# Patient Record
Sex: Female | Born: 1940 | Race: Black or African American | Hispanic: No | Smoking: Former smoker
Health system: Southern US, Community
[De-identification: ages and names within clinical notes are randomized; demographics above are authoritative.]

## PROBLEM LIST (undated history)

## (undated) DIAGNOSIS — G4730 Sleep apnea, unspecified: Secondary | ICD-10-CM

## (undated) DIAGNOSIS — J449 Chronic obstructive pulmonary disease, unspecified: Secondary | ICD-10-CM

## (undated) DIAGNOSIS — I5022 Chronic systolic (congestive) heart failure: Secondary | ICD-10-CM

## (undated) DIAGNOSIS — I428 Other cardiomyopathies: Secondary | ICD-10-CM

## (undated) DIAGNOSIS — G4733 Obstructive sleep apnea (adult) (pediatric): Secondary | ICD-10-CM

## (undated) DIAGNOSIS — D649 Anemia, unspecified: Secondary | ICD-10-CM

## (undated) DIAGNOSIS — I2510 Atherosclerotic heart disease of native coronary artery without angina pectoris: Secondary | ICD-10-CM

## (undated) DIAGNOSIS — E119 Type 2 diabetes mellitus without complications: Secondary | ICD-10-CM

## (undated) DIAGNOSIS — N184 Chronic kidney disease, stage 4 (severe): Secondary | ICD-10-CM

## (undated) DIAGNOSIS — Z95810 Presence of automatic (implantable) cardiac defibrillator: Secondary | ICD-10-CM

## (undated) DIAGNOSIS — Z9989 Dependence on other enabling machines and devices: Secondary | ICD-10-CM

## (undated) DIAGNOSIS — I272 Pulmonary hypertension, unspecified: Secondary | ICD-10-CM

## (undated) DIAGNOSIS — I509 Heart failure, unspecified: Secondary | ICD-10-CM

## (undated) DIAGNOSIS — E1142 Type 2 diabetes mellitus with diabetic polyneuropathy: Secondary | ICD-10-CM

## (undated) DIAGNOSIS — R0902 Hypoxemia: Secondary | ICD-10-CM

## (undated) DIAGNOSIS — I639 Cerebral infarction, unspecified: Secondary | ICD-10-CM

## (undated) DIAGNOSIS — I10 Essential (primary) hypertension: Secondary | ICD-10-CM

## (undated) DIAGNOSIS — H269 Unspecified cataract: Secondary | ICD-10-CM

## (undated) DIAGNOSIS — R0600 Dyspnea, unspecified: Secondary | ICD-10-CM

## (undated) DIAGNOSIS — C20 Malignant neoplasm of rectum: Secondary | ICD-10-CM

## (undated) DIAGNOSIS — N3941 Urge incontinence: Secondary | ICD-10-CM

## (undated) HISTORY — PX: COLONOSCOPY: SHX174

## (undated) HISTORY — DX: Hypoxemia: R09.02

## (undated) HISTORY — DX: Atherosclerotic heart disease of native coronary artery without angina pectoris: I25.10

## (undated) HISTORY — DX: Unspecified cataract: H26.9

## (undated) HISTORY — DX: Dependence on other enabling machines and devices: Z99.89

## (undated) HISTORY — DX: Type 2 diabetes mellitus with diabetic polyneuropathy: E11.42

## (undated) HISTORY — DX: Chronic obstructive pulmonary disease, unspecified: J44.9

## (undated) HISTORY — DX: Presence of automatic (implantable) cardiac defibrillator: Z95.810

## (undated) HISTORY — PX: TUBAL LIGATION: SHX77

## (undated) HISTORY — DX: Type 2 diabetes mellitus without complications: E11.9

## (undated) HISTORY — DX: Cerebral infarction, unspecified: I63.9

## (undated) HISTORY — PX: TONSILLECTOMY AND ADENOIDECTOMY: SUR1326

## (undated) HISTORY — PX: UPPER GASTROINTESTINAL ENDOSCOPY: SHX188

## (undated) HISTORY — DX: Sleep apnea, unspecified: G47.30

## (undated) HISTORY — DX: Essential (primary) hypertension: I10

## (undated) HISTORY — DX: Anemia, unspecified: D64.9

## (undated) HISTORY — DX: Other cardiomyopathies: I42.8

## (undated) HISTORY — DX: Heart failure, unspecified: I50.9

## (undated) HISTORY — DX: Obstructive sleep apnea (adult) (pediatric): G47.33

## (undated) HISTORY — DX: Malignant neoplasm of rectum: C20

## (undated) HISTORY — DX: Pulmonary hypertension, unspecified: I27.20

## (undated) HISTORY — DX: Chronic kidney disease, stage 4 (severe): N18.4

## (undated) HISTORY — DX: Urge incontinence: N39.41

## (undated) HISTORY — DX: Chronic systolic (congestive) heart failure: I50.22

---

## 2007-06-20 ENCOUNTER — Ambulatory Visit: Payer: Self-pay | Admitting: Emergency Medicine

## 2007-06-20 ENCOUNTER — Inpatient Hospital Stay (HOSPITAL_COMMUNITY): Admission: AD | Admit: 2007-06-20 | Discharge: 2007-07-03 | Payer: Self-pay | Admitting: *Deleted

## 2007-06-27 ENCOUNTER — Encounter (INDEPENDENT_AMBULATORY_CARE_PROVIDER_SITE_OTHER): Payer: Self-pay | Admitting: *Deleted

## 2007-06-27 ENCOUNTER — Ambulatory Visit: Payer: Self-pay | Admitting: Vascular Surgery

## 2007-06-28 ENCOUNTER — Ambulatory Visit: Payer: Self-pay | Admitting: Physical Medicine & Rehabilitation

## 2007-06-28 ENCOUNTER — Encounter: Payer: Self-pay | Admitting: Emergency Medicine

## 2007-07-03 ENCOUNTER — Inpatient Hospital Stay (HOSPITAL_COMMUNITY): Admission: RE | Admit: 2007-07-03 | Discharge: 2007-07-08 | Payer: Self-pay | Admitting: *Deleted

## 2007-07-12 ENCOUNTER — Telehealth (INDEPENDENT_AMBULATORY_CARE_PROVIDER_SITE_OTHER): Payer: Self-pay | Admitting: *Deleted

## 2007-07-26 ENCOUNTER — Encounter: Payer: Self-pay | Admitting: Critical Care Medicine

## 2007-07-26 ENCOUNTER — Ambulatory Visit: Payer: Self-pay | Admitting: Family Medicine

## 2007-07-26 ENCOUNTER — Ambulatory Visit: Payer: Self-pay | Admitting: Pulmonary Disease

## 2007-07-26 DIAGNOSIS — I428 Other cardiomyopathies: Secondary | ICD-10-CM | POA: Insufficient documentation

## 2007-07-26 DIAGNOSIS — F3289 Other specified depressive episodes: Secondary | ICD-10-CM | POA: Insufficient documentation

## 2007-07-26 DIAGNOSIS — I10 Essential (primary) hypertension: Secondary | ICD-10-CM

## 2007-07-26 DIAGNOSIS — K766 Portal hypertension: Secondary | ICD-10-CM

## 2007-07-26 DIAGNOSIS — I2721 Secondary pulmonary arterial hypertension: Secondary | ICD-10-CM | POA: Insufficient documentation

## 2007-07-26 DIAGNOSIS — J449 Chronic obstructive pulmonary disease, unspecified: Secondary | ICD-10-CM

## 2007-07-26 DIAGNOSIS — F329 Major depressive disorder, single episode, unspecified: Secondary | ICD-10-CM

## 2007-08-11 ENCOUNTER — Encounter
Admission: RE | Admit: 2007-08-11 | Discharge: 2007-08-12 | Payer: Self-pay | Admitting: Physical Medicine & Rehabilitation

## 2007-08-11 ENCOUNTER — Ambulatory Visit: Payer: Self-pay | Admitting: Physical Medicine & Rehabilitation

## 2007-08-26 ENCOUNTER — Ambulatory Visit: Payer: Self-pay | Admitting: Critical Care Medicine

## 2007-08-29 ENCOUNTER — Ambulatory Visit: Payer: Self-pay | Admitting: Family Medicine

## 2007-09-02 ENCOUNTER — Ambulatory Visit: Payer: Self-pay | Admitting: Family Medicine

## 2007-10-27 ENCOUNTER — Encounter: Payer: Self-pay | Admitting: Critical Care Medicine

## 2009-11-28 HISTORY — PX: UPPER GI ENDOSCOPY: SHX6162

## 2010-01-13 LAB — HM DEXA SCAN

## 2010-08-10 ENCOUNTER — Encounter: Payer: Self-pay | Admitting: Physical Medicine & Rehabilitation

## 2010-12-02 NOTE — Assessment & Plan Note (Signed)
[name] is back regarding her lacunar stroke.  She was discharged from  [facility] on December 19 and has done extremely well.  She has a sleep  study scheduled at the beginning of next month.  She has seen her  cardiologist and family physician already who have given her good  reports thus far.  She denies any pain or weakness.  She is walking with  a cane just used occasionally for balance.  She is sleeping well.  Her  appetite has been good.  Mood has been excellent.  Sugars have been  under good control as has blood pressure.   REVIEW OF SYSTEMS:  All pertinent positives listed above.  Full review  is in the written health and history section.   SOCIAL HISTORY:  The patient is now living with her daughter here in  [place].  She will be staying here permanently it seems.  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   PHYSICAL EXAMINATION:  Blood pressure:  145/63.  Pulse:  56.  Respiratory rate:  18.  She is sating 94% on room air.  Patient is  pleasant, alert and oriented x3.  Affect is bright and appropriate.  HEART:  Regular.  CHEST:  Clear.  ABDOMEN:  Soft, nontender.  She has a cane with her, but does not need it for balance.  She has good  walking balance as well as standing balance today.  Strength was equal  at 5/5 on both sides.  Coordination was excellent and within normal  limits bilaterally.  Sensory exam is intact.  She has good finger to  nose and rapid alternating movement of both hands.  SKIN:  Intact.   ASSESSMENT:  1. Lacunar stroke.  2. Congestive heart failure.  3. Diabetes.  4. Pulmonary hypertension and questionable sleep apnea.  5. Depression.  6. Hypertension.   PLAN:  1. Patient has done extremely well from a rehabilitation standpoint.      I have nothing new to offer her today as she is walking essentially      without assistive device.  She has no pain and feels that her      physical and mental status are  essentially at their baseline.  I      recommended continued exercise and healthy living habits.  She      would do      herself a lot of good by volunteering, as I think this would be      satisfying for her.  2. I will see her back as needed in the future.      Meredith Staggers, M.D.  Electronically Signed     ZTS/MedQ  D:  08/12/2007 13:21:31  T:  08/12/2007 15:13:40  Job #:  [id number]   cc:   Jill Alexanders, M.D.  Fax: [phone]   Octavia Heir, MD  Fax: [phone]

## 2010-12-02 NOTE — Discharge Summary (Signed)
NAMECLARIS, [name]               ACCOUNT NO.:  [id number]   MEDICAL RECORD NO.:  [id number]          PATIENT TYPE:  IPS   LOCATION:  9563                         FACILITY:  [facility]   PHYSICIAN:  Meredith Staggers, M.D.DATE OF BIRTH:  03-14-1941   DATE OF ADMISSION:  07/03/2007  DATE OF DISCHARGE:  07/08/2007                               DISCHARGE SUMMARY   DISCHARGE DIAGNOSIS:  1. Lacuna stroke.  2. Congestive heart failure compensated.  3. Diabetes mellitus type 2.  4. Cardiomyopathy.  5. Acute on chronic renal insufficiency.  6. Depression.  7. Hypertension.  8. Pulmonary hypertension.  [name]   HISTORY OF PRESENT ILLNESS:  [name] is a 70 year old female with a  history of hypertension and dilated nonischemic cardiomyopathy admitted  via outside hospital on December 1 for treatment of shortness of breath,  lower extremity edema due to CHF.  She was treated with IV diuretics  for acute and chronic CHF.  The patient was noted to have fluctuating  mental status with intermittent slurred speech and neuro was consulted  for input on December 5.  CT of head done showed no acute changes and  neuro felt patient with lacuna CVA and recommended Ecotrin for CVA  prophylaxis and Foltx for hyperhomocysteinemia. A swallow eval done  showed delay in swallowing.  The patient was placed on a regular diet  and nectar-thick liquids. A 2-D echo done showed mild aortic sclerosis,  normal EF and right ventricle to be mild to moderately dilated with  moderate to severe TVR.  Cardiac cath done on December 11 revealed  severe pulmonary hypertension.  The patient was noted to continue with  hypoxia and Dr. Joya Gaskins questioned obstructive sleep apnea in addition to  pulmonary hypertension being a component to her continued hypoxia. A  sleep study is recommended past discharge.  Currently PT, OT is ongoing.  The patient is noted to have problems with decreased control, noted to  have a narrow base of  support with scissoring with ambulation.  Rehab  consulted for further therapies.   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   ALLERGIES:  TALWIN.   FAMILY HISTORY:  Positive for CVA and CAD.   SOCIAL HISTORY:  The patient lives in [place], is a retired Visual merchandiser.  She quit tobacco in 2005, uses alcohol rarely. Plans are to  discharge to daughter's home in [place] that is a one-level home with  no steps at entry.  The patient was independent however, had decreased  endurance levels prior to admission.   HOSPITAL COURSE:  [name] was admitted to rehab on July 03, 2007 for inpatient therapies to consist of PT, OT daily.  At the  time of admission, she was noted to have generalized weakness with slow  precise speech.  Foley was discontinued prior to admission and the  patient was noted to be voiding without signs of retention.  UA, UC was  sent out and the patient was noted to  have bacteria.  She was recorded  problems with frequency, urgency and was started on amoxicillin  empirically.  Urine culture was negative.  Secondary to the patient's  symptoms, she was treated with 5-day course of antibiotic and short  course of peridium.  Overall urgency, frequency symptoms resolved.  Check of CBC past admission revealed hemoglobin 8.7, hematocrits 26.8,  white count 5.3, platelets 330.  Check of lytes revealed sodium 137,  potassium 4.1, chloride 97, CO2 29, BUN 21, creatinine 1.55, glucose  113.  Alkaline phosphatase noted to be elevated 98.  Iron studies done  revealed iron low at 43, TIBC at 310, folate greater than 20, vitamin  B12 at 868, ferritin 275. Retic at 1.4.  Repeat check of lytes revealed  some renal insufficiency with BUN at 27, creatinine 1.7.  Secondary to  renal insufficiency, her Glucophage continues to be on hold.  The   patient's blood sugars have been monitored on a.c. h.s. basis during  this stay. Amaryl 1 mg p.o. per day was initiated on December 18. In the  last 24 hours as blood sugars trending upwards in 150-170s range, the  patient's Amaryl increased to 2 mg p.o. per day at time of discharge.  The patient is instructed to check CBGs a.c. h.s. level with goal of  blood sugars at 80-100 range.  The patient's blood pressures have been  monitored on b.i.d. basis and these have ranged from 110 to occasional  high of 732 systolic, 20-25K diastolic.  Heart rate stable in 50-60s  range.  Last weight is at 85 kg.  The patient did have a repeat swallow  study done on December 17. The patient was advanced to regular  consistency,  thin liquids and is able to tolerate this without  difficulty.   The patient's mood has been stable. She has been participating well in  therapy.  Endurance level is slowly improving.  She continues with  hypoxia with activity with oxygen saturation dropping down to low 80s.  OT has worked with the patient on ADL training to include activity  tolerance for self-care as well as shower transfers.  She is currently  modified independent for static and dynamic standing balance.  She  requires min assist for tub transfers.  Physical therapy has been  working with the patient.  She is modified independent for basic  transfers. She is able to ambulate to 25 feet x2 with supervision with a  rolling walker, standing dynamic balance is at close supervision when  challenged.  She is able to navigate six stairs with min assist.  She  will continue with further follow-up therapies to include home health  PT, OT and RN by [facility].  On July 08, 2007, the patient  is discharged to home.   DISCHARGE MEDICATIONS:  1. BiDil 20/37.5 mg one p.o. q.8 h.  2. Colace 100 mg 2 p.o. q.h.s.  3. Coreg 12.5 mg b.i.d.  4. Ecotrin 325 mg per day.  5. Ferrous sulfate 325 mg b.i.d.  6. Foltx  one p.o. per day.  7. Lanoxin 0.125 mg half p.o. per day.  8. Lasix 40 mg b.i.d.  9. MiraLax 17 grams in 8 ounces fluid a day.  10.Prilosec OTC one per day as needed.  11.Zocor 20 mg q.h.s.  12.Spiriva 18 mcg 1 inhalation daily.  13.Amaryl 2 mg per day.  14.Ultram 50 mg a p.o. q. 6-8 h p.r.n. pain.  15.Tylenol as needed for pain.  16.Multivitamin daily.  DIET:  Diabetic diet, no added salt.   ACTIVITY LEVEL:  24-hour supervision initially, then intermittent  supervision as recommended by home therapies.   SPECIAL INSTRUCTIONS:  Use oxygen at all times.  Check blood sugars a.c.  h.s. and document. [facility] to provide PT, OT, RN.   FOLLOW UP:  The patient to follow up with Dr. Naaman Plummer January 22 at 1:20,  followup with Dr. Redmond School  on January 6 for routine check, set up  followup with Dr. Joya Gaskins in 2 weeks for input regarding set up of sleep  study on outpatient basis and followup with Dr. Leonie Man in 4 weeks.      Thornton Dales, P.A.      Meredith Staggers, M.D.  Electronically Signed    PP/MEDQ  D:  07/08/2007  T:  07/09/2007  Job:  [id number]   cc:   Jill Alexanders, M.D.  Octavia Heir, MD  Pramod P. Leonie Man, [place] Joya Gaskins, MD, FCCP

## 2010-12-02 NOTE — Discharge Summary (Signed)
[name], [name]               ACCOUNT NO.:  [id number]   MEDICAL RECORD NO.:  [id number]          PATIENT TYPE:  INP   LOCATION:  2915                         FACILITY:  [facility]   PHYSICIAN:  Octavia Heir, MD  DATE OF BIRTH:  1941-05-31   DATE OF ADMISSION:  06/20/2007  DATE OF DISCHARGE:  07/03/2007                               DISCHARGE SUMMARY   DISCHARGE DIAGNOSES:  1. Congestive heart failure, improved at discharge.  2. History of nonischemic cardiomyopathy, ejection fraction of 25% in      the past but preserved left ventricle function by echocardiogram      this admission.  3. Severe pulmonary hypertension, not felt to be a candidate for      Revatio after right heart catheterization this admission.  4. Insulin dependent diabetes.  5. History of prior implantable cardioverter defibrillator placement.  6. Renal insufficiency with a peak creatinine this admission of 2.2.  7. Left lacunar stroke this admission by MRI, not seen on CT scan.  8. 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  9. Chronic obstructive pulmonary disease and hypoventilation, severe.  10.Anemia, suspect chronic.  11.Hypertension.   HOSPITAL COURSE:  The patient is a 70 year old female who is the mother  of [name] nurse.  She has a history of nonischemic  cardiomyopathy.  She has a past EF of 25% and is status post ICD.  She  apparently was admitted to a [facility] with  dyspnea and then transferred to [facility].  This was on June 20, 2007.  She was admitted to CCU and started on IV diuretics.  She  improved but did not make much progress in the first 24-48 hours and IV  Natrecor was added.  She did bump her creatinine to 2.2, when she came  in her creatinine was in the 1.5 range.  We continued IV Natrecor and IV  diuretics with gradual diuresis and stabilization of her renal function.  The family noted some trouble with slurred speech.  CT scan was  negative.  She was seen in consult by the neurology service.  MRI did  reveal a left lacunar CVA.  Swallowing evaluation was done and the  patient does have a degree of dysphagia and will require some  modification and close supervision.  The patient remained hypoxic with  high CO2.  Repeat echocardiogram was done and revealed preserved LV  function.  Right heart catheterization was done on June 30, 2007, by  Dr. Tami Ribas which indicated severe pulmonary hypertension.  This was  reviewed by the pulmonary service.  They did not feel she was a  candidate for Revatio.  It is felt that the patient has severe COPD and  hypoventilation syndrome.  She has been anemic which we feel was  probably chronic and secondary to multiple labs.  She did have some  transient hematuria which resolved.  She was seen on July 03, 2007,  and felt to be stable to transfer to inpatient rehab.   MEDICATIONS AT TRANSFER:  1. Lanoxin 0.625 mg a day.  2. BiDil t.i.d.  3.  Coreg 12.5 mg b.i.d.  4. Zocor 20 mg a day.  5. Spiriva daily.  6. Aspirin 325 mg a day.  7. Protonix 40 mg a day.  8. Lasix 40 mg p.o. b.i.d.  9. Lovenox DVT prophylaxis.  10.Foltx daily.   LABS:  At discharge white count 5.5, hemoglobin 8.9, hematocrit 27.4,  platelets 333, sodium 134, potassium 4.5, BUN 23, creatinine 1.58, liver  functions are normal.  Urine culture from the 4th showed no growth.  BNP  on the 14th is 847.  Carotid ultrasound was negative for significant  stenosis.  Echocardiogram revealed overall normal LV function with mild  aortic valve sclerosis without stenosis.  The left atrium was mild to  moderately dilated.  Right ventricle was moderately dilated with marked  increase in RV pressure.  Chest x-ray on the 10th showed cardiomegaly  with basilar atelectasis.  INR 1.1 on the 11th.  Telemetry has been  sinus rhythm, sinus bradycardia.   DISPOSITION:  The patient is transferred to inpatient rehab for further   conditioning.  She will follow up with Dr. Tami Ribas as an outpatient.  She is not on an ACE inhibitor or an ARB because of her renal  insufficiency.      [name], P.A.      Octavia Heir, MD  Electronically Signed    LKK/MEDQ  D:  07/03/2007  T:  07/03/2007  Job:  [id number]

## 2010-12-02 NOTE — Consult Note (Signed)
[name], [name]               ACCOUNT NO.:  [id number]   MEDICAL RECORD NO.:  [id number]          PATIENT TYPE:  INP   LOCATION:  2915                         FACILITY:  [facility]   PHYSICIAN:  Shaune Pascal. Ch[place], M.D.DATE OF BIRTH:  04-07-1941   DATE OF CONSULTATION:  DATE OF DISCHARGE:                                 CONSULTATION   PERSONAL PHYSICIAN:  Dr. Tami Ribas and Dr. Claiborne Billings.   REASON FOR CONSULTATION:  Stroke/TIA.   HISTORY OF PRESENT ILLNESS:  The patient is a 70 year old African  American female with multiple medical problems who initially presented  on 06/20/2007 with dyspnea on exertion and bilaterally lower extremity  edema.  Since the admission the family states she has had intermittent  slurred speech, and the family also felt this morning she had right  facial droop, which improved.  The patient has been having fluctuating  episodes of sugar with hyperglycemic episodes since admission as per  notes.  The patient has been drowsy and lethargic since admission, as  well.  She denies any focal weakness or numbness, visual change,  swallowing problems, dizziness, vertigo or loss of consciousness.   PAST MEDICAL HISTORY:  Positive for hypertension, diabetes,  cardiomyopathy status post AICD.   CURRENT MEDICATIONS:  Include NovoLog, Protonix, digoxin, Coreg, BiDil,  Glucophage, Zocor, potassium chloride, baby aspirin and Lasix.   ALLERGIES:  PENTAZOCINE AND TALWIN.   FAMILY HISTORY:  Positive for CAD and stroke.   SOCIAL HISTORY:  The patient is widowed.  Currently lives alone.  She  quit tobacco in 2005.  Denies any alcohol use.   REVIEW OF SYSTEMS:  Positive as per HPI.  Review of systems negative as  per HPI in greater than 6 other organ systems.   EXAMINATION:  VITALS:  Temperature is 99.7, blood pressure is 99/39,  pulse is 69, respirations 19, O2 sat is 97%.  HEENT:  Normocephalic, atraumatic.  Extraocular muscles intact.  Visual  fields are intact.  NECK:   Supple.  Bases symmetric.  No carotid bruits are heard.  HEART:  Regular.  LUNGS:  Clear.  ABDOMEN:  Soft.  EXTREMITIES:  Show good pulses.  NEUROLOGICAL:  The patient is awake, alert and lethargic.  The patient  is following commands appropriately.  Language is dysarthric at times.  Cranial nerves II-XII are grossly intact.  Motor examination shows good  strength, 4+ to 5 strength in all 4 extremities and normal tone.  No  drift is noted.  Sensory examination is within normal.  Touch reflexes  are trace throughout.  Cerebellar function is within normal limits.  Finger to nose is not assessed secondary to safety.  CT of the head  showed no acute abnormalities, small vessel ischemia.   LABS:  WBC is 7.6, hemoglobin 9.7, hematocrit 30.4, platelets 332,  ammonia level is 23.  Sodium is 133, potassium is 5.4, chloride is 93,  CO2 is 35, BUN 33, creatinine is 1.81, glucose 141, alkaline phosphatase  is 151, total bilirubin 2.5.  Hemoglobin A1c is 7.6, calcium is 8.6, AST  is 19, ALT is 9.   IMPRESSION:  This  is a 70 year old African American female with  transient dysarthria and questionable right facial droop, questionable  transient ischemic attack versus small vessel stroke.  The patient is  not able to get an MRA secondary to her defibrillator.  We will repeat a  CT scan in the morning.  I will increase her baby aspirin to a full  adult aspirin.  I will check lipids, homocystine level, 2-D  echocardiogram and carotid Dopplers.  We will get physical therapy and  obtain a speech consult.  The family questions whether the patient  occasionally coughs while eating.  We will get a swallow evaluation, as  well.  The patient needs her lytes and metabolic derangements corrected.  We will continue current plan and treatment.  We will follow the patient  as consultants.      Shaune Pascal. Estella Husk, M.D.  Electronically Signed     DRC/MEDQ  D:  06/24/2007  T:  06/25/2007  Job:  [id number]

## 2010-12-02 NOTE — Cardiovascular Report (Signed)
NAMESHEMEKA, [name]               ACCOUNT NO.:  [id number]   MEDICAL RECORD NO.:  [id number]          PATIENT TYPE:  INP   LOCATION:  2915                         FACILITY:  [facility]   PHYSICIAN:  Octavia Heir, MD  DATE OF BIRTH:  1940/09/11   DATE OF PROCEDURE:  06/30/2007  DATE OF DISCHARGE:                            CARDIAC CATHETERIZATION   PROCEDURES:  Right heart catheterization.   COMPLICATIONS:  None.   INDICATIONS:  [name] is a 70 year old female with history of a  nonischemic cardiomyopathy, diabetes, hypertension, history of ICD  implant for primary prevention.  [name] was admitted to [facility]  June 20, 2007, after two days of the hospital in [place] with  heart failure.  [name] went on dobutamine with good diuresis.  However, [name]  continued to complain of shortness of breath and hypoxia.  This was  ultimately switched to Natrecor.  [name] continued to diurese well.  However, her pulmonary stats still remained marginal.  [name] is now  brought for cardiac catheterization to evaluate her right heart  pressures.   DESCRIPTION OF PROCEDURE:  After obtaining informed consent, the patient  went to the cardiac cath lab.  Right groin shaved, prepped and draped in  sterile fashion.  Monitoring was established.  Using modified Seldinger  technique, #7-French venous sheath was inserted into the right femoral  vein.  Under fluoroscopic guidance, a #7-French Swan-Ganz catheter  floated in the RA, RV, PA and wedge position.  Hemodynamic measures  obtained.   The RA 21, RV 69/11, PA 68/26, pulmonary capillary wedge pressures 31,  cardiac output 4.3, cardiac index 2.1, PA saturation 61%, AO saturation  95% on 2 liters.   CONCLUSIONS:  1. Elevated pulmonary capillary wedge pressure with severe pulmonary      hypertension.  2. Cardiac output 4.3, cardiac index 2.1.  3. PA saturation 61%, AO saturation 95% on 2 liters.      Octavia Heir, MD  Electronically  Signed     RHM/MEDQ  D:  06/30/2007  T:  06/30/2007  Job:  [id number]

## 2011-04-24 LAB — URINALYSIS, ROUTINE W REFLEX MICROSCOPIC
Ketones, ur: NEGATIVE
Nitrite: NEGATIVE

## 2011-04-24 LAB — COMPREHENSIVE METABOLIC PANEL
ALT: 10
Alkaline Phosphatase: 98
Calcium: 8.9
Chloride: 97
Potassium: 4.1
Total Bilirubin: 1.4 — ABNORMAL HIGH
Total Protein: 7.2

## 2011-04-24 LAB — URINE CULTURE
Colony Count: NO GROWTH
Culture: NO GROWTH
Special Requests: POSITIVE

## 2011-04-24 LAB — CBC
HCT: 26.8 — ABNORMAL LOW
Hemoglobin: 8.7 — ABNORMAL LOW
MCHC: 32.2
MCHC: 32.3
MCV: 83.7
Platelets: 330
RBC: 3.22 — ABNORMAL LOW
RBC: 3.22 — ABNORMAL LOW
WBC: 5.3

## 2011-04-24 LAB — BASIC METABOLIC PANEL
CO2: 30
Chloride: 100
Creatinine, Ser: 1.7 — ABNORMAL HIGH
GFR calc Af Amer: 36 — ABNORMAL LOW
Sodium: 137

## 2011-04-24 LAB — URINE MICROSCOPIC-ADD ON

## 2011-04-24 LAB — DIFFERENTIAL
Eosinophils Absolute: 0.2
Eosinophils Relative: 4
Neutro Abs: 3.5
Neutrophils Relative %: 67

## 2011-04-24 LAB — IRON AND TIBC: UIBC: 267

## 2011-04-24 LAB — FERRITIN: Ferritin: 275 (ref 10–291)

## 2011-04-24 LAB — RETICULOCYTES
RBC.: 3.43 — ABNORMAL LOW
Retic Count, Absolute: 48

## 2011-04-24 LAB — FOLATE: Folate: >20

## 2011-04-24 LAB — VITAMIN B12: Vitamin B-12: 968 — ABNORMAL HIGH (ref 211–911)

## 2011-04-27 LAB — PROTIME-INR
INR: 1.2
INR: 1.3
Prothrombin Time: 14.9
Prothrombin Time: 16.5 — ABNORMAL HIGH

## 2011-04-27 LAB — DIFFERENTIAL
Eosinophils Absolute: 0.3
Eosinophils Relative: 3
Lymphocytes Relative: 12
Lymphs Abs: 1
Monocytes Absolute: 1

## 2011-04-27 LAB — COMPREHENSIVE METABOLIC PANEL
ALT: 13
ALT: 9
AST: 21
AST: 24
Albumin: 2.8 — ABNORMAL LOW
Albumin: 2.9 — ABNORMAL LOW
Alkaline Phosphatase: 103
Alkaline Phosphatase: 121 — ABNORMAL HIGH
Alkaline Phosphatase: 92
BUN: 25 — ABNORMAL HIGH
BUN: 34 — ABNORMAL HIGH
Calcium: 8.6
Chloride: 94 — ABNORMAL LOW
Chloride: 95 — ABNORMAL LOW
Chloride: 95 — ABNORMAL LOW
GFR calc Af Amer: 27 — ABNORMAL LOW
GFR calc Af Amer: 32 — ABNORMAL LOW
GFR calc Af Amer: 33 — ABNORMAL LOW
Glucose, Bld: 141 — ABNORMAL HIGH
Glucose, Bld: 79
Potassium: 4.5
Potassium: 4.7
Potassium: 4.7
Sodium: 133 — ABNORMAL LOW
Sodium: 135
Total Bilirubin: 2.3 — ABNORMAL HIGH
Total Protein: 6.6
Total Protein: 7.1
Total Protein: 7.2

## 2011-04-27 LAB — BASIC METABOLIC PANEL
BUN: 23
BUN: 24 — ABNORMAL HIGH
BUN: 25 — ABNORMAL HIGH
BUN: 26 — ABNORMAL HIGH
BUN: 29 — ABNORMAL HIGH
BUN: 29 — ABNORMAL HIGH
BUN: 32 — ABNORMAL HIGH
BUN: 35 — ABNORMAL HIGH
BUN: 36 — ABNORMAL HIGH
BUN: 38 — ABNORMAL HIGH
CO2: 29
CO2: 32
CO2: 33 — ABNORMAL HIGH
CO2: 34 — ABNORMAL HIGH
CO2: 35 — ABNORMAL HIGH
CO2: 36 — ABNORMAL HIGH
CO2: 36 — ABNORMAL HIGH
Calcium: 8.3 — ABNORMAL LOW
Calcium: 8.4
Calcium: 8.8
Calcium: 8.8
Calcium: 9
Chloride: 100
Chloride: 92 — ABNORMAL LOW
Chloride: 93 — ABNORMAL LOW
Chloride: 93 — ABNORMAL LOW
Chloride: 93 — ABNORMAL LOW
Chloride: 96
Chloride: 98
Chloride: 98
Creatinine, Ser: 1.56 — ABNORMAL HIGH
Creatinine, Ser: 1.58 — ABNORMAL HIGH
Creatinine, Ser: 1.71 — ABNORMAL HIGH
Creatinine, Ser: 1.97 — ABNORMAL HIGH
Creatinine, Ser: 2.22 — ABNORMAL HIGH
Creatinine, Ser: 2.32 — ABNORMAL HIGH
GFR calc Af Amer: 36 — ABNORMAL LOW
GFR calc Af Amer: 39 — ABNORMAL LOW
GFR calc non Af Amer: 22 — ABNORMAL LOW
GFR calc non Af Amer: 22 — ABNORMAL LOW
GFR calc non Af Amer: 23 — ABNORMAL LOW
GFR calc non Af Amer: 32 — ABNORMAL LOW
GFR calc non Af Amer: 33 — ABNORMAL LOW
Glucose, Bld: 118 — ABNORMAL HIGH
Glucose, Bld: 118 — ABNORMAL HIGH
Glucose, Bld: 126 — ABNORMAL HIGH
Glucose, Bld: 147 — ABNORMAL HIGH
Glucose, Bld: 213 — ABNORMAL HIGH
Glucose, Bld: 53 — ABNORMAL LOW
Glucose, Bld: 57 — ABNORMAL LOW
Glucose, Bld: 71
Glucose, Bld: 80
Glucose, Bld: 96
Potassium: 4.2
Potassium: 4.2
Potassium: 4.4
Potassium: 4.5
Potassium: 4.6
Potassium: 5
Potassium: 5.1
Potassium: 5.2 — ABNORMAL HIGH
Sodium: 133 — ABNORMAL LOW
Sodium: 134 — ABNORMAL LOW
Sodium: 136

## 2011-04-27 LAB — BLOOD GAS, ARTERIAL
Acid-Base Excess: 8.8 — ABNORMAL HIGH
Acid-Base Excess: 9.5 — ABNORMAL HIGH
Bicarbonate: 32.9 — ABNORMAL HIGH
Bicarbonate: 33.5 — ABNORMAL HIGH
Bicarbonate: 34.4 — ABNORMAL HIGH
O2 Content: 0.4
O2 Saturation: 91.7
O2 Saturation: 96.2
Patient temperature: 98
Patient temperature: 98.6
Patient temperature: 98.9
TCO2: 35.1
TCO2: 36.2
pCO2 arterial: 51.6 — ABNORMAL HIGH
pH, Arterial: 7.421 — ABNORMAL HIGH
pO2, Arterial: 81.5

## 2011-04-27 LAB — CBC
HCT: 27.1 — ABNORMAL LOW
HCT: 28 — ABNORMAL LOW
HCT: 29.2 — ABNORMAL LOW
HCT: 29.8 — ABNORMAL LOW
HCT: 30.1 — ABNORMAL LOW
HCT: 30.7 — ABNORMAL LOW
HCT: 31.5 — ABNORMAL LOW
HCT: 31.5 — ABNORMAL LOW
HCT: 32.3 — ABNORMAL LOW
Hemoglobin: 10 — ABNORMAL LOW
Hemoglobin: 10 — ABNORMAL LOW
Hemoglobin: 10.6 — ABNORMAL LOW
Hemoglobin: 9.4 — ABNORMAL LOW
MCHC: 31.9
MCHC: 32.1
MCHC: 32.2
MCHC: 32.2
MCHC: 32.3
MCHC: 32.5
MCHC: 32.5
MCV: 81.5
MCV: 82.9
MCV: 83.6
MCV: 83.8
MCV: 83.9
MCV: 84.1
MCV: 84.3
Platelets: 307
Platelets: 319
Platelets: 320
Platelets: 323
Platelets: 328
Platelets: 332
Platelets: 333
Platelets: 344
Platelets: 372
Platelets: 377
RBC: 3.24 — ABNORMAL LOW
RBC: 3.79 — ABNORMAL LOW
RDW: 18.2 — ABNORMAL HIGH
RDW: 18.5 — ABNORMAL HIGH
RDW: 18.8 — ABNORMAL HIGH
RDW: 19.2 — ABNORMAL HIGH
RDW: 19.2 — ABNORMAL HIGH
RDW: 19.4 — ABNORMAL HIGH
RDW: 19.4 — ABNORMAL HIGH
RDW: 19.4 — ABNORMAL HIGH
RDW: 19.9 — ABNORMAL HIGH
RDW: 20 — ABNORMAL HIGH
RDW: 20.1 — ABNORMAL HIGH
RDW: 20.1 — ABNORMAL HIGH
WBC: 5
WBC: 5.5
WBC: 6.1
WBC: 6.9
WBC: 7.6
WBC: 8.2

## 2011-04-27 LAB — AMMONIA: Ammonia: 23

## 2011-04-27 LAB — CROSSMATCH

## 2011-04-27 LAB — POCT I-STAT 3, VENOUS BLOOD GAS (G3P V)
Bicarbonate: 30 — ABNORMAL HIGH
TCO2: 31
pH, Ven: 7.403 — ABNORMAL HIGH

## 2011-04-27 LAB — CARDIAC PANEL(CRET KIN+CKTOT+MB+TROPI): Relative Index: 0.9

## 2011-04-27 LAB — HEMOGLOBIN AND HEMATOCRIT, BLOOD: Hemoglobin: 9.4 — ABNORMAL LOW

## 2011-04-27 LAB — URINALYSIS, ROUTINE W REFLEX MICROSCOPIC
Bilirubin Urine: NEGATIVE
Bilirubin Urine: NEGATIVE
Ketones, ur: NEGATIVE
Nitrite: NEGATIVE
Nitrite: NEGATIVE
Protein, ur: 30 — AB
Specific Gravity, Urine: 1.008
Urobilinogen, UA: 4 — ABNORMAL HIGH
Urobilinogen, UA: 4 — ABNORMAL HIGH
pH: 7.5
pH: 7.5

## 2011-04-27 LAB — DIGOXIN LEVEL: Digoxin Level: 0.4 — ABNORMAL LOW

## 2011-04-27 LAB — URINE MICROSCOPIC-ADD ON

## 2011-04-27 LAB — APTT: aPTT: 37

## 2011-04-27 LAB — LIPID PANEL
Cholesterol: 87
HDL: 38 — ABNORMAL LOW
LDL Cholesterol: 38
Total CHOL/HDL Ratio: 2.3
Triglycerides: 55

## 2011-04-27 LAB — HEMOGLOBIN A1C: Mean Plasma Glucose: 193

## 2011-04-27 LAB — URINE CULTURE

## 2011-04-27 LAB — B-NATRIURETIC PEPTIDE (CONVERTED LAB)
Pro B Natriuretic peptide (BNP): 847 — ABNORMAL HIGH
Pro B Natriuretic peptide (BNP): 911 — ABNORMAL HIGH

## 2012-04-29 DIAGNOSIS — Z4502 Encounter for adjustment and management of automatic implantable cardiac defibrillator: Secondary | ICD-10-CM | POA: Insufficient documentation

## 2012-04-29 DIAGNOSIS — I420 Dilated cardiomyopathy: Secondary | ICD-10-CM | POA: Insufficient documentation

## 2014-01-09 DIAGNOSIS — R0789 Other chest pain: Secondary | ICD-10-CM | POA: Insufficient documentation

## 2014-01-09 DIAGNOSIS — S161XXA Strain of muscle, fascia and tendon at neck level, initial encounter: Secondary | ICD-10-CM | POA: Insufficient documentation

## 2014-01-09 DIAGNOSIS — M542 Cervicalgia: Secondary | ICD-10-CM | POA: Insufficient documentation

## 2014-01-09 HISTORY — DX: Cervicalgia: M54.2

## 2014-01-09 HISTORY — DX: Strain of muscle, fascia and tendon at neck level, initial encounter: S16.1XXA

## 2014-01-09 HISTORY — DX: Other chest pain: R07.89

## 2014-06-13 DIAGNOSIS — Z6833 Body mass index (BMI) 33.0-33.9, adult: Secondary | ICD-10-CM | POA: Insufficient documentation

## 2017-02-22 LAB — HM MAMMOGRAPHY

## 2017-05-04 DIAGNOSIS — J984 Other disorders of lung: Secondary | ICD-10-CM | POA: Insufficient documentation

## 2017-05-04 LAB — PULMONARY FUNCTION TEST

## 2017-05-07 LAB — HEMOGLOBIN A1C: HEMOGLOBIN A1C: 6.8

## 2017-08-26 ENCOUNTER — Ambulatory Visit: Payer: Self-pay | Admitting: Cardiovascular Disease

## 2017-09-01 ENCOUNTER — Ambulatory Visit: Payer: Medicare PPO | Admitting: Family Medicine

## 2017-09-01 ENCOUNTER — Encounter: Payer: Self-pay | Admitting: Family Medicine

## 2017-09-01 VITALS — BP 150/90 | HR 64 | Wt 180.8 lb

## 2017-09-01 DIAGNOSIS — J449 Chronic obstructive pulmonary disease, unspecified: Secondary | ICD-10-CM

## 2017-09-01 DIAGNOSIS — I10 Essential (primary) hypertension: Secondary | ICD-10-CM

## 2017-09-01 DIAGNOSIS — E119 Type 2 diabetes mellitus without complications: Secondary | ICD-10-CM

## 2017-09-01 DIAGNOSIS — I509 Heart failure, unspecified: Secondary | ICD-10-CM

## 2017-09-01 DIAGNOSIS — Z9989 Dependence on other enabling machines and devices: Secondary | ICD-10-CM

## 2017-09-01 DIAGNOSIS — Z5181 Encounter for therapeutic drug level monitoring: Secondary | ICD-10-CM

## 2017-09-01 DIAGNOSIS — Z8639 Personal history of other endocrine, nutritional and metabolic disease: Secondary | ICD-10-CM | POA: Diagnosis not present

## 2017-09-01 DIAGNOSIS — G4733 Obstructive sleep apnea (adult) (pediatric): Secondary | ICD-10-CM

## 2017-09-01 DIAGNOSIS — Z79899 Other long term (current) drug therapy: Secondary | ICD-10-CM | POA: Diagnosis not present

## 2017-09-01 DIAGNOSIS — N189 Chronic kidney disease, unspecified: Secondary | ICD-10-CM

## 2017-09-01 NOTE — Patient Instructions (Signed)
Keep an eye on your BP at home and if you are not seeing readings <130/80 on a consistent basis then let me or your cardiologist know about this.   We will call you with your lab results.   I will see you back in 4 weeks or sooner if needed.

## 2017-09-01 NOTE — Progress Notes (Signed)
Subjective:    Patient ID: [name], female    DOB: February 09, 1941, 77 y.o.   MRN: [id number]  HPI Chief Complaint  Patient presents with  . new pt    new pt get established no concerns   She is new to the practice and here to establish care.  Previous medical care: [place], [place]  She had several providers in [place]. States she had a pulmonologist, cardiologist, podiatrist and urologist.  Reports intermittent diarrhea for the past several months. This is occurring only after eating spicy foods and this is typically once a month.  States she has a history of OAB and was taking Myrbetriq 50 mg but it was too expensive.   Defibrillator since 2010  Diabetes type 2 diagnosed 5-6 years ago. She is only on glimepride.  CHF- diagnosed in 2008-2009  Dig level last checked in October and it was low per daughter. Last took digoxin 10 am yesterday.    OSA on CPAP. Using this nightly.     HTN- has not taken her medication today. Does not check BP at home.    Last October 2018  Other providers: Dr. [name] next week   Colonoscopy many years ago.   ?TIA in the past per daughter.   Denies fever, chills, dizziness, chest pain, palpitations, shortness of breath, abdominal pain, N/V/D, urinary symptoms, LE edema.    Reviewed allergies, medications, past medical, surgical, family, and social history.    Review of Systems Pertinent positives and negatives in the history of present illness.     Objective:   Physical Exam BP (!) 150/90   Pulse 64   Wt 180 lb 12.8 oz (82 kg)  Alert and in no distress. Tympanic membranes and canals are normal. Pharyngeal area is normal. Neck is supple without adenopathy or thyromegaly. Cardiac exam shows a regular sinus rhythm without murmurs or gallops. Lungs are clear to auscultation with a normal work of breathing.  Abdomen soft, non distended, normal BS, non tender, no rebound or guarding. Extremities with normal pulses, no edema. PERRLA, CN  II-IX intact, skin is warm and dry, no pallor.       Assessment & Plan:  Essential hypertension - Plan: CBC with Differential/Platelet, Comprehensive metabolic panel  OSA on CPAP  Congestive heart failure, unspecified HF chronicity, unspecified heart failure type ([place]) - Plan: CBC with Differential/Platelet, Comprehensive metabolic panel, Lipid panel  History of hyperlipidemia - Plan: Lipid panel  Encounter for monitoring digoxin therapy - Plan: Digoxin level  Chronic obstructive pulmonary disease, unspecified COPD type ([place])  Chronic kidney disease, unspecified CKD stage - Plan: Comprehensive metabolic panel  Type 2 diabetes mellitus without complication, without long-term current use of insulin ([facility]) - Plan: CBC with Differential/Platelet, Comprehensive metabolic panel, Hemoglobin A1c  Medication management - Plan: Digoxin level  She and her daughter are pleasant. Her daughter is a Marine scientist at [employer] and states patient is schedule to establish with a cardiologist next week.  She will continue on all medications for now.  HTN- BP is not at goal but she did not take her BP medication this morning. She will check this at home and follow up with her readings.  Will check a digoxin level.  CHF- she is keeping an eye on daily weights and salt intake.  Consider referral to pulmonologist. Appears to be compliant with CPAP and benefiting.  Diabetes is well controlled with last Hgb A1c <7% in October 2018. I will recheck this today.  One month samples of Myrbetriq  given.  Follow up pending labs or in 4 weeks.  Spent 45 minutes face to face with patient and at least 50% was in counseling and coordination of care.

## 2017-09-02 LAB — CBC WITH DIFFERENTIAL/PLATELET
BASOS: 0 %
Basophils Absolute: 0 10*3/uL (ref 0.0–0.2)
EOS (ABSOLUTE): 0.1 10*3/uL (ref 0.0–0.4)
Eos: 2 %
Hematocrit: 35 % (ref 34.0–46.6)
Hemoglobin: 11.1 g/dL (ref 11.1–15.9)
Immature Grans (Abs): 0 10*3/uL (ref 0.0–0.1)
Immature Granulocytes: 0 %
Lymphocytes Absolute: 1.3 10*3/uL (ref 0.7–3.1)
Lymphs: 29 %
MCH: 27.5 pg (ref 26.6–33.0)
MCHC: 31.7 g/dL (ref 31.5–35.7)
MCV: 87 fL (ref 79–97)
MONOS ABS: 0.6 10*3/uL (ref 0.1–0.9)
Monocytes: 14 %
NEUTROS ABS: 2.4 10*3/uL (ref 1.4–7.0)
NEUTROS PCT: 55 %
PLATELETS: 234 10*3/uL (ref 150–379)
RBC: 4.03 x10E6/uL (ref 3.77–5.28)
RDW: 16 % — AB (ref 12.3–15.4)
WBC: 4.5 10*3/uL (ref 3.4–10.8)

## 2017-09-02 LAB — COMPREHENSIVE METABOLIC PANEL
A/G RATIO: 1.3 (ref 1.2–2.2)
ALT: 10 IU/L (ref 0–32)
AST: 20 IU/L (ref 0–40)
Albumin: 4.7 g/dL (ref 3.5–4.8)
Alkaline Phosphatase: 117 IU/L (ref 39–117)
BILIRUBIN TOTAL: 0.6 mg/dL (ref 0.0–1.2)
BUN/Creatinine Ratio: 21 (ref 12–28)
BUN: 40 mg/dL — ABNORMAL HIGH (ref 8–27)
CHLORIDE: 105 mmol/L (ref 96–106)
CO2: 23 mmol/L (ref 20–29)
Calcium: 9.6 mg/dL (ref 8.7–10.3)
Creatinine, Ser: 1.9 mg/dL — ABNORMAL HIGH (ref 0.57–1.00)
GFR calc Af Amer: 28 mL/min/{1.73_m2} — ABNORMAL LOW (ref >59)
GFR calc non Af Amer: 25 mL/min/{1.73_m2} — ABNORMAL LOW (ref >59)
Globulin, Total: 3.6 g/dL (ref 1.5–4.5)
Glucose: 74 mg/dL (ref 65–99)
POTASSIUM: 5.6 mmol/L — AB (ref 3.5–5.2)
Sodium: 143 mmol/L (ref 134–144)
Total Protein: 8.3 g/dL (ref 6.0–8.5)

## 2017-09-02 LAB — LIPID PANEL
Chol/HDL Ratio: 1.6 ratio (ref 0.0–4.4)
Cholesterol, Total: 143 mg/dL (ref 100–199)
HDL: 92 mg/dL (ref >39)
LDL Calculated: 41 mg/dL (ref 0–99)
TRIGLYCERIDES: 49 mg/dL (ref 0–149)
VLDL Cholesterol Cal: 10 mg/dL (ref 5–40)

## 2017-09-02 LAB — HEMOGLOBIN A1C
Est. average glucose Bld gHb Est-mCnc: 117 mg/dL
Hgb A1c MFr Bld: 5.7 % — ABNORMAL HIGH (ref 4.8–5.6)

## 2017-09-02 LAB — DIGOXIN LEVEL: Digoxin, Serum: 0.4 ng/mL — ABNORMAL LOW (ref 0.5–0.9)

## 2017-09-06 ENCOUNTER — Other Ambulatory Visit: Payer: Self-pay | Admitting: Family Medicine

## 2017-09-06 ENCOUNTER — Encounter: Payer: Self-pay | Admitting: Family Medicine

## 2017-09-06 DIAGNOSIS — N189 Chronic kidney disease, unspecified: Secondary | ICD-10-CM | POA: Insufficient documentation

## 2017-09-07 ENCOUNTER — Encounter: Payer: Self-pay | Admitting: Family Medicine

## 2017-09-08 ENCOUNTER — Encounter: Payer: Self-pay | Admitting: Gastroenterology

## 2017-09-08 ENCOUNTER — Encounter: Payer: Self-pay | Admitting: Family Medicine

## 2017-09-08 ENCOUNTER — Telehealth: Payer: Self-pay

## 2017-09-08 ENCOUNTER — Other Ambulatory Visit: Payer: Self-pay | Admitting: Family Medicine

## 2017-09-08 ENCOUNTER — Ambulatory Visit: Payer: Medicare PPO | Admitting: Cardiovascular Disease

## 2017-09-08 VITALS — BP 138/58 | HR 50 | Ht 64.0 in | Wt 185.0 lb

## 2017-09-08 DIAGNOSIS — I471 Supraventricular tachycardia: Secondary | ICD-10-CM | POA: Diagnosis not present

## 2017-09-08 DIAGNOSIS — N184 Chronic kidney disease, stage 4 (severe): Secondary | ICD-10-CM

## 2017-09-08 DIAGNOSIS — J449 Chronic obstructive pulmonary disease, unspecified: Secondary | ICD-10-CM

## 2017-09-08 DIAGNOSIS — I5022 Chronic systolic (congestive) heart failure: Secondary | ICD-10-CM | POA: Diagnosis not present

## 2017-09-08 DIAGNOSIS — G4733 Obstructive sleep apnea (adult) (pediatric): Secondary | ICD-10-CM

## 2017-09-08 DIAGNOSIS — C20 Malignant neoplasm of rectum: Secondary | ICD-10-CM

## 2017-09-08 DIAGNOSIS — Z95810 Presence of automatic (implantable) cardiac defibrillator: Secondary | ICD-10-CM | POA: Diagnosis not present

## 2017-09-08 DIAGNOSIS — I428 Other cardiomyopathies: Secondary | ICD-10-CM | POA: Insufficient documentation

## 2017-09-08 DIAGNOSIS — E1142 Type 2 diabetes mellitus with diabetic polyneuropathy: Secondary | ICD-10-CM | POA: Diagnosis not present

## 2017-09-08 DIAGNOSIS — Z9989 Dependence on other enabling machines and devices: Secondary | ICD-10-CM

## 2017-09-08 DIAGNOSIS — I10 Essential (primary) hypertension: Secondary | ICD-10-CM | POA: Diagnosis not present

## 2017-09-08 DIAGNOSIS — I2721 Secondary pulmonary arterial hypertension: Secondary | ICD-10-CM | POA: Diagnosis not present

## 2017-09-08 DIAGNOSIS — E1169 Type 2 diabetes mellitus with other specified complication: Secondary | ICD-10-CM | POA: Insufficient documentation

## 2017-09-08 DIAGNOSIS — I5042 Chronic combined systolic (congestive) and diastolic (congestive) heart failure: Secondary | ICD-10-CM | POA: Insufficient documentation

## 2017-09-08 DIAGNOSIS — E7800 Pure hypercholesterolemia, unspecified: Secondary | ICD-10-CM

## 2017-09-08 DIAGNOSIS — I2510 Atherosclerotic heart disease of native coronary artery without angina pectoris: Secondary | ICD-10-CM | POA: Diagnosis not present

## 2017-09-08 DIAGNOSIS — N3941 Urge incontinence: Secondary | ICD-10-CM | POA: Insufficient documentation

## 2017-09-08 HISTORY — DX: Obstructive sleep apnea (adult) (pediatric): G47.33

## 2017-09-08 HISTORY — DX: Dependence on other enabling machines and devices: Z99.89

## 2017-09-08 HISTORY — DX: Malignant neoplasm of rectum: C20

## 2017-09-08 NOTE — Progress Notes (Signed)
Cardiology Office Note:    Date:  09/09/2017   ID:  [name], DOB 1941-03-10, MRN [id number]  PCP:  Girtha Rm, NP-C  Cardiologist:  Sanda Klein, MD   Referring MD: Mila Palmer, PA-C   Chief complaint: Establish cardiology follow-up after moving to [place] is a 77 y.o. female who is being seen today for the evaluation of congestive heart failure, pulmonary heart retention, defibrillator check at the request of Mila Palmer, PA-C.  She will be establishing follow-up with Harland Dingwall, NP-C.   History of Present Illness:    [name] is a 77 y.o. female with a hx of nonischemic cardiomyopathy (previous EF 35%, most recent EF 45%), minor nonobstructive coronary artery disease (last heart cath December 2015), status post defibrillator (Medtronic Virgie dual-chamber, implanted 2013, Sprint [place] 6949-lead under advisory) severe pulmonary hypertension, obstructive sleep apnea, restrictive lung disease, essential hypertension, hypercholesterolemia type 2 diabetes mellitus, which is currently diet controlled.  She has recently relocated from [place] to [place] to live close to her daughter, [name].  [name] accompanies the patient to her office visit today and she is a cardiology nurse in our practice. 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  As listed above she has numerous cardiac and general medical problems all of which appear to be recently quite stable.    [name] is quite sedentary.  Her device only shows roughly 14 minutes of activity per day.  It is therefore very difficult to assess her functional status.  For what it is worth, she denies any problems with exertional dyspnea or other exertional complaints.  She does have problems with lower extremity edema but she self manages this by adjusting her dose of diuretic.  If she develops weight gain and  ankle swelling she increases her dose of furosemide from 40 mg daily to 80 mg daily.  She only takes the diuretic 6 days a week.  She has to increase the dose of diuretic to the higher dose no more than twice a week on the average.  She is compliant with daily weights and is quite knowledgeable about sodium restriction.  She estimates her "dry weight" on her home scale at about 180 pounds, which is what she weighed today (our office scale shows 5 pounds more).  She denies orthopnea or PND and has not been troubled by palpitations or syncope or defibrillator discharges.  As far as I can tell she has never received therapy from her defibrillator.  She denies headaches, focal neurological complaints, bleeding problems or intermittent claudication.  She has a prescription for oxygen but is here in the office today without the oxygen and appears quite comfortable.  Her most recent echocardiogram was performed in October 2016 and showed a left ventricular ejection fraction of 45% with a mildly dilated left ventricle measuring 5.3 cm in end diastole.  Mitral inflow showed a pattern of abnormal relaxation, consistent with the absence of elevated mean left atrial pressure, the E/e' prime ratio at the medial annulus was however elevated at 13.  The same echocardiogram also showed mild mitral insufficiency and mild to moderate tricuspid insufficiency and a mildly dilated left atrium.  The right atrium and right ventricle was described as normal.  There is a mention of mild aortic stenosis with a valve area calculated at 1.8 cm, but the gradients are quite normal (peak 10, mean 6 mmHg).  The most  striking abnormality on the echo is severe pulmonary artery pressure with an estimated systolic pressure of 70 mmHg assuming a right atrial pressure of 7 mmHg.  There are no objective measurements to assess right ventricular systolic function but qualitatively  the right ventricle is described as normal.    Her last cardiac  catheterization was in December 2015 and described "mild plaquing and no obstructive CAD" in the left main and circumflex coronary artery, while the LAD and RCA were described as normal.  At that time the EF was 35-45% by ventriculography in the left ventricular end-diastolic pressure was 30 mmHg.  The mean pulmonary artery pressure was 25 mmHg.  The pulmonary artery pressure was 90/23 mmHg (with systemic blood pressure 168/66).  Carotid duplex ultrasound performed in October 2017 showed minor plaque in both carotids and antegrade flow in both vertebral arteries.  Comprehensive interrogation of her defibrillator today shows normal function.  Estimated generator longevity is 5.5 years.  The device has never delivered therapies for tachycardia.  She has 45% atrial pacing and only 0.4% ventricular pacing.  Mode switch has occur less than 0.1% of the time.  Since January 2018 she has had a grand total of 14 minutes of atrial mode switch.  Review of the electrogram showed these events to primarily be paroxysmal atrial tachycardia.  This is usually conducted with some degree of AV block so that there is now high ventricular rate.  There is one exception where she had 1: 1 AV conduction, which was misinterpreted by her device is representing nonsustained ventricular tachycardia.  Lead parameters are good as follows: Atrial lead impedance 380 ohms, threshold 0.65 V at 0.4 ms, sensed P wave 0.6 mV.  Ventricular lead impedance 494 ohms (high-voltage 36 ohms/SVC 48 ohms), threshold 0.5 V at 0.4 ms pulse width, sensed R waves 3.8 mV.  As mentioned her ventricular lead is a Sprint Fidelis [phone] under advisory but shows no evidence of SIC.  Her cardiologist in [place] for many years was Dr. Elberta Spaniel, most recently Dr. Barnie Alderman  She bears a diagnosis of restrictive lung disease and obstructive sleep apnea.  She is supposed to be on home oxygen.  She uses CPAP for severe obstructive sleep apnea with an apnea  hypotony index of 59 and desaturations down to 80%.  Despite treatment with CPAP it has been very difficult to control her AHI.  She plans to follow-up with Dr. Halford Chessman or with Dr. Elsworth Soho in the pulmonary clinic.  The report stated that prior PFTs showed no obstruction but fairly significant restriction and reduced DLCO but repeat PFTs today were improved, CT chest was unremarkable and the feeling was that her initial PFTs may have not been accurate.  Most recent hemoglobin A1c is documented as being 6.1%, on glimepiride monotherapy.  She is not on statin therapy, but her previous notes report that she was taking atorvastatin 40 mg daily and her LDL was 36.[name]  Her baseline creatinine seems to be 1.6-1.9.  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  She had a pulmonary embolism 40 years ago.  EGD performed in 2011 showed mild erosive esophagitis and gastritis.  She was prescribed Protonix.  She has a remote history of depression for which she is not currently receiving any medications.  She has never smoked and denies use of alcohol.    Recent labs are significant for a digoxin level of less than 0.4, BUN 22, creatinine 1.6, glucose 138, cholesterol 127, HDL 83,  LDL 36, triglycerides 42, hemoglobin A1c 6.8%, hemoglobin 11.6, platelets 220 3K.  These labs were all performed in October 2018  Past Medical History:  Diagnosis Date  . CAD (coronary artery disease)   . Cardiac defibrillator in situ   . Chronic systolic heart failure ([place])   . CKD (chronic kidney disease) stage 4, GFR 15-29 ml/min (HCC)   . HTN (hypertension)   . Malignant neoplasm of rectum ([place]) 09/08/2017   Dr. Rollene Rotunda in [place]. Per notes she was due for colonoscopy in 12/2016  . OSA on CPAP 09/08/2017  . Other cardiomyopathies ([place])   . Pulmonary HTN ([place])   . Type 2 diabetes, controlled, with peripheral neuropathy ([place])   . Urge incontinence of urine     Past Surgical History:  Procedure Laterality Date   . UPPER GI ENDOSCOPY  11/28/2009   mild erosive esophagitis, mild erosive changes in stomach, chronic constipation    Current Medications: Current Meds  Medication Sig  . amLODipine-valsartan (EXFORGE) 10-160 MG tablet Take 1 tablet by mouth daily.  [name] aspirin 325 MG tablet Take 325 mg by mouth daily.  . carvedilol (COREG) 25 MG tablet Take 25 mg by mouth 2 (two) times daily with a meal.  . DIGOX 125 MCG tablet Take 1 tablet by mouth daily.  . furosemide (LASIX) 40 MG tablet Take 80 mg by mouth 2 (two) times daily.  [name] glimepiride (AMARYL) 2 MG tablet Take 4 mg by mouth daily.  . mirabegron ER (MYRBETRIQ) 50 MG TB24 tablet Take 50 mg by mouth daily.  . OXYGEN Inhale into the lungs.  [name] spironolactone (ALDACTONE) 25 MG tablet Take 25 mg by mouth daily.  [name] terazosin (HYTRIN) 2 MG capsule Take 2 mg by mouth at bedtime.     Allergies:   Amlodipine besy-benazepril hcl and Talwin [pentazocine]   Social History   Socioeconomic History  . Marital status: Widowed    Spouse name: None  . Number of children: None  . Years of education: None  . Highest education level: None  Social Needs  . Financial resource strain: None  . Food insecurity - worry: None  . Food insecurity - inability: None  . Transportation needs - medical: None  . Transportation needs - non-medical: None  Occupational History  . None  Tobacco Use  . Smoking status: Former Research scientist (life sciences)  . Smokeless tobacco: Never Used  Substance and Sexual Activity  . Alcohol use: No    Frequency: Never  . Drug use: No  . Sexual activity: None  Other Topics Concern  . None  Social History Narrative  . None     Family History: The patient's family history is significant for a history of coronary artery disease at advanced ages.  And stroke at advanced ages ROS:   Please see the history of present illness.     All other systems reviewed and are negative.  EKGs/Labs/Other Studies Reviewed:    The following studies were reviewed  today: Numerous studies including cardiac cath, echo, pulmonary function test, labs, office notes from cardiology/pulmonology/internal medicine from [facility]  EKG:  EKG is ordered today.  The ekg ordered today demonstrates atrial paced, ventricular sensed rhythm at 50 bpm with a long AV delay (232 ms) and narrow QRS 92 ms.  There are minor ST-T changes in V4-V6 and the QT interval is borderline at 450 ms.  Interpretation is limited by baseline artifact due to tremor.  Recent Labs: 09/01/2017: ALT 10; BUN 40; Creatinine, Ser 1.90; Hemoglobin  11.1; Platelets 234; Potassium 5.6; Sodium 143  Recent Lipid Panel    Component Value Date/Time   CHOL 143 09/01/2017 1512   TRIG 49 09/01/2017 1512   HDL 92 09/01/2017 1512   CHOLHDL 1.6 09/01/2017 1512   CHOLHDL 2.3 06/25/2007 0700   VLDL 11 06/25/2007 0700   LDLCALC 41 09/01/2017 1512    Physical Exam:    VS:  BP (!) 138/58   Pulse (!) 50   Ht _0  (1.626 m)   Wt 185 lb (83.9 kg)   BMI 31.76 kg/m     Wt Readings from Last 3 Encounters:  09/08/17 185 lb (83.9 kg)  09/01/17 180 lb 12.8 oz (82 kg)     GEN: Appears well, mildly obese, well nourished, well developed in no acute distress HEENT: Normal NECK: No JVD; No carotid bruits LYMPHATICS: No lymphadenopathy CARDIAC: RRR, no murmurs, rubs, gallops the second heart sound is loud.  Healthy defibrillator site in the left subclavian area. RESPIRATORY:  Clear to auscultation without rales, wheezing or rhonchi  ABDOMEN: Soft, non-tender, non-distended MUSCULOSKELETAL:  No edema; No deformity  SKIN: Warm and dry NEUROLOGIC:  Alert and oriented x 3 PSYCHIATRIC:  Normal affect   ASSESSMENT:    1. Chronic systolic heart failure ([place])   2. PAH (pulmonary artery hypertension) ([place])   3. Cardiac defibrillator in situ   4. PAT (paroxysmal atrial tachycardia) ([place])   5. Essential hypertension   6. CKD (chronic kidney disease) stage 4, GFR 15-29 ml/min (HCC)   7. Type 2 diabetes,  controlled, with peripheral neuropathy ([place])   8. Hypercholesterolemia   9. Atherosclerosis of native coronary artery of native heart without angina pectoris   10. OSA (obstructive sleep apnea)    PLAN:    In order of problems listed above:  1. CHF: By her most recent echo she has relatively mild reduction in left ventricular systolic function.  She appears to be clinically euvolemic by physical exam today.  She is at her estimated dry weight of 180 pounds.  She is very compliant with sodium restriction and daily weight monitoring and self adjust her diuretic dose appropriately. 2. PAH: She has very severe pulmonary artery hypertension.  At the time of a cardiac catheterization, although her pulmonary artery wedge pressure was moderately increased, she had a very high transpulmonary gradient suggesting that the major cause of the pulmonary hypertension is fixed arteriolar disease.  Although her pulmonary function tests appeared to be very abnormal when initially evaluated, follow-up PFTs were only mildly abnormal.  It appears that the dominant reason for her severe pulmonary hypertension might be insufficiently treated obstructive sleep apnea. 3. ICD: Normal defibrillator function. She does not have ventricular pacing.  She requires atrial pacing roughly 50% for sinus bradycardia (may be to some degree age-related, also related to high-dose beta-blocker therapy).  She has never received tachycardia therapies from her defibrillator.  We will transfer her follow-up to our device clinic with remote downloads every 3 months and at least yearly office visits.  She does have a defibrillator lead that is under advisory, but there is no evidence of lead dysfunction and she is not pacemaker dependent.  No plan for lead revision. 4. PAT: her episodes of atrial tachycardia are brief and asymptomatic and do not require any additional therapy, except for continuation of the beta-blocker.  On the other hand, I would  be a little concerned that we might inadvertently trigger defibrillator therapies if she has another spell of atrial tachycardia  with 1:1 AV conduction.  Increase the VF detection to 30/40 beats which would be our default setting for primary prevention anyway. 5. HTN: Fair control, on appropriate treatment with carvedilol, angiotensin receptor blocker, spironolactone for heart failure.  We will dose her systolic blood pressure is a little high, her diastolic blood pressure is quite low and I did not make any changes to her medication.  6. CKD 4: Appears to be stable with a baseline creatinine around 1.9.  I am a little worried that she is taking digoxin but her recent digoxin level was quite low.  On the other hand I do not know that this medicine is really necessary for her heart failure.  Since this was her first encounter with me, I did not make any changes, but have a low threshold for discontinuation of digoxin, since her heart failure is so well controlled. 7. DM: Excellent glycemic control on a very low amount of antidiabetic medications.  It is quite possible she could "cure" her diabetes with weight loss. 8. HLP: She has an excellent recent lipid profile.  I suspect that she is still taking her atorvastatin, even though we are missing it from her medication list.  We will ask her daughter [name] to confirm. 9. CAD: Minor plaque at her last cardiac catheterization and minor carotid plaque by ultrasound.  I think she can reduce her aspirin to 81 mg daily dose, especially with a previous history of erosive gastritis and esophagitis. 10. OSA: More aggressive treatment for her sleep apnea and possibly switching to a BiPAP device may be necessary.  She has severe pulmonary artery hypertension and sleep apnea appears to be the most likely cause.   Medication Adjustments/Labs and Tests Ordered: Current medicines are reviewed at length with the patient today.  Concerns regarding medicines are outlined  above.  Orders Placed This Encounter  Procedures  . EKG 12-Lead   No orders of the defined types were placed in this encounter.   Signed, Sanda Klein, MD  09/09/2017 7:26 PM    [place]'

## 2017-09-08 NOTE — Telephone Encounter (Signed)
LVM for pt to call device clinic back to enroll in [employer] and verify type of home monitor and address.

## 2017-09-08 NOTE — Patient Instructions (Signed)
Dr [name] recommends that you continue on your current medications as directed. Please refer to the Current Medication list given to you today.  Remote monitoring is used to monitor your Pacemaker of ICD from home. This monitoring reduces the number of office visits required to check your device to one time per year. It allows [place] to keep an eye on the functioning of your device to ensure it is working properly. You are scheduled for a device check from home on Wednesday, May 22nd, 2019. You may send your transmission at any time that day. If you have a wireless device, the transmission will be sent automatically. After your physician reviews your transmission, you will receive a postcard with your next transmission date.  Dr [name] recommends that you schedule a follow-up appointment in 6 months with an ICD check. You will receive a reminder letter in the mail two months in advance. If you don't receive a letter, please call our office to schedule the follow-up appointment.  If you need a refill on your cardiac medications before your next appointment, please call your pharmacy.

## 2017-09-08 NOTE — Progress Notes (Unsigned)
Per Loletha Carrow,   She asked that pt go to Pulmonologist due to HTN, Pulmonary, OSA and cpap. And also go to GI due to colonoscopy as she was suppose to follow up back in June 2018 and didn't.   I spoke to patient and pt is willing to go to both. I will put referrals in

## 2017-09-09 DIAGNOSIS — I2721 Secondary pulmonary arterial hypertension: Secondary | ICD-10-CM | POA: Insufficient documentation

## 2017-09-09 DIAGNOSIS — I471 Supraventricular tachycardia: Secondary | ICD-10-CM | POA: Insufficient documentation

## 2017-09-09 DIAGNOSIS — E7800 Pure hypercholesterolemia, unspecified: Secondary | ICD-10-CM | POA: Insufficient documentation

## 2017-09-09 HISTORY — DX: Pure hypercholesterolemia, unspecified: E78.00

## 2017-09-13 ENCOUNTER — Telehealth: Payer: Self-pay | Admitting: Internal Medicine

## 2017-09-13 DIAGNOSIS — E875 Hyperkalemia: Secondary | ICD-10-CM

## 2017-09-13 NOTE — Telephone Encounter (Signed)
Hyperkalemia. BMP

## 2017-09-13 NOTE — Telephone Encounter (Signed)
I have put order in system and released it on our end

## 2017-09-13 NOTE — Telephone Encounter (Signed)
Pt's daughter called in and asked that we put in order for the potassium to get checked at [facility] as pt is going this afternoon to have that done.  I will then call sharon @ [phone]

## 2017-09-14 ENCOUNTER — Other Ambulatory Visit: Payer: Self-pay | Admitting: Family Medicine

## 2017-09-14 DIAGNOSIS — E875 Hyperkalemia: Secondary | ICD-10-CM

## 2017-09-14 LAB — BASIC METABOLIC PANEL
BUN/Creatinine Ratio: 22 (ref 12–28)
BUN: 46 mg/dL — AB (ref 8–27)
CALCIUM: 9.2 mg/dL (ref 8.7–10.3)
CO2: 23 mmol/L (ref 20–29)
Chloride: 104 mmol/L (ref 96–106)
Creatinine, Ser: 2.08 mg/dL — ABNORMAL HIGH (ref 0.57–1.00)
GFR calc Af Amer: 26 mL/min/{1.73_m2} — ABNORMAL LOW (ref >59)
GFR, EST NON AFRICAN AMERICAN: 22 mL/min/{1.73_m2} — AB (ref >59)
Glucose: 108 mg/dL — ABNORMAL HIGH (ref 65–99)
Potassium: 5.7 mmol/L — ABNORMAL HIGH (ref 3.5–5.2)
Sodium: 142 mmol/L (ref 134–144)

## 2017-09-14 MED ORDER — SODIUM POLYSTYRENE SULFONATE PO POWD: Freq: Once | ORAL | 0 refills | Status: AC

## 2017-09-16 ENCOUNTER — Encounter: Payer: Self-pay | Admitting: Family Medicine

## 2017-09-16 ENCOUNTER — Other Ambulatory Visit: Payer: Self-pay | Admitting: Internal Medicine

## 2017-09-16 ENCOUNTER — Other Ambulatory Visit: Payer: Self-pay | Admitting: Family Medicine

## 2017-09-16 DIAGNOSIS — E875 Hyperkalemia: Secondary | ICD-10-CM

## 2017-09-17 ENCOUNTER — Telehealth: Payer: Self-pay

## 2017-09-17 DIAGNOSIS — E875 Hyperkalemia: Secondary | ICD-10-CM

## 2017-09-17 LAB — BASIC METABOLIC PANEL
BUN/Creatinine Ratio: 20 (ref 12–28)
BUN: 41 mg/dL — ABNORMAL HIGH (ref 8–27)
CALCIUM: 9.3 mg/dL (ref 8.7–10.3)
CO2: 23 mmol/L (ref 20–29)
CREATININE: 2.05 mg/dL — AB (ref 0.57–1.00)
Chloride: 106 mmol/L (ref 96–106)
GFR calc Af Amer: 26 mL/min/{1.73_m2} — ABNORMAL LOW (ref >59)
GFR calc non Af Amer: 23 mL/min/{1.73_m2} — ABNORMAL LOW (ref >59)
GLUCOSE: 138 mg/dL — AB (ref 65–99)
Potassium: 5.9 mmol/L — ABNORMAL HIGH (ref 3.5–5.2)
SODIUM: 144 mmol/L (ref 134–144)

## 2017-09-17 MED ORDER — SODIUM POLYSTYRENE SULFONATE 15 GM/60ML PO SUSP: 15.0000 g | Freq: Once | ORAL | 0 refills | Status: AC

## 2017-09-17 NOTE — Telephone Encounter (Signed)
MCr reviewed labs performed yesterday. Recommendations are as follows: - Stay off spironolactone permanently. - Give one more dose of Kayexalate 15g PO and recheck BMET Monday.  [name], patient's daughter advised of recommendations. [name] verbalized understanding and agreed with plan. Requested Rx be sent to [facility]. Rx(s) sent to pharmacy electronically. Paperwork for repeat BMET placed in lab folder.

## 2017-09-21 LAB — BASIC METABOLIC PANEL
BUN / CREAT RATIO: 21 (ref 12–28)
BUN: 37 mg/dL — AB (ref 8–27)
CHLORIDE: 107 mmol/L — AB (ref 96–106)
CO2: 23 mmol/L (ref 20–29)
CREATININE: 1.77 mg/dL — AB (ref 0.57–1.00)
Calcium: 9.3 mg/dL (ref 8.7–10.3)
GFR calc Af Amer: 31 mL/min/{1.73_m2} — ABNORMAL LOW (ref >59)
GFR calc non Af Amer: 27 mL/min/{1.73_m2} — ABNORMAL LOW (ref >59)
GLUCOSE: 85 mg/dL (ref 65–99)
Potassium: 4.7 mmol/L (ref 3.5–5.2)
SODIUM: 145 mmol/L — AB (ref 134–144)

## 2017-09-29 LAB — CUP PACEART INCLINIC DEVICE CHECK
Brady Statistic AP VS Percent: 44.82 %
Brady Statistic AS VP Percent: 0.05 %
Brady Statistic AS VS Percent: 54.83 %
Brady Statistic RA Percent Paced: 44.84 %
HIGH POWER IMPEDANCE MEASURED VALUE: 36 Ohm
HighPow Impedance: 48 Ohm
Lead Channel Impedance Value: 342 Ohm
Lead Channel Impedance Value: 380 Ohm
Lead Channel Impedance Value: 494 Ohm
Lead Channel Pacing Threshold Amplitude: 0.5 V
Lead Channel Pacing Threshold Amplitude: 0.625 V
Lead Channel Pacing Threshold Pulse Width: 0.4 ms
Lead Channel Sensing Intrinsic Amplitude: 0.625 mV
Lead Channel Setting Pacing Amplitude: 2 V
MDC IDC MSMT BATTERY REMAINING LONGEVITY: 67 mo
MDC IDC MSMT BATTERY VOLTAGE: 2.99 V
MDC IDC MSMT LEADCHNL RA PACING THRESHOLD PULSEWIDTH: 0.4 ms
MDC IDC MSMT LEADCHNL RA SENSING INTR AMPL: 0.625 mV
MDC IDC MSMT LEADCHNL RV SENSING INTR AMPL: 3.75 mV
MDC IDC MSMT LEADCHNL RV SENSING INTR AMPL: 3.75 mV
MDC IDC PG IMPLANT DT: 20131120
MDC IDC SESS DTM: 20190220153830
MDC IDC SET LEADCHNL RA PACING AMPLITUDE: 1.5 V
MDC IDC SET LEADCHNL RV PACING PULSEWIDTH: 0.4 ms
MDC IDC SET LEADCHNL RV SENSING SENSITIVITY: 0.3 mV
MDC IDC STAT BRADY AP VP PERCENT: 0.3 %
MDC IDC STAT BRADY RV PERCENT PACED: 0.4 %

## 2017-10-01 ENCOUNTER — Ambulatory Visit: Payer: Medicare PPO | Admitting: Family Medicine

## 2017-10-01 ENCOUNTER — Encounter: Payer: Self-pay | Admitting: Family Medicine

## 2017-10-01 VITALS — BP 128/82 | HR 50 | Temp 98.2°F | Ht 64.0 in | Wt 188.8 lb

## 2017-10-01 DIAGNOSIS — E1142 Type 2 diabetes mellitus with diabetic polyneuropathy: Secondary | ICD-10-CM

## 2017-10-01 DIAGNOSIS — I10 Essential (primary) hypertension: Secondary | ICD-10-CM | POA: Diagnosis not present

## 2017-10-01 DIAGNOSIS — N184 Chronic kidney disease, stage 4 (severe): Secondary | ICD-10-CM

## 2017-10-01 MED ORDER — ONETOUCH VERIO FLEX SYSTEM W/DEVICE KIT: 1.0000 | PACK | Freq: Every day | 0 refills | Status: DC

## 2017-10-01 MED ORDER — GLUCOSE BLOOD VI STRP: ORAL_STRIP | 5 refills | Status: DC

## 2017-10-01 MED ORDER — ONETOUCH ULTRASOFT LANCETS MISC: 5 refills | Status: DC

## 2017-10-01 NOTE — Progress Notes (Signed)
   Subjective:    Patient ID: [name], female    DOB: 14-Feb-1941, 77 y.o.   MRN: [id number]  HPI Chief Complaint  Patient presents with  . Follow-up   She was new to me last month and has a complex health history. She is here to follow up HTN, Diabetes and other chronic health conditions.  She is doing well on current medication regimen. She has seen her cardiologist recently and has upcoming appointments with GI and pulmonology.   She has not concerns or complaints today. States she feels good today. Her daughter who is a cardiology nurse is with her today.  The patient is a retired Marine scientist.   States she needs a new meter and supplies to check her blood sugars.  We cut her glimepride dose in half because her Hgb A1c was 5.7%.   Denies fever, chills, dizziness, chest pain, palpitations, shortness of breath, abdominal pain, N/V/D, urinary symptoms, LE edema.   Reviewed allergies, medications, past medical, surgical, family, and social history.   Review of Systems Pertinent positives and negatives in the history of present illness.     Objective:   Physical Exam BP 128/82 (BP Location: Right Arm, Patient Position: Sitting, Cuff Size: Normal)   Pulse (!) 50   Temp 98.2 F (36.8 C) (Oral)   Ht _0  (1.626 m)   Wt 188 lb 12.8 oz (85.6 kg)   BMI 32.41 kg/m   Alert and oriented an in no acute distress. Normal work of breathing. No LE edema.       Assessment & Plan:  Essential hypertension  CKD (chronic kidney disease) stage 4, GFR 15-29 ml/min (HCC)  Type 2 diabetes, controlled, with peripheral neuropathy ([place])  Discussed that her blood pressure is in goal range today.  Her cardiologist is managing her blood pressure and recently stopped her Spironolactone. We will send in a prescription for her to receive a new meter and supplies so that she can keep an eye on her blood sugar at home.  We reduced glimepiride at her last visit since her A1c was doing so well at 5.7% I  referred her to nephrology due to worsening kidney function and she has not yet heard from them.  We will call and check on this. Plan to bring her back in June for fasting annual Medicare wellness visit and diabetes check

## 2017-10-02 ENCOUNTER — Telehealth: Payer: Self-pay | Admitting: Family Medicine

## 2017-10-02 NOTE — Telephone Encounter (Signed)
P.A. ONE TOUCH VERIO METER, called [facility] and One touch is not covered.  Switched to covered Accu Check Meter and supplies

## 2017-10-20 ENCOUNTER — Encounter: Payer: Self-pay | Admitting: Internal Medicine

## 2017-10-25 ENCOUNTER — Ambulatory Visit: Payer: Medicare PPO | Admitting: Gastroenterology

## 2017-11-22 ENCOUNTER — Telehealth: Payer: Self-pay | Admitting: Internal Medicine

## 2017-11-22 NOTE — Telephone Encounter (Signed)
Ok to give samples if we have them.

## 2017-11-22 NOTE — Telephone Encounter (Signed)
Pt called and states she would like samples of the mybetriq. She can not afford it as a rx as its $100.00

## 2017-11-23 MED ORDER — MIRABEGRON ER 50 MG PO TB24: 50.0000 mg | ORAL_TABLET | Freq: Every day | ORAL | 0 refills | Status: DC

## 2017-11-23 NOTE — Telephone Encounter (Signed)
Gave #28 tablets and pt is aware and will come pick them up

## 2017-11-29 ENCOUNTER — Encounter: Payer: Self-pay | Admitting: Family Medicine

## 2017-11-29 ENCOUNTER — Ambulatory Visit: Payer: Medicare PPO | Admitting: Family Medicine

## 2017-11-29 VITALS — BP 106/62 | HR 50 | Temp 98.1°F | Ht 61.0 in | Wt 185.8 lb

## 2017-11-29 DIAGNOSIS — L309 Dermatitis, unspecified: Secondary | ICD-10-CM | POA: Diagnosis not present

## 2017-11-29 DIAGNOSIS — D631 Anemia in chronic kidney disease: Secondary | ICD-10-CM | POA: Diagnosis not present

## 2017-11-29 DIAGNOSIS — I129 Hypertensive chronic kidney disease with stage 1 through stage 4 chronic kidney disease, or unspecified chronic kidney disease: Secondary | ICD-10-CM | POA: Diagnosis not present

## 2017-11-29 DIAGNOSIS — N184 Chronic kidney disease, stage 4 (severe): Secondary | ICD-10-CM | POA: Diagnosis not present

## 2017-11-29 MED ORDER — PREDNISONE 10 MG (21) PO TBPK: ORAL_TABLET | ORAL | 0 refills | Status: DC

## 2017-11-29 NOTE — Progress Notes (Signed)
   Subjective:    Patient ID: [name], female    DOB: 1940/11/30, 77 y.o.   MRN: [id number]  HPI She woke up yesterday morning and is noted bullous lesions on both feet down to the ankle area.  Also proximal to that she had multiple erythematous macular lesions on her legs and also on her arms.  Her medications are unchanged.  She does not remember getting exposed to anything in particular.   Review of Systems     Objective:   Physical Exam Alert and in no distress.  Exam of both lower extremities does show bullous lesions filled with fluid.  Proximal to that multiple erythematous macular type lesions of varying sizes were noted.  They were not warm or tender with no underlying palpable lesions.  Several were also noted on her arms but nothing on her torso or back.       Assessment & Plan:  Dermatitis - Plan: predniSONE (STERAPRED UNI-PAK 21 TAB) 10 MG (21) TBPK tablet  Lesions were also evaluated by Dr. Tomi Bamberger.  We have decided to treat this with prednisone. CBC/treatment was  to be ordered but she left before we could get it done.

## 2017-11-30 ENCOUNTER — Telehealth: Payer: Self-pay

## 2017-11-30 NOTE — Telephone Encounter (Signed)
Received fax change request for Accu-chek Aviva plus test strips to accu-chek guide.  I told them that was fine to change.

## 2017-12-01 MED ORDER — BLOOD GLUCOSE TEST VI STRP: ORAL_STRIP | 1 refills | Status: DC

## 2017-12-01 NOTE — Telephone Encounter (Signed)
Sent in test strips to pharmacy

## 2017-12-01 NOTE — Addendum Note (Signed)
Addended by: Minette Headland A on: 12/01/2017 09:50 AM   Modules accepted: Orders

## 2017-12-02 ENCOUNTER — Telehealth: Payer: Self-pay | Admitting: Family Medicine

## 2017-12-02 ENCOUNTER — Ambulatory Visit: Payer: Medicare PPO | Admitting: Family Medicine

## 2017-12-02 ENCOUNTER — Other Ambulatory Visit: Payer: Self-pay | Admitting: Nephrology

## 2017-12-02 ENCOUNTER — Encounter: Payer: Self-pay | Admitting: Family Medicine

## 2017-12-02 VITALS — BP 146/82 | HR 50 | Temp 98.0°F | Wt 186.4 lb

## 2017-12-02 DIAGNOSIS — N184 Chronic kidney disease, stage 4 (severe): Secondary | ICD-10-CM

## 2017-12-02 DIAGNOSIS — I129 Hypertensive chronic kidney disease with stage 1 through stage 4 chronic kidney disease, or unspecified chronic kidney disease: Secondary | ICD-10-CM

## 2017-12-02 DIAGNOSIS — L309 Dermatitis, unspecified: Secondary | ICD-10-CM | POA: Diagnosis not present

## 2017-12-02 DIAGNOSIS — D631 Anemia in chronic kidney disease: Secondary | ICD-10-CM

## 2017-12-02 DIAGNOSIS — N189 Chronic kidney disease, unspecified: Secondary | ICD-10-CM

## 2017-12-02 NOTE — Telephone Encounter (Signed)
MYRBETRIQ per Loletha Carrow is too expensive for pt.  I called [employer] t# [phone] for options for pt assistance for Medicare Part D.   If medication is covered options are Tiering exception thru insurance company or [place] t# [phone]. If medication is not covered then must do Prior Authorization and if still not covered then can do Pt Assistance Program thru [employer].   I called pharmacy and went thru insurance for $47.00.   Called pt and gave info for Low income Subsidy and she will check into this.

## 2017-12-02 NOTE — Progress Notes (Signed)
   Subjective:    Patient ID: [name], female    DOB: 1941-03-26, 76 y.o.   MRN: [id number]  HPI She is here for recheck visit.  Since last being seen the bullous lesions have almost completely dried up as well as the erythematous rash is also diminished.  She was placed on a short-term steroid Dosepak.   Review of Systems     Objective:   Physical Exam Alert and in no distress.  Exam of both lower extremities does show almost complete resolution of the bullous lesions in the area is dry with less swelling.  Erythema is much diminished.       Assessment & Plan:  Dermatitis  She will finish out the steroid preparation and did encourage her to keep the area covered and allow it to heal without getting any further irritation from her clothing or shoes.

## 2017-12-08 ENCOUNTER — Ambulatory Visit (INDEPENDENT_AMBULATORY_CARE_PROVIDER_SITE_OTHER): Payer: Medicare PPO | Admitting: *Deleted

## 2017-12-08 DIAGNOSIS — I5022 Chronic systolic (congestive) heart failure: Secondary | ICD-10-CM

## 2017-12-08 NOTE — Progress Notes (Signed)
Remote ICD transmission.   

## 2017-12-10 ENCOUNTER — Ambulatory Visit: Payer: Medicare PPO | Admitting: Pulmonary Disease

## 2017-12-10 ENCOUNTER — Encounter: Payer: Self-pay | Admitting: Pulmonary Disease

## 2017-12-10 ENCOUNTER — Ambulatory Visit (INDEPENDENT_AMBULATORY_CARE_PROVIDER_SITE_OTHER)
Admission: RE | Admit: 2017-12-10 | Discharge: 2017-12-10 | Disposition: A | Discharge status: Home / Self Care | Payer: Medicare PPO | Source: Ambulatory Visit | Attending: Pulmonary Disease | Admitting: Pulmonary Disease

## 2017-12-10 VITALS — BP 118/70 | HR 50 | Ht 64.0 in | Wt 182.8 lb

## 2017-12-10 DIAGNOSIS — J9809 Other diseases of bronchus, not elsewhere classified: Secondary | ICD-10-CM | POA: Diagnosis not present

## 2017-12-10 DIAGNOSIS — R942 Abnormal results of pulmonary function studies: Secondary | ICD-10-CM | POA: Diagnosis not present

## 2017-12-10 DIAGNOSIS — G4733 Obstructive sleep apnea (adult) (pediatric): Secondary | ICD-10-CM | POA: Diagnosis not present

## 2017-12-10 NOTE — Patient Instructions (Signed)
Chest xray today Will arrange for new CPAP supplies with [facility] Will get copy of your sleep study from [facility] Please forward copies of your recent lab tests from primary care and nephrology offices  Follow up in 4 months

## 2017-12-10 NOTE — Progress Notes (Signed)
[name], Critical Care, and Sleep Medicine  Chief Complaint  Patient presents with  . Consult    COPD since 2008, just needs to establish pulomnologist in this area. Denies any concerns.      Vital signs: BP 118/70   Pulse (!) 50   Ht _0  (1.626 m)   Wt 182 lb 12.8 oz (82.9 kg)   SpO2 98%   BMI 31.38 kg/m   History of Present Illness: [name] is a 77 y.o. female former smoker with OSA and emphysema.  She was living in [place] and followed by [name] there.  She recently moved to [place].  She has history of sleep apnea.  Sleep study showed severe sleep apnea.  She sleeps for 7 to 10 hours.  No issue with mask fit.  She uses 2 liters oxygen at night with CPAP.  Not having cough, wheeze, sputum, chest pain, swelling, fever, hemoptysis.   Physical Exam:  General - pleasant Eyes - pupils reactive ENT - no sinus tenderness, no oral exudate, no LAN Cardiac - regular, no murmur Chest - no wheeze, rales Abd - soft, non tender Ext - no edema Skin - no rashes Neuro - normal strength Psych - normal mood   Assessment/Plan:  COPD with emphysema. - will get records from [place] - will get copy of recent lab work - will then determine if she needs maintenance inhaler therapy - chest xray today  Obstructive sleep apnea. - will arrange for [facility] to provide CPAP supplies  Patient Instructions  Chest xray today Will arrange for new CPAP supplies with [facility] Will get copy of your sleep study from [facility] Please forward copies of your recent lab tests from primary care and nephrology offices  Follow up in 4 months    [name], MD [place] 12/10/2017, 2:48 PM  Flow Sheet  [name] tests:  Sleep tests:  Cardiac tests:  Review of Systems: Constitutional: Negative for fever and unexpected weight change.  HENT: Negative for congestion, dental problem, ear pain, nosebleeds, postnasal drip, rhinorrhea,  sinus pressure, sneezing, sore throat and trouble swallowing.   Eyes: Negative for redness and itching.  Respiratory: Negative for cough, chest tightness, shortness of breath and wheezing.   Cardiovascular: Negative for palpitations and leg swelling.  Gastrointestinal: Negative for nausea and vomiting.  Genitourinary: Negative for dysuria.  Musculoskeletal: Negative for joint swelling.  Skin: Negative for rash.  Allergic/Immunologic: Negative.  Negative for environmental allergies, food allergies and immunocompromised state.  Neurological: Negative for headaches.  Hematological: Does not bruise/bleed easily.  Psychiatric/Behavioral: Negative for dysphoric mood. The patient is not nervous/anxious.    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  Past Surgical History: She  has a past surgical history that includes Upper gi endoscopy (11/28/2009).  Family History: She has family history of heart disease.   Social History: She  reports that she has quit smoking. She has never used smokeless tobacco. She reports that she does not drink alcohol or use drugs.  Medications: Allergies as of 12/10/2017      Reactions   Amlodipine Besy-benazepril Hcl    Lotrel   Talwin [pentazocine]  Hallulate      Medication List        Accurate as of 12/10/17  2:48 PM. Always use your most recent med list.          amLODipine-valsartan 10-160 MG tablet Commonly known as:  EXFORGE Take 1 tablet by mouth daily.   aspirin 81 MG tablet Take 81 mg by mouth daily.   BLOOD GLUCOSE TEST STRIPS  Strp Test once a day. Pt uses accu-chek guide dx.E11.9   carvedilol 25 MG tablet Commonly known as:  COREG Take 25 mg by mouth 2 (two) times daily with a meal.   DIGOX 0.125 MG tablet Generic drug:  digoxin Take 1 tablet by mouth daily.   furosemide 40 MG tablet Commonly known as:  LASIX Take 80 mg by mouth daily.   glimepiride 2 MG tablet Commonly known as:  AMARYL Take 4 mg by mouth daily.   mirabegron ER 50 MG Tb24 tablet Commonly known as:  MYRBETRIQ Take 1 tablet (50 mg total) by mouth daily.   OXYGEN Inhale into the lungs.   terazosin 2 MG capsule Commonly known as:  HYTRIN Take 2 mg by mouth at bedtime.

## 2017-12-10 NOTE — Progress Notes (Signed)
   Subjective:    Patient ID: WINTA BARCELO, female    DOB: Jul 16, 1941, 77 y.o.   MRN: [id number]  HPI    Review of Systems  Constitutional: Negative for fever and unexpected weight change.  HENT: Negative for congestion, dental problem, ear pain, nosebleeds, postnasal drip, rhinorrhea, sinus pressure, sneezing, sore throat and trouble swallowing.   Eyes: Negative for redness and itching.  Respiratory: Negative for cough, chest tightness, shortness of breath and wheezing.   Cardiovascular: Negative for palpitations and leg swelling.  Gastrointestinal: Negative for nausea and vomiting.  Genitourinary: Negative for dysuria.  Musculoskeletal: Negative for joint swelling.  Skin: Negative for rash.  Allergic/Immunologic: Negative.  Negative for environmental allergies, food allergies and immunocompromised state.  Neurological: Negative for headaches.  Hematological: Does not bruise/bleed easily.  Psychiatric/Behavioral: Negative for dysphoric mood. The patient is not nervous/anxious.        Objective:   Physical Exam        Assessment & Plan:

## 2017-12-18 ENCOUNTER — Other Ambulatory Visit: Payer: Self-pay | Admitting: Family Medicine

## 2017-12-22 ENCOUNTER — Ambulatory Visit
Admission: RE | Admit: 2017-12-22 | Discharge: 2017-12-22 | Disposition: A | Discharge status: Home / Self Care | Payer: Medicare PPO | Source: Ambulatory Visit | Attending: Nephrology | Admitting: Nephrology

## 2017-12-22 DIAGNOSIS — N184 Chronic kidney disease, stage 4 (severe): Secondary | ICD-10-CM | POA: Diagnosis not present

## 2017-12-22 DIAGNOSIS — D631 Anemia in chronic kidney disease: Secondary | ICD-10-CM

## 2017-12-22 DIAGNOSIS — N189 Chronic kidney disease, unspecified: Secondary | ICD-10-CM

## 2017-12-22 DIAGNOSIS — I129 Hypertensive chronic kidney disease with stage 1 through stage 4 chronic kidney disease, or unspecified chronic kidney disease: Secondary | ICD-10-CM

## 2017-12-23 ENCOUNTER — Ambulatory Visit: Payer: Medicare PPO | Admitting: Family Medicine

## 2017-12-24 ENCOUNTER — Telehealth: Payer: Self-pay | Admitting: Pulmonary Disease

## 2017-12-24 NOTE — Telephone Encounter (Signed)
  CLINICAL DATA:  Abnormal pulmonary function tests. No current complaints. Ex-smoker.  EXAM: CHEST - 2 VIEW  COMPARISON:  06/29/2007.  FINDINGS: Stable enlarged cardiac silhouette and tortuous aorta. Stable left subclavian AICD leads. Clear lungs with normal vascularity. Diffuse peribronchial thickening. Thoracic and upper lumbar spine degenerative changes.  IMPRESSION: 1. Moderate chronic bronchitic changes. 2. Cardiomegaly.   Electronically Signed   By: Claudie Revering M.D.   On: 12/11/2017 11:50    Please let her know that CXR shows expected changes with hx of COPD.  No other worrisome findings.  Will review in more detail at f/u visit.

## 2017-12-24 NOTE — Telephone Encounter (Signed)
Left voice mail on machine for patient to return phone call back regarding results. X1

## 2017-12-27 ENCOUNTER — Ambulatory Visit: Payer: Medicare PPO | Admitting: Family Medicine

## 2017-12-27 LAB — CUP PACEART REMOTE DEVICE CHECK
Battery Remaining Longevity: 62 mo
Brady Statistic AP VS Percent: 88.59 %
Brady Statistic AS VP Percent: 0.05 %
Brady Statistic AS VS Percent: 9.84 %
Date Time Interrogation Session: 20190522142747
HIGH POWER IMPEDANCE MEASURED VALUE: 35 Ohm
HighPow Impedance: 46 Ohm
Lead Channel Impedance Value: 342 Ohm
Lead Channel Impedance Value: 342 Ohm
Lead Channel Pacing Threshold Amplitude: 0.625 V
Lead Channel Pacing Threshold Amplitude: 0.625 V
Lead Channel Pacing Threshold Pulse Width: 0.4 ms
Lead Channel Pacing Threshold Pulse Width: 0.4 ms
Lead Channel Sensing Intrinsic Amplitude: 1.125 mV
Lead Channel Sensing Intrinsic Amplitude: 4.375 mV
Lead Channel Setting Pacing Amplitude: 2 V
Lead Channel Setting Pacing Pulse Width: 0.4 ms
MDC IDC MSMT BATTERY VOLTAGE: 2.98 V
MDC IDC MSMT LEADCHNL RA SENSING INTR AMPL: 1.125 mV
MDC IDC MSMT LEADCHNL RV IMPEDANCE VALUE: 437 Ohm
MDC IDC MSMT LEADCHNL RV SENSING INTR AMPL: 4.375 mV
MDC IDC PG IMPLANT DT: 20131120
MDC IDC SET LEADCHNL RA PACING AMPLITUDE: 1.5 V
MDC IDC SET LEADCHNL RV SENSING SENSITIVITY: 0.3 mV
MDC IDC STAT BRADY AP VP PERCENT: 1.52 %
MDC IDC STAT BRADY RA PERCENT PACED: 88.95 %
MDC IDC STAT BRADY RV PERCENT PACED: 1.84 %

## 2017-12-28 NOTE — Telephone Encounter (Signed)
Called and spoke with patient regarding results.  Informed the patient of results and recommendations today. Pt verbalized understanding and denied any questions or concerns at this time.  Nothing further needed.

## 2018-01-04 ENCOUNTER — Ambulatory Visit: Payer: Medicare PPO | Admitting: Gastroenterology

## 2018-01-04 ENCOUNTER — Institutional Professional Consult (permissible substitution): Payer: Medicare PPO | Admitting: Pulmonary Disease

## 2018-01-04 ENCOUNTER — Encounter (INDEPENDENT_AMBULATORY_CARE_PROVIDER_SITE_OTHER): Payer: Self-pay

## 2018-01-04 VITALS — BP 138/70 | HR 74 | Ht 64.0 in | Wt 190.4 lb

## 2018-01-04 DIAGNOSIS — Z9981 Dependence on supplemental oxygen: Secondary | ICD-10-CM

## 2018-01-04 DIAGNOSIS — D509 Iron deficiency anemia, unspecified: Secondary | ICD-10-CM

## 2018-01-04 DIAGNOSIS — Z1211 Encounter for screening for malignant neoplasm of colon: Secondary | ICD-10-CM

## 2018-01-04 NOTE — Progress Notes (Signed)
HPI :  77 year old female with history of coronary artery disease, CHF (ejection fraction 45%), ICD in place, diabetes, stage IV CKD referred here by Harland Dingwall to discuss having a colonoscopy in light of history of iron deficiency anemia.  Daughter is present with the patient today for this encounter.  She reportedly has a chronic anemia.  She brings labs as obtained by her nephrologist showing a hemoglobin of 10.0 with a low iron saturation, she was told she was iron deficient.  She states her last colonoscopy was more than 10 years ago, she thinks it was normal.  She denies any family history of colon cancer.  She has chronic constipation, has a bowel movement roughly once per week, she has some straining associated with this.  She denies any overt blood in her stools.  She denies any abdominal pains.  She normally does not take anything for her bowels, perhaps an occasional Dulcolax.  She is never tried MiraLAX or other over-the-counter laxatives.  She does wear oxygen at night with sleep and has a history of obstructive sleep apnea.  She does have a history of an upper endoscopy in 2011 which showed mild esophagitis and mild gastritis.  She denies any heartburn, no dysphagia, no upper abdominal pains.  Report of colonoscopy not available today.   EGD 11/2009 - mild esophagitis, mild gastriits,    Past Medical History:  Diagnosis Date  . CAD (coronary artery disease)   . Cardiac defibrillator in situ   . Chronic systolic heart failure ([place])   . CKD (chronic kidney disease) stage 4, GFR 15-29 ml/min ([facility])   . HTN (hypertension)   . Malignant neoplasm of rectum ([place]) 09/08/2017   Dr. Rollene Rotunda in [place]. Per notes she was due for colonoscopy in 12/2016  . OSA on CPAP 09/08/2017  . Other cardiomyopathies ([place])   . Pulmonary HTN ([place])   . Type 2 diabetes, controlled, with peripheral neuropathy ([place])   . Urge incontinence of urine      Past Surgical History:  Procedure Laterality Date    . UPPER GI ENDOSCOPY  11/28/2009   mild erosive esophagitis, mild erosive changes in stomach, chronic constipation   No family history on file. Social History   Tobacco Use  . Smoking status: Former Research scientist (life sciences)  . Smokeless tobacco: Never Used  . Tobacco comment: quit smoking 2010  Substance Use Topics  . Alcohol use: No    Frequency: Never  . Drug use: No   Current Outpatient Medications  Medication Sig Dispense Refill  . amLODipine (NORVASC) 10 MG tablet Take 10 mg by mouth daily.    [name] aspirin 81 MG tablet Take 81 mg by mouth daily.     . Blood Glucose Monitoring Suppl (ACCU-CHEK GUIDE) w/Device KIT USE AS DIRECTED 1 kit 0  . carvedilol (COREG) 25 MG tablet Take 25 mg by mouth 2 (two) times daily with a meal.  0  . DIGOX 125 MCG tablet Take 1 tablet by mouth daily.    . ferrous sulfate 325 (65 FE) MG tablet Take 325 mg by mouth 2 (two) times daily with a meal.    . furosemide (LASIX) 40 MG tablet Take 80 mg by mouth daily.   1  . glimepiride (AMARYL) 2 MG tablet Take 2 mg by mouth daily.   0  . Glucose Blood (BLOOD GLUCOSE TEST STRIPS) STRP Test once a day. Pt uses accu-chek guide dx.E11.9 100 each 1  . mirabegron ER (MYRBETRIQ) 50 MG TB24 tablet  Take 1 tablet (50 mg total) by mouth daily. 28 tablet 0  . OXYGEN Inhale into the lungs.    . terazosin (HYTRIN) 2 MG capsule Take 2 mg by mouth at bedtime.  0  . Vitamin D, Ergocalciferol, (DRISDOL) 50000 units CAPS capsule Take 50,000 Units by mouth every 7 (seven) days. Weekly for 12 weeks then monthly     No current facility-administered medications for this visit.    Allergies  Allergen Reactions  . Amlodipine Besy-Benazepril Hcl     Lotrel  . Talwin [Pentazocine]     Hallulate     Review of Systems: All systems reviewed and negative except where noted in HPI.    Dg Chest 2 View  Result Date: 12/11/2017 CLINICAL DATA:  Abnormal pulmonary function tests. No current complaints. Ex-smoker. EXAM: CHEST - 2 VIEW COMPARISON:   06/29/2007. FINDINGS: Stable enlarged cardiac silhouette and tortuous aorta. Stable left subclavian AICD leads. Clear lungs with normal vascularity. Diffuse peribronchial thickening. Thoracic and upper lumbar spine degenerative changes. IMPRESSION: 1. Moderate chronic bronchitic changes. 2. Cardiomegaly. Electronically Signed   By: Claudie Revering M.D.   On: 12/11/2017 11:50   US Renal  Result Date: 12/23/2017 CLINICAL DATA:  Chronic kidney disease, stage IV. EXAM: RENAL / URINARY TRACT ULTRASOUND COMPLETE COMPARISON:  None. FINDINGS: Right Kidney: Length: 10 cm. Echogenicity within normal limits. No mass or hydronephrosis visualized. Left Kidney: Length: 9.6 cm. Echogenicity within normal limits. No mass or hydronephrosis visualized. Bladder: Appears normal for degree of bladder distention. IMPRESSION: Symmetric normal renal length.  No hydronephrosis or scarring. Electronically Signed   By: Monte Fantasia M.D.   On: 12/23/2017 08:37    Physical Exam: BP 138/70   Pulse 74   Ht 5' 4" (1.626 m)   Wt 190 lb 6 oz (86.4 kg)   BMI 32.68 kg/m  Constitutional: Pleasant,well-developed,female in no acute distress. HEENT: Normocephalic and atraumatic. Conjunctivae are normal. No scleral icterus. Neck supple.  Cardiovascular: Normal rate, regular rhythm.  Pulmonary/chest: Effort normal and breath sounds normal. No wheezing, rales or rhonchi. Abdominal: Soft, nondistended, nontender.  There are no masses palpable. No hepatomegaly. Extremities: no edema Lymphadenopathy: No cervical adenopathy noted. Neurological: Alert and oriented to person place and time. Skin: Skin is warm and dry. No rashes noted. Psychiatric: Normal mood and affect. Behavior is normal.   Labs per HPI as above  Lab Results  Component Value Date   WBC 4.5 09/01/2017   HGB 11.1 09/01/2017   HCT 35.0 09/01/2017   MCV 87 09/01/2017   PLT 234 09/01/2017   Lab Results  Component Value Date   CREATININE 1.77 (H) 09/20/2017   BUN  37 (H) 09/20/2017   NA 145 (H) 09/20/2017   K 4.7 09/20/2017   CL 107 (H) 09/20/2017   CO2 23 09/20/2017   Lab Results  Component Value Date   ALT 10 09/01/2017   AST 20 09/01/2017   ALKPHOS 117 09/01/2017   BILITOT 0.6 09/01/2017     ASSESSMENT AND PLAN: 77 year old female with history of chronic kidney disease, CHF, with ongoing iron deficiency, referred to discuss endoscopic evaluation.  Her last colonoscopy was a very long time ago and we do not have the records of that.  She otherwise has chronic constipation which appears at baseline and no overt blood in her stools.  We discussed whether or not she wanted further evaluation of this iron deficiency in light of her age and comorbidities.  I discussed endoscopic evaluation for iron deficiency  to include upper endoscopy and colonoscopy.  I discussed the risks and benefits of endoscopic and anesthesia with her.  If she wanted to have this done it would need to be done in the hospital given her oxygen requirement.  Following a discussion of these issues the patient and her daughter did want to proceed with endoscopic evaluation.  This will be coordinated to be done at the hospital.  She seems stable from a cardiopulmonary perspective at this time.  In the interim recommend she take MiraLAX twice daily and titrate as needed to goal bowel movement once every other day.  If MiraLAX does not help her symptoms she can contact me for reassessment.  [place] Cellar, MD [facility]  CC: Girtha Rm, NP-C

## 2018-01-04 NOTE — Patient Instructions (Signed)
If you are age 79 or older, your body mass index should be between 23-30. Your Body mass index is 32.68 kg/m. If this is out of the aforementioned range listed, please consider follow up with your Primary Care Provider.  If you are age 22 or younger, your body mass index should be between 19-25. Your Body mass index is 32.68 kg/m. If this is out of the aformentioned range listed, please consider follow up with your Primary Care Provider.   It has been recommended that you have a endoscopy/colonoscopy completed at the hospital. Per your request we will notify you once Dr. Doyne Keel September schedule becomes available so we can get you scheduled.    Thank you for entrusting me with your care and for choosing [facility], Dr. [place] Cellar

## 2018-01-25 ENCOUNTER — Telehealth: Payer: Self-pay

## 2018-01-25 ENCOUNTER — Other Ambulatory Visit: Payer: Self-pay

## 2018-01-25 DIAGNOSIS — Z9981 Dependence on supplemental oxygen: Secondary | ICD-10-CM

## 2018-01-25 DIAGNOSIS — Z1211 Encounter for screening for malignant neoplasm of colon: Secondary | ICD-10-CM

## 2018-01-25 DIAGNOSIS — D509 Iron deficiency anemia, unspecified: Secondary | ICD-10-CM

## 2018-01-25 NOTE — Telephone Encounter (Signed)
-----  Message from Roetta Sessions, [place] sent at 01/04/2018  3:43 PM EDT ----- Regarding: schedule endo/colon at hospital Pt wants ECL in September at [facility].    Dx: IDA, Colon Cancer Screening, Home O2 use.  Call and schedule as soon as schedule is out.  Pt on oxygen. Not too early of an appt.  Call daughter, [name] [phone]

## 2018-01-25 NOTE — Progress Notes (Signed)
ECL at [facility] on 03-29-18 at 10:15am. Nurse visit on 03-14-18 at 4:30pm. [name], pt's daughter, contact.

## 2018-01-25 NOTE — Telephone Encounter (Signed)
Called pt's daughter, [name].  03-29-18 will work for them.  Scheduled ECL at [facility] with Dr. Loni Muse at 10:15am. On 03-29-18. Scheduled her for a Nurse visit on 03-14-18 at 4:30pm. Called and informed [name], pt's daughter. She expressed understanding

## 2018-01-26 NOTE — Progress Notes (Signed)
[name] is a 77 y.o. female who presents for annual wellness visit and follow-up on chronic medical conditions.  She has the following concerns:  Recent blood sugars at home have been in normal range and she has had some low BS when she checks them in the very early mornings. Currently on glimepride only.   OSA on CPAP and oxygen at night. Doing well. Good compliance. Seeing her pulmonologist for COPD and PAH.   Decreased hearing on right.   Urinary symptoms well controlled on Myrbetriq.   She would like to be a DNR and we will fill out these forms. Her daughter is with her.   She saw her nephrologist earlier today, Dr. [name]. she reports having a renal panel checked and told to follow up in 4 months.  Taking vitamin D and iron per nephrologist.  switched her to plain amlodipine.  Renal US in May normal per patient.    She will see her cardiologist in September.    Immunization History  Administered Date(s) Administered  . Influenza, High Dose Seasonal PF 08/04/2016, 05/02/2017  . Influenza-Unspecified 05/23/2013, 08/25/2013, 06/22/2014, 06/22/2014, 05/22/2015  . Pneumococcal Conjugate-13 06/22/2014  . Pneumococcal-Unspecified 01/12/2008  . Tdap 01/12/2008  . Zoster 07/21/2011, 12/08/2011   Last Pap smear: years ago. She requests a pap smear today. Unknown history of pap smears, nothing on file.  Last mammogram: august 2018 Last colonoscopy: March 29, 2018 Last DEXA: 2011 Dentist: scheduling with someone soon Ophtho: will get one soon Exercise: once a week  Other doctors caring for patient include: Dr. Raynelle Highland Dr. Halford Chessman- Pulmonary Dr. [name]- Cardiology Dr. [name]- [facility]    Depression screen:  See questionnaire below.  Depression screen [facility] 2/9 01/27/2018 09/01/2017  Decreased Interest 0 0  Down, Depressed, Hopeless 0 0  PHQ - 2 Score 0 0    Fall Risk Screen: see questionnaire below. Fall Risk  01/27/2018 09/01/2017  Falls in the past  year? No No    ADL screen:  See questionnaire below Functional Status Survey: Is the patient deaf or have difficulty hearing?: No Does the patient have difficulty seeing, even when wearing glasses/contacts?: No Does the patient have difficulty concentrating, remembering, or making decisions?: No Does the patient have difficulty walking or climbing stairs?: Yes(gets sOB) Does the patient have difficulty dressing or bathing?: No Does the patient have difficulty doing errands alone such as visiting a doctor's office or shopping?: Yes(doesn't drive so has to have someone with her)   End of Life Discussion:  Patient does not have a living will and medical power of attorney. MOST form filled out. She also wanted to be a DNR. We filled out this form as well and she was given the original.   Review of Systems Constitutional: -fever, -chills, -sweats, -unexpected weight change, -anorexia, -fatigue Allergy: -sneezing, -itching, -congestion Dermatology: denies changing moles, rash, lumps, new worrisome lesions ENT: -runny nose, -ear pain, -sore throat, -hoarseness, -sinus pain, -teeth pain, -tinnitus, + right hearing loss, -epistaxis Cardiology:  -chest pain, -palpitations, -edema, -orthopnea, -paroxysmal nocturnal dyspnea Respiratory: -cough, -shortness of breath, -dyspnea on exertion, -wheezing, -hemoptysis Gastroenterology: -abdominal pain, -nausea, -vomiting, -diarrhea, -constipation, -blood in stool, -changes in bowel movement, -dysphagia Hematology: -bleeding or bruising problems Musculoskeletal: -arthralgias, -myalgias, -joint swelling, -back pain, -neck pain, -cramping, -gait changes Ophthalmology: -vision changes, -eye redness, -itching, -discharge Urology: -dysuria, -difficulty urinating, -hematuria, -urinary frequency, -urgency, incontinence Neurology: -headache, -weakness, -tingling, -numbness, -speech abnormality, -memory loss, -falls, -dizziness Psychology:  -depressed mood,  -agitation, -sleep problems  PHYSICAL EXAM:  BP 122/76   Pulse (!) 48 Comment: always low pulse  Ht 5' 0.75" (1.543 m)   Wt 187 lb 3.2 oz (84.9 kg)   SpO2 98%   BMI 35.66 kg/m   General Appearance: Alert, cooperative, no distress, appears stated age Head: Normocephalic, without obvious abnormality, atraumatic Eyes: PERRL, conjunctiva/corneas clear, EOM's intact, fundi benign Ears: Normal TM's and external ear canals Nose: Nares normal, mucosa normal, no drainage or sinus   tenderness Throat: Lips, mucosa, and tongue normal; teeth and gums normal Neck: Supple, no lymphadenopathy; thyroid: no enlargement/tenderness/nodules; no carotid bruit or JVD Back: Spine nontender, no curvature, ROM normal, no CVA tenderness Lungs: Clear to auscultation bilaterally without wheezes, rales or ronchi; respirations unlabored Chest Wall: No tenderness or deformity Heart: Regular rate and rhythm, S1 and S2 normal, no murmur, rub or gallop Breast Exam: not done. Mammogram up to date Abdomen: Soft, non-tender, nondistended, normoactive bowel sounds, no masses, no hepatosplenomegaly Genitalia: Normal external genitalia without lesions.  BUS and vagina normal; difficulty to visualize cervix. no cervical motion tenderness. No abnormal vaginal discharge.  Pap performed patient request. Chaperone present.  Extremities: No clubbing, cyanosis or edema Pulses: 2+ and symmetric all extremities Skin: Skin color, texture, turgor normal, no rashes or lesions Lymph nodes: Cervical, supraclavicular, and axillary nodes normal Neurologic: CNII-XII intact, normal strength, sensation. Gait - uses a cane; reflexes 2+ and symmetric throughout Psych: Normal mood, affect, hygiene and grooming.  ASSESSMENT/PLAN: Medicare annual wellness visit, subsequent  Essential hypertension  PAH (pulmonary artery hypertension) (HCC)  OSA on CPAP  Type 2 diabetes, controlled, with peripheral neuropathy ([place]) - Plan: CANCELED:  Microalbumin / creatinine urine ratio  CKD (chronic kidney disease) stage 4, GFR 15-29 ml/min (HCC)  Hypercholesterolemia  Screening for cervical cancer - Plan: Cytology - PAP  Vaccine counseling  Advance directive discussed with patient  Decreased hearing of right ear  Hypoglycemia associated with type 2 diabetes mellitus ([place])  She is being followed by multiple specialists for chronic health conditions including her cardiologist, pulmonologist, GI and nephrologist.  Hgb A1c 6.2% today and she recently had some low blood sugars. Plan to stop the glimepride and have her continue checking her BS at home.  microalbumin ordered today. She was unable to leave a urine specimen today.  She plans to establish with ophthalmology for a diabetic eye exam.  Hearing test done. 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 She plans to get an closed caption adapter for her phone. Declines referral to audiologist at this time. Does not want hearing aids.  BP is in goal range. Continue on current medication.  Bradycardia and asymptomatic. Reports this is baseline.  Immunizations- prescription given for Tdap and Shingrix. She will contact her insurance company for cost.  Pap smear done per patient request.  Scheduled for a colonoscopy in September with Dr. Havery Moros.  Mammogram UTD DNR filled out and original given to patient.    Discussed monthly self breast exams and yearly mammograms; at least 30 minutes of aerobic activity at least 5 days/week and weight-bearing exercise 2x/week; proper sunscreen use reviewed; healthy diet, including goals of calcium and vitamin D intake and alcohol recommendations (less than or equal to 1 drink/day) reviewed; regular seatbelt use; changing batteries in smoke detectors.  Immunization recommendations discussed.  Colonoscopy recommendations reviewed   Medicare Attestation I have personally reviewed: The patient's medical and social history Their use of alcohol,  tobacco or illicit drugs Their current medications and supplements The patient's functional ability  including ADLs,fall risks, home safety risks, cognitive, and hearing and visual impairment Diet and physical activities Evidence for depression or mood disorders  The patient's weight, height, and BMI have been recorded in the chart.  I have made referrals, counseling, and provided education to the patient based on review of the above and I have provided the patient with a written personalized care plan for preventive services.     Harland Dingwall, NP-C   01/27/2018

## 2018-01-27 ENCOUNTER — Other Ambulatory Visit (HOSPITAL_COMMUNITY)
Admission: RE | Admit: 2018-01-27 | Discharge: 2018-01-27 | Disposition: A | Discharge status: Home / Self Care | Payer: Medicare PPO | Source: Ambulatory Visit | Attending: Family Medicine | Admitting: Family Medicine

## 2018-01-27 ENCOUNTER — Ambulatory Visit (INDEPENDENT_AMBULATORY_CARE_PROVIDER_SITE_OTHER): Payer: Medicare PPO | Admitting: Family Medicine

## 2018-01-27 ENCOUNTER — Encounter: Payer: Self-pay | Admitting: Family Medicine

## 2018-01-27 VITALS — BP 122/76 | HR 48 | Ht 60.75 in | Wt 187.2 lb

## 2018-01-27 DIAGNOSIS — Z7189 Other specified counseling: Secondary | ICD-10-CM

## 2018-01-27 DIAGNOSIS — Z124 Encounter for screening for malignant neoplasm of cervix: Secondary | ICD-10-CM | POA: Insufficient documentation

## 2018-01-27 DIAGNOSIS — Z9989 Dependence on other enabling machines and devices: Secondary | ICD-10-CM | POA: Diagnosis not present

## 2018-01-27 DIAGNOSIS — Z0000 Encounter for general adult medical examination without abnormal findings: Secondary | ICD-10-CM

## 2018-01-27 DIAGNOSIS — I2721 Secondary pulmonary arterial hypertension: Secondary | ICD-10-CM | POA: Diagnosis not present

## 2018-01-27 DIAGNOSIS — H9191 Unspecified hearing loss, right ear: Secondary | ICD-10-CM | POA: Diagnosis not present

## 2018-01-27 DIAGNOSIS — I10 Essential (primary) hypertension: Secondary | ICD-10-CM

## 2018-01-27 DIAGNOSIS — E1142 Type 2 diabetes mellitus with diabetic polyneuropathy: Secondary | ICD-10-CM

## 2018-01-27 DIAGNOSIS — N184 Chronic kidney disease, stage 4 (severe): Secondary | ICD-10-CM | POA: Diagnosis not present

## 2018-01-27 DIAGNOSIS — I129 Hypertensive chronic kidney disease with stage 1 through stage 4 chronic kidney disease, or unspecified chronic kidney disease: Secondary | ICD-10-CM | POA: Diagnosis not present

## 2018-01-27 DIAGNOSIS — E7800 Pure hypercholesterolemia, unspecified: Secondary | ICD-10-CM | POA: Diagnosis not present

## 2018-01-27 DIAGNOSIS — G4733 Obstructive sleep apnea (adult) (pediatric): Secondary | ICD-10-CM

## 2018-01-27 DIAGNOSIS — E11649 Type 2 diabetes mellitus with hypoglycemia without coma: Secondary | ICD-10-CM

## 2018-01-27 DIAGNOSIS — Z7185 Encounter for immunization safety counseling: Secondary | ICD-10-CM

## 2018-01-27 DIAGNOSIS — N2581 Secondary hyperparathyroidism of renal origin: Secondary | ICD-10-CM | POA: Diagnosis not present

## 2018-01-27 DIAGNOSIS — D631 Anemia in chronic kidney disease: Secondary | ICD-10-CM | POA: Diagnosis not present

## 2018-01-27 MED ORDER — MIRABEGRON ER 50 MG PO TB24: 50.0000 mg | ORAL_TABLET | Freq: Every day | ORAL | 5 refills | Status: DC

## 2018-01-27 NOTE — Patient Instructions (Addendum)
Stop the glimepride. Your hemoglobin A1c is 6.2%.  Continue to check your blood sugars.

## 2018-01-31 LAB — CYTOLOGY - PAP
DIAGNOSIS: NEGATIVE
HPV: NOT DETECTED

## 2018-03-02 ENCOUNTER — Telehealth: Payer: Self-pay | Admitting: Internal Medicine

## 2018-03-02 NOTE — Telephone Encounter (Signed)
I would prefer to keep her on Myrbetriq if possible because the oxybutynin is an anticholinergic and has more possible side effects that she does not need to deal with. I will have a month supply for her to pick up at the front. Let's see if we can keep her on this.

## 2018-03-02 NOTE — Telephone Encounter (Signed)
Pt called and states that her insurance is $300 for myrbetriq and it went down to $47 with saving coupon but her insurance called her to let her know that she can get xybutynin chloride tablet for $11.00. Would you switch her to this. Send to [facility] [address]

## 2018-03-03 MED ORDER — MIRABEGRON ER 50 MG PO TB24: 50.0000 mg | ORAL_TABLET | Freq: Every day | ORAL | 0 refills | Status: DC

## 2018-03-03 NOTE — Telephone Encounter (Signed)
Pt was ok will staying on med if we can provide samples. Pt will stop by

## 2018-03-09 ENCOUNTER — Telehealth: Payer: Self-pay | Admitting: Family Medicine

## 2018-03-09 ENCOUNTER — Ambulatory Visit (INDEPENDENT_AMBULATORY_CARE_PROVIDER_SITE_OTHER): Payer: Medicare PPO | Admitting: *Deleted

## 2018-03-09 DIAGNOSIS — I428 Other cardiomyopathies: Secondary | ICD-10-CM

## 2018-03-09 DIAGNOSIS — I5022 Chronic systolic (congestive) heart failure: Secondary | ICD-10-CM

## 2018-03-09 NOTE — Progress Notes (Signed)
Remote ICD transmission.   

## 2018-03-09 NOTE — Telephone Encounter (Signed)
[place] still went thru for $47,  Called pt and she wants to continue taking this.  Faxed form back to [facility] that [name] does not want to change pt to Oxybutynin due to it is an anticholinergic and has more possible side effects that our patient does not need to deal with.

## 2018-03-11 ENCOUNTER — Encounter: Payer: Self-pay | Admitting: Cardiology

## 2018-03-11 ENCOUNTER — Encounter: Payer: Self-pay | Admitting: Podiatry

## 2018-03-11 ENCOUNTER — Ambulatory Visit: Payer: Medicare PPO | Admitting: Podiatry

## 2018-03-11 VITALS — BP 134/67 | HR 50

## 2018-03-11 DIAGNOSIS — B353 Tinea pedis: Secondary | ICD-10-CM | POA: Diagnosis not present

## 2018-03-11 DIAGNOSIS — M79675 Pain in left toe(s): Secondary | ICD-10-CM

## 2018-03-11 DIAGNOSIS — M79674 Pain in right toe(s): Secondary | ICD-10-CM

## 2018-03-11 DIAGNOSIS — L84 Corns and callosities: Secondary | ICD-10-CM

## 2018-03-11 DIAGNOSIS — B351 Tinea unguium: Secondary | ICD-10-CM | POA: Diagnosis not present

## 2018-03-11 DIAGNOSIS — E1142 Type 2 diabetes mellitus with diabetic polyneuropathy: Secondary | ICD-10-CM

## 2018-03-11 MED ORDER — KETOCONAZOLE 2 % EX CREA: 1.0000 "application " | TOPICAL_CREAM | Freq: Every day | CUTANEOUS | 1 refills | Status: AC

## 2018-03-11 NOTE — Patient Instructions (Signed)

## 2018-03-13 NOTE — Progress Notes (Signed)
Subjective: [name] is a 77 yo AAF accompanied to clinic visit by her daughter on today. She presents with diabetes, diabetic neuropathy and cc of painful, discolored, thick toenails and painful calluses which interfere with activities of daily living. Duration is long standing. Pain is aggravated when wearing enclosed shoe gear.  Daughter denies any history of foot wounds.   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[place])  Date Unknown Pulmonary HTN ([place])  Date Unknown Type 2 diabetes, controlled, with peripheral neuropathy ([place])  Date Unknown Urge incontinence of urine   Problem List   Cardiovascular and Mediastinum  Essential hypertension   CARDIOMYOPATHY   Other cardiomyopathies (HCC)   Chronic systolic heart failure (HCC)   CAD (coronary artery disease)   PAH (pulmonary artery hypertension) (HCC)   PAT (paroxysmal atrial tachycardia) (HCC)   Respiratory  COPD (chronic obstructive pulmonary disease) (HCC)   OSA on CPAP   Digestive  Malignant neoplasm of rectum (HCC)   Endocrine  Type 2 diabetes, controlled, with peripheral neuropathy (HCC)   Genitourinary  Chronic kidney disease   CKD (chronic kidney disease) stage 4, GFR 15-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/min (HCC)   Other  DEPRESSION   Cardiac defibrillator in situ   Urge incontinence of urine   Hypercholesterolemia    Surgical History   11/28/2009 Upper gi endoscopy    Medications    amLODipine (NORVASC) 10 MG tablet    aspirin 81 MG tablet    Blood Glucose Monitoring Suppl (ACCU-CHEK GUIDE) w/Device KIT    carvedilol (COREG) 25 MG tablet    DIGOX 125 MCG tablet    ferrous sulfate 325 (65 FE) MG tablet     furosemide (LASIX) 40 MG tablet    Glucose Blood (BLOOD GLUCOSE TEST STRIPS) STRP    mirabegron ER (MYRBETRIQ) 50 MG TB24 tablet    NON FORMULARY    OXYGEN    terazosin (HYTRIN) 2 MG capsule    Vitamin D, Ergocalciferol, (DRISDOL) 50000 units CAPS capsule    ketoconazole (NIZORAL) 2 % cream    Allergies     Lotrel [Amlodipine Besy-benazepril Hcl]  Talwin [Pentazocine]   Tobacco History Collapse by Default  Collapse by Default     Smoking Status  Former Smoker  Smokeless Tobacco Status  Never Used    Family History   None   ROS: Per HPI unless specifically indicated in ROS section   Objective: Vitals:   03/11/18 0928  BP: 134/67  Pulse: (!) 50   Vascular Examination: Capillary refill time <3 seconds x 10 digits Dorsalis pedis pulses palpable b/l  Posterior tibial pulses nonpalpable b/l No digital hair x 10 digits Skin temperature gradient WNL b/l  Dermatological Examination: Skin with normal turgor, texture and tone b/l Toenails 1-5 b/l discolored, thick, dystrophic with subungual debris and pain with palpation to nailbeds due to thickness of nails. Hyperkeratotic lesion submetatarsal head 5 b/l, medial 1st metatarsal head right foot  Mild diffuse scaling noted plantarly and peripherally b/l with mild foot odor.  Musculoskeletal: Muscle strength 5/5 to all LE muscle groups HAV with bunion deformity b/l  Neurological: Sensation diminished with 10 gram monofilament. Vibratory sensation diminished  Assessment: 1. Painful onychomycosis toenails  1-5 b/l 2. Calluses x 3:submetatarsal head 5 b/l, medial 1st metatarsal head right foot 3. NIDDM with Diabetic neuropathy 4. Tinea Pedis  Plan: 1. Discussed diagnoses and treatment options. 2. Discussed diabetic foot care principles. Written literature dispensed. 3. Toenails 1-5 b/l were debrided in length and girth without iatrogenic bleeding. Rx written for [facility] compound pharmacy topical formulation for  onychomycosis 4. For tinea pedis, Ketoconazole Cream to be applied to both feet once daily. 5. Hyperkeratotic lesion pared submetatarsal head 5 b/l, medial 1st metatarsal head right foot 6. Patient to continue soft, supportive shoe gear 7. Patient to report any pedal injuries to medical professional  8. Follow up 3 months. Patient/POA to call should there be a concern in the interim.

## 2018-03-14 ENCOUNTER — Ambulatory Visit (AMBULATORY_SURGERY_CENTER): Payer: Self-pay

## 2018-03-14 ENCOUNTER — Encounter: Payer: Self-pay | Admitting: Podiatry

## 2018-03-14 VITALS — Ht 64.0 in | Wt 182.0 lb

## 2018-03-14 DIAGNOSIS — D509 Iron deficiency anemia, unspecified: Secondary | ICD-10-CM

## 2018-03-14 MED ORDER — NA SULFATE-K SULFATE-MG SULF 17.5-3.13-1.6 GM/177ML PO SOLN: 1.0000 | Freq: Once | ORAL | 0 refills | Status: AC

## 2018-03-14 NOTE — Progress Notes (Signed)
No egg or soy allergy known to patient  No issues with past sedation with any surgeries  or procedures, no intubation problems  No diet pills per patient  Home 02  2 liters at nightuse per patient  No blood thinners per patient  Pt denies issues with constipation  No A fib or A flutter  EMMI video sent to pt's e mail . Pt declined

## 2018-03-15 ENCOUNTER — Telehealth: Payer: Self-pay

## 2018-03-15 ENCOUNTER — Other Ambulatory Visit: Payer: Medicare PPO

## 2018-03-15 NOTE — Telephone Encounter (Signed)
Multiple attempts have been made to obtain records of colonoscopy from Dr. Asencion Islam with [facility]. And with [facility]. According to Dr. Rosana Berger' office pt had a colon scheduled in 2016 that was cancelled and another scheduled in 2018 that was cancelled.  I called and let the pt know what I verified again.  She then indicated that it must have been with another provider but she couldn't remember who.

## 2018-03-29 ENCOUNTER — Ambulatory Visit (HOSPITAL_COMMUNITY): Payer: Medicare PPO | Admitting: Anesthesiology

## 2018-03-29 ENCOUNTER — Ambulatory Visit (HOSPITAL_COMMUNITY)
Admission: RE | Admit: 2018-03-29 | Discharge: 2018-03-29 | Disposition: A | Discharge status: Home / Self Care | Payer: Medicare PPO | Source: Ambulatory Visit | Attending: Gastroenterology | Admitting: Gastroenterology

## 2018-03-29 ENCOUNTER — Other Ambulatory Visit: Payer: Self-pay

## 2018-03-29 ENCOUNTER — Encounter (HOSPITAL_COMMUNITY): Payer: Self-pay | Admitting: *Deleted

## 2018-03-29 ENCOUNTER — Encounter (HOSPITAL_COMMUNITY)
Admission: RE | Disposition: A | Discharge status: Home / Self Care | Payer: Self-pay | Source: Ambulatory Visit | Attending: Gastroenterology

## 2018-03-29 DIAGNOSIS — I5022 Chronic systolic (congestive) heart failure: Secondary | ICD-10-CM | POA: Insufficient documentation

## 2018-03-29 DIAGNOSIS — Z79899 Other long term (current) drug therapy: Secondary | ICD-10-CM | POA: Diagnosis not present

## 2018-03-29 DIAGNOSIS — K2950 Unspecified chronic gastritis without bleeding: Secondary | ICD-10-CM | POA: Diagnosis not present

## 2018-03-29 DIAGNOSIS — D126 Benign neoplasm of colon, unspecified: Secondary | ICD-10-CM

## 2018-03-29 DIAGNOSIS — Z7982 Long term (current) use of aspirin: Secondary | ICD-10-CM | POA: Diagnosis not present

## 2018-03-29 DIAGNOSIS — N184 Chronic kidney disease, stage 4 (severe): Secondary | ICD-10-CM | POA: Insufficient documentation

## 2018-03-29 DIAGNOSIS — D123 Benign neoplasm of transverse colon: Secondary | ICD-10-CM | POA: Diagnosis not present

## 2018-03-29 DIAGNOSIS — Q438 Other specified congenital malformations of intestine: Secondary | ICD-10-CM | POA: Diagnosis not present

## 2018-03-29 DIAGNOSIS — Z8673 Personal history of transient ischemic attack (TIA), and cerebral infarction without residual deficits: Secondary | ICD-10-CM | POA: Insufficient documentation

## 2018-03-29 DIAGNOSIS — G4733 Obstructive sleep apnea (adult) (pediatric): Secondary | ICD-10-CM | POA: Insufficient documentation

## 2018-03-29 DIAGNOSIS — J449 Chronic obstructive pulmonary disease, unspecified: Secondary | ICD-10-CM | POA: Insufficient documentation

## 2018-03-29 DIAGNOSIS — K5909 Other constipation: Secondary | ICD-10-CM | POA: Diagnosis not present

## 2018-03-29 DIAGNOSIS — Z7984 Long term (current) use of oral hypoglycemic drugs: Secondary | ICD-10-CM | POA: Insufficient documentation

## 2018-03-29 DIAGNOSIS — Z95810 Presence of automatic (implantable) cardiac defibrillator: Secondary | ICD-10-CM | POA: Diagnosis not present

## 2018-03-29 DIAGNOSIS — Z87891 Personal history of nicotine dependence: Secondary | ICD-10-CM | POA: Diagnosis not present

## 2018-03-29 DIAGNOSIS — D124 Benign neoplasm of descending colon: Secondary | ICD-10-CM | POA: Diagnosis not present

## 2018-03-29 DIAGNOSIS — Z1211 Encounter for screening for malignant neoplasm of colon: Secondary | ICD-10-CM

## 2018-03-29 DIAGNOSIS — D509 Iron deficiency anemia, unspecified: Secondary | ICD-10-CM

## 2018-03-29 DIAGNOSIS — Z9981 Dependence on supplemental oxygen: Secondary | ICD-10-CM

## 2018-03-29 DIAGNOSIS — D122 Benign neoplasm of ascending colon: Secondary | ICD-10-CM | POA: Diagnosis not present

## 2018-03-29 DIAGNOSIS — I2510 Atherosclerotic heart disease of native coronary artery without angina pectoris: Secondary | ICD-10-CM | POA: Diagnosis not present

## 2018-03-29 DIAGNOSIS — D125 Benign neoplasm of sigmoid colon: Secondary | ICD-10-CM | POA: Insufficient documentation

## 2018-03-29 DIAGNOSIS — K3189 Other diseases of stomach and duodenum: Secondary | ICD-10-CM | POA: Diagnosis not present

## 2018-03-29 DIAGNOSIS — K2970 Gastritis, unspecified, without bleeding: Secondary | ICD-10-CM | POA: Diagnosis not present

## 2018-03-29 DIAGNOSIS — I272 Pulmonary hypertension, unspecified: Secondary | ICD-10-CM | POA: Diagnosis not present

## 2018-03-29 DIAGNOSIS — I130 Hypertensive heart and chronic kidney disease with heart failure and stage 1 through stage 4 chronic kidney disease, or unspecified chronic kidney disease: Secondary | ICD-10-CM | POA: Diagnosis not present

## 2018-03-29 DIAGNOSIS — E1122 Type 2 diabetes mellitus with diabetic chronic kidney disease: Secondary | ICD-10-CM | POA: Diagnosis not present

## 2018-03-29 HISTORY — PX: COLONOSCOPY WITH PROPOFOL: SHX5780

## 2018-03-29 HISTORY — PX: POLYPECTOMY: SHX5525

## 2018-03-29 HISTORY — PX: ESOPHAGOGASTRODUODENOSCOPY (EGD) WITH PROPOFOL: SHX5813

## 2018-03-29 HISTORY — PX: BIOPSY: SHX5522

## 2018-03-29 LAB — GLUCOSE, CAPILLARY: Glucose-Capillary: 100 mg/dL — ABNORMAL HIGH (ref 70–99)

## 2018-03-29 SURGERY — COLONOSCOPY WITH PROPOFOL
Anesthesia: Monitor Anesthesia Care

## 2018-03-29 MED ORDER — LIDOCAINE 2% (20 MG/ML) 5 ML SYRINGE
INTRAMUSCULAR | Status: DC | PRN
  Administered 2018-03-29: 50 mg via INTRAVENOUS

## 2018-03-29 MED ORDER — LACTATED RINGERS IV SOLN
INTRAVENOUS | Status: DC
  Administered 2018-03-29: 1000 mL via INTRAVENOUS

## 2018-03-29 MED ORDER — PROPOFOL 10 MG/ML IV BOLUS
INTRAVENOUS | Status: DC | PRN
  Administered 2018-03-29 (×2): 10 mg via INTRAVENOUS
  Administered 2018-03-29: 20 mg via INTRAVENOUS

## 2018-03-29 MED ORDER — PROPOFOL 500 MG/50ML IV EMUL
INTRAVENOUS | Status: DC | PRN
  Administered 2018-03-29: 150 ug/kg/min via INTRAVENOUS

## 2018-03-29 MED ORDER — SODIUM CHLORIDE 0.9 % IV SOLN: INTRAVENOUS | Status: DC

## 2018-03-29 MED ORDER — PROPOFOL 10 MG/ML IV BOLUS
INTRAVENOUS | Status: AC
  Filled 2018-03-29: qty 20

## 2018-03-29 MED ORDER — ONDANSETRON HCL 4 MG/2ML IJ SOLN
INTRAMUSCULAR | Status: DC | PRN
  Administered 2018-03-29: 4 mg via INTRAVENOUS

## 2018-03-29 MED ORDER — PROPOFOL 10 MG/ML IV BOLUS
INTRAVENOUS | Status: AC
  Filled 2018-03-29: qty 40

## 2018-03-29 SURGICAL SUPPLY — 24 items

## 2018-03-29 NOTE — Op Note (Signed)
[facility] Patient Name: [name] Procedure Date: 03/29/2018 MRN: [id number] Attending MD: Carlota Raspberry. Havery Moros , MD Date of Birth: Dec 13, 1940 CSN: [id number] Age: 77 Admit Type: Outpatient Procedure:                Upper GI endoscopy Indications:              Iron deficiency anemia Providers:                Remo Lipps P. Havery Moros, MD, Angus Seller, Elspeth Cho Tech., Technician, Virgia Land, CRNA Referring MD:              Medicines:                Monitored Anesthesia Care Complications:            No immediate complications. Estimated blood loss:                            Minimal. Estimated Blood Loss:     Estimated blood loss was minimal. Procedure:                Pre-Anesthesia Assessment:                           - Prior to the procedure, a History and Physical                            was performed, and patient medications and                            allergies were reviewed. The patient's tolerance of                            previous anesthesia was also reviewed. The risks                            and benefits of the procedure and the sedation                            options and risks were discussed with the patient.                            All questions were answered, and informed consent                            was obtained. Prior Anticoagulants: The patient has                            taken no previous anticoagulant or antiplatelet                            agents. ASA Grade Assessment: III - A patient with  severe systemic disease. After reviewing the risks                            and benefits, the patient was deemed in                            satisfactory condition to undergo the procedure.                           After obtaining informed consent, the endoscope was                            passed under direct vision. Throughout the                             procedure, the patient's blood pressure, pulse, and                            oxygen saturations were monitored continuously. The                            GIF-H190 ([id number]) Olympus adult endoscope was                            introduced through the mouth, and advanced to the                            second part of duodenum. The upper GI endoscopy was                            accomplished without difficulty. The patient                            tolerated the procedure well. Scope In: Scope Out: Findings:      Esophagogastric landmarks were identified: the Z-line was found at 36       cm, the gastroesophageal junction was found at 36 cm and the upper       extent of the gastric folds was found at 36 cm from the incisors.      The exam of the esophagus was otherwise normal.      The entire examined stomach was normal. Biopsies were taken from the       antrum, body, and incisura with a cold forceps for Helicobacter pylori       testing.      The duodenal bulb and second portion of the duodenum were normal. The       duodenal sweep was quite angulated and difficult to visualize. Impression:               - Esophagogastric landmarks identified.                           - Normal esophagus.                           - Normal stomach. Biopsied to rule out H pylori  given iron deficiency.                           - Normal duodenal bulb and second portion of the                            duodenum, angulated duodenal sweep                           No obvious cause for iron deficiency on this exam. Moderate Sedation:      No moderate sedation, case performed with MAC Recommendation:           - Patient has a contact number available for                            emergencies. The signs and symptoms of potential                            delayed complications were discussed with the                            patient. Return to normal activities tomorrow.                             Written discharge instructions were provided to the                            patient.                           - Resume previous diet.                           - Continue present medications.                           - Await pathology results.                           - Repeat CBC and iron studies. Consideration for                            capsule endoscopy pending the patient's course and                            repeat labs Procedure Code(s):        --- Professional ---                           [phone], Esophagogastroduodenoscopy, flexible,                            transoral; with biopsy, single or multiple Diagnosis Code(s):        --- Professional ---                           D50.9,  Iron deficiency anemia, unspecified CPT copyright 2017 American Medical Association. All rights reserved. The codes documented in this report are preliminary and upon coder review may  be revised to meet current compliance requirements. Remo Lipps P. Arryn Terrones, MD 03/29/2018 10:57:43 AM This report has been signed electronically. Number of Addenda: 0

## 2018-03-29 NOTE — Anesthesia Preprocedure Evaluation (Signed)
Anesthesia Evaluation  Patient identified by MRN, date of birth, ID band Patient awake    Reviewed: Allergy & Precautions, NPO status , Patient's Chart, lab work & pertinent test results  Airway Mallampati: II  TM Distance: >3 FB Neck ROM: Full    Dental   Pulmonary sleep apnea , COPD,  oxygen dependent, former smoker,    breath sounds clear to auscultation       Cardiovascular hypertension, Pt. on medications + CAD and +CHF   Rhythm:Regular Rate:Normal     Neuro/Psych Depression  Neuromuscular disease CVA    GI/Hepatic negative GI ROS, Neg liver ROS,   Endo/Other  diabetes, Type 2, Oral Hypoglycemic Agents  Renal/GU CRFRenal disease     Musculoskeletal   Abdominal   Peds  Hematology  (+) anemia ,   Anesthesia Other Findings   Reproductive/Obstetrics                             Anesthesia Physical Anesthesia Plan  ASA: IV  Anesthesia Plan: MAC   Post-op Pain Management:    Induction: Intravenous  PONV Risk Score and Plan: 2 and Propofol infusion, Ondansetron and Treatment may vary due to age or medical condition  Airway Management Planned: Natural Airway and Simple Face Mask  Additional Equipment:   Intra-op Plan:   Post-operative Plan:   Informed Consent: I have reviewed the patients History and Physical, chart, labs and discussed the procedure including the risks, benefits and alternatives for the proposed anesthesia with the patient or authorized representative who has indicated his/her understanding and acceptance.     Plan Discussed with: CRNA  Anesthesia Plan Comments:         Anesthesia Quick Evaluation

## 2018-03-29 NOTE — H&P (Signed)
HPI:   [name] is a 77 y.o. female here for EGD and colonoscopy to evaluate iron deficiency anemia. Last Hgb in the 10s. She denies any changes in health status since I've last seen her. No obvious blood in the stools. She does wear supplemental oxygen, no respiratory complaints. Overall feeling well at present.  Past Medical History:  Diagnosis Date  . Anemia   . CAD (coronary artery disease)   . Cardiac defibrillator in situ   . Cataract    bilateral 2017  . CHF (congestive heart failure) ([place])   . Chronic systolic heart failure ([place])   . CKD (chronic kidney disease) stage 4, GFR 15-29 ml/min (HCC)   . COPD (chronic obstructive pulmonary disease) ([place])   . HTN (hypertension)   . 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 Rollene Rotunda in [place]. Per notes she was due for colonoscopy in 12/2016  . OSA on CPAP 09/08/2017  . Other cardiomyopathies ([place])   . Oxygen deficiency    2 liters at night  . Pulmonary HTN ([place])   . Sleep apnea    uses c-pap  . Stroke ([place])   . 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 Urge incontinence of urine     Past Surgical History:  Procedure Laterality Date  . COLONOSCOPY    . TONSILLECTOMY AND ADENOIDECTOMY     age 55  . TUBAL LIGATION     years ago  . UPPER GASTROINTESTINAL ENDOSCOPY    . UPPER GI ENDOSCOPY  11/28/2009   mild erosive esophagitis, mild erosive changes in stomach, chronic constipation    Family History  Problem Relation Age of Onset  . Colon polyps Daughter   . Colon cancer Neg Hx   . Esophageal cancer Neg Hx   . Rectal cancer Neg Hx   . Stomach cancer Neg Hx      Social History   Tobacco Use  . Smoking status: Former Smoker    Last attempt to quit: 03/14/2009    Years since quitting: 9.0  . Smokeless tobacco: Never Used  . Tobacco comment: quit smoking 2010  Substance Use Topics  . Alcohol use: No    Frequency: Never  . Drug use: No    Prior to Admission  medications   Medication Sig Start Date End Date Taking? Authorizing Provider  amLODipine (NORVASC) 10 MG tablet Take 10 mg by mouth daily.   Yes [provider]  aspirin 81 MG tablet Take 81 mg by mouth at bedtime.    Yes [provider]  Blood Glucose Monitoring Suppl (ACCU-CHEK GUIDE) w/Device KIT USE AS DIRECTED 12/20/17  Yes Henson, Vickie L, NP-C  carvedilol (COREG) 25 MG tablet Take 25 mg by mouth 2 (two) times daily with a meal. 06/19/17  Yes [provider]  [place]ox[place] 125 MCG tablet Take 0.125 mg by mouth daily.  08/31/17  Yes [provider]  ferrous sulfate 325 (65 FE) MG tablet Take 325 mg by mouth 2 (two) times daily with a meal.   Yes [provider]  furosemide (LASIX) 40 MG tablet Take 40 mg by mouth 2 (two) times daily.  06/19/17  Yes [provider]  Glucose Blood (BLOOD GLUCOSE TEST STRIPS) STRP Test once a day. Pt uses accu-chek guide dx.E11.9 12/01/17  Yes Henson, Vickie L, NP-C  ketoconazole (NIZORAL) 2 % cream Apply 1 application topically daily. 03/11/18 04/10/18  Yes Marzetta Board, MD  mirabegron ER (MYRBETRIQ) 50 MG TB24 tablet Take 1 tablet (50 mg total) by mouth daily. Patient taking differently: Take 50 mg by mouth 2 (two) times a week. Saturday and Sunday 03/03/18  Yes Henson, Laurian Brim, NP-C  NON FORMULARY [facility]  Onychomycosis Nail Lacquer -  Fluconazole 2%, Terbinafine 1% DMSO Apply to affected nail once daily Qty. 120 gm 3 refills   Yes [provider]  OXYGEN Inhale 2 L into the lungs at bedtime.    Yes [provider]  terazosin (HYTRIN) 2 MG capsule Take 2 mg by mouth at bedtime. 06/19/17  Yes [provider]  Vitamin D, Ergocalciferol, (DRISDOL) 50000 units CAPS capsule Take 50,000 Units by mouth every 7 (seven) days. Weekly for 12 weeks then monthly   Yes [provider]    Current Facility-Administered Medications  Medication Dose Route Frequency Provider Last  Rate Last Dose  . 0.9 %  sodium chloride infusion   Intravenous Continuous Orry Sigl, Carlota Raspberry, MD        Allergies as of 01/25/2018 - Review Complete 01/04/2018  Allergen Reaction Noted  . Amlodipine besy-benazepril hcl    . Talwin [pentazocine]  09/01/2017     Review of Systems:    As per HPI, otherwise negative    Physical Exam:  Vital signs in last 24 hours:     General:   Pleasant female in NAD Lungs:  Respirations even and unlabored. Lungs clear to auscultation bilaterally.   Heart:  Regular rate and rhythm; no MRG Abdomen:  Soft, nondistended, nontender. No appreciable masses or hepatomegaly.  Neurologic:  Alert and  oriented x4;  grossly normal neurologically.  Lab Results  Component Value Date   WBC 4.5 09/01/2017   HGB 11.1 09/01/2017   HCT 35.0 09/01/2017   MCV 87 09/01/2017   PLT 234 09/01/2017      Impression / Plan:   77 y/o female with multiple medical problems, here for EGD and colonoscopy to evaluate iron deficiency anemia. I have discussed risks / benefits of EGD and colonoscopy with the patient, she wishes to proceed. All questions answered.  [place] Cellar, MD [facility]

## 2018-03-29 NOTE — Discharge Instructions (Signed)
Esophagogastroduodenoscopy, Care After Refer to this sheet in the next few weeks. These instructions provide you with information about caring for yourself after your procedure. Your health care provider may also give you more specific instructions. Your treatment has been planned according to current medical practices, but problems sometimes occur. Call your health care provider if you have any problems or questions after your procedure. What can I expect after the procedure? After the procedure, it is common to have:  A sore throat.  Nausea.  Bloating.  Dizziness.  Fatigue.  Follow these instructions at home:  Do not eat or drink anything until the numbing medicine (local anesthetic) has worn off and your gag reflex has returned. You will know that the local anesthetic has worn off when you can swallow comfortably.  Do not drive for 24 hours if you received a medicine to help you relax (sedative).  If your health care provider took a tissue sample for testing during the procedure, make sure to get your test results. This is your responsibility. Ask your health care provider or the department performing the test when your results will be ready.  Keep all follow-up visits as told by your health care provider. This is important. Contact a health care provider if:  You cannot stop coughing.  You are not urinating.  You are urinating less than usual. Get help right away if:  You have trouble swallowing.  You cannot eat or drink.  You have throat or chest pain that gets worse.  You are dizzy or light-headed.  You faint.  You have nausea or vomiting.  You have chills.  You have a fever.  You have severe abdominal pain.  You have black, tarry, or bloody stools. This information is not intended to replace advice given to you by your health care provider. Make sure you discuss any questions you have with your health care provider. Document Released: 06/22/2012 Document  Revised: 12/12/2015 Document Reviewed: 05/30/2015 Elsevier Interactive Patient Education  2018 [employer]. Colonoscopy, Adult, Care After This sheet gives you information about how to care for yourself after your procedure. Your doctor may also give you more specific instructions. If you have problems or questions, call your doctor. Follow these instructions at home: General instructions   For the first 24 hours after the procedure: ? Do not drive or use machinery. ? Do not sign important documents. ? Do not drink alcohol. ? Do your daily activities more slowly than normal. ? Eat foods that are soft and easy to digest. ? Rest often.  Take over-the-counter or prescription medicines only as told by your doctor.  It is up to you to get the results of your procedure. Ask your doctor, or the department performing the procedure, when your results will be ready. To help cramping and bloating:  Try walking around.  Put heat on your belly (abdomen) as told by your doctor. Use a heat source that your doctor recommends, such as a moist heat pack or a heating pad. ? Put a towel between your skin and the heat source. ? Leave the heat on for 20-30 minutes. ? Remove the heat if your skin turns bright red. This is especially important if you cannot feel pain, heat, or cold. You can get burned. Eating and drinking  Drink enough fluid to keep your pee (urine) clear or pale yellow.  Return to your normal diet as told by your doctor. Avoid heavy or fried foods that are hard to digest.  Avoid  drinking alcohol for as long as told by your doctor. Contact a doctor if:  You have blood in your poop (stool) 2-3 days after the procedure. Get help right away if:  You have more than a small amount of blood in your poop.  You see large clumps of tissue (blood clots) in your poop.  Your belly is swollen.  You feel sick to your stomach (nauseous).  You throw up (vomit).  You have a fever.  You  have belly pain that gets worse, and medicine does not help your pain. This information is not intended to replace advice given to you by your health care provider. Make sure you discuss any questions you have with your health care provider. Document Released: 08/08/2010 Document Revised: 03/30/2016 Document Reviewed: 03/30/2016 Elsevier Interactive Patient Education  2017 [employer].

## 2018-03-29 NOTE — Interval H&P Note (Signed)
History and Physical Interval Note:  03/29/2018 8:58 AM  [name]  has presented today for surgery, with the diagnosis of IDA/Colon Cancer Screening/Home O2 use/jmh  The various methods of treatment have been discussed with the patient and family. After consideration of risks, benefits and other options for treatment, the patient has consented to  Procedure(s): COLONOSCOPY WITH PROPOFOL (N/A) ESOPHAGOGASTRODUODENOSCOPY (EGD) WITH PROPOFOL (N/A) as a surgical intervention .  The patient's history has been reviewed, patient examined, no change in status, stable for surgery.  I have reviewed the patient's chart and labs.  Questions were answered to the patient's satisfaction.     [place]

## 2018-03-29 NOTE — Op Note (Signed)
[facility] Patient Name: [name] Procedure Date: 03/29/2018 MRN: [id number] Attending MD: Carlota Raspberry. Havery Moros , MD Date of Birth: Nov 29, 1940 CSN: [id number] Age: 77 Admit Type: Outpatient Procedure:                Colonoscopy Indications:              Iron deficiency anemia Providers:                Remo Lipps P. Havery Moros, MD, Angus Seller, Elspeth Cho Tech., Technician, Virgia Land, CRNA Referring MD:              Medicines:                Monitored Anesthesia Care Complications:            No immediate complications. Estimated blood loss:                            Minimal. Estimated Blood Loss:     Estimated blood loss was minimal. Procedure:                Pre-Anesthesia Assessment:                           - Prior to the procedure, a History and Physical                            was performed, and patient medications and                            allergies were reviewed. The patient's tolerance of                            previous anesthesia was also reviewed. The risks                            and benefits of the procedure and the sedation                            options and risks were discussed with the patient.                            All questions were answered, and informed consent                            was obtained. Prior Anticoagulants: The patient has                            taken no previous anticoagulant or antiplatelet                            agents. ASA Grade Assessment: III - A patient with  severe systemic disease. After reviewing the risks                            and benefits, the patient was deemed in                            satisfactory condition to undergo the procedure.                           After obtaining informed consent, the colonoscope                            was passed under direct vision. Throughout the   procedure, the patient's blood pressure, pulse, and                            oxygen saturations were monitored continuously. The                            PCF-H190DL ([id number]) Olympus peds colonscope was                            introduced through the anus and advanced to the the                            terminal ileum, with identification of the                            appendiceal orifice and IC valve. The colonoscopy                            was technically difficult and complex due to                            significant looping. The patient tolerated the                            procedure well. The quality of the bowel                            preparation was adequate. The terminal ileum,                            ileocecal valve, appendiceal orifice, and rectum                            were photographed. Scope In: 10:02:52 AM Scope Out: 10:46:57 AM Scope Withdrawal Time: 0 hours 30 minutes 0 seconds  Total Procedure Duration: 0 hours 44 minutes 5 seconds  Findings:      The perianal and digital rectal examinations were normal.      The terminal ileum appeared normal.      The colon was extremely tortuous with significant looping. The colon       also did not retain air well which led to significant spasm - these  factors significantly prolonged the procedure.      A diminutive polyp was found in the ascending colon. The polyp was       sessile. The polyp was removed with a cold biopsy forceps. Resection and       retrieval were complete.      A 4 mm polyp was found in the transverse colon. The polyp was sessile.       The polyp was removed with a cold snare. Resection and retrieval were       complete.      A 3 mm polyp was found in the splenic flexure. The polyp was sessile.       The polyp was removed with a cold snare. Resection and retrieval were       complete.      A 4 mm polyp was found in the descending colon. The polyp was sessile.       The  polyp was removed with a cold snare. Resection and retrieval were       complete.      Two sessile polyps were found in the sigmoid colon. The polyps were 3 to       4 mm in size. These polyps were removed with a cold snare. Resection and       retrieval were complete.      The prep was overall adequate however certain areas in the dependant       portions of the colon contained retained vegetable matter which took       several minutes to clear and lavage. The exam was otherwise without       abnormality. Impression:               - The examined portion of the ileum was normal.                           - Tortuous colon with looping, poor retention of                            air associated with significant spasm which                            prolonged the procedure.                           - One diminutive polyp in the ascending colon,                            removed with a cold biopsy forceps. Resected and                            retrieved.                           - One 4 mm polyp in the transverse colon, removed                            with a cold snare. Resected and retrieved.                           -  One 3 mm polyp at the splenic flexure, removed                            with a cold snare. Resected and retrieved.                           - One 4 mm polyp in the descending colon, removed                            with a cold snare. Resected and retrieved.                           - Two 3 to 4 mm polyps in the sigmoid colon,                            removed with a cold snare. Resected and retrieved.                           - The examination was otherwise normal.                           No obvious cause for iron deficiency on this                            examination. Moderate Sedation:      No moderate sedation, case performed with MAC Recommendation:           - Patient has a contact number available for                            emergencies. The  signs and symptoms of potential                            delayed complications were discussed with the                            patient. Return to normal activities tomorrow.                            Written discharge instructions were provided to the                            patient.                           - Resume previous diet.                           - Continue present medications.                           - Await pathology results.                           - Repeat CBC with iron studies Procedure Code(s):        ---  Professional ---                           [phone], Colonoscopy, flexible; with removal of                            tumor(s), polyp(s), or other lesion(s) by snare                            technique                           [id number], 59, Colonoscopy, flexible; with biopsy,                            single or multiple Diagnosis Code(s):        --- Professional ---                           D12.2, Benign neoplasm of ascending colon                           D12.3, Benign neoplasm of transverse colon (hepatic                            flexure or splenic flexure)                           D12.4, Benign neoplasm of descending colon                           D12.5, Benign neoplasm of sigmoid colon                           D50.9, Iron deficiency anemia, unspecified                           Q43.8, Other specified congenital malformations of                            intestine CPT copyright 2017 American Medical Association. All rights reserved. The codes documented in this report are preliminary and upon coder review may  be revised to meet current compliance requirements. Remo Lipps P. Armbruster, MD 03/29/2018 10:54:32 AM This report has been signed electronically. Number of Addenda: 0

## 2018-03-29 NOTE — Anesthesia Postprocedure Evaluation (Signed)
Anesthesia Post Note  Patient: [name]  Procedure(s) Performed: COLONOSCOPY WITH PROPOFOL (N/A ) ESOPHAGOGASTRODUODENOSCOPY (EGD) WITH PROPOFOL (N/A ) BIOPSY POLYPECTOMY     Patient location during evaluation: PACU Anesthesia Type: MAC Level of consciousness: awake and alert Pain management: pain level controlled Vital Signs Assessment: post-procedure vital signs reviewed and stable Respiratory status: spontaneous breathing, nonlabored ventilation, respiratory function stable and patient connected to nasal cannula oxygen Cardiovascular status: stable and blood pressure returned to baseline Postop Assessment: no apparent nausea or vomiting Anesthetic complications: no    Last Vitals:  Vitals:   03/29/18 1105 03/29/18 1110  BP: (!) 113/51 110/61  Pulse:    Resp: 20 18  Temp:    SpO2: 100% 100%    Last Pain:  Vitals:   03/29/18 1055  TempSrc:   PainSc: 0-No pain                 Tiajuana Amass

## 2018-03-29 NOTE — Transfer of Care (Signed)
Immediate Anesthesia Transfer of Care Note  Patient: [name]  Procedure(s) Performed: COLONOSCOPY WITH PROPOFOL (N/A ) ESOPHAGOGASTRODUODENOSCOPY (EGD) WITH PROPOFOL (N/A ) BIOPSY POLYPECTOMY  Patient Location: PACU  Anesthesia Type:MAC  Level of Consciousness: awake, alert  and oriented  Airway & Oxygen Therapy: Patient Spontanous Breathing and Patient connected to face mask oxygen  Post-op Assessment: Report given to RN and Post -op Vital signs reviewed and stable  Post vital signs: Reviewed and stable  Last Vitals:  Vitals Value Taken Time  BP 118/48 03/29/2018 10:55 AM  Temp    Pulse    Resp 17 03/29/2018 11:02 AM  SpO2    Vitals shown include unvalidated device data.  Last Pain:  Vitals:   03/29/18 0903  TempSrc: Oral  PainSc: 0-No pain         Complications: No apparent anesthesia complications

## 2018-03-31 ENCOUNTER — Other Ambulatory Visit: Payer: Self-pay | Admitting: Family Medicine

## 2018-03-31 ENCOUNTER — Encounter (HOSPITAL_COMMUNITY): Payer: Self-pay | Admitting: Gastroenterology

## 2018-03-31 ENCOUNTER — Other Ambulatory Visit: Payer: Self-pay

## 2018-03-31 DIAGNOSIS — D509 Iron deficiency anemia, unspecified: Secondary | ICD-10-CM

## 2018-03-31 DIAGNOSIS — Z1231 Encounter for screening mammogram for malignant neoplasm of breast: Secondary | ICD-10-CM

## 2018-04-06 ENCOUNTER — Ambulatory Visit
Admission: RE | Admit: 2018-04-06 | Discharge: 2018-04-06 | Disposition: A | Discharge status: Home / Self Care | Payer: Medicare PPO | Source: Ambulatory Visit | Attending: Family Medicine | Admitting: Family Medicine

## 2018-04-06 ENCOUNTER — Ambulatory Visit: Payer: Medicare PPO | Admitting: Orthotics

## 2018-04-06 ENCOUNTER — Ambulatory Visit: Payer: Medicare PPO | Admitting: Pulmonary Disease

## 2018-04-06 ENCOUNTER — Encounter: Payer: Self-pay | Admitting: Pulmonary Disease

## 2018-04-06 ENCOUNTER — Other Ambulatory Visit (INDEPENDENT_AMBULATORY_CARE_PROVIDER_SITE_OTHER): Payer: Medicare PPO

## 2018-04-06 VITALS — BP 130/60 | HR 67 | Ht 61.5 in | Wt 182.4 lb

## 2018-04-06 DIAGNOSIS — Z1231 Encounter for screening mammogram for malignant neoplasm of breast: Secondary | ICD-10-CM

## 2018-04-06 DIAGNOSIS — M79675 Pain in left toe(s): Principal | ICD-10-CM

## 2018-04-06 DIAGNOSIS — B351 Tinea unguium: Secondary | ICD-10-CM

## 2018-04-06 DIAGNOSIS — D509 Iron deficiency anemia, unspecified: Secondary | ICD-10-CM

## 2018-04-06 DIAGNOSIS — M79674 Pain in right toe(s): Principal | ICD-10-CM

## 2018-04-06 DIAGNOSIS — G4733 Obstructive sleep apnea (adult) (pediatric): Secondary | ICD-10-CM

## 2018-04-06 DIAGNOSIS — E1142 Type 2 diabetes mellitus with diabetic polyneuropathy: Secondary | ICD-10-CM

## 2018-04-06 DIAGNOSIS — Z23 Encounter for immunization: Secondary | ICD-10-CM | POA: Diagnosis not present

## 2018-04-06 DIAGNOSIS — L84 Corns and callosities: Secondary | ICD-10-CM

## 2018-04-06 DIAGNOSIS — J439 Emphysema, unspecified: Secondary | ICD-10-CM | POA: Diagnosis not present

## 2018-04-06 LAB — CBC WITH DIFFERENTIAL/PLATELET
Basophils Absolute: 0 10*3/uL (ref 0.0–0.1)
Basophils Relative: 0.5 % (ref 0.0–3.0)
EOS PCT: 2.7 % (ref 0.0–5.0)
Eosinophils Absolute: 0.1 10*3/uL (ref 0.0–0.7)
HEMATOCRIT: 33.7 % — AB (ref 36.0–46.0)
Hemoglobin: 10.9 g/dL — ABNORMAL LOW (ref 12.0–15.0)
LYMPHS PCT: 22.9 % (ref 12.0–46.0)
Lymphs Abs: 1.1 10*3/uL (ref 0.7–4.0)
MCHC: 32.5 g/dL (ref 30.0–36.0)
MCV: 84.9 fl (ref 78.0–100.0)
MONOS PCT: 11 % (ref 3.0–12.0)
Monocytes Absolute: 0.5 10*3/uL (ref 0.1–1.0)
NEUTROS ABS: 3.1 10*3/uL (ref 1.4–7.7)
NEUTROS PCT: 62.9 % (ref 43.0–77.0)
PLATELETS: 235 10*3/uL (ref 150.0–400.0)
RBC: 3.97 Mil/uL (ref 3.87–5.11)
RDW: 15.3 % (ref 11.5–15.5)
WBC: 4.9 10*3/uL (ref 4.0–10.5)

## 2018-04-06 LAB — FERRITIN: Ferritin: 255.8 ng/mL (ref 10.0–291.0)

## 2018-04-06 NOTE — Addendum Note (Signed)
Addended by: Raliegh Ip on: 04/06/2018 11:16 AM   Modules accepted: Orders

## 2018-04-06 NOTE — Progress Notes (Signed)
Casted patient; however holding off submitting order until patietn is seen by MD...not FNP.

## 2018-04-06 NOTE — Progress Notes (Signed)
[facility]  Chief Complaint  Patient presents with  . Follow-up    no complaints since last visit,  feels she is doing well.    Constitutional: BP 130/60 (BP Location: Left Arm, Patient Position: Sitting)   Pulse 67   Ht 5' 1.5" (1.562 m)   Wt 182 lb 6.4 oz (82.7 kg)   SpO2 96%   BMI 33.91 kg/m   History of Present Illness: [name] is a 77 y.o. female former smoker with OSA and emphysema.  Breathing okay.  Not having cough, wheeze, sputum, chest pain, hemoptysis, fever, leg swelling.  Gets winded if she walks too fast, but okay at steady pace.  Uses CPAP.  Feels mask is too big.  Otherwise no problem with sinus congestion, sore throat, dry mouth, or aerophagia.   Comprehensive Respiratory Exam:  Appearance - well kempt  ENMT - nasal mucosa moist, turbinates clear, midline nasal septum, no dental lesions, no gingival bleeding, no oral exudates, no tonsillar hypertrophy Neck - no masses, trachea midline, no thyromegaly, no elevation in JVP Respiratory - normal appearance of chest wall, normal respiratory effort w/o accessory muscle use, no dullness on percussion, no wheezing or rales CV - s1s2 regular rate and rhythm, no murmurs, no peripheral edema, radial pulses symmetric GI - soft, non tender, no masses Lymph - no adenopathy noted in neck and axillary areas MSK - normal muscle strength and tone, normal gait Ext - no cyanosis, clubbing, or joint inflammation noted Skin - no rashes, lesions, or ulcers Neuro - oriented to person, place, and time Psych - normal mood and affect  CXR 12/11/17 (reviewed by me) >> bronchitic changes, cardiomegaly  Assessment/Plan:  COPD with emphysema. - no significant symptoms - she doesn't feel she needs inhaler therapy at this time - high dose flu shot today  Obstructive sleep apnea. - she is compliant with CPAP and reports benefit - continue CPAP 18 cm H2O - she will contact her DME to  get her mask refitted   Patient Instructions  High dose flu shot today  Follow up in 1 year    [name], MD [place] 04/06/2018, 3:51 PM  Flow Sheet  Pulmonary tests: PFT 06/28/07 >> FEV1 0.73 (39%), FEV1% 92, TLC 2.41 (51%) PFT 05/04/17 >> FEV1 1.22 (79%), FEV1% 69, TLC 3.12 (75%), DLCO 39%, +BD  Sleep tests: PSG 10/02/13 >> AHI 59.6, SpO2 low 80% PSG 07/05/14 >> AHI 15.1, SpO2 low 76% CPAP 01/06/18 to 04/05/18 >> used on 90 of 90 nights with average 9 hrs 9 min.  Average AHI 8.2 with CPAP 18 cm H2O.  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  Past Surgical History: She  has a past surgical history that includes Upper gi endoscopy (11/28/2009); Upper gastrointestinal endoscopy; Colonoscopy; Tonsillectomy and adenoidectomy; Tubal ligation; Colonoscopy with propofol (N/A, 03/29/2018); Esophagogastroduodenoscopy (egd) with propofol (N/A, 03/29/2018); biopsy (03/29/2018); and polypectomy (03/29/2018).  Family History: She has family history of heart  disease.   Social History: She  reports that she quit smoking about 9 years ago. She has never used smokeless tobacco. She reports that she does not drink alcohol or use drugs.  Medications: Allergies as of 04/06/2018      Reactions   Lotrel [amlodipine Besy-benazepril Hcl]    Talwin [pentazocine]     Hallulate      Medication List        Accurate as of 04/06/18  3:51 PM. Always use your most recent med list.          ACCU-CHEK GUIDE w/Device Kit USE AS DIRECTED   amLODipine 10 MG tablet Commonly known as:  NORVASC Take 10 mg by mouth daily.   aspirin 81 MG tablet Take 81 mg by mouth at bedtime.   BLOOD GLUCOSE TEST STRIPS Strp Test once a day. Pt uses accu-chek guide dx.E11.9   carvedilol 25 MG tablet Commonly known as:  COREG Take 25 mg by mouth 2 (two) times daily with a meal.   DIGOX 0.125 MG tablet Generic drug:  digoxin Take 0.125 mg by mouth daily.   ferrous sulfate 325 (65 FE) MG tablet Take 325 mg by mouth 2 (two) times daily with a meal.   furosemide 40 MG tablet Commonly known as:  LASIX Take 40 mg by mouth 2 (two) times daily.   ketoconazole 2 % cream Commonly known as:  NIZORAL Apply 1 application topically daily.   mirabegron ER 50 MG Tb24 tablet Commonly known as:  MYRBETRIQ Take 1 tablet (50 mg total) by mouth daily.   NON FORMULARY [facility]  Onychomycosis Nail Lacquer -  Fluconazole 2%, Terbinafine 1% DMSO Apply to affected nail once daily Qty. 120 gm 3 refills   OXYGEN Inhale 2 L into the lungs at bedtime.   terazosin 2 MG capsule Commonly known as:  HYTRIN Take 2 mg by mouth at bedtime.   Vitamin D (Ergocalciferol) 50000 units Caps capsule Commonly known as:  DRISDOL Take 50,000 Units by mouth every 7 (seven) days. Weekly for 12 weeks then monthly

## 2018-04-06 NOTE — Patient Instructions (Signed)
High dose flu shot today  Follow up in 1 year

## 2018-04-07 LAB — IRON AND TIBC
Iron Saturation: 30 % (ref 15–55)
Iron: 72 ug/dL (ref 27–139)
Total Iron Binding Capacity: 244 ug/dL — ABNORMAL LOW (ref 250–450)
UIBC: 172 ug/dL (ref 118–369)

## 2018-04-12 LAB — CUP PACEART REMOTE DEVICE CHECK
Brady Statistic AP VS Percent: 73.31 %
Brady Statistic AS VP Percent: 0.05 %
Date Time Interrogation Session: 20190821073424
HIGH POWER IMPEDANCE MEASURED VALUE: 38 Ohm
HighPow Impedance: 50 Ohm
Lead Channel Impedance Value: 380 Ohm
Lead Channel Impedance Value: 437 Ohm
Lead Channel Pacing Threshold Amplitude: 0.625 V
Lead Channel Pacing Threshold Amplitude: 0.625 V
Lead Channel Pacing Threshold Pulse Width: 0.4 ms
Lead Channel Sensing Intrinsic Amplitude: 0.75 mV
Lead Channel Sensing Intrinsic Amplitude: 0.75 mV
Lead Channel Setting Pacing Amplitude: 1.5 V
Lead Channel Setting Pacing Amplitude: 2 V
MDC IDC MSMT BATTERY REMAINING LONGEVITY: 59 mo
MDC IDC MSMT BATTERY VOLTAGE: 2.99 V
MDC IDC MSMT LEADCHNL RA PACING THRESHOLD PULSEWIDTH: 0.4 ms
MDC IDC MSMT LEADCHNL RV IMPEDANCE VALUE: 380 Ohm
MDC IDC MSMT LEADCHNL RV SENSING INTR AMPL: 4.125 mV
MDC IDC MSMT LEADCHNL RV SENSING INTR AMPL: 4.125 mV
MDC IDC PG IMPLANT DT: 20131120
MDC IDC SET LEADCHNL RV PACING PULSEWIDTH: 0.4 ms
MDC IDC SET LEADCHNL RV SENSING SENSITIVITY: 0.3 mV
MDC IDC STAT BRADY AP VP PERCENT: 1.43 %
MDC IDC STAT BRADY AS VS PERCENT: 25.21 %
MDC IDC STAT BRADY RA PERCENT PACED: 73.43 %
MDC IDC STAT BRADY RV PERCENT PACED: 1.74 %

## 2018-04-13 ENCOUNTER — Ambulatory Visit: Payer: Medicare PPO | Admitting: Cardiovascular Disease

## 2018-04-13 ENCOUNTER — Encounter: Payer: Self-pay | Admitting: Cardiovascular Disease

## 2018-04-13 ENCOUNTER — Ambulatory Visit
Admission: RE | Admit: 2018-04-13 | Discharge: 2018-04-13 | Disposition: A | Discharge status: Home / Self Care | Payer: Medicare PPO | Source: Ambulatory Visit | Attending: Cardiovascular Disease | Admitting: Cardiovascular Disease

## 2018-04-13 VITALS — BP 140/72 | HR 51 | Ht 63.5 in | Wt 181.0 lb

## 2018-04-13 DIAGNOSIS — I2510 Atherosclerotic heart disease of native coronary artery without angina pectoris: Secondary | ICD-10-CM | POA: Diagnosis not present

## 2018-04-13 DIAGNOSIS — I2721 Secondary pulmonary arterial hypertension: Secondary | ICD-10-CM

## 2018-04-13 DIAGNOSIS — I5022 Chronic systolic (congestive) heart failure: Secondary | ICD-10-CM | POA: Diagnosis not present

## 2018-04-13 DIAGNOSIS — N184 Chronic kidney disease, stage 4 (severe): Secondary | ICD-10-CM

## 2018-04-13 DIAGNOSIS — I10 Essential (primary) hypertension: Secondary | ICD-10-CM | POA: Diagnosis not present

## 2018-04-13 DIAGNOSIS — E1169 Type 2 diabetes mellitus with other specified complication: Secondary | ICD-10-CM

## 2018-04-13 DIAGNOSIS — E669 Obesity, unspecified: Secondary | ICD-10-CM

## 2018-04-13 DIAGNOSIS — I471 Supraventricular tachycardia: Secondary | ICD-10-CM

## 2018-04-13 DIAGNOSIS — Z95810 Presence of automatic (implantable) cardiac defibrillator: Secondary | ICD-10-CM | POA: Diagnosis not present

## 2018-04-13 DIAGNOSIS — R05 Cough: Secondary | ICD-10-CM | POA: Diagnosis not present

## 2018-04-13 DIAGNOSIS — E7800 Pure hypercholesterolemia, unspecified: Secondary | ICD-10-CM

## 2018-04-13 DIAGNOSIS — R0989 Other specified symptoms and signs involving the circulatory and respiratory systems: Secondary | ICD-10-CM

## 2018-04-13 DIAGNOSIS — G4733 Obstructive sleep apnea (adult) (pediatric): Secondary | ICD-10-CM

## 2018-04-13 MED ORDER — HYDRALAZINE HCL 25 MG PO TABS: 25.0000 mg | ORAL_TABLET | Freq: Two times a day (BID) | ORAL | 3 refills | Status: DC

## 2018-04-13 NOTE — Patient Instructions (Addendum)
Dr [name] has recommended making the following medication changes: 1. STOP Terazosin 2. START Hydralazine 25 mg - take 1 tablet by mouth twice daily 3. WEAN OFF Digoxin - take 1 tablet on Monday, Wednesday, Fridays - okay to STOP completely if you do not have any symptoms of shortness of breath or edema  Your physician has requested that you have a chest x-ray. A chest x-ray takes a picture of the organs and structures inside the chest, including the heart, lungs, and blood vessels. This test can show several things, including, whether the heart is enlarges; whether fluid is building up in the lungs; and whether pacemaker / defibrillator leads are still in place.  Remote monitoring is used to monitor your Pacemaker or ICD from home. This monitoring reduces the number of office visits required to check your device to one time per year. It allows [place] to keep an eye on the functioning of your device to ensure it is working properly. You are scheduled for a device check from home on Wednesday, November 10th, 2019. You may send your transmission at any time that day. If you have a wireless device, the transmission will be sent automatically. After your physician reviews your transmission, you will receive a postcard with your next transmission date.  To improve our patient care and to more adequately follow your device, [facility] has decided, as a practice, to start following each patient four times a year with your home monitor. This means that you may experience a remote appointment that is close to an in-office appointment with your physician. Your insurance will apply at the same rate as other remote monitoring transmissions.  Dr [name] recommends that you schedule a follow-up appointment in 6 months with an ICD check. You will receive a reminder letter in the mail two months in advance. If you don't receive a letter, please call our office to schedule the follow-up appointment.  If you need a  refill on your cardiac medications before your next appointment, please call your pharmacy.

## 2018-04-13 NOTE — Progress Notes (Signed)
Cardiology Office Note:    Date:  04/15/2018   ID:  [name], DOB May 23, 1941, MRN [id number]  PCP:  Girtha Rm, NP-C  Cardiologist:  Sanda Klein, MD   Referring MD: Girtha Rm, NP-C   Chief complaint: CHF, ICD  History of Present Illness:    [name] is a 77 y.o. female with a hx of nonischemic cardiomyopathy (previous EF 35%, most recent EF 45%), minor nonobstructive coronary artery disease (last heart cath December 2015), status post defibrillator ([employer]onic [place] dual-chamber, implanted 2013, Sprint [place] 6949-lead under advisory) severe pulmonary hypertension, obstructive sleep apnea, restrictive lung disease, essential hypertension, hypercholesterolemia type 2 diabetes mellitus, which is currently diet controlled.  She has adjusted well to her relocation from [place].  She likes [place].  Her daughter [name] is a Marine scientist in our office. 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  She has not had any episodes of decompensation.  Her valsartan was stopped due to her kidney problems (sees Dr. [name]).  Although Mrs. Bou says that she "walks a lot in the house" she is really only walking for a few minutes every day.  Her device shows only 0.1 hours of activity daily.  It is harder for her to really assign her functional status.  [place] without dyspnea.  Her heart rate histogram is quite blunted, but this is probably because she is so sedentary. The patient specifically denies any chest pain at rest exertion, dyspnea at rest or with exertion, orthopnea, paroxysmal nocturnal dyspnea, syncope, palpitations, focal neurological deficits, intermittent claudication, worsening lower extremity edema, unexplained weight gain, cough, hemoptysis or wheezing.  She has not had to take extra diuretics  recently.  Comprehensive interrogation of her defibrillator today shows normal function.  Estimated generator longevity is 4.9 years.  The device has never delivered therapies for tachycardia.  She has 81 % atrial pacing and only 1.6% ventricular pacing.  Today there were no detectable P waves, making her "atrially dependent".  Has not had any atrial mode switch since her last appointment.  She had a single 11 beat episode of nonsustained VT.    She does have problems with lower extremity edema but she self manages this by adjusting her dose of diuretic.  If she develops weight gain and ankle swelling she increases her dose of furosemide from 40 mg daily to 80 mg daily.  She only takes the diuretic 6 days a week.  She has to increase the dose of diuretic to the higher dose no more than twice a week on the average.  She is compliant with daily weights and is quite knowledgeable about sodium restriction.  She estimates her "dry weight" on her home scale at about 180 pounds.  Her most recent echocardiogram was performed in October 2016 and showed a left ventricular ejection fraction of 45% with a mildly dilated left ventricle measuring 5.3 cm in end diastole.  Mitral inflow showed a pattern of abnormal relaxation, consistent with the absence of elevated mean left atrial pressure, the E/e' prime ratio at the medial annulus was however elevated at 13.  The same echocardiogram also showed mild mitral insufficiency and mild to moderate tricuspid insufficiency and a mildly dilated left atrium.  The right atrium and right ventricle was described as normal.  There is a mention of mild aortic stenosis  with a valve area calculated at 1.8 cm, but the gradients are quite normal (peak 10, mean 6 mmHg).  The most striking abnormality on the echo is severe pulmonary artery pressure with an estimated systolic pressure of 70 mmHg assuming a right atrial pressure of 7 mmHg.  There are no objective measurements to assess right  ventricular systolic function but qualitatively  the right ventricle is described as normal.    Her last cardiac catheterization was in December 2015 and described "mild plaquing and no obstructive CAD" in the left main and circumflex coronary artery, while the LAD and RCA were described as normal.  At that time the EF was 35-45% by ventriculography in the left ventricular end-diastolic pressure was 30 mmHg.  The mean pulmonary artery pressure was 25 mmHg.  The pulmonary artery pressure was 90/23 mmHg (with systemic blood pressure 168/66).  Carotid duplex ultrasound performed in October 2017 showed minor plaque in both carotids and antegrade flow in both vertebral arteries.  Comprehensive interrogation of her defibrillator today shows normal function.  Estimated generator longevity is 4.9 years.  The device has never delivered therapies for tachycardia.  She has 81 % atrial pacing and only 1.6% ventricular pacing.  Today there were no detectable P waves, making her "atrially dependent".  Has not had any atrial mode switch since her last appointment.  She had a single 11 beat episode of nonsustained VT.    Since January 2018 she has had a grand total of 14 minutes of atrial mode switch.  Review of the electrogram showed these events to primarily be paroxysmal atrial tachycardia.  This is usually conducted with some degree of AV block so that there is no high ventricular rate.  There is one exception where she had 1: 1 AV conduction, which was misinterpreted by her device is representing nonsustained ventricular tachycardia.  Lead parameters are normal.  As mentioned her ventricular lead is a Sprint [employer] [id number] under advisory but shows no evidence of SIC.  Her cardiologist in [place] for many years was Dr. Elberta Spaniel, most recently Dr. Barnie Alderman  She bears a diagnosis of restrictive lung disease and obstructive sleep apnea.  She is supposed to be on home oxygen.  She uses CPAP for severe  obstructive sleep apnea with an apnea hypotony index of 59 and desaturations down to 80%.  Despite treatment with CPAP it has been very difficult to control her AHI.  She plans to follow-up with Dr. Halford Chessman or with Dr. Elsworth Soho in the pulmonary clinic.  The report stated that prior PFTs showed no obstruction but fairly significant restriction and reduced DLCO but repeat PFTs today were improved, CT chest was unremarkable and the feeling was that her initial PFTs may have not been accurate.  Most recent hemoglobin A1c is documented as being 6.1%, on glimepiride monotherapy.  She is not on statin therapy, but her previous notes report that she was taking atorvastatin 40 mg daily and her LDL was 36.[name]  Her baseline creatinine seems to be 1.6-1.9.  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  She had a pulmonary embolism 40 years ago.  EGD performed in 2011 showed mild erosive esophagitis and gastritis.  She was prescribed Protonix.  She has a remote history of depression for which she is not currently receiving any medications.  She has never smoked and denies use of alcohol.    Recent labs are significant for a digoxin level of less than 0.4, BUN 22, creatinine  1.6, glucose 138, cholesterol 127, HDL 83, LDL 36, triglycerides 42, hemoglobin A1c 6.8%, hemoglobin 11.6, platelets 220 3K.  These labs were all performed in October 2018  Past Medical History:  Diagnosis Date  . Anemia   . CAD (coronary artery disease)   . Cardiac defibrillator in situ   . Cataract    bilateral 2017  . CHF (congestive heart failure) ([place])   . Chronic systolic heart failure ([place])   . CKD (chronic kidney disease) stage 4, GFR 15-29 ml/min (HCC)   . COPD (chronic obstructive pulmonary disease) ([place])   . HTN (hypertension)   . Malignant neoplasm of rectum ([place]) 09/08/2017   Dr. Rollene Rotunda in [place]. Per notes she was due for colonoscopy in 12/2016  . OSA on CPAP 09/08/2017  . Other cardiomyopathies ([place])    . Oxygen deficiency    2 liters at night  . Pulmonary HTN ([place])   . Sleep apnea    uses c-pap  . Stroke ([place])   . Type 2 diabetes, controlled, with peripheral neuropathy ([place])   . Urge incontinence of urine     Past Surgical History:  Procedure Laterality Date  . BIOPSY  03/29/2018   Procedure: BIOPSY;  Surgeon: Yetta Flock, MD;  Location: [facility];  Service: Gastroenterology;;  . COLONOSCOPY    . COLONOSCOPY WITH PROPOFOL N/A 03/29/2018   Procedure: COLONOSCOPY WITH PROPOFOL;  Surgeon: Yetta Flock, MD;  Location: [facility];  Service: Gastroenterology;  Laterality: N/A;  . ESOPHAGOGASTRODUODENOSCOPY (EGD) WITH PROPOFOL N/A 03/29/2018   Procedure: ESOPHAGOGASTRODUODENOSCOPY (EGD) WITH PROPOFOL;  Surgeon: Yetta Flock, MD;  Location: [facility];  Service: Gastroenterology;  Laterality: N/A;  . POLYPECTOMY  03/29/2018   Procedure: POLYPECTOMY;  Surgeon: Yetta Flock, MD;  Location: [facility];  Service: Gastroenterology;;  . TONSILLECTOMY AND ADENOIDECTOMY     age 56  . TUBAL LIGATION     years ago  . UPPER GASTROINTESTINAL ENDOSCOPY    . UPPER GI ENDOSCOPY  11/28/2009   mild erosive esophagitis, mild erosive changes in stomach, chronic constipation    Current Medications: Current Meds  Medication Sig  . amLODipine (NORVASC) 10 MG tablet Take 10 mg by mouth daily.  [name] aspirin 81 MG tablet Take 81 mg by mouth at bedtime.   . Blood Glucose Monitoring Suppl (ACCU-CHEK GUIDE) w/Device KIT USE AS DIRECTED  . carvedilol (COREG) 25 MG tablet Take 25 mg by mouth 2 (two) times daily with a meal.  . DIGOX 125 MCG tablet Take 0.125 mg by mouth daily.   . ferrous sulfate 325 (65 FE) MG tablet Take 325 mg by mouth 2 (two) times daily with a meal.  . furosemide (LASIX) 40 MG tablet Take 40 mg by mouth 2 (two) times daily.   . Glucose Blood (BLOOD GLUCOSE TEST STRIPS) STRP Test once a day. Pt uses accu-chek guide dx.E11.9  . mirabegron ER (MYRBETRIQ)  50 MG TB24 tablet Take 1 tablet (50 mg total) by mouth daily. (Patient taking differently: Take 50 mg by mouth 2 (two) times a week. Saturday and Sunday)  . NON FORMULARY [facility]  Onychomycosis Nail Lacquer -  Fluconazole 2%, Terbinafine 1% DMSO Apply to affected nail once daily Qty. 120 gm 3 refills  . OXYGEN Inhale 2 L into the lungs at bedtime.   . Vitamin D, Ergocalciferol, (DRISDOL) 50000 units CAPS capsule Take 50,000 Units by mouth every 7 (seven) days. Weekly for 12 weeks then monthly  . [DISCONTINUED] terazosin (HYTRIN) 2 MG  capsule Take 2 mg by mouth at bedtime.     Allergies:   Lotrel [amlodipine besy-benazepril hcl] and Talwin [pentazocine]   Social History   Socioeconomic History  . Marital status: Widowed    Spouse name: Not on file  . Number of children: Not on file  . Years of education: Not on file  . Highest education level: Not on file  Occupational History  . Not on file  Social Needs  . Financial resource strain: Not on file  . Food insecurity:    Worry: Not on file    Inability: Not on file  . Transportation needs:    Medical: Not on file    Non-medical: Not on file  Tobacco Use  . Smoking status: Former Smoker    Last attempt to quit: 03/14/2009    Years since quitting: 9.0  . Smokeless tobacco: Never Used  . Tobacco comment: quit smoking 2010  Substance and Sexual Activity  . Alcohol use: No    Frequency: Never  . Drug use: No  . Sexual activity: Not on file  Lifestyle  . Physical activity:    Days per week: Not on file    Minutes per session: Not on file  . Stress: Not on file  Relationships  . Social connections:    Talks on phone: Not on file    Gets together: Not on file    Attends religious service: Not on file    Active member of club or organization: Not on file    Attends meetings of clubs or organizations: Not on file    Relationship status: Not on file  Other Topics Concern  . Not on file  Social History Narrative   . Not on file     Family History: The patient's family history is significant for a history of coronary artery disease at advanced ages.  And stroke at advanced ages ROS:   Please see the history of present illness.     All other systems reviewed and are negative.  EKGs/Labs/Other Studies Reviewed:    The following studies were reviewed today: Numerous studies including cardiac cath, echo, pulmonary function test, labs, office notes from cardiology/pulmonology/internal medicine from [facility]  EKG:  EKG is ordered today.  The ekg ordered today demonstrates atrial paced, ventricular sensed rhythm at 50 bpm with a long AV delay (232 ms) and narrow QRS 92 ms.  There are minor ST-T changes in V4-V6 and the QT interval is borderline at 450 ms.  Interpretation is limited by baseline artifact due to tremor.  Recent Labs: 09/01/2017: ALT 10 09/20/2017: BUN 37; Creatinine, Ser 1.77; Potassium 4.7; Sodium 145 04/06/2018: Hemoglobin 10.9; Platelets 235.0  Recent Lipid Panel    Component Value Date/Time   CHOL 143 09/01/2017 1512   TRIG 49 09/01/2017 1512   HDL 92 09/01/2017 1512   CHOLHDL 1.6 09/01/2017 1512   CHOLHDL 2.3 06/25/2007 0700   VLDL 11 06/25/2007 0700   LDLCALC 41 09/01/2017 1512    Physical Exam:    VS:  BP 140/72 (BP Location: Left Arm, Patient Position: Sitting, Cuff Size: Large)   Pulse (!) 51   Ht 5' 3.5" (1.613 m)   Wt 181 lb (82.1 kg)   BMI 31.56 kg/m     Wt Readings from Last 3 Encounters:  04/13/18 181 lb (82.1 kg)  04/06/18 182 lb 6.4 oz (82.7 kg)  03/29/18 182 lb (82.6 kg)     General: Alert, oriented x3, no distress, mildly obese, healthy  left subclavian defibrillator site Head: no evidence of trauma, PERRL, EOMI, no exophtalmos or lid lag, no myxedema, no xanthelasma; normal ears, nose and oropharynx Neck: normal jugular venous pulsations and no hepatojugular reflux; brisk carotid pulses without delay and no carotid bruits Chest: Dry rales/crackles heard  in the left lung base that do not clear with cough, no rales on the right, no signs of consolidation by percussion or palpation, normal fremitus, symmetrical and full respiratory excursions Cardiovascular: normal position and quality of the apical impulse, regular rhythm, normal first and loud second heart sounds, no murmurs, rubs or gallops Abdomen: no tenderness or distention, no masses by palpation, no abnormal pulsatility or arterial bruits, normal bowel sounds, no hepatosplenomegaly Extremities: no clubbing, cyanosis or edema; 2+ radial, ulnar and brachial pulses bilaterally; 2+ right femoral, posterior tibial and dorsalis pedis pulses; 2+ left femoral, posterior tibial and dorsalis pedis pulses; no subclavian or femoral bruits Neurological: grossly nonfocal Psych: Normal mood and affect   ASSESSMENT:    1. Chronic systolic heart failure ([place])   2. PAH (pulmonary artery hypertension) ([place])   3. ICD (implantable cardioverter-defibrillator) in place   4. PAT (paroxysmal atrial tachycardia) ([place])   5. Essential hypertension   6. CKD (chronic kidney disease) stage 4, GFR 15-29 ml/min (HCC)   7. Diabetes mellitus type 2 in obese ([place])   8. Hypercholesterolemia   9. Coronary artery disease involving native coronary artery of native heart without angina pectoris   10. OSA (obstructive sleep apnea)   11. Respiratory crackles at left lung base    PLAN:    In order of problems listed above:  1. CHF: By her most recent echo she has relatively mild reduction in left ventricular systolic function.  Clinically euvolemic.  Hard to assess functional status since she is very sedentary, but appears to be asymptomatic.  Very close to her self-reported "dry weight" of 180 pounds.  She is very conscientious with sodium restriction and weight monitoring. 2. PAH: She has very severe pulmonary artery hypertension.  Suspect if she was more active this would be a major limitation.  At the time of a cardiac  catheterization, although her pulmonary artery wedge pressure was moderately increased, she had a very high transpulmonary gradient suggesting that the major cause of the pulmonary hypertension is fixed arteriolar disease.  Although her pulmonary function tests appeared to be very abnormal when initially evaluated, follow-up PFTs were only mildly abnormal.  It appears that the dominant reason for her severe pulmonary hypertension might be insufficiently treated obstructive sleep apnea. 3. ICD: Normal defibrillator function. She does not have ventricular pacing.   She has never received tachycardia therapies from her defibrillator.  We will transfer her follow-up to our device clinic with remote downloads every 3 months and at least yearly office visits.  She does have a defibrillator lead that is under advisory, but there is no evidence of lead dysfunction and she is not pacemaker dependent.  No plan for lead revision. 4. PAT: None recorded recently.  5. HTN: Fair control, on appropriate treatment with carvedilol, angiotensin receptor blocker, spironolactone for heart failure.  Stop Terazosin because of potential adverse effects and heart failure.  The valsartan was stopped due to kidney dysfunction.  We will start hydralazine, low-dose, titrate for target systolic blood pressure less than 130. 6. CKD 4: As kidney function continues to deteriorate I think the digoxin is more of a liability than benefit.  We will wean her off it.  I have asked  her to take it only every other day for a month and if there is no clinical deterioration we will stop it altogether. 7. DM: Excellent glycemic control on a very low amount of antidiabetic medications.  It is quite possible she could "cure" her diabetes with weight loss. 8. HLP: Excellent LDL cholesterol earlier this year. 9. CAD: Minor plaque at her last cardiac catheterization and minor carotid plaque by ultrasound.  On low-dose aspirin. 10. OSA: Probably recommend  follow-up with Dr. Claiborne Billings for her sleep apnea. This remains the most likely reason for her pulmonary hypertension although she also has an uncertain amount of interstitial lung disease 11. Abnormal lung exam: She had some loud crackles in her left lung base so we ordered a chest x-ray, but no distinct lung pathology seen.  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  She does not have cough or pleuritic chest pain.  No further work-up planned at this point.   Medication Adjustments/Labs and Tests Ordered: Current medicines are reviewed at length with the patient today.  Concerns regarding medicines are outlined above.  Orders Placed This Encounter  Procedures  . DG Chest 2 View  . EKG 12-Lead   Meds ordered this encounter  Medications  . hydrALAZINE (APRESOLINE) 25 MG tablet    Sig: Take 1 tablet (25 mg total) by mouth 2 (two) times daily.    Dispense:  180 tablet    Refill:  3    Signed, Sanda Klein, MD  04/15/2018 3:22 PM    [place]

## 2018-04-15 ENCOUNTER — Encounter: Payer: Self-pay | Admitting: Cardiovascular Disease

## 2018-04-18 ENCOUNTER — Telehealth: Payer: Self-pay

## 2018-04-18 MED ORDER — ATORVASTATIN CALCIUM 40 MG PO TABS: 40.0000 mg | ORAL_TABLET | Freq: Every day | ORAL | 3 refills | Status: DC

## 2018-04-18 NOTE — Telephone Encounter (Signed)
Rx(s) sent to pharmacy electronically.

## 2018-06-01 NOTE — Progress Notes (Signed)
Subjective:    Patient ID: LAJEANA STROUGH, female    DOB: [birth year], 77 y.o.   MRN: [id number]  JOLLY CARLINI is a 77 y.o. female who presents for follow-up of Type 2 diabetes mellitus. No concerns today. Reports feeling well.   States she has an appointment with her nephrologist next week.  Daughter requests a prescription for a wheelchair due to inability to walk long distances.   Requests refill on Myrbetriq. Doing well on this.   Patient is checking home blood sugars.   Home blood sugar records: BGs range between 87 and 113 How often is blood sugars being checked: twice a week Current symptoms include: none. Patient denies increased appetite, nausea, visual disturbances, vomiting and weight loss.  Patient is checking their feet daily. Seeing her podiatrist soon and goes every 3 months.  Any Foot concerns (callous, ulcer, wound, thickened nails, toenail fungus, skin fungus, hammer toe): none Last dilated eye exam: 1 year ago- 2018  Current treatments:doing well with diet control Medication compliance: good. No diabetes medications.  Current diet: in general, a "healthy" diet   Current exercise: walking Known diabetic complications: CAD  The following portions of the patient's history were reviewed and updated as appropriate: allergies, current medications, past medical history, past social history and problem list.  ROS as in subjective above.     Objective:    Physical Exam Alert and in no distress.  Pharyngeal area is normal. Cardiac exam shows a regular sinus rhythm without murmurs or gallops. Lungs are clear to auscultation. Normal work of breathing. Extremities without edema. Skin is warm and dry. Normal speech, mood and thought process.    Blood pressure 120/68, pulse 62, weight 187 lb 6.4 oz (85 kg).  Lab Review Diabetic Labs Latest Ref Rng & Units 09/20/2017 09/16/2017 09/13/2017 09/01/2017 05/07/2017  HbA1c 4.8 - 5.6 % - - - 5.7(H) 6.8  Chol 100 - 199 mg/dL - - -  143 -  HDL >39 mg/dL - - - 92 -  Calc LDL 0 - 99 mg/dL - - - 41 -  Triglycerides 0 - 149 mg/dL - - - 49 -  Creatinine 0.57 - 1.00 mg/dL 1.77(H) 2.05(H) 2.08(H) 1.90(H) -   BP/Weight 06/02/2018 04/13/2018 04/06/2018 03/29/2018 11/17/1565  Systolic BP 164 089 097 529 -  Diastolic BP 68 72 60 61 -  Wt. (Lbs) 187.4 181 182.4 182 182  BMI 32.68 31.56 33.91 31.24 31.24   Foot/eye exam completion dates 01/27/2018  Foot Form Completion Done    [place]  reports that she quit smoking about 9 years ago. She has never used smokeless tobacco. She reports that she does not drink alcohol or use drugs.     Assessment & Plan:    Type 2 diabetes, controlled, with peripheral neuropathy (HCC)  Chronic systolic heart failure (HCC)  Essential hypertension  Chronic kidney disease, unspecified CKD stage  Urge incontinence of urine  1. Rx changes: none HGb A1c 6.5% and no diabetes medications.  2. Education: Reviewed 'ABCs' of diabetes management (respective goals in parentheses):  A1C (<7), blood pressure (<130/80), and cholesterol (LDL <100). 3. Compliance at present is estimated to be good. Efforts to improve compliance (if necessary) will be directed at continuing to control diabetes with diet . 4. HTN- BP is well controlled. No changes needed.  5. Refilled Myrbetriq, doing well with this 2 days per week.  6. She will continue seeing her specialists for chronic health conditions including next week with her nephrologist.  7. No labs today. Prescription was given for a wheelchair and rolling walker.  8. Follow up: 6 months

## 2018-06-02 ENCOUNTER — Ambulatory Visit: Payer: Medicare PPO | Admitting: Family Medicine

## 2018-06-02 ENCOUNTER — Encounter: Payer: Self-pay | Admitting: Family Medicine

## 2018-06-02 VITALS — BP 120/68 | HR 62 | Wt 187.4 lb

## 2018-06-02 DIAGNOSIS — I10 Essential (primary) hypertension: Secondary | ICD-10-CM | POA: Diagnosis not present

## 2018-06-02 DIAGNOSIS — N189 Chronic kidney disease, unspecified: Secondary | ICD-10-CM

## 2018-06-02 DIAGNOSIS — E1142 Type 2 diabetes mellitus with diabetic polyneuropathy: Secondary | ICD-10-CM | POA: Diagnosis not present

## 2018-06-02 DIAGNOSIS — N3941 Urge incontinence: Secondary | ICD-10-CM

## 2018-06-02 DIAGNOSIS — I5022 Chronic systolic (congestive) heart failure: Secondary | ICD-10-CM

## 2018-06-02 DIAGNOSIS — N184 Chronic kidney disease, stage 4 (severe): Secondary | ICD-10-CM | POA: Diagnosis not present

## 2018-06-02 LAB — POCT GLYCOSYLATED HEMOGLOBIN (HGB A1C): Hemoglobin A1C: 6.5 % — AB (ref 4.0–5.6)

## 2018-06-02 MED ORDER — MIRABEGRON ER 50 MG PO TB24: 50.0000 mg | ORAL_TABLET | ORAL | 2 refills | Status: DC

## 2018-06-02 NOTE — Patient Instructions (Addendum)
It was a pleasure seeing you today. Your hemoglobin A1c is 6.5% and in good range. Continue eating a healthy diet to control this.   Your blood pressure is 120/68. Continue on current medications.   I will see you back in 6 months or sooner if needed.

## 2018-06-07 ENCOUNTER — Other Ambulatory Visit: Payer: Self-pay | Admitting: Family Medicine

## 2018-06-07 DIAGNOSIS — N2581 Secondary hyperparathyroidism of renal origin: Secondary | ICD-10-CM | POA: Diagnosis not present

## 2018-06-07 DIAGNOSIS — D631 Anemia in chronic kidney disease: Secondary | ICD-10-CM | POA: Diagnosis not present

## 2018-06-07 DIAGNOSIS — N184 Chronic kidney disease, stage 4 (severe): Secondary | ICD-10-CM | POA: Diagnosis not present

## 2018-06-07 DIAGNOSIS — I129 Hypertensive chronic kidney disease with stage 1 through stage 4 chronic kidney disease, or unspecified chronic kidney disease: Secondary | ICD-10-CM | POA: Diagnosis not present

## 2018-06-08 ENCOUNTER — Ambulatory Visit (INDEPENDENT_AMBULATORY_CARE_PROVIDER_SITE_OTHER): Payer: Medicare PPO

## 2018-06-08 DIAGNOSIS — I428 Other cardiomyopathies: Secondary | ICD-10-CM

## 2018-06-09 NOTE — Progress Notes (Signed)
Remote ICD transmission.

## 2018-06-10 ENCOUNTER — Ambulatory Visit: Payer: Medicare PPO | Admitting: Podiatry

## 2018-06-10 DIAGNOSIS — M79675 Pain in left toe(s): Secondary | ICD-10-CM | POA: Diagnosis not present

## 2018-06-10 DIAGNOSIS — L84 Corns and callosities: Secondary | ICD-10-CM | POA: Diagnosis not present

## 2018-06-10 DIAGNOSIS — E1142 Type 2 diabetes mellitus with diabetic polyneuropathy: Secondary | ICD-10-CM

## 2018-06-10 DIAGNOSIS — M79674 Pain in right toe(s): Secondary | ICD-10-CM | POA: Diagnosis not present

## 2018-06-10 DIAGNOSIS — B351 Tinea unguium: Secondary | ICD-10-CM | POA: Diagnosis not present

## 2018-06-13 ENCOUNTER — Encounter: Payer: Self-pay | Admitting: Family Medicine

## 2018-06-30 ENCOUNTER — Telehealth: Payer: Self-pay | Admitting: Internal Medicine

## 2018-06-30 MED ORDER — MIRABEGRON ER 50 MG PO TB24: 50.0000 mg | ORAL_TABLET | Freq: Every day | ORAL | 2 refills | Status: DC

## 2018-06-30 NOTE — Telephone Encounter (Signed)
Pt called and states pharmacy only gave her 8 tablets for the month. She only takes it when she goes out. And that's usually Friday Saturday and Sunday.   Spoke to vickie and she states to prescribe it to take it everyday and then when she goes out she can take it. Pt was advised. I have sent in med

## 2018-07-01 ENCOUNTER — Encounter: Payer: Self-pay | Admitting: Podiatry

## 2018-07-01 NOTE — Progress Notes (Signed)
Subjective: PELAGIA [name] presents today with diabetes, diabetic neuropathy and cc of painful, discolored, thick toenails which presents today with diabetes, diabetic neuropathy which interfere with daily activities and routine tasks.  Pain is aggravated when wearing enclosed shoe gear. Pain is getting progressively worse and relieved with periodic professional debridement. Patient also presents with cc of calluses b/l feet.   Current Outpatient Medications:  .  amLODipine (NORVASC) 10 MG tablet, Take 10 mg by mouth daily., Disp: , Rfl:  .  aspirin 81 MG tablet, Take 81 mg by mouth at bedtime. , Disp: , Rfl:  .  atorvastatin (LIPITOR) 40 MG tablet, Take 1 tablet (40 mg total) by mouth daily., Disp: 90 tablet, Rfl: 3 .  Blood Glucose Monitoring Suppl (ACCU-CHEK GUIDE) w/Device KIT, USE AS DIRECTED, Disp: 1 kit, Rfl: 0 .  carvedilol (COREG) 25 MG tablet, Take 25 mg by mouth 2 (two) times daily with a meal., Disp: , Rfl: 0 .  ferrous sulfate 325 (65 FE) MG tablet, Take 325 mg by mouth 2 (two) times daily with a meal., Disp: , Rfl:  .  furosemide (LASIX) 40 MG tablet, Take 40 mg by mouth 2 (two) times daily. , Disp: , Rfl: 1 .  Glucose Blood (BLOOD GLUCOSE TEST STRIPS) STRP, Test once a day. 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  hydrALAZINE (APRESOLINE) 25 MG tablet, Take 1 tablet (25 mg total) by mouth 2 (two) times daily., Disp: 180 tablet, Rfl: 3 .  NON FORMULARY, [facility]  Onychomycosis Nail Lacquer -  Fluconazole 2%, Terbinafine 1% DMSO Apply to affected nail once daily Qty. 120 gm 3 refills, Disp: , Rfl:  .  OXYGEN, Inhale 2 L into the lungs at bedtime. , Disp: , Rfl:  .  Vitamin D, Ergocalciferol, (DRISDOL) 50000 units CAPS capsule, Take 50,000 Units by mouth every 30 (thirty) days. Weekly for 12 weeks then monthly , Disp: , Rfl:  .  mirabegron ER (MYRBETRIQ) 50 MG TB24 tablet, Take 1 tablet (50 mg total) by mouth daily., Disp: 30 tablet, Rfl: 2  Allergies   Allergen Reactions  . Lotrel [Amlodipine Besy-Benazepril Hcl]   . Talwin [Pentazocine]     Hallulate    Objective:  Vascular Examination: Capillary refill time <3 seconds x 10 digits Dorsalis pedis pulses palpable b/l Posterior tibial pulses absent b/l No digital hair x 10 digits Skin temperature WNL  b/l  Dermatological Examination: Skin with normal turgor, texture and tone b/l   Toenails 1-5 b/l discolored, thick, dystrophic with subungual debris and pain with palpation to nailbeds due to thickness of nails.  Hyperkeratotic lesion noted submetatarsal head5 b/l, medial 1st metatarsal head right foot  Musculoskeletal: Muscle strength 5/5 to all LE muscle groups  Neurological: Sensation diminished with 10 gram monofilament. Vibratory sensation diminished  Assessment: 1. Painful onychomycosis toenails 1-5 b/l 2. Calluses submetatarsal head5 b/l, medial 1st metatarsal head right foot 3. NIDDM with Diabetic neuropathy  Plan: 1. Continue diabetic foot care principles.  2. Toenails 1-5 b/l were debrided in length and girth without iatrogenic bleeding. 3. Hyperkeratotic lesions pared submetatarsal head5 b/l, medial 1st metatarsal head right foot with sterile chisel blade and gently smoothed with burr. 4. Patient to continue soft, supportive shoe gear 5. Patient to report any pedal injuries to medical professional  6. Follow up 3 months. Patient/POA to call should there be a concern in the interim.

## 2018-08-03 LAB — CUP PACEART REMOTE DEVICE CHECK
Brady Statistic AP VP Percent: 0.06 %
Brady Statistic AP VS Percent: 87.09 %
Brady Statistic AS VS Percent: 12.84 %
Date Time Interrogation Session: 20191120093724
HighPow Impedance: 37 Ohm
HighPow Impedance: 46 Ohm
Lead Channel Impedance Value: 342 Ohm
Lead Channel Pacing Threshold Amplitude: 0.75 V
Lead Channel Pacing Threshold Pulse Width: 0.4 ms
Lead Channel Pacing Threshold Pulse Width: 0.4 ms
Lead Channel Sensing Intrinsic Amplitude: 0.625 mV
Lead Channel Sensing Intrinsic Amplitude: 0.625 mV
Lead Channel Sensing Intrinsic Amplitude: 5 mV
Lead Channel Setting Pacing Pulse Width: 0.4 ms
MDC IDC MSMT BATTERY REMAINING LONGEVITY: 53 mo
MDC IDC MSMT BATTERY VOLTAGE: 2.98 V
MDC IDC MSMT LEADCHNL RV IMPEDANCE VALUE: 323 Ohm
MDC IDC MSMT LEADCHNL RV IMPEDANCE VALUE: 437 Ohm
MDC IDC MSMT LEADCHNL RV PACING THRESHOLD AMPLITUDE: 0.625 V
MDC IDC MSMT LEADCHNL RV SENSING INTR AMPL: 5 mV
MDC IDC PG IMPLANT DT: 20131120
MDC IDC SET LEADCHNL RA PACING AMPLITUDE: 1.5 V
MDC IDC SET LEADCHNL RV PACING AMPLITUDE: 2 V
MDC IDC SET LEADCHNL RV SENSING SENSITIVITY: 0.3 mV
MDC IDC STAT BRADY AS VP PERCENT: 0 %
MDC IDC STAT BRADY RA PERCENT PACED: 86.98 %
MDC IDC STAT BRADY RV PERCENT PACED: 0.08 %

## 2018-08-23 ENCOUNTER — Encounter: Payer: Self-pay | Admitting: Gastroenterology

## 2018-08-23 LAB — CUP PACEART INCLINIC DEVICE CHECK
Date Time Interrogation Session: 20200204163034
MDC IDC PG IMPLANT DT: 20131120

## 2018-08-25 ENCOUNTER — Ambulatory Visit (INDEPENDENT_AMBULATORY_CARE_PROVIDER_SITE_OTHER): Payer: Medicare PPO | Admitting: Family Medicine

## 2018-08-25 ENCOUNTER — Encounter: Payer: Self-pay | Admitting: Family Medicine

## 2018-08-25 VITALS — BP 114/70 | HR 56 | Temp 97.5°F | Ht 63.5 in | Wt 180.8 lb

## 2018-08-25 DIAGNOSIS — D509 Iron deficiency anemia, unspecified: Secondary | ICD-10-CM

## 2018-08-25 DIAGNOSIS — R5383 Other fatigue: Secondary | ICD-10-CM

## 2018-08-25 DIAGNOSIS — R109 Unspecified abdominal pain: Secondary | ICD-10-CM

## 2018-08-25 DIAGNOSIS — K5909 Other constipation: Secondary | ICD-10-CM | POA: Diagnosis not present

## 2018-08-25 DIAGNOSIS — N189 Chronic kidney disease, unspecified: Secondary | ICD-10-CM | POA: Diagnosis not present

## 2018-08-25 DIAGNOSIS — I5022 Chronic systolic (congestive) heart failure: Secondary | ICD-10-CM

## 2018-08-25 LAB — POCT URINALYSIS DIP (PROADVANTAGE DEVICE)
Bilirubin, UA: NEGATIVE
Blood, UA: NEGATIVE
Glucose, UA: NEGATIVE mg/dL
Ketones, POC UA: NEGATIVE mg/dL
Leukocytes, UA: NEGATIVE
Nitrite, UA: NEGATIVE
Protein Ur, POC: 30 mg/dL — AB
Specific Gravity, Urine: 1.015
Urobilinogen, Ur: NEGATIVE
pH, UA: 7 (ref 5.0–8.0)

## 2018-08-25 NOTE — Progress Notes (Signed)
Chief Complaint  Patient presents with  . Flank Pain    constant dull pain in her right side x 1 week that has worsened over the last day or so.    She has had some R lower abdominal and side pain for about 6 weeks off/on. Hadn't been bothering her too much lately (daughter hadn't been aware), but got acutely worse when she turned over in bed this morning at about 3am.  She went to the bathroom, and went back to sleep, and when she woke up later, it still hurt.  Pain remains about the same.  7/10 currently (down from 10 when she woke up this morning). It is at the right lower abdomen/side.  She has h/o chronic constipation.  Took laxative Monday, last stool was Tuesday, loose from the laxative.  Needs laxatives about every 2 weeks. She had gone 8 days without a stool prior to taking the laxative on Monday.   Denies urinary symptoms (just chronic frequency)  Colonoscopy and EGD 03/29/18, revealing chronic gastritis and tubular adenoma.  This was done for evaluation of iron deficiency anemia. She was told by her kidney doctor that iron was improving (last checked in November).  Daughter notes she hasn't been herself the last week, breathing heavier. Requesting labs.  Report weight has been down, no swelling.  H/o CHF.  In process of having dental extractions  PMH, PSH, SH reviewed  Outpatient Encounter Medications as of 08/25/2018  Medication Sig Note  . amLODipine (NORVASC) 10 MG tablet Take 10 mg by mouth daily.   [name] aspirin 81 MG tablet Take 81 mg by mouth at bedtime.    [name] atorvastatin (LIPITOR) 40 MG tablet Take 1 tablet (40 mg total) by mouth daily.   . Blood Glucose Monitoring Suppl (ACCU-CHEK GUIDE) w/Device KIT USE AS DIRECTED   . carvedilol (COREG) 25 MG tablet Take 25 mg by mouth 2 (two) times daily with a meal.   . ferrous sulfate 325 (65 FE) MG tablet Take 325 mg by mouth 2 (two) times daily with a meal.   . furosemide (LASIX) 40 MG tablet Take 40 mg by mouth 2 (two) times daily.     . Glucose Blood (BLOOD GLUCOSE TEST STRIPS) STRP Test once a day. Pt uses accu-chek guide dx.E11.9   . hydrALAZINE (APRESOLINE) 25 MG tablet Take 1 tablet (25 mg total) by mouth 2 (two) times daily.   Salley Scarlet [facility]  Onychomycosis Nail Lacquer -  Fluconazole 2%, Terbinafine 1% DMSO Apply to affected nail once daily Qty. 120 gm 3 refills   . OXYGEN Inhale 2 L into the lungs at bedtime.    . Vitamin D, Ergocalciferol, (DRISDOL) 50000 units CAPS capsule Take 50,000 Units by mouth every 30 (thirty) days. Weekly for 12 weeks then monthly    . acetaminophen-codeine (TYLENOL #3) 300-30 MG tablet TK 1 T PO Q 4 H PRN FOR PAIN 08/25/2018: Using for teeth extractions (only taken 2 tablets so far)  . mirabegron ER (MYRBETRIQ) 50 MG TB24 tablet Take 1 tablet (50 mg total) by mouth daily. (Patient not taking: Reported on 08/25/2018) 08/25/2018: Uses it prn, mainly on weekends   No facility-administered encounter medications on file as of 08/25/2018.    Dulcolax prn  Allergies  Allergen Reactions  . Lotrel [Amlodipine Besy-Benazepril Hcl]   . Talwin [Pentazocine]     Hallulate   ROS:  No fever, chills, nausea, vomiting. Loose stool after diuretic, chronic constipation. Denies epigastric pain, hematochezia or melena.  Denies dysuria, just frequency. No hematuria or h/o kidney stones. No URI symptoms, cough, chest pain, palpitations.  Breathing a little heavier than normal. No bleeding, bruising, rash.   PHYSICAL EXAM:  BP 114/70   Pulse (!) 56   Temp (!) 97.5 F (36.4 C) (Tympanic)   Ht 5' 3.5" (1.613 m)   Wt 180 lb 12.8 oz (82 kg)   BMI 31.52 kg/m   Wt Readings from Last 3 Encounters:  08/25/18 180 lb 12.8 oz (82 kg)  06/02/18 187 lb 6.4 oz (85 kg)  04/13/18 181 lb (82.1 kg)   Well-appearing, pleasant, elderly female, in no acute distress.  Seems to intermittently be breathing a little harder (from abdomen), but speaking easily in full sentences.  Was unable to comfortably lay  flat for exam.  HEENT: conjunctiva and sclera are clear, EOMI. OP clear, moist mucus membranes Neck: no lymphadenopathy or mass Heart: regular rhythm with frequent ectopy. 3/6 SEM at RUSB Lungs clear, no wheezes, rales, ronchi Abdomen--soft, no organomegaly or mass.  No epigastric tenderness. Tender laterally only (extending to ASIS), soft, no rebound or guarding.  Back: no CVA tenderness Extremities: no edema Skin: normal turgor, no rash Neuro: alert and oriented, cranial nerves intact  Urine dip: trace protein, otherwise normal   ASSESSMENT/PLAN:   Abdominal pain, unspecified abdominal location - suspect MSK component, but cannot r/o stool burden given chronic constipation. - Plan: CBC with Differential/Platelet, POCT Urinalysis DIP (Proadvantage Device)  Fatigue, unspecified type - recently a little worse; recheck labs - Plan: CBC with Differential/Platelet, Comprehensive metabolic panel  Chronic kidney disease, unspecified CKD stage - Plan: Comprehensive metabolic panel  Iron deficiency anemia, unspecified iron deficiency anemia type - Plan: CBC with Differential/Platelet, Ferritin, Iron  Chronic systolic heart failure (HCC) - no e/o fluid overload on exam today  Chronic constipation - reviewed colonoscopy and prior GI notes--resume regular miralax dose rather than stimulant laxatives. Iron contributing   CBC, ferritin, iron, c-met   Drink plenty of water (up to your restricted amounts). Consider taking stool softener (docusate) daily, given that you take daily iron, wich is constipating. Consider switching from dulcolax to more frequent use of Miralax (safer to use more regularly) to help control your bowels--titrate the dose to allow for regular bowel movement (take less frequently if stools are loose or frequent). --Go back to taking it how Dr. Armbruster rec ommended (titrate until having bowel movements every other day).   

## 2018-08-25 NOTE — Patient Instructions (Signed)
  Drink plenty of water (up to your restricted amounts). Consider taking stool softener (docusate) daily, given that you take daily iron, wich is constipating. Consider switching from dulcolax to more frequent use of Miralax (safer to use more regularly) to help control your bowels--titrate the dose to allow for regular bowel movement (take less frequently if stools are loose or frequent). --Go back to taking it how Dr. Havery Moros rec ommended (titrate until having bowel movements every other day).  Try heating pad to the area of discomfort, as well as taking tylenol to help relieve the pain. If you develop fever, nausea, vomiting, change in bowels, you will need to be re-evaluated.

## 2018-08-26 LAB — CBC WITH DIFFERENTIAL/PLATELET
BASOS: 0 %
Basophils Absolute: 0 10*3/uL (ref 0.0–0.2)
EOS (ABSOLUTE): 0 10*3/uL (ref 0.0–0.4)
Eos: 1 %
Hematocrit: 41.3 % (ref 34.0–46.6)
Hemoglobin: 13 g/dL (ref 11.1–15.9)
IMMATURE GRANS (ABS): 0 10*3/uL (ref 0.0–0.1)
Immature Granulocytes: 0 %
LYMPHS: 12 %
Lymphocytes Absolute: 0.6 10*3/uL — ABNORMAL LOW (ref 0.7–3.1)
MCH: 26.2 pg — ABNORMAL LOW (ref 26.6–33.0)
MCHC: 31.5 g/dL (ref 31.5–35.7)
MCV: 83 fL (ref 79–97)
MONOS ABS: 0.2 10*3/uL (ref 0.1–0.9)
Monocytes: 4 %
NEUTROS ABS: 4.2 10*3/uL (ref 1.4–7.0)
Neutrophils: 83 %
Platelets: 258 10*3/uL (ref 150–450)
RBC: 4.96 x10E6/uL (ref 3.77–5.28)
RDW: 14.8 % (ref 11.7–15.4)
WBC: 5.1 10*3/uL (ref 3.4–10.8)

## 2018-08-26 LAB — COMPREHENSIVE METABOLIC PANEL
ALBUMIN: 4.7 g/dL (ref 3.7–4.7)
ALT: 11 IU/L (ref 0–32)
AST: 18 IU/L (ref 0–40)
Albumin/Globulin Ratio: 1.7 (ref 1.2–2.2)
Alkaline Phosphatase: 107 IU/L (ref 39–117)
BUN / CREAT RATIO: 11 — AB (ref 12–28)
BUN: 15 mg/dL (ref 8–27)
Bilirubin Total: 0.8 mg/dL (ref 0.0–1.2)
CALCIUM: 9.6 mg/dL (ref 8.7–10.3)
CO2: 21 mmol/L (ref 20–29)
CREATININE: 1.41 mg/dL — AB (ref 0.57–1.00)
Chloride: 105 mmol/L (ref 96–106)
GFR calc Af Amer: 41 mL/min/{1.73_m2} — ABNORMAL LOW (ref >59)
GFR, EST NON AFRICAN AMERICAN: 35 mL/min/{1.73_m2} — AB (ref >59)
GLOBULIN, TOTAL: 2.7 g/dL (ref 1.5–4.5)
Glucose: 175 mg/dL — ABNORMAL HIGH (ref 65–99)
Potassium: 4.2 mmol/L (ref 3.5–5.2)
SODIUM: 145 mmol/L — AB (ref 134–144)
TOTAL PROTEIN: 7.4 g/dL (ref 6.0–8.5)

## 2018-08-26 LAB — IRON: Iron: 26 ug/dL — ABNORMAL LOW (ref 27–139)

## 2018-08-26 LAB — FERRITIN: Ferritin: 255 ng/mL — ABNORMAL HIGH (ref 15–150)

## 2018-09-07 ENCOUNTER — Ambulatory Visit (INDEPENDENT_AMBULATORY_CARE_PROVIDER_SITE_OTHER): Payer: Medicare PPO

## 2018-09-07 DIAGNOSIS — I428 Other cardiomyopathies: Secondary | ICD-10-CM

## 2018-09-07 DIAGNOSIS — I5022 Chronic systolic (congestive) heart failure: Secondary | ICD-10-CM

## 2018-09-09 LAB — CUP PACEART REMOTE DEVICE CHECK
Battery Remaining Longevity: 49 mo
Brady Statistic AP VP Percent: 0.34 %
Brady Statistic AP VS Percent: 80.7 %
Brady Statistic AS VP Percent: 0.02 %
Brady Statistic AS VS Percent: 18.94 %
Brady Statistic RA Percent Paced: 80.36 %
Brady Statistic RV Percent Paced: 0.43 %
Date Time Interrogation Session: 20200219051705
HighPow Impedance: 44 Ohm
HighPow Impedance: 59 Ohm
Lead Channel Impedance Value: 399 Ohm
Lead Channel Pacing Threshold Amplitude: 0.625 V
Lead Channel Pacing Threshold Amplitude: 0.625 V
Lead Channel Pacing Threshold Pulse Width: 0.4 ms
Lead Channel Pacing Threshold Pulse Width: 0.4 ms
Lead Channel Sensing Intrinsic Amplitude: 0.875 mV
Lead Channel Sensing Intrinsic Amplitude: 0.875 mV
Lead Channel Sensing Intrinsic Amplitude: 4.125 mV
Lead Channel Setting Pacing Amplitude: 1.5 V
Lead Channel Setting Pacing Amplitude: 2 V
Lead Channel Setting Pacing Pulse Width: 0.4 ms
Lead Channel Setting Sensing Sensitivity: 0.3 mV
MDC IDC MSMT BATTERY VOLTAGE: 2.97 V
MDC IDC MSMT LEADCHNL RV IMPEDANCE VALUE: 342 Ohm
MDC IDC MSMT LEADCHNL RV IMPEDANCE VALUE: 456 Ohm
MDC IDC MSMT LEADCHNL RV SENSING INTR AMPL: 4.125 mV
MDC IDC PG IMPLANT DT: 20131120

## 2018-09-15 NOTE — Progress Notes (Signed)
Remote ICD transmission.   

## 2018-09-16 ENCOUNTER — Other Ambulatory Visit: Payer: Self-pay

## 2018-09-16 ENCOUNTER — Encounter: Payer: Self-pay | Admitting: Podiatry

## 2018-09-16 ENCOUNTER — Encounter: Payer: Self-pay | Admitting: Cardiology

## 2018-09-16 ENCOUNTER — Ambulatory Visit: Payer: Medicare PPO | Admitting: Podiatry

## 2018-09-16 DIAGNOSIS — L602 Onychogryphosis: Secondary | ICD-10-CM | POA: Insufficient documentation

## 2018-09-16 DIAGNOSIS — B351 Tinea unguium: Secondary | ICD-10-CM | POA: Diagnosis not present

## 2018-09-16 DIAGNOSIS — I739 Peripheral vascular disease, unspecified: Secondary | ICD-10-CM | POA: Insufficient documentation

## 2018-09-16 DIAGNOSIS — M79675 Pain in left toe(s): Secondary | ICD-10-CM | POA: Diagnosis not present

## 2018-09-16 DIAGNOSIS — E1142 Type 2 diabetes mellitus with diabetic polyneuropathy: Secondary | ICD-10-CM | POA: Diagnosis not present

## 2018-09-16 DIAGNOSIS — Z9989 Dependence on other enabling machines and devices: Secondary | ICD-10-CM

## 2018-09-16 DIAGNOSIS — M79674 Pain in right toe(s): Secondary | ICD-10-CM

## 2018-09-16 DIAGNOSIS — L84 Corns and callosities: Secondary | ICD-10-CM | POA: Insufficient documentation

## 2018-09-16 DIAGNOSIS — Z0000 Encounter for general adult medical examination without abnormal findings: Secondary | ICD-10-CM | POA: Insufficient documentation

## 2018-09-16 DIAGNOSIS — R42 Dizziness and giddiness: Secondary | ICD-10-CM | POA: Insufficient documentation

## 2018-09-16 DIAGNOSIS — R0600 Dyspnea, unspecified: Secondary | ICD-10-CM | POA: Insufficient documentation

## 2018-09-16 DIAGNOSIS — I2729 Other secondary pulmonary hypertension: Secondary | ICD-10-CM | POA: Insufficient documentation

## 2018-09-16 DIAGNOSIS — R9430 Abnormal result of cardiovascular function study, unspecified: Secondary | ICD-10-CM | POA: Insufficient documentation

## 2018-09-16 HISTORY — DX: Dependence on other enabling machines and devices: Z99.89

## 2018-09-16 NOTE — Progress Notes (Signed)
Subjective: [name] presents with h/o  diabetic neuropathy for preventative diabetic foot care with h/o painful mycotic toenails b/l and painful plantar calluses which interfere with activities of daily living. Pain is aggravated when weightbearing, wearing enclosed shoe gear and is relieved with periodic professional debridement.  Henson, Vickie L, NP-C is her PCP. Last visit 06/02/2018.   Current Outpatient Medications:  .  acetaminophen-codeine (TYLENOL #3) 300-30 MG tablet, TK 1 T PO Q 4 H PRN FOR PAIN, Disp: , Rfl:  .  amLODipine (NORVASC) 10 MG tablet, Take 10 mg by mouth daily., Disp: , Rfl:  .  amLODipine-valsartan (EXFORGE) 10-160 MG tablet, 1 (one) Tablet daily, Disp: , Rfl:  .  aspirin 81 MG tablet, Take 81 mg by mouth at bedtime. , Disp: , Rfl:  .  atorvastatin (LIPITOR) 40 MG tablet, Take 1 tablet (40 mg total) by mouth daily., Disp: 90 tablet, Rfl: 3 .  Blood Glucose Monitoring Suppl (ACCU-CHEK GUIDE) w/Device KIT, USE AS DIRECTED, Disp: 1 kit, Rfl: 0 .  carvedilol (COREG) 25 MG tablet, Take 25 mg by mouth 2 (two) times daily with a meal., Disp: , Rfl: 0 .  digoxin (LANOXIN) 0.125 MG tablet, 1 po qd, Disp: , Rfl:  .  ferrous sulfate 325 (65 FE) MG tablet, Take 325 mg by mouth 2 (two) times daily with a meal., Disp: , Rfl:  .  furosemide (LASIX) 40 MG tablet, Take 40 mg by mouth 2 (two) times daily. , Disp: , Rfl: 1 .  glimepiride (AMARYL) 2 MG tablet, 2 (two) Tablet daily, Disp: , Rfl:  .  Glucose Blood (BLOOD GLUCOSE TEST STRIPS) STRP, Test once a day. 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  hydrALAZINE (APRESOLINE) 25 MG tablet, Take 1 tablet (25 mg total) by mouth 2 (two) times daily., Disp: 180 tablet, Rfl: 3 .  mirabegron ER (MYRBETRIQ) 50 MG TB24 tablet, Take 1 tablet (50 mg total) by mouth daily. (Patient not taking: Reported on 08/25/2018), Disp: 30 tablet, Rfl: 2 .  NON FORMULARY, [facility]  Onychomycosis Nail Lacquer -  Fluconazole 2%,  Terbinafine 1% DMSO Apply to affected nail once daily Qty. 120 gm 3 refills, Disp: , Rfl:  .  OXYGEN, Inhale 2 L into the lungs at bedtime. , Disp: , Rfl:  .  terazosin (HYTRIN) 2 MG capsule, 1 Capsule at bedtime, Disp: , Rfl:  .  Vitamin D, Ergocalciferol, (DRISDOL) 50000 units CAPS capsule, Take 50,000 Units by mouth every 30 (thirty) days. Weekly for 12 weeks then monthly , Disp: , Rfl:   Allergies  Allergen Reactions  . Amlodipine Besylate   . Benazepril Hcl   . Lotrel [Amlodipine Besy-Benazepril Hcl]   . Talwin [Pentazocine]     Hallulate    Vascular Examination: Capillary refill time <3 seconds x 10 digits  Dorsalis pedis present b/l  Posterior tibial pulses absent b/l  No digital hair x 10 digits  Skin temperature WNL b/l  Dermatological Examination: Skin with normal turgor, texture and tone b/l.  Toenails 1-5 b/l discolored, thick, dystrophic with subungual debris and pain with palpation to nailbeds due to thickness of nails.  Hyperkeratotic lesions noted submetatarsal head 5 b/l, subhallux right foot. No erythema, no edema, no drainage.  Musculoskeletal: Muscle strength 5/5 to all LE muscle groups  Neurological: Sensation diminished with 10 gram monofilament.  Vibratory sensation diminished  Assessment: 1. Painful onychomycosis toenails 1-5 b/l 2. Calluses submetatarsal head 5 b/l, subhallux right foot 3.  NIDDM with Diabetic neuropathy  Plan: 1. Continue diabetic foot care principles.  2. Toenails 1-5 b/l were debrided in length and girth without iatrogenic bleeding. 3. Hyperkeratotic lesion pared with sterile scalpel blade. Minute amount of bleeding with hallux paring addressed with Lumicaine Hemostatic solution and cleansed with alcohol. Daughter instructed to apply Polysporin Ointment to right hallux once daily for one week. 4. Patient to continue soft, supportive shoe gear. 5. Patient to report any pedal injuries to medical professional. 6. Follow up 3  months.  7. Patient/POA to call should there be a concern in the interim.

## 2018-09-16 NOTE — Patient Instructions (Signed)
Onychomycosis/Fungal Toenails  WHAT IS IT? An infection that lies within the keratin of your nail plate that is caused by a fungus.  WHY ME? Fungal infections affect all ages, sexes, races, and creeds.  There may be many factors that predispose you to a fungal infection such as age, coexisting medical conditions such as diabetes, or an autoimmune disease; stress, medications, fatigue, genetics, etc.  Bottom line: fungus thrives in a warm, moist environment and your shoes offer such a location.  IS IT CONTAGIOUS? Theoretically, yes.  You do not want to share shoes, nail clippers or files with someone who has fungal toenails.  Walking around barefoot in the same room or sleeping in the same bed is unlikely to transfer the organism.  It is important to realize, however, that fungus can spread easily from one nail to the next on the same foot.  HOW DO WE TREAT THIS?  There are several ways to treat this condition.  Treatment may depend on many factors such as age, medications, pregnancy, liver and kidney conditions, etc.  It is best to ask your doctor which options are available to you.  1. No treatment.   Unlike many other medical concerns, you can live with this condition.  However for many people this can be a painful condition and may lead to ingrown toenails or a bacterial infection.  It is recommended that you keep the nails cut short to help reduce the amount of fungal nail. 2. Topical treatment.  These range from herbal remedies to prescription strength nail lacquers.  About 40-50% effective, topicals require twice daily application for approximately 9 to 12 months or until an entirely new nail has grown out.  The most effective topicals are medical grade medications available through physicians offices. 3. Oral antifungal medications.  With an 80-90% cure rate, the most common oral medication requires 3 to 4 months of therapy and stays in your system for a year as the new nail grows out.  Oral  antifungal medications do require blood work to make sure it is a safe drug for you.  A liver function panel will be performed prior to starting the medication and after the first month of treatment.  It is important to have the blood work performed to avoid any harmful side effects.  In general, this medication safe but blood work is required. 4. Laser Therapy.  This treatment is performed by applying a specialized laser to the affected nail plate.  This therapy is noninvasive, fast, and non-painful.  It is not covered by insurance and is therefore, out of pocket.  The results have been very good with a 80-95% cure rate.  [facility] is the only practice in the area to offer this therapy. 5. Permanent Nail Avulsion.  Removing the entire nail so that a new nail will not grow back. Diabetes Mellitus and Foot Care Foot care is an important part of your health, especially when you have diabetes. Diabetes may cause you to have problems because of poor blood flow (circulation) to your feet and legs, which can cause your skin to:  Become thinner and drier.  Break more easily.  Heal more slowly.  Peel and crack. You may also have nerve damage (neuropathy) in your legs and feet, causing decreased feeling in them. This means that you may not notice minor injuries to your feet that could lead to more serious problems. Noticing and addressing any potential problems early is the best way to prevent future  foot problems. How to care for your feet Foot hygiene  Wash your feet daily with warm water and mild soap. Do not use hot water. Then, pat your feet and the areas between your toes until they are completely dry. Do not soak your feet as this can dry your skin.  Trim your toenails straight across. Do not dig under them or around the cuticle. File the edges of your nails with an emery board or nail file.  Apply a moisturizing lotion or petroleum jelly to the skin on your feet and to dry, brittle  toenails. Use lotion that does not contain alcohol and is unscented. Do not apply lotion between your toes. Shoes and socks  Wear clean socks or stockings every day. Make sure they are not too tight. Do not wear knee-high stockings since they may decrease blood flow to your legs.  Wear shoes that fit properly and have enough cushioning. Always look in your shoes before you put them on to be sure there are no objects inside.  To break in new shoes, wear them for just a few hours a day. This prevents injuries on your feet. Wounds, scrapes, corns, and calluses  Check your feet daily for blisters, cuts, bruises, sores, and redness. If you cannot see the bottom of your feet, use a mirror or ask someone for help.  Do not cut corns or calluses or try to remove them with medicine.  If you find a minor scrape, cut, or break in the skin on your feet, keep it and the skin around it clean and dry. You may clean these areas with mild soap and water. Do not clean the area with peroxide, alcohol, or iodine.  If you have a wound, scrape, corn, or callus on your foot, look at it several times a day to make sure it is healing and not infected. Check for: ? Redness, swelling, or pain. ? Fluid or blood. ? Warmth. ? Pus or a bad smell. General instructions  Do not cross your legs. This may decrease blood flow to your feet.  Do not use heating pads or hot water bottles on your feet. They may burn your skin. If you have lost feeling in your feet or legs, you may not know this is happening until it is too late.  Protect your feet from hot and cold by wearing shoes, such as at the beach or on hot pavement.  Schedule a complete foot exam at least once a year (annually) or more often if you have foot problems. If you have foot problems, report any cuts, sores, or bruises to your health care provider immediately. Contact a health care provider if:  You have a medical condition that increases your risk of  infection and you have any cuts, sores, or bruises on your feet.  You have an injury that is not healing.  You have redness on your legs or feet.  You feel burning or tingling in your legs or feet.  You have pain or cramps in your legs and feet.  Your legs or feet are numb.  Your feet always feel cold.  You have pain around a toenail. Get help right away if:  You have a wound, scrape, corn, or callus on your foot and: ? You have pain, swelling, or redness that gets worse. ? You have fluid or blood coming from the wound, scrape, corn, or callus. ? Your wound, scrape, corn, or callus feels warm to the touch. ? You have  pus or a bad smell coming from the wound, scrape, corn, or callus. ? You have a fever. ? You have a red line going up your leg. Summary  Check your feet every day for cuts, sores, red spots, swelling, and blisters.  Moisturize feet and legs daily.  Wear shoes that fit properly and have enough cushioning.  If you have foot problems, report any cuts, sores, or bruises to your health care provider immediately.  Schedule a complete foot exam at least once a year (annually) or more often if you have foot problems. This information is not intended to replace advice given to you by your health care provider. Make sure you discuss any questions you have with your health care provider. Document Released: 07/03/2000 Document Revised: 08/18/2017 Document Reviewed: 08/07/2016 Elsevier Interactive Patient Education  2019 [place] are small areas of thickened skin that occur on the top, sides, or tip of a toe. They contain a cone-shaped core with a point that can press on a nerve below. This causes pain.  Calluses are areas of thickened skin that can occur anywhere on the body, including the hands, fingers, palms, soles of the feet, and heels. Calluses are usually larger than corns. What are the causes? Corns and calluses are caused by rubbing  (friction) or pressure, such as from shoes that are too tight or do not fit properly. What increases the risk? Corns are more likely to develop in people who have misshapen toes (toe deformities), such as hammer toes. Calluses can occur with friction to any area of the skin. They are more likely to develop in people who:  Work with their hands.  Wear shoes that fit poorly, are too tight, or are high-heeled.  Have toe deformities. What are the signs or symptoms? Symptoms of a corn or callus include:  A hard growth on the skin.  Pain or tenderness under the skin.  Redness and swelling.  Increased discomfort while wearing tight-fitting shoes, if your feet are affected. If a corn or callus becomes infected, symptoms may include:  Redness and swelling that gets worse.  Pain.  Fluid, blood, or pus draining from the corn or callus. How is this diagnosed? Corns and calluses may be diagnosed based on your symptoms, your medical history, and a physical exam. How is this treated? Treatment for corns and calluses may include:  Removing the cause of the friction or pressure. This may involve: ? Changing your shoes. ? Wearing shoe inserts (orthotics) or other protective layers in your shoes, such as a corn pad. ? Wearing gloves.  Applying medicine to the skin (topical medicine) to help soften skin in the hardened, thickened areas.  Removing layers of dead skin with a file to reduce the size of the corn or callus.  Removing the corn or callus with a scalpel or laser.  Taking antibiotic medicines, if your corn or callus is infected.  Having surgery, if a toe deformity is the cause. Follow these instructions at home:   Take over-the-counter and prescription medicines only as told by your health care provider.  If you were prescribed an antibiotic, take it as told by your health care provider. Do not stop taking it even if your condition starts to improve.  Wear shoes that fit  well. Avoid wearing high-heeled shoes and shoes that are too tight or too loose.  Wear any padding, protective layers, gloves, or orthotics as told by your health care provider.  Soak your  hands or feet and then use a file or pumice stone to soften your corn or callus. Do this as told by your health care provider.  Check your corn or callus every day for symptoms of infection. Contact a health care provider if you:  Notice that your symptoms do not improve with treatment.  Have redness or swelling that gets worse.  Notice that your corn or callus becomes painful.  Have fluid, blood, or pus coming from your corn or callus.  Have new symptoms. Summary  Corns are small areas of thickened skin that occur on the top, sides, or tip of a toe.  Calluses are areas of thickened skin that can occur anywhere on the body, including the hands, fingers, palms, and soles of the feet. Calluses are usually larger than corns.  Corns and calluses are caused by rubbing (friction) or pressure, such as from shoes that are too tight or do not fit properly.  Treatment may include wearing any padding, protective layers, gloves, or orthotics as told by your health care provider. This information is not intended to replace advice given to you by your health care provider. Make sure you discuss any questions you have with your health care provider. Document Released: 04/11/2004 Document Revised: 05/19/2017 Document Reviewed: 05/19/2017 Elsevier Interactive Patient Education  2019 [employer].

## 2018-09-22 ENCOUNTER — Encounter: Payer: Self-pay | Admitting: Cardiovascular Disease

## 2018-09-22 ENCOUNTER — Ambulatory Visit: Payer: Medicare PPO | Admitting: Cardiovascular Disease

## 2018-09-22 VITALS — BP 124/62 | HR 67 | Ht 62.0 in | Wt 170.0 lb

## 2018-09-22 DIAGNOSIS — E1169 Type 2 diabetes mellitus with other specified complication: Secondary | ICD-10-CM

## 2018-09-22 DIAGNOSIS — I471 Supraventricular tachycardia: Secondary | ICD-10-CM | POA: Diagnosis not present

## 2018-09-22 DIAGNOSIS — Z95810 Presence of automatic (implantable) cardiac defibrillator: Secondary | ICD-10-CM

## 2018-09-22 DIAGNOSIS — I5042 Chronic combined systolic (congestive) and diastolic (congestive) heart failure: Secondary | ICD-10-CM

## 2018-09-22 DIAGNOSIS — I10 Essential (primary) hypertension: Secondary | ICD-10-CM

## 2018-09-22 DIAGNOSIS — I2510 Atherosclerotic heart disease of native coronary artery without angina pectoris: Secondary | ICD-10-CM

## 2018-09-22 DIAGNOSIS — G4733 Obstructive sleep apnea (adult) (pediatric): Secondary | ICD-10-CM

## 2018-09-22 DIAGNOSIS — E669 Obesity, unspecified: Secondary | ICD-10-CM

## 2018-09-22 DIAGNOSIS — N184 Chronic kidney disease, stage 4 (severe): Secondary | ICD-10-CM

## 2018-09-22 DIAGNOSIS — E7800 Pure hypercholesterolemia, unspecified: Secondary | ICD-10-CM | POA: Diagnosis not present

## 2018-09-22 DIAGNOSIS — I2721 Secondary pulmonary arterial hypertension: Secondary | ICD-10-CM | POA: Diagnosis not present

## 2018-09-22 NOTE — Progress Notes (Signed)
Cardiology Office Note:    Date:  09/22/2018   ID:  [name], DOB 09-15-1940, MRN [id number]  PCP:  Girtha Rm, NP-C  Cardiologist:  Sanda Klein, MD   Referring MD: Girtha Rm, NP-C   Chief complaint: CHF, ICD  History of Present Illness:    [name] is a 78 y.o. female with a hx of nonischemic cardiomyopathy (previous EF 35%, most recent EF 45%), minor nonobstructive coronary artery disease (last heart cath December 2015), status post defibrillator ([employer]onic [place] dual-chamber, implanted 2013, Sprint [place] 6949-lead under advisory) severe pulmonary hypertension, obstructive sleep apnea, restrictive lung disease, essential hypertension, hypercholesterolemia type 2 diabetes mellitus, which is currently diet controlled.  She is generally doing well and has NYHA functional class II exertional dyspnea.  Self-care and walking through the house does not cause dyspnea.  She sleeps chronically on 2 pillows.  She will become short of breath walking across the parking lot.  She has not had problems with leg edema, orthopnea or PND and she denies angina pectoris, syncope or palpitations.  She does feel "bad" shortly after taking her morning medications.  She has to lay down.  She has a hard time being more specific about her complaints.  She has not had any episodes of heart failure decompensation requiring diuretic dose adjustments or hospitalization.  She does not take RAAS inhibitors due to renal insufficiency (sees Dr. [name]).  Recent defibrillator download did not show any meaningful new problems.  She did not have ventricular tachycardia or ICD therapies.  She did have a 12-minute episode of high atrial rates that was probably ectopic atrial tachycardia or slow atrial flutter (210 bpm with 2:1 AV block).  Similar SVT has been occasionally seen in the past.  No true atrial fibrillation has been recorded.  She does have problems with lower extremity edema but she  self manages this by adjusting her dose of diuretic.  If she develops weight gain and ankle swelling she increases her dose of furosemide from 40 mg daily to 80 mg daily.  She only takes the diuretic 6 days a week.  She has to increase the dose of diuretic to the higher dose no more than twice a week on the average.  She is compliant with daily weights and is quite knowledgeable about sodium restriction.  She estimates her "dry weight" on her home scale at about 180 pounds.  Her most recent echocardiogram was performed in October 2016 and showed a left ventricular ejection fraction of 45% with a mildly dilated left ventricle measuring 5.3 cm in end diastole.  Mitral inflow showed a pattern of abnormal relaxation, consistent with the absence of elevated mean left atrial pressure, the E/e' prime ratio at the medial annulus was however elevated at 13.  The same echocardiogram also showed mild mitral insufficiency and mild to moderate tricuspid insufficiency and a mildly dilated left atrium.  The right atrium and right ventricle was described as normal.  There is a mention of mild aortic stenosis with a valve area calculated at 1.8 cm, but the gradients are quite normal (peak 10, mean 6 mmHg).  The most striking abnormality on the echo is severe pulmonary artery pressure with an estimated systolic pressure of 70 mmHg assuming a right atrial pressure of 7 mmHg.  There are no objective measurements to assess right ventricular systolic function but qualitatively  the right ventricle is described as normal.    Her last cardiac catheterization was in December 2015 and  described "mild plaquing and no obstructive CAD" in the left main and circumflex coronary artery, while the LAD and RCA were described as normal.  At that time the EF was 35-45% by ventriculography in the left ventricular end-diastolic pressure was 30 mmHg.  The mean pulmonary artery pressure was 25 mmHg.  The pulmonary artery pressure was 90/23 mmHg  (with systemic blood pressure 168/66).  Carotid duplex ultrasound performed in October 2017 showed minor plaque in both carotids and antegrade flow in both vertebral arteries.  Since January 2018 she has had a grand total of 14 minutes of atrial mode switch.  Review of the electrogram showed these events to primarily be paroxysmal atrial tachycardia.  This is usually conducted with some degree of AV block so that there is no high ventricular rate.  There is one exception where she had 1: 1 AV conduction, which was misinterpreted by her device is representing nonsustained ventricular tachycardia.  Lead parameters are normal.  As mentioned her ventricular lead is a Sprint [employer] [id number] under advisory but shows no evidence of SIC.  Her cardiologist in [place] for many years was Dr. Elberta Spaniel, most recently Dr. Barnie Alderman  She bears a diagnosis of restrictive lung disease and obstructive sleep apnea.  She is supposed to be on home oxygen.  She uses CPAP for severe obstructive sleep apnea with an apnea hypotony index of 59 and desaturations down to 80%.  Despite treatment with CPAP it has been very difficult to control her AHI.  She plans to follow-up with Dr. Halford Chessman or with Dr. Elsworth Soho in the pulmonary clinic.  The report stated that prior PFTs showed no obstruction but fairly significant restriction and reduced DLCO but repeat PFTs today were improved, CT chest was unremarkable and the feeling was that her initial PFTs may have not been accurate.  Most recent hemoglobin A1c is documented as being 6.1%, on glimepiride monotherapy.  She is not on statin therapy, but her previous notes report that she was taking atorvastatin 40 mg daily and her LDL was 36.[name]  Her baseline creatinine seems to be 1.6-1.9.  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  She had a pulmonary embolism 40 years ago.  EGD performed in 2011 showed mild erosive esophagitis and gastritis.  She was prescribed  Protonix.  She has a remote history of depression for which she is not currently receiving any medications.  She has never smoked and denies use of alcohol.    Recent labs are significant for a digoxin level of less than 0.4, BUN 22, creatinine 1.6, glucose 138, cholesterol 127, HDL 83, LDL 36, triglycerides 42, hemoglobin A1c 6.8%, hemoglobin 11.6, platelets 220 3K.  These labs were all performed in October 2018  Past Medical History:  Diagnosis Date  . Anemia   . CAD (coronary artery disease)   . Cardiac defibrillator in situ   . Cataract    bilateral 2017  . CHF (congestive heart failure) ([place])   . Chronic systolic heart failure ([place])   . CKD (chronic kidney disease) stage 4, GFR 15-29 ml/min (HCC)   . COPD (chronic obstructive pulmonary disease) ([place])   . HTN (hypertension)   . Malignant neoplasm of rectum ([place]) 09/08/2017   Dr. Rollene Rotunda in [place]. Per notes she was due for colonoscopy in 12/2016  . OSA on CPAP 09/08/2017  . Other cardiomyopathies ([place])   . Oxygen deficiency    2 liters at night  . Pulmonary HTN ([place])   .  Sleep apnea    uses c-pap  . Stroke ([place])   . Type 2 diabetes, controlled, with peripheral neuropathy ([place])   . Urge incontinence of urine     Past Surgical History:  Procedure Laterality Date  . BIOPSY  03/29/2018   Procedure: BIOPSY;  Surgeon: Yetta Flock, MD;  Location: [facility];  Service: Gastroenterology;;  . COLONOSCOPY    . COLONOSCOPY WITH PROPOFOL N/A 03/29/2018   Procedure: COLONOSCOPY WITH PROPOFOL;  Surgeon: Yetta Flock, MD;  Location: [facility];  Service: Gastroenterology;  Laterality: N/A;  . ESOPHAGOGASTRODUODENOSCOPY (EGD) WITH PROPOFOL N/A 03/29/2018   Procedure: ESOPHAGOGASTRODUODENOSCOPY (EGD) WITH PROPOFOL;  Surgeon: Yetta Flock, MD;  Location: [facility];  Service: Gastroenterology;  Laterality: N/A;  . POLYPECTOMY  03/29/2018   Procedure: POLYPECTOMY;  Surgeon: Yetta Flock, MD;  Location: [facility]  ENDOS[facility];  Service: Gastroenterology;;  . TONSILLECTOMY AND ADENOIDECTOMY     age 68  . TUBAL LIGATION     years ago  . UPPER GASTROINTESTINAL ENDOSCOPY    . UPPER GI ENDOSCOPY  11/28/2009   mild erosive esophagitis, mild erosive changes in stomach, chronic constipation    Current Medications: Current Meds  Medication Sig  . acetaminophen-codeine (TYLENOL #3) 300-30 MG tablet TK 1 T PO Q 4 H PRN FOR PAIN  . amLODipine (NORVASC) 10 MG tablet Take 10 mg by mouth daily.  [name] aspirin 81 MG tablet Take 81 mg by mouth at bedtime.   [name] atorvastatin (LIPITOR) 40 MG tablet Take 1 tablet (40 mg total) by mouth daily.  . Blood Glucose Monitoring Suppl (ACCU-CHEK GUIDE) w/Device KIT USE AS DIRECTED  . carvedilol (COREG) 25 MG tablet Take 25 mg by mouth 2 (two) times daily with a meal.  . ferrous sulfate 325 (65 FE) MG tablet Take 325 mg by mouth 2 (two) times daily with a meal.  . furosemide (LASIX) 40 MG tablet Take 40 mg by mouth 2 (two) times daily.   . Glucose Blood (BLOOD GLUCOSE TEST STRIPS) STRP Test once a day. Pt uses accu-chek guide dx.E11.9  . mirabegron ER (MYRBETRIQ) 50 MG TB24 tablet Take 1 tablet (50 mg total) by mouth daily.  [facility] Scar[facility]  Onychomycosis Nail Lacquer -  Fluconazole 2%, Terbinafine 1% DMSO Apply to affected nail once daily Qty. 120 gm 3 refills  . OXYGEN Inhale 2 L into the lungs at bedtime.   [name] terazosin (HYTRIN) 2 MG capsule 1 Capsule at bedtime  . Vitamin D, Ergocalciferol, (DRISDOL) 50000 units CAPS capsule Take 50,000 Units by mouth every 30 (thirty) days. Weekly for 12 weeks then monthly   . [DISCONTINUED] hydrALAZINE (APRESOLINE) 25 MG tablet Take 1 tablet (25 mg total) by mouth 2 (two) times daily.     Allergies:   Benazepril hcl; Lotrel [amlodipine besy-benazepril hcl]; and Talwin [pentazocine]   Social History   Socioeconomic History  . Marital status: Widowed    Spouse name: Not on file  . Number of children: Not on file  .  Years of education: Not on file  . Highest education level: Not on file  Occupational History  . Not on file  Social Needs  . Financial resource strain: Not on file  . Food insecurity:    Worry: Not on file    Inability: Not on file  . Transportation needs:    Medical: Not on file    Non-medical: Not on file  Tobacco Use  . Smoking status: Former Smoker  Last attempt to quit: 03/14/2009    Years since quitting: 9.5  . Smokeless tobacco: Never Used  . Tobacco comment: quit smoking 2010  Substance and Sexual Activity  . Alcohol use: No    Frequency: Never  . Drug use: No  . Sexual activity: Not on file  Lifestyle  . Physical activity:    Days per week: Not on file    Minutes per session: Not on file  . Stress: Not on file  Relationships  . Social connections:    Talks on phone: Not on file    Gets together: Not on file    Attends religious service: Not on file    Active member of club or organization: Not on file    Attends meetings of clubs or organizations: Not on file    Relationship status: Not on file  Other Topics Concern  . Not on file  Social History Narrative   Lives alone.  2 daughters and 1 granddaughter in [place]     Family History: The patient's family history is significant for a history of coronary artery disease at advanced ages.  And stroke at advanced ages ROS:   Please see the history of present illness.    All other systems are reviewed and are negative  EKGs/Labs/Other Studies Reviewed:    The following studies were reviewed today: Comprehensive defibrillator check  EKG:  EKG is ordered today.  It shows atrial paced, ventricular sensed rhythm, long AV delay 232 ms, generalized flattened T waves, hard to measure the QT interval accurately Recent Labs: 08/25/2018: ALT 11; BUN 15; Creatinine, Ser 1.41; Hemoglobin 13.0; Platelets 258; Potassium 4.2; Sodium 145  Recent Lipid Panel    Component Value Date/Time   CHOL 143 09/01/2017 1512   TRIG 49  09/01/2017 1512   HDL 92 09/01/2017 1512   CHOLHDL 1.6 09/01/2017 1512   CHOLHDL 2.3 06/25/2007 0700   VLDL 11 06/25/2007 0700   LDLCALC 41 09/01/2017 1512    Physical Exam:    VS:  BP 124/62   Pulse 67   Ht 5' 2" (1.575 m)   Wt 170 lb (77.1 kg)   BMI 31.09 kg/m     Wt Readings from Last 3 Encounters:  09/22/18 170 lb (77.1 kg)  08/25/18 180 lb 12.8 oz (82 kg)  06/02/18 187 lb 6.4 oz (85 kg)    General: Alert, oriented x3, no distress, mildly obese, healthy left subclavian defibrillator site Head: no evidence of trauma, PERRL, EOMI, no exophtalmos or lid lag, no myxedema, no xanthelasma; normal ears, nose and oropharynx Neck: normal jugular venous pulsations and no hepatojugular reflux; brisk carotid pulses without delay and no carotid bruits Chest: clear to auscultation, no signs of consolidation by percussion or palpation, normal fremitus, symmetrical and full respiratory excursions Cardiovascular: normal position and quality of the apical impulse, regular rhythm, normal first and second heart sounds (S2 does not sound as loud as it is best appointment), no murmurs, rubs or gallops Abdomen: no tenderness or distention, no masses by palpation, no abnormal pulsatility or arterial bruits, normal bowel sounds, no hepatosplenomegaly Extremities: no clubbing, cyanosis or edema; 2+ radial, ulnar and brachial pulses bilaterally; 2+ right femoral, posterior tibial and dorsalis pedis pulses; 2+ left femoral, posterior tibial and dorsalis pedis pulses; no subclavian or femoral bruits Neurological: grossly nonfocal Psych: Normal mood and affect   ASSESSMENT:    1. Chronic combined systolic (congestive) and diastolic (congestive) heart failure ([place])   2. PAH (pulmonary artery hypertension) (  [place])   3. ICD (implantable cardioverter-defibrillator) in place   4. PAT (paroxysmal atrial tachycardia) ([place])   5. Essential hypertension   6. CKD (chronic kidney disease) stage 4, GFR 15-29 ml/min  (HCC)   7. Diabetes mellitus type 2 in obese ([place])   8. Hypercholesterolemia   9. Coronary artery disease involving native coronary artery of native heart without angina pectoris   10. OSA (obstructive sleep apnea)    PLAN:    In order of problems listed above:  1. CHF: NYHA functional class II.  Clinically euvolemic.  She remains pretty sedentary.  She remains very diligent with sodium restriction and daily weight monitoring.  She weighs 10 pounds less than she did last year and I would agree calibrate her "dry weight" around 170 pounds.  She feels poorly after taking her morning medications, presumably due to hypotension.  We will stop hydralazine.  May consider reducing the dose of amlodipine as well if necessary.  Try to continue high-dose carvedilol.  Not on RAAS inhibitors due to renal dysfunction.   2. PAH: She has very severe pulmonary artery hypertension.  Suspect if she was more active this would be a major limitation.  At the time of a cardiac catheterization, although her pulmonary artery wedge pressure was moderately increased, she had a very high transpulmonary gradient suggesting that the major cause of the pulmonary hypertension is fixed arteriolar disease.  Although her pulmonary function tests appeared to be very abnormal when initially evaluated, follow-up PFTs were only mildly abnormal.  It appears that the dominant reason for her severe pulmonary hypertension might be insufficiently treated obstructive sleep apnea. 3. ICD: Normal defibrillator function.  Her RV lead is under advisory, but we are not planning lead revision unless it is dysfunctional.  She does not have ventricular pacing.   She has never received tachycardia therapies from her defibrillator.  Continue remote downloads every 3 months and yearly office visits. 4. PAT: Occasional sustained but brief episodes are recorded by her device and appear to be asymptomatic. 5. HTN: Good control, possibly excessive.  We will  stop her hydralazine 6. CKD 4: In February 2020 her creatinine was 1.4, which is an improvement and her potassium was 4.2 7. DM: Good glycemic control with hemoglobin A1c of 6.5% in November 2019. 8. HLP: Excellent LDL cholesterol last year, followed by her PCP. 9. CAD: Minor plaque at her last cardiac catheterization and minor carotid plaque by ultrasound.  On low-dose aspirin. 10. OSA: Reports compliance with CPAP. This remains the most likely reason for her disproportionate pulmonary hypertension although she also has an uncertain amount of interstitial lung disease    Medication Adjustments/Labs and Tests Ordered: Current medicines are reviewed at length with the patient today.  Concerns regarding medicines are outlined above.  Orders Placed This Encounter  Procedures  . EKG 12-Lead   No orders of the defined types were placed in this encounter.   Signed, Sanda Klein, MD  09/22/2018 2:28 PM    [place]'

## 2018-09-22 NOTE — Patient Instructions (Signed)
Medication Instructions:  STOP Hydralazine  Take Amlodipine in the middle of the day.  If you need a refill on your cardiac medications before your next appointment, please call your pharmacy.   Follow-Up: At [facility], you and your health needs are our priority.  As part of our continuing mission to provide you with exceptional heart care, we have created designated Provider Care Teams.  These Care Teams include your primary Cardiologist (physician) and Advanced Practice Providers (APPs -  Physician Assistants and Nurse Practitioners) who all work together to provide you with the care you need, when you need it. You will need a follow up appointment in 1 year.  Please call our office 2 months in advance to schedule this appointment.  You may see Sanda Klein, MD or one of the following Advanced Practice Providers on your designated Care Team: [place], [place] . Fabian Sharp, PA-C  Any Other Special Instructions Will Be Listed Below (If Applicable). None

## 2018-11-04 ENCOUNTER — Ambulatory Visit: Payer: Medicare PPO | Admitting: Gastroenterology

## 2018-11-28 ENCOUNTER — Telehealth: Payer: Self-pay | Admitting: Family Medicine

## 2018-11-28 MED ORDER — BLOOD GLUCOSE TEST VI STRP: ORAL_STRIP | 1 refills | Status: DC

## 2018-11-28 MED ORDER — ATORVASTATIN CALCIUM 40 MG PO TABS: 40.0000 mg | ORAL_TABLET | Freq: Every day | ORAL | 0 refills | Status: DC

## 2018-11-28 MED ORDER — ACCU-CHEK GUIDE W/DEVICE KIT: 1.0000 | PACK | 0 refills | Status: DC

## 2018-11-28 NOTE — Telephone Encounter (Signed)
PT IS REquesting atorvastatin 40 mg tablet, and accu-chek plus meter and accu-chek aviva plus test strips, please send to the [facility] mail order pharmacy

## 2018-11-28 NOTE — Telephone Encounter (Signed)
Sent in refills to pharmacy

## 2018-11-29 ENCOUNTER — Telehealth: Payer: Self-pay | Admitting: Family Medicine

## 2018-11-29 MED ORDER — FUROSEMIDE 40 MG PO TABS: 40.0000 mg | ORAL_TABLET | Freq: Two times a day (BID) | ORAL | 0 refills | Status: DC

## 2018-11-29 MED ORDER — CARVEDILOL 25 MG PO TABS: 25.0000 mg | ORAL_TABLET | Freq: Two times a day (BID) | ORAL | 0 refills | Status: DC

## 2018-11-29 MED ORDER — AMLODIPINE BESYLATE 10 MG PO TABS: 10.0000 mg | ORAL_TABLET | Freq: Every day | ORAL | 0 refills | Status: DC

## 2018-11-29 NOTE — Telephone Encounter (Signed)
Sent in meds

## 2018-11-29 NOTE — Telephone Encounter (Signed)
Pharmacy sent refill request for amlodipine 10 mg, carvedilol 12.5 mg , furosemide 40 mg please send to [facility] order

## 2018-12-01 ENCOUNTER — Telehealth: Payer: Self-pay | Admitting: Family Medicine

## 2018-12-01 MED ORDER — ACCU-CHEK SOFT TOUCH LANCETS MISC: 2 refills | Status: DC

## 2018-12-01 NOTE — Telephone Encounter (Signed)
done

## 2018-12-01 NOTE — Telephone Encounter (Signed)
Pharmacy sent in request for accu-chek softclix lancets, bd single use swab, accu-chek aviva solution, please send to [facility]

## 2018-12-07 ENCOUNTER — Other Ambulatory Visit: Payer: Self-pay

## 2018-12-07 ENCOUNTER — Ambulatory Visit (INDEPENDENT_AMBULATORY_CARE_PROVIDER_SITE_OTHER): Payer: Medicare PPO | Admitting: *Deleted

## 2018-12-07 ENCOUNTER — Encounter: Payer: Self-pay | Admitting: Family Medicine

## 2018-12-07 DIAGNOSIS — I428 Other cardiomyopathies: Secondary | ICD-10-CM

## 2018-12-07 LAB — CUP PACEART REMOTE DEVICE CHECK
Battery Remaining Longevity: 41 mo
Battery Voltage: 2.97 V
Brady Statistic AP VP Percent: 0.06 %
Brady Statistic AP VS Percent: 91.14 %
Brady Statistic AS VP Percent: 0 %
Brady Statistic AS VS Percent: 8.8 %
Brady Statistic RA Percent Paced: 90.5 %
Brady Statistic RV Percent Paced: 0.07 %
Date Time Interrogation Session: 20200520084224
HighPow Impedance: 40 Ohm
HighPow Impedance: 53 Ohm
Implantable Pulse Generator Implant Date: 20131120
Lead Channel Impedance Value: 342 Ohm
Lead Channel Impedance Value: 342 Ohm
Lead Channel Impedance Value: 437 Ohm
Lead Channel Pacing Threshold Amplitude: 0.625 V
Lead Channel Pacing Threshold Amplitude: 0.625 V
Lead Channel Pacing Threshold Pulse Width: 0.4 ms
Lead Channel Pacing Threshold Pulse Width: 0.4 ms
Lead Channel Sensing Intrinsic Amplitude: 0.625 mV
Lead Channel Sensing Intrinsic Amplitude: 0.625 mV
Lead Channel Sensing Intrinsic Amplitude: 4.75 mV
Lead Channel Sensing Intrinsic Amplitude: 4.75 mV
Lead Channel Setting Pacing Amplitude: 1.5 V
Lead Channel Setting Pacing Amplitude: 2 V
Lead Channel Setting Pacing Pulse Width: 0.4 ms
Lead Channel Setting Sensing Sensitivity: 0.3 mV

## 2018-12-16 ENCOUNTER — Ambulatory Visit: Payer: Medicare PPO | Admitting: Podiatry

## 2018-12-16 ENCOUNTER — Encounter: Payer: Self-pay | Admitting: Podiatry

## 2018-12-16 ENCOUNTER — Other Ambulatory Visit: Payer: Self-pay

## 2018-12-16 ENCOUNTER — Encounter: Payer: Self-pay | Admitting: Cardiology

## 2018-12-16 VITALS — Temp 97.3°F

## 2018-12-16 DIAGNOSIS — E1142 Type 2 diabetes mellitus with diabetic polyneuropathy: Secondary | ICD-10-CM | POA: Diagnosis not present

## 2018-12-16 DIAGNOSIS — B351 Tinea unguium: Secondary | ICD-10-CM | POA: Diagnosis not present

## 2018-12-16 DIAGNOSIS — L84 Corns and callosities: Secondary | ICD-10-CM | POA: Diagnosis not present

## 2018-12-16 DIAGNOSIS — M79674 Pain in right toe(s): Secondary | ICD-10-CM | POA: Diagnosis not present

## 2018-12-16 DIAGNOSIS — B353 Tinea pedis: Secondary | ICD-10-CM | POA: Diagnosis not present

## 2018-12-16 DIAGNOSIS — M79675 Pain in left toe(s): Secondary | ICD-10-CM

## 2018-12-16 MED ORDER — CICLOPIROX OLAMINE 0.77 % EX CREA: TOPICAL_CREAM | Freq: Two times a day (BID) | CUTANEOUS | 1 refills | Status: AC

## 2018-12-16 NOTE — Patient Instructions (Addendum)
Corns and Calluses Corns are small areas of thickened skin that occur on the top, sides, or tip of a toe. They contain a cone-shaped core with a point that can press on a nerve below. This causes pain.  Calluses are areas of thickened skin that can occur anywhere on the body, including the hands, fingers, palms, soles of the feet, and heels. Calluses are usually larger than corns. What are the causes? Corns and calluses are caused by rubbing (friction) or pressure, such as from shoes that are too tight or do not fit properly. What increases the risk? Corns are more likely to develop in people who have misshapen toes (toe deformities), such as hammer toes. Calluses can occur with friction to any area of the skin. They are more likely to develop in people who:  Work with their hands.  Wear shoes that fit poorly, are too tight, or are high-heeled.  Have toe deformities. What are the signs or symptoms? Symptoms of a corn or callus include:  A hard growth on the skin.  Pain or tenderness under the skin.  Redness and swelling.  Increased discomfort while wearing tight-fitting shoes, if your feet are affected. If a corn or callus becomes infected, symptoms may include:  Redness and swelling that gets worse.  Pain.  Fluid, blood, or pus draining from the corn or callus. How is this diagnosed? Corns and calluses may be diagnosed based on your symptoms, your medical history, and a physical exam. How is this treated? Treatment for corns and calluses may include:  Removing the cause of the friction or pressure. This may involve: ? Changing your shoes. ? Wearing shoe inserts (orthotics) or other protective layers in your shoes, such as a corn pad. ? Wearing gloves.  Applying medicine to the skin (topical medicine) to help soften skin in the hardened, thickened areas.  Removing layers of dead skin with a file to reduce the size of the corn or callus.  Removing the corn or callus with a  scalpel or laser.  Taking antibiotic medicines, if your corn or callus is infected.  Having surgery, if a toe deformity is the cause. Follow these instructions at home:   Take over-the-counter and prescription medicines only as told by your health care provider.  If you were prescribed an antibiotic, take it as told by your health care provider. Do not stop taking it even if your condition starts to improve.  Wear shoes that fit well. Avoid wearing high-heeled shoes and shoes that are too tight or too loose.  Wear any padding, protective layers, gloves, or orthotics as told by your health care provider.  Soak your hands or feet and then use a file or pumice stone to soften your corn or callus. Do this as told by your health care provider.  Check your corn or callus every day for symptoms of infection. Contact a health care provider if you:  Notice that your symptoms do not improve with treatment.  Have redness or swelling that gets worse.  Notice that your corn or callus becomes painful.  Have fluid, blood, or pus coming from your corn or callus.  Have new symptoms. Summary  Corns are small areas of thickened skin that occur on the top, sides, or tip of a toe.  Calluses are areas of thickened skin that can occur anywhere on the body, including the hands, fingers, palms, and soles of the feet. Calluses are usually larger than corns.  Corns and calluses are caused by   rubbing (friction) or pressure, such as from shoes that are too tight or do not fit properly.  Treatment may include wearing any padding, protective layers, gloves, or orthotics as told by your health care provider. This information is not intended to replace advice given to you by your health care provider. Make sure you discuss any questions you have with your health care provider. Document Released: 04/11/2004 Document Revised: 05/19/2017 Document Reviewed: 05/19/2017 Elsevier Interactive Patient Education   2019 [employer]  Athlete's foot (tinea pedis) is a fungal infection of the skin on your feet. It often occurs on the skin that is between or underneath the toes. It can also occur on the soles of your feet. The infection can spread from person to person (is contagious). It can also spread when a person's bare feet come in contact with the fungus on shower floors or on items such as shoes. What are the causes? This condition is caused by a fungus that grows in warm, moist places. You can get athlete's foot by sharing shoes, shower stalls, towels, and wet floors with someone who is infected. Not washing your feet or changing your socks often enough can also lead to athlete's foot. What increases the risk? This condition is more likely to develop in:  Men.  People who have a weak body defense system (immune system).  People who have diabetes.  People who use public showers, such as at a gym.  People who wear heavy-duty shoes, such as Environmental manager.  Seasons with warm, humid weather. What are the signs or symptoms? Symptoms of this condition include:  Itchy areas between your toes or on the soles of your feet.  White, flaky, or scaly areas between your toes or on the soles of your feet.  Very itchy small blisters between your toes or on the soles of your feet.  Small cuts in your skin. These cuts can become infected.  Thick or discolored toenails. How is this diagnosed? This condition may be diagnosed with a physical exam and a review of your medical history. Your health care provider may also take a skin or toenail sample to examine under a microscope. How is this treated? This condition is treated with antifungal medicines. These may be applied as powders, ointments, or creams. In severe cases, an oral antifungal medicine may be given. Follow these instructions at home: Medicines  Apply or take over-the-counter and prescription medicines only as  told by your health care provider.  Apply your antifungal medicine as told by your health care provider. Do not stop using the antifungal even if your condition improves. Foot care  Do not scratch your feet.  Keep your feet dry: ? Wear cotton or wool socks. Change your socks every day or if they become wet. ? Wear shoes that allow air to flow, such as sandals or canvas tennis shoes.  Wash and dry your feet, including the area between your toes. Also, wash and dry your feet: ? Every day or as told by your health care provider. ? After exercising. General instructions  Do not let others use towels, shoes, nail clippers, or other personal items that touch your feet.  Protect your feet by wearing sandals in wet areas, such as locker rooms and shared showers.  Keep all follow-up visits as told by your health care provider. This is important.  If you have diabetes, keep your blood sugar under control. Contact a health care provider if:  You have  a fever.  You have swelling, soreness, warmth, or redness in your foot.  Your feet are not getting better with treatment.  Your symptoms get worse.  You have new symptoms. Summary  Athlete's foot (tinea pedis) is a fungal infection of the skin on your feet. It often occurs on skin that is between or underneath the toes.  This condition is caused by a fungus that grows in warm, moist places.  Symptoms include white, flaky, or scaly areas between your toes or on the soles of your feet.  This condition is treated with antifungal medicines.  Keep your feet clean. Always dry them thoroughly. This information is not intended to replace advice given to you by your health care provider. Make sure you discuss any questions you have with your health care provider. Document Released: 07/03/2000 Document Revised: 04/26/2017 Document Reviewed: 04/26/2017 Elsevier Interactive Patient Education  2019 [employer]  Onychomycosis/Fungal Toenails   WHAT IS IT? An infection that lies within the keratin of your nail plate that is caused by a fungus.  WHY ME? Fungal infections affect all ages, sexes, races, and creeds.  There may be many factors that predispose you to a fungal infection such as age, coexisting medical conditions such as diabetes, or an autoimmune disease; stress, medications, fatigue, genetics, etc.  Bottom line: fungus thrives in a warm, moist environment and your shoes offer such a location.  IS IT CONTAGIOUS? Theoretically, yes.  You do not want to share shoes, nail clippers or files with someone who has fungal toenails.  Walking around barefoot in the same room or sleeping in the same bed is unlikely to transfer the organism.  It is important to realize, however, that fungus can spread easily from one nail to the next on the same foot.  HOW DO WE TREAT THIS?  There are several ways to treat this condition.  Treatment may depend on many factors such as age, medications, pregnancy, liver and kidney conditions, etc.  It is best to ask your doctor which options are available to you.  1. No treatment.   Unlike many other medical concerns, you can live with this condition.  However for many people this can be a painful condition and may lead to ingrown toenails or a bacterial infection.  It is recommended that you keep the nails cut short to help reduce the amount of fungal nail. 2. Topical treatment.  These range from herbal remedies to prescription strength nail lacquers.  About 40-50% effective, topicals require twice daily application for approximately 9 to 12 months or until an entirely new nail has grown out.  The most effective topicals are medical grade medications available through physicians offices. 3. Oral antifungal medications.  With an 80-90% cure rate, the most common oral medication requires 3 to 4 months of therapy and stays in your system for a year as the new nail grows out.  Oral antifungal medications do require  blood work to make sure it is a safe drug for you.  A liver function panel will be performed prior to starting the medication and after the first month of treatment.  It is important to have the blood work performed to avoid any harmful side effects.  In general, this medication safe but blood work is required. 4. Laser Therapy.  This treatment is performed by applying a specialized laser to the affected nail plate.  This therapy is noninvasive, fast, and non-painful.  It is not covered by insurance and is therefore, out of  pocket.  The results have been very good with a 80-95% cure rate.  [facility] is the only practice in the area to offer this therapy. 5. Permanent Nail Avulsion.  Removing the entire nail so that a new nail will not grow back.

## 2018-12-16 NOTE — Progress Notes (Signed)
Remote ICD transmission.

## 2018-12-18 ENCOUNTER — Encounter: Payer: Self-pay | Admitting: Podiatry

## 2018-12-18 NOTE — Progress Notes (Signed)
Subjective:  [name] presents to clinic today with cc of  painful, thick, discolored, elongated toenails 1-5 b/l that become tender and cannot cut because of thickness.  Pain is aggravated when wearing enclosed shoe gear.  Henson, Vickie L, NP-C is her PCP.    Allergies  Allergen Reactions  . Benazepril Hcl   . Lotrel [Amlodipine Besy-Benazepril Hcl]   . Talwin [Pentazocine]     Hallulate     Objective: Vitals:   12/16/18 0738  Temp: (!) 97.3 F (36.3 C)    Physical Examination:  Vascular Examination: Capillary refill time <3 seconds  x 10 digits.  Palpable DP pulses b/l.  Nonpalpable PT pulses b/l.   Digital hair absent.   No edema noted b/l.  Skin temperature gradient WNL b/l.  Dermatological Examination: Skin with normal turgor, texture and tone b/l.  No open wounds b/l.  No interdigital macerations noted b/l.  Elongated, thick, discolored brittle toenails with subungual debris and pain on dorsal palpation of nailbeds 1-5 b/l.  Hyperkeratotic lesion submet head 5 b/l, sub hallux right foot with tenderness to palpation. No edema, no erythema, no drainage, no flocculence.  Diffuse scaling noted peripherally and plantarly b/l feet with mild foot odor. No interdigital macerations.  No blisters, no weeping. No signs of secondary bacterial infection noted.  Musculoskeletal Examination: Muscle strength 5/5 to all muscle groups b/l.  No pain, crepitus or joint discomfort with active/passive ROM.  Neurological Examination: Sensation diminished b/l with 10 gram monofilament.  Vibratory sensation diminished b/l.  Assessment: Mycotic nail infection with pain 1-5 b/l Calluses submet head 5 b/l, sub hallux right foot Tinea pedis b/l NIDDM with neuropathy  Plan: 1. Toenails 1-5 b/l were debrided in length and girth without iatrogenic laceration. 2. Calluses pared submetatarsal head(s) 5 b/l, sub hallux right foot utilizing sterile scalpel blade  without incident. 3. Rx for Clotrimazole Cream 1% to be applied to both feet and between toes bid x 4 weeks. 4. Continue soft, supportive shoe gear daily. 5. Report any pedal injuries to medical professional. 6. Follow up 3 months. 7. Patient/POA to call should there be a question/concern in there interim.

## 2018-12-23 ENCOUNTER — Telehealth: Payer: Self-pay | Admitting: Family Medicine

## 2018-12-23 MED ORDER — ACCU-CHEK SOFT TOUCH LANCETS MISC: 2 refills | Status: DC

## 2018-12-23 MED ORDER — ACCU-CHEK GUIDE W/DEVICE KIT: 1.0000 | PACK | 0 refills | Status: DC

## 2018-12-23 MED ORDER — BLOOD GLUCOSE TEST VI STRP: ORAL_STRIP | 1 refills | Status: DC

## 2018-12-23 NOTE — Telephone Encounter (Signed)
I have sent in new machine and testing supplies to local pharmacy

## 2018-12-23 NOTE — Telephone Encounter (Signed)
Pt called and states that her meter is not working and she needs a new one and strips to go with it. Pt can be reached at [phone] and send to [address].

## 2018-12-28 ENCOUNTER — Encounter: Payer: Self-pay | Admitting: Family Medicine

## 2018-12-28 ENCOUNTER — Ambulatory Visit (INDEPENDENT_AMBULATORY_CARE_PROVIDER_SITE_OTHER): Payer: Medicare PPO | Admitting: Family Medicine

## 2018-12-28 ENCOUNTER — Other Ambulatory Visit: Payer: Self-pay

## 2018-12-28 VITALS — BP 130/62 | HR 58 | Temp 97.0°F | Resp 20 | Wt 160.0 lb

## 2018-12-28 DIAGNOSIS — E1142 Type 2 diabetes mellitus with diabetic polyneuropathy: Secondary | ICD-10-CM

## 2018-12-28 DIAGNOSIS — N189 Chronic kidney disease, unspecified: Secondary | ICD-10-CM | POA: Diagnosis not present

## 2018-12-28 DIAGNOSIS — N3941 Urge incontinence: Secondary | ICD-10-CM | POA: Diagnosis not present

## 2018-12-28 DIAGNOSIS — I10 Essential (primary) hypertension: Secondary | ICD-10-CM

## 2018-12-28 DIAGNOSIS — I5042 Chronic combined systolic (congestive) and diastolic (congestive) heart failure: Secondary | ICD-10-CM

## 2018-12-28 DIAGNOSIS — N184 Chronic kidney disease, stage 4 (severe): Secondary | ICD-10-CM | POA: Diagnosis not present

## 2018-12-28 DIAGNOSIS — E1129 Type 2 diabetes mellitus with other diabetic kidney complication: Secondary | ICD-10-CM | POA: Diagnosis not present

## 2018-12-28 NOTE — Progress Notes (Signed)
Subjective:  Documentation for virtual telephone encounter. She does not have access to video.   The patient was located at home. 2 patient identifiers used.  The provider was located in the office. The patient did consent to this visit and is aware of possible charges through their insurance for this visit.  The other persons participating in this telemedicine service were none.    Patient ID: [name], female    DOB: 06-28-1941, 78 y.o.   MRN: [id number]  HPI Chief Complaint  Patient presents with  . 6 month follow-up    6 month follow-up, no other concerns   Due for a 6 month follow up on chronic health conditions.  She has multiple specialists involved in her care.  States she feels good, no new concerns today.   She had a recent visit with her cardiologist and states her hydralazine was stopped. BP at home is normal per patient. No concerns. Low sodium diet.   States she has upcoming appointments with her dentist and nephrologist. States she is having labs done at her nephrologist tomorrow.   Reports taking Myrebetriq only on the weekends for help with urinary incontinence when she leaves her house.   Will be attending drive in services this Sunday at her church.   Abdominal pain- pulled muscle and this resolved.   Diabetes well controlled. FBS 82 this morning. No low readings per patient.  Reports taking her statin and aspirin daily without any side effects.   Vitamin D 50,000 IU once monthly by nephrologist.   History of elevated ferritin and is currently still taking iron bid. This will be checked at her nephrology appointment per patient.   Denies fever, chills, dizziness, chest pain, palpitations, shortness of breath, abdominal pain, N/V/D, urinary symptoms.   Reviewed allergies, medications, past medical, surgical, family, and social history.   Review of Systems Pertinent positives and negatives in the history of present illness.     Objective:   Physical Exam BP 130/62   Pulse (!) 58   Temp (!) 97 F (36.1 C) (Oral)   Resp 20   Wt 160 lb (72.6 kg)   BMI 29.26 kg/m   Alert and oriented and in no acute distress.       Assessment & Plan:  Essential hypertension  Chronic combined systolic (congestive) and diastolic (congestive) heart failure (HCC)  Type 2 diabetes, controlled, with peripheral neuropathy (HCC)  Chronic kidney disease, unspecified CKD stage  Urge incontinence of urine  Discussed limitations of virtual visit. She appears to be doing well and in her usual state of health.  Cardiologist is managing her HTN. BP in goal range. Eating low sodium diet.  Diabetes well controlled with mostly normal BS readings at home.  Continue on statin, no side effects.  CKD- followed by nephrology. She is aware that her ferritin was elevated and reports this will be rechecked tomorrow.  Continue taking Myrbetriq as needed on weekends.  Per patient request and after visit summary was given to her daughter who takes care of her.  I also sent a prescription for her to have her hemoglobin A1c checked when she has labs done at her nephrologist tomorrow. Follow-up in 3 months for a Medicare annual wellness visit and follow-up on chronic health conditions.  Time spent on call was 22 minutes and in review of previous records 4 minutes total.  This virtual service is not related to other E/M service within previous 7 days.  99441 (5-55mn) 99442 (11-242m)  62035 (21-41mn)

## 2018-12-28 NOTE — Patient Instructions (Signed)
It was a pleasure talking to you today.  I am happy to hear that you are feeling so well.  Your blood pressure appears to be in goal range.  Your fasting blood sugar today of 82 is normal.  Since you are having labs done tomorrow at your kidney specialist, you may want to ask them to check your hemoglobin A1c if they are willing.  If not, that is fine and we will check in 3 months in our office. Please make sure your kidney specialist is checking your ferritin level tomorrow since this was elevated the last time you had labs done. Also, please asked them to forward me your lab results and office note.  Continue taking good care of yourself and I will see you hopefully in the office in 3 months for a Medicare wellness visit and to follow-up on chronic health conditions.

## 2019-01-03 ENCOUNTER — Other Ambulatory Visit: Payer: Self-pay | Admitting: Family Medicine

## 2019-01-03 NOTE — Telephone Encounter (Signed)
Is this okay to refill>? 

## 2019-01-09 DIAGNOSIS — I129 Hypertensive chronic kidney disease with stage 1 through stage 4 chronic kidney disease, or unspecified chronic kidney disease: Secondary | ICD-10-CM | POA: Diagnosis not present

## 2019-01-09 DIAGNOSIS — N2581 Secondary hyperparathyroidism of renal origin: Secondary | ICD-10-CM | POA: Diagnosis not present

## 2019-01-09 DIAGNOSIS — N184 Chronic kidney disease, stage 4 (severe): Secondary | ICD-10-CM | POA: Diagnosis not present

## 2019-01-09 DIAGNOSIS — N183 Chronic kidney disease, stage 3 (moderate): Secondary | ICD-10-CM | POA: Diagnosis not present

## 2019-01-09 DIAGNOSIS — D631 Anemia in chronic kidney disease: Secondary | ICD-10-CM | POA: Diagnosis not present

## 2019-01-10 ENCOUNTER — Encounter: Payer: Self-pay | Admitting: Family Medicine

## 2019-01-22 IMAGING — DX DG CHEST 2V
2 series · 2 of 2 positions shown · non-contrast
Comparison: 06/29/2007.

CLINICAL DATA: Abnormal pulmonary function tests. No current
complaints. Ex-smoker.

EXAM:
CHEST - 2 VIEW

[chest pa]
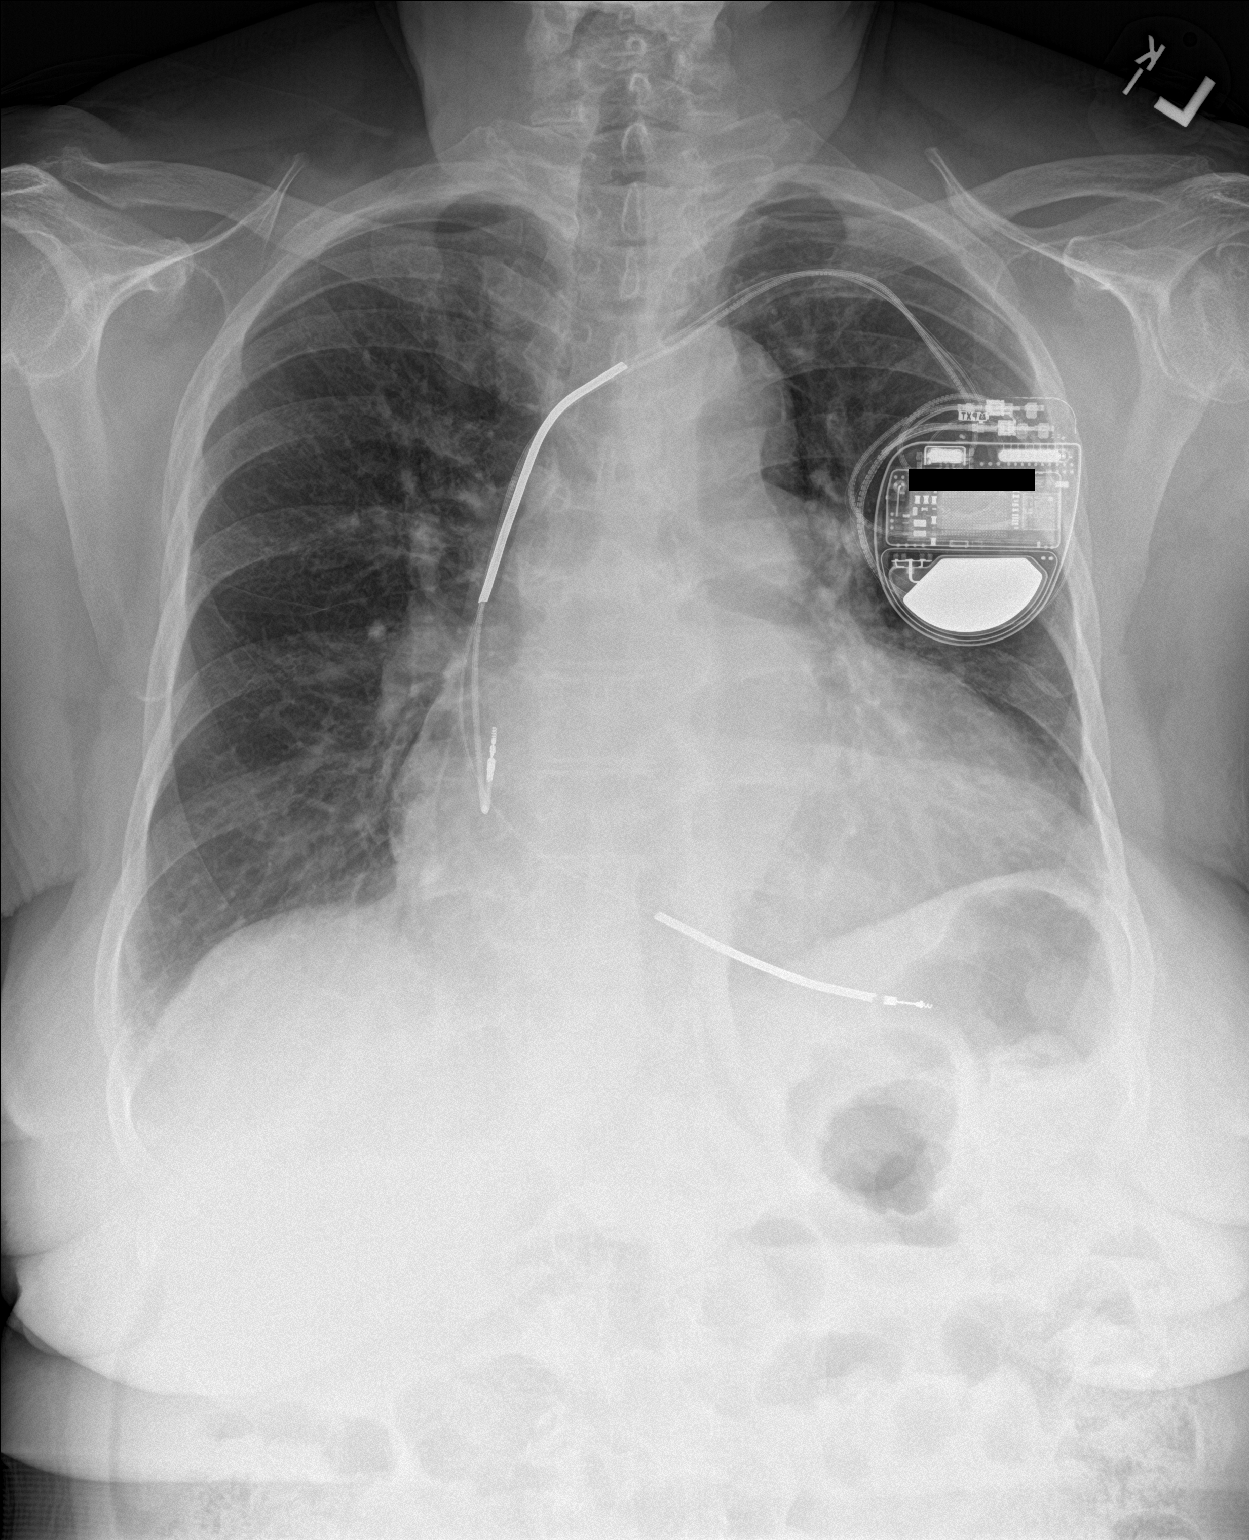

[chest lat]
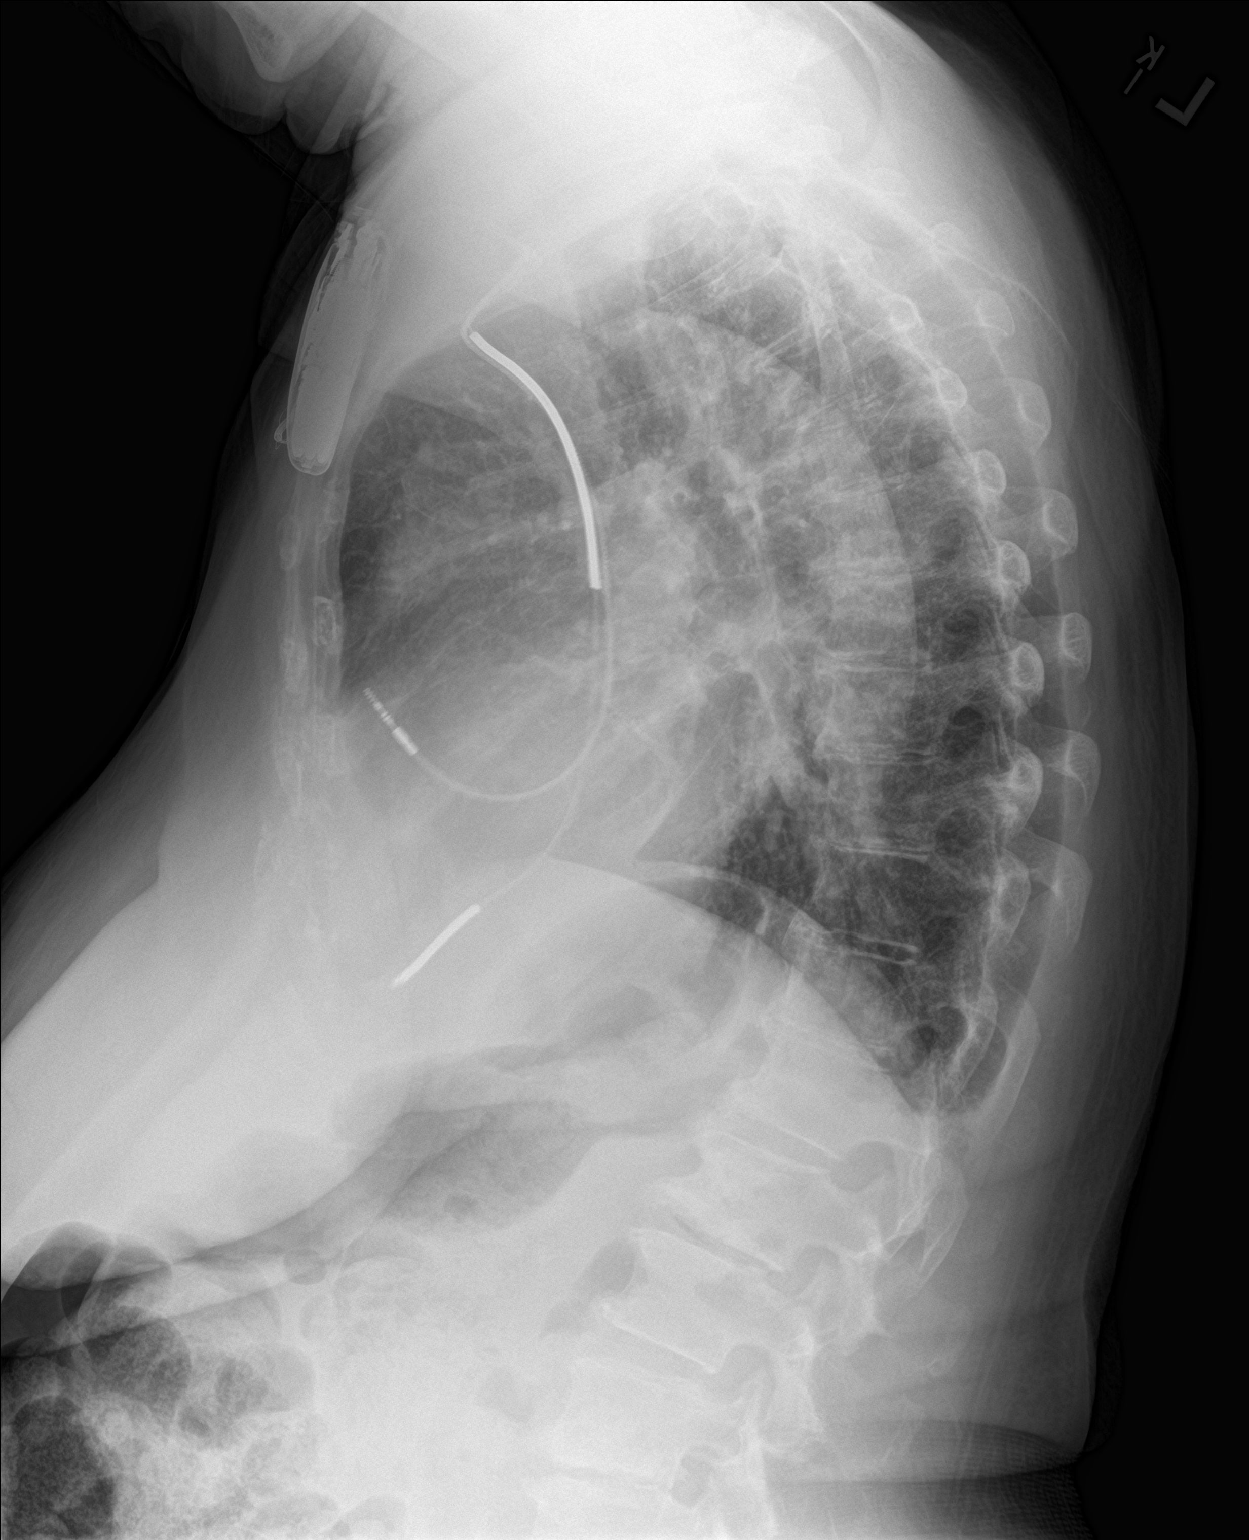

[2 of 2 positions shown; findings below may reference images not displayed]

FINDINGS: Stable enlarged cardiac silhouette and tortuous aorta. Stable left
subclavian AICD leads. Clear lungs with normal vascularity. Diffuse
peribronchial thickening. Thoracic and upper lumbar spine
degenerative changes.
IMPRESSION: 1. Moderate chronic bronchitic changes.
2. Cardiomegaly.

## 2019-01-23 ENCOUNTER — Other Ambulatory Visit: Payer: Self-pay | Admitting: Family Medicine

## 2019-02-07 DIAGNOSIS — H02831 Dermatochalasis of right upper eyelid: Secondary | ICD-10-CM | POA: Diagnosis not present

## 2019-02-07 DIAGNOSIS — H02834 Dermatochalasis of left upper eyelid: Secondary | ICD-10-CM | POA: Diagnosis not present

## 2019-02-07 DIAGNOSIS — E119 Type 2 diabetes mellitus without complications: Secondary | ICD-10-CM | POA: Diagnosis not present

## 2019-02-07 DIAGNOSIS — Z961 Presence of intraocular lens: Secondary | ICD-10-CM | POA: Diagnosis not present

## 2019-02-07 LAB — HM DIABETES EYE EXAM

## 2019-02-09 ENCOUNTER — Other Ambulatory Visit: Payer: Self-pay | Admitting: Family Medicine

## 2019-02-09 DIAGNOSIS — Z1231 Encounter for screening mammogram for malignant neoplasm of breast: Secondary | ICD-10-CM

## 2019-02-17 ENCOUNTER — Other Ambulatory Visit: Payer: Self-pay | Admitting: Family Medicine

## 2019-02-20 ENCOUNTER — Other Ambulatory Visit: Payer: Self-pay | Admitting: Family Medicine

## 2019-03-08 ENCOUNTER — Ambulatory Visit (INDEPENDENT_AMBULATORY_CARE_PROVIDER_SITE_OTHER): Payer: Medicare PPO | Admitting: *Deleted

## 2019-03-08 DIAGNOSIS — I428 Other cardiomyopathies: Secondary | ICD-10-CM

## 2019-03-08 LAB — CUP PACEART REMOTE DEVICE CHECK
Battery Remaining Longevity: 38 mo
Battery Voltage: 2.97 V
Brady Statistic AP VP Percent: 0.05 %
Brady Statistic AP VS Percent: 80.02 %
Brady Statistic AS VP Percent: 0.01 %
Brady Statistic AS VS Percent: 19.92 %
Brady Statistic RA Percent Paced: 79.73 %
Brady Statistic RV Percent Paced: 0.07 %
Date Time Interrogation Session: 20200819041605
HighPow Impedance: 37 Ohm
HighPow Impedance: 50 Ohm
Implantable Pulse Generator Implant Date: 20131120
Lead Channel Impedance Value: 323 Ohm
Lead Channel Impedance Value: 380 Ohm
Lead Channel Impedance Value: 437 Ohm
Lead Channel Pacing Threshold Amplitude: 0.625 V
Lead Channel Pacing Threshold Amplitude: 0.75 V
Lead Channel Pacing Threshold Pulse Width: 0.4 ms
Lead Channel Pacing Threshold Pulse Width: 0.4 ms
Lead Channel Sensing Intrinsic Amplitude: 0.75 mV
Lead Channel Sensing Intrinsic Amplitude: 0.75 mV
Lead Channel Sensing Intrinsic Amplitude: 5.625 mV
Lead Channel Sensing Intrinsic Amplitude: 5.625 mV
Lead Channel Setting Pacing Amplitude: 1.5 V
Lead Channel Setting Pacing Amplitude: 2 V
Lead Channel Setting Pacing Pulse Width: 0.4 ms
Lead Channel Setting Sensing Sensitivity: 0.3 mV

## 2019-03-16 ENCOUNTER — Encounter: Payer: Self-pay | Admitting: Cardiology

## 2019-03-16 NOTE — Progress Notes (Signed)
Remote ICD transmission.   

## 2019-03-24 ENCOUNTER — Encounter: Payer: Self-pay | Admitting: Podiatry

## 2019-03-24 ENCOUNTER — Ambulatory Visit: Payer: Medicare PPO | Admitting: Podiatry

## 2019-03-24 ENCOUNTER — Other Ambulatory Visit: Payer: Self-pay

## 2019-03-24 DIAGNOSIS — E1142 Type 2 diabetes mellitus with diabetic polyneuropathy: Secondary | ICD-10-CM | POA: Diagnosis not present

## 2019-03-24 DIAGNOSIS — M79675 Pain in left toe(s): Secondary | ICD-10-CM

## 2019-03-24 DIAGNOSIS — M79674 Pain in right toe(s): Secondary | ICD-10-CM

## 2019-03-24 DIAGNOSIS — L84 Corns and callosities: Secondary | ICD-10-CM

## 2019-03-24 DIAGNOSIS — B351 Tinea unguium: Secondary | ICD-10-CM

## 2019-03-24 NOTE — Progress Notes (Signed)
Subjective:  [name] presents to clinic today with cc of  painful, thick, discolored, elongated toenails 1-5 b/l that become tender and cannot cut because of thickness. Pain is aggravated when wearing enclosed shoe gear.  Patient states she has used the antifungal cream for her athlete's feet as prescribed.  Henson, Vickie L, NP-C    Current Outpatient Medications:  .  acetaminophen-codeine (TYLENOL #3) 300-30 MG tablet, TK 1 T PO Q 4 H PRN FOR PAIN, Disp: , Rfl:  .  amLODipine (NORVASC) 10 MG tablet, TAKE 1 TABLET EVERY DAY, Disp: 90 tablet, Rfl: 0 .  aspirin 81 MG tablet, Take 81 mg by mouth at bedtime. , Disp: , Rfl:  .  atorvastatin (LIPITOR) 40 MG tablet, TAKE 1 TABLET EVERY DAY, Disp: 90 tablet, Rfl: 0 .  Blood Glucose Monitoring Suppl (ACCU-CHEK GUIDE) w/Device KIT, 1 Dose by Other route See admin instructions., Disp: 1 kit, Rfl: 0 .  carvedilol (COREG) 25 MG tablet, TAKE 1 TABLET TWICE DAILY WITH A MEAL, Disp: 90 tablet, Rfl: 0 .  ferrous sulfate 325 (65 FE) MG tablet, Take 325 mg by mouth 2 (two) times daily with a meal., Disp: , Rfl:  .  furosemide (LASIX) 40 MG tablet, TAKE 1 TABLET TWICE DAILY, Disp: 90 tablet, Rfl: 0 .  Glucose Blood (BLOOD GLUCOSE TEST STRIPS) STRP, Test once a day. 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  Lancets (ACCU-CHEK SOFT TOUCH) lancets, Use as instructed, Disp: 100 each, Rfl: 2 .  MYRBETRIQ 50 MG TB24 tablet, TAKE 1 TABLET(50 MG) BY MOUTH DAILY, Disp: 30 tablet, Rfl: 2 .  NON FORMULARY, [facility]  Onychomycosis Nail Lacquer -  Fluconazole 2%, Terbinafine 1% DMSO Apply to affected nail once daily Qty. 120 gm 3 refills, Disp: , Rfl:  .  OXYGEN, Inhale 2 L into the lungs at bedtime. , Disp: , Rfl:  .  terazosin (HYTRIN) 2 MG capsule, 1 Capsule at bedtime, Disp: , Rfl:  .  Vitamin D, Ergocalciferol, (DRISDOL) 50000 units CAPS capsule, Take 50,000 Units by mouth every 30 (thirty) days. Weekly for 12 weeks then monthly ,  Disp: , Rfl:    Allergies  Allergen Reactions  . Benazepril Hcl   . Lotrel [Amlodipine Besy-Benazepril Hcl]   . Talwin [Pentazocine]     Hallulate     Objective:  Physical Examination:  Vascular Examination: Capillary refill time < 3 seconds x 10 digits.  Palpable DP pulses b/l.  PT pulses nonpalpable b/l.  Digital hair absent b/l.  No edema noted b/l.  Skin temperature gradient WNL b/l.  Dermatological Examination: Skin with normal turgor, texture and tone b/l.  No open wounds b/l.  No interdigital macerations noted b/l.  Elongated, thick, discolored brittle toenails with subungual debris and pain on dorsal palpation of nailbeds 1-5 b/l.  Hyperkeratotic lesion submet head 5 b/l, subhallux right foot with tenderness to palpation. No edema, no erythema, no drainage, no flocculence.  Resolved pedal scaling b/l feet indicating resolution of tinea pedis.   Musculoskeletal Examination: Muscle strength 5/5 to all muscle groups b/l  No pain, crepitus or joint discomfort with active/passive ROM.  Neurological Examination: Sensation diminished with 10 gram monofilament.  Vibratory sensation diminished b/l.  Assessment: Mycotic nail infection with pain 1-5 b/l Calluses submet head 5 b/l and sub hallux right foot NIDDM with neuropathy Resolved tinea pedis b/l  Plan: 1. Toenails 1-5 b/l were debrided in length and girth without iatrogenic laceration. 2. Calluses pared  submetatarsal head(s) 5 b/l and right hallux utilizing sterile scalpel blade without incident. 3. Continue soft, supportive shoe gear daily. 4. Report any pedal injuries to medical professional. 5. Follow up 3 months. 6. Patient/POA to call should there be a question/concern in there interim.

## 2019-03-24 NOTE — Patient Instructions (Signed)
Diabetes Mellitus and Foot Care Foot care is an important part of your health, especially when you have diabetes. Diabetes may cause you to have problems because of poor blood flow (circulation) to your feet and legs, which can cause your skin to:  Become thinner and drier.  Break more easily.  Heal more slowly.  Peel and crack. You may also have nerve damage (neuropathy) in your legs and feet, causing decreased feeling in them. This means that you may not notice minor injuries to your feet that could lead to more serious problems. Noticing and addressing any potential problems early is the best way to prevent future foot problems. How to care for your feet Foot hygiene  Wash your feet daily with warm water and mild soap. Do not use hot water. Then, pat your feet and the areas between your toes until they are completely dry. Do not soak your feet as this can dry your skin.  Trim your toenails straight across. Do not dig under them or around the cuticle. File the edges of your nails with an emery board or nail file.  Apply a moisturizing lotion or petroleum jelly to the skin on your feet and to dry, brittle toenails. Use lotion that does not contain alcohol and is unscented. Do not apply lotion between your toes. Shoes and socks  Wear clean socks or stockings every day. Make sure they are not too tight. Do not wear knee-high stockings since they may decrease blood flow to your legs.  Wear shoes that fit properly and have enough cushioning. Always look in your shoes before you put them on to be sure there are no objects inside.  To break in new shoes, wear them for just a few hours a day. This prevents injuries on your feet. Wounds, scrapes, corns, and calluses  Check your feet daily for blisters, cuts, bruises, sores, and redness. If you cannot see the bottom of your feet, use a mirror or ask someone for help.  Do not cut corns or calluses or try to remove them with medicine.  If you  find a minor scrape, cut, or break in the skin on your feet, keep it and the skin around it clean and dry. You may clean these areas with mild soap and water. Do not clean the area with peroxide, alcohol, or iodine.  If you have a wound, scrape, corn, or callus on your foot, look at it several times a day to make sure it is healing and not infected. Check for: ? Redness, swelling, or pain. ? Fluid or blood. ? Warmth. ? Pus or a bad smell. General instructions  Do not cross your legs. This may decrease blood flow to your feet.  Do not use heating pads or hot water bottles on your feet. They may burn your skin. If you have lost feeling in your feet or legs, you may not know this is happening until it is too late.  Protect your feet from hot and cold by wearing shoes, such as at the beach or on hot pavement.  Schedule a complete foot exam at least once a year (annually) or more often if you have foot problems. If you have foot problems, report any cuts, sores, or bruises to your health care provider immediately. Contact a health care provider if:  You have a medical condition that increases your risk of infection and you have any cuts, sores, or bruises on your feet.  You have an injury that is not   healing.  You have redness on your legs or feet.  You feel burning or tingling in your legs or feet.  You have pain or cramps in your legs and feet.  Your legs or feet are numb.  Your feet always feel cold.  You have pain around a toenail. Get help right away if:  You have a wound, scrape, corn, or callus on your foot and: ? You have pain, swelling, or redness that gets worse. ? You have fluid or blood coming from the wound, scrape, corn, or callus. ? Your wound, scrape, corn, or callus feels warm to the touch. ? You have pus or a bad smell coming from the wound, scrape, corn, or callus. ? You have a fever. ? You have a red line going up your leg. Summary  Check your feet every day  for cuts, sores, red spots, swelling, and blisters.  Moisturize feet and legs daily.  Wear shoes that fit properly and have enough cushioning.  If you have foot problems, report any cuts, sores, or bruises to your health care provider immediately.  Schedule a complete foot exam at least once a year (annually) or more often if you have foot problems. This information is not intended to replace advice given to you by your health care provider. Make sure you discuss any questions you have with your health care provider. Document Released: 07/03/2000 Document Revised: 08/18/2017 Document Reviewed: 08/07/2016 Elsevier Patient Education  2020 [employer]   Onychomycosis/Fungal Toenails  WHAT IS IT? An infection that lies within the keratin of your nail plate that is caused by a fungus.  WHY ME? Fungal infections affect all ages, sexes, races, and creeds.  There may be many factors that predispose you to a fungal infection such as age, coexisting medical conditions such as diabetes, or an autoimmune disease; stress, medications, fatigue, genetics, etc.  Bottom line: fungus thrives in a warm, moist environment and your shoes offer such a location.  IS IT CONTAGIOUS? Theoretically, yes.  You do not want to share shoes, nail clippers or files with someone who has fungal toenails.  Walking around barefoot in the same room or sleeping in the same bed is unlikely to transfer the organism.  It is important to realize, however, that fungus can spread easily from one nail to the next on the same foot.  HOW DO WE TREAT THIS?  There are several ways to treat this condition.  Treatment may depend on many factors such as age, medications, pregnancy, liver and kidney conditions, etc.  It is best to ask your doctor which options are available to you.  1. No treatment.   Unlike many other medical concerns, you can live with this condition.  However for many people this can be a painful condition and may lead to  ingrown toenails or a bacterial infection.  It is recommended that you keep the nails cut short to help reduce the amount of fungal nail. 2. Topical treatment.  These range from herbal remedies to prescription strength nail lacquers.  About 40-50% effective, topicals require twice daily application for approximately 9 to 12 months or until an entirely new nail has grown out.  The most effective topicals are medical grade medications available through physicians offices. 3. Oral antifungal medications.  With an 80-90% cure rate, the most common oral medication requires 3 to 4 months of therapy and stays in your system for a year as the new nail grows out.  Oral antifungal medications do require   blood work to make sure it is a safe drug for you.  A liver function panel will be performed prior to starting the medication and after the first month of treatment.  It is important to have the blood work performed to avoid any harmful side effects.  In general, this medication safe but blood work is required. 4. Laser Therapy.  This treatment is performed by applying a specialized laser to the affected nail plate.  This therapy is noninvasive, fast, and non-painful.  It is not covered by insurance and is therefore, out of pocket.  The results have been very good with a 80-95% cure rate.  [facility] is the only practice in the area to offer this therapy. 5. Permanent Nail Avulsion.  Removing the entire nail so that a new nail will not grow back.

## 2019-04-03 NOTE — Progress Notes (Signed)
[name] is a 78 y.o. female who presents for annual wellness visit and follow-up on chronic medical conditions.  She has the following concerns:  Denies any new concerns today. Reports doing well. Feeling at baseline.  She is here alone but her daughter drove her and is in the parking lot.   Followed by her pulmonologist. using oxygen at night only. States she is sleeping well.   Blood sugars have been in a good range. No sign of low or significantly elevated readings.  DM- 6.5% in November 2019  HTN- BP readings have been slightly above goal some days.     Immunization History  Administered Date(s) Administered  . Fluad Quad(high Dose 65+) 04/04/2019  . Influenza, High Dose Seasonal PF 08/04/2016, 05/02/2017, 04/06/2018  . Influenza-Unspecified 05/23/2013, 08/25/2013, 06/22/2014, 06/22/2014, 05/22/2015  . Pneumococcal Conjugate-13 06/22/2014  . Pneumococcal-Unspecified 01/12/2008  . Tdap 01/12/2008  . Zoster 07/21/2011, 12/08/2011   Last Pap smear: 2019 Last mammogram: scheduled next week 04/11/19 Last colonoscopy:03/2018 Last DEXA: 2011 Dentist: Dr. Armanda [name]. Teeth pulled. Implants ordered.  Ophtho: Dr. Midge [name]. New eyeglasses and exam 2 months ago  Exercise: everyday walking  Other doctors caring for patient include: Dr. Elisha [name]- podiatry Dr. Sallyanne [name]- Cardiology Dr. Halford [name]- Pulmonology Dr. Havery [name]- GI ErinBhandari- Kidney  Depression screen:  See questionnaire below.  Depression screen [facility] 2/9 04/04/2019 01/27/2018 09/01/2017  Decreased Interest 0 0 0  Down, Depressed, Hopeless 0 0 0  PHQ - 2 Score 0 0 0    Fall Risk Screen: see questionnaire below. Fall Risk  04/04/2019 01/27/2018 09/01/2017  Falls in the past year? 0 No No  Number falls in past yr: 0 - -  Injury with Fall? 0 - -    ADL screen:  See questionnaire below Functional Status Survey: Is the patient deaf or have difficulty hearing?: No Does the patient have difficulty seeing, even  when wearing glasses/contacts?: No Does the patient have difficulty concentrating, remembering, or making decisions?: No Does the patient have difficulty walking or climbing stairs?: No Does the patient have difficulty dressing or bathing?: No Does the patient have difficulty doing errands alone such as visiting a doctor's office or shopping?: No   End of Life Discussion:  Patient has a living will and medical power of attorney. Daughter [name] is her HCPOA. Both [name] and [name] are on her HIPAA forms.   Review of Systems Constitutional: -fever, -chills, -sweats, -unexpected weight change, -anorexia, -fatigue Allergy: -sneezing, -itching, -congestion Dermatology: denies changing moles, rash, lumps, new worrisome lesions ENT: -runny nose, -ear pain, -sore throat, -hoarseness, -sinus pain, -teeth pain, -tinnitus, -hearing loss, -epistaxis Cardiology:  -chest pain, -palpitations, -edema, -orthopnea, -paroxysmal nocturnal dyspnea Respiratory: -cough, -shortness of breath, -dyspnea on exertion, -wheezing, -hemoptysis Gastroenterology: -abdominal pain, -nausea, -vomiting, -diarrhea, -constipation, -blood in stool, -changes in bowel movement, -dysphagia Hematology: -bleeding or bruising problems Musculoskeletal: -arthralgias, -myalgias, -joint swelling, -back pain, -neck pain, -cramping, -gait changes Ophthalmology: -vision changes, -eye redness, -itching, -discharge Urology: -dysuria, -difficulty urinating, -hematuria, -urinary frequency, -urgency, incontinence Neurology: -headache, -weakness, -tingling, -numbness, -speech abnormality, -memory loss, -falls, -dizziness Psychology:  -depressed mood, -agitation, -sleep problems    PHYSICAL EXAM:  BP 130/70   Pulse 63   Temp (!) 97.3 F (36.3 C)   Ht 5' 2.5" (1.588 m)   Wt 171 lb 9.6 oz (77.8 kg)   SpO2 96%   BMI 30.89 kg/m   General Appearance: Alert, cooperative, no distress, appears stated age Head: Normocephalic, without  obvious abnormality, atraumatic Eyes:  PERRL, conjunctiva/corneas clear, EOM's intact, fundi benign Ears: Normal TM's and external ear canals Nose: mask in place  Throat: mask in place  Neck: Supple, no lymphadenopathy; thyroid: no enlargement/tenderness/nodules Back: Spine nontender, no curvature, ROM normal, no CVA tenderness Lungs: Clear to auscultation bilaterally without wheezes, rales or ronchi; respirations unlabored Chest Wall: No tenderness or deformity Heart: Regular rate and rhythm, S1 and S2 normal, no murmur, rub or gallop Breast Exam:  Declines  Abdomen: Soft, non-tender, nondistended, normoactive bowel sounds, no masses, no hepatosplenomegaly Genitalia: declines  Extremities: No clubbing, cyanosis or edema Pulses: 2+ and symmetric all extremities Skin: Skin color, texture, turgor normal, no rashes or lesions Lymph nodes: Cervical, supraclavicular, and axillary nodes normal Neurologic: CNII-XII intact, normal strength, sensation and gait; reflexes 2+ and symmetric throughout Psych: Normal mood, affect, hygiene and grooming.  ASSESSMENT/PLAN: Medicare annual wellness visit, subsequent -here today for AWV. No falls, depression, memory concerns. Advance directive counseling done.    Routine general medical examination at a health care facility - Plan: CBC with Differential/Platelet, Comprehensive metabolic panel, TSH, T4, free, Lipid panel -discussed preventive health care, safety and health promotion. Counseling on healthy diet and activity level.   Iron deficiency anemia, unspecified iron deficiency anemia type - Plan: CBC with Differential/Platelet -appears asymptomatic. continue iron and check labs  Type 2 diabetes, controlled, with peripheral neuropathy ([place]) - Plan: Microalbumin / creatinine urine ratio, Hemoglobin A1c -readings at home without evidence of hypoglycemia or hyperglycemia Diabetic eye exam up to date, will request results.  Sees podiatrist every 3  months.   Essential hypertension- BP in goal range today. She has elevated readings from home. Continue to monitor. Follow up with cardiology.   Advance directive discussed with patient- reviewed and signed MOST form. She has advance directives at home.   Immunization counseling- prescription given to patient for Tdap and Shingrix. She will check on affordability of Shingrix.   Needs flu shot - Plan: Flu Vaccine QUAD High Dose(Fluad)  Hypercholesterolemia - Plan: Lipid panel. Continue statin and follow up pending lipid results   Osteopenia, unspecified location       Discussed monthly self breast exams and yearly mammograms; at least 30 minutes of aerobic activity at least 5 days/week and weight-bearing exercise 2x/week; proper sunscreen use reviewed; healthy diet, including goals of calcium and vitamin D intake and alcohol recommendations (less than or equal to 1 drink/day) reviewed; regular seatbelt use; changing batteries in smoke detectors.  Immunization recommendations discussed.  Colonoscopy recommendations reviewed   Medicare Attestation I have personally reviewed: The patient's medical and social history Their use of alcohol, tobacco or illicit drugs Their current medications and supplements The patient's functional ability including ADLs,fall risks, home safety risks, cognitive, and hearing and visual impairment Diet and physical activities Evidence for depression or mood disorders  The patient's weight, height, and BMI have been recorded in the chart.  I have made referrals, counseling, and provided education to the patient based on review of the above and I have provided the patient with a written personalized care plan for preventive services.     Harland Dingwall, NP-C   04/04/2019

## 2019-04-04 ENCOUNTER — Other Ambulatory Visit: Payer: Self-pay

## 2019-04-04 ENCOUNTER — Encounter: Payer: Self-pay | Admitting: Family Medicine

## 2019-04-04 ENCOUNTER — Ambulatory Visit (INDEPENDENT_AMBULATORY_CARE_PROVIDER_SITE_OTHER): Payer: Medicare PPO | Admitting: Family Medicine

## 2019-04-04 VITALS — BP 130/70 | HR 63 | Temp 97.3°F | Ht 62.5 in | Wt 171.6 lb

## 2019-04-04 DIAGNOSIS — M8580 Other specified disorders of bone density and structure, unspecified site: Secondary | ICD-10-CM

## 2019-04-04 DIAGNOSIS — Z0000 Encounter for general adult medical examination without abnormal findings: Secondary | ICD-10-CM

## 2019-04-04 DIAGNOSIS — D509 Iron deficiency anemia, unspecified: Secondary | ICD-10-CM

## 2019-04-04 DIAGNOSIS — Z23 Encounter for immunization: Secondary | ICD-10-CM | POA: Diagnosis not present

## 2019-04-04 DIAGNOSIS — I10 Essential (primary) hypertension: Secondary | ICD-10-CM

## 2019-04-04 DIAGNOSIS — E7800 Pure hypercholesterolemia, unspecified: Secondary | ICD-10-CM

## 2019-04-04 DIAGNOSIS — Z7185 Encounter for immunization safety counseling: Secondary | ICD-10-CM

## 2019-04-04 DIAGNOSIS — E1142 Type 2 diabetes mellitus with diabetic polyneuropathy: Secondary | ICD-10-CM | POA: Diagnosis not present

## 2019-04-04 DIAGNOSIS — Z7189 Other specified counseling: Secondary | ICD-10-CM

## 2019-04-04 NOTE — Patient Instructions (Addendum)
[name] , Thank you for taking time to come for your Medicare Wellness Visit. I appreciate your ongoing commitment to your health goals. Please review the following plan we discussed and let me know if I can assist you in the future.   These are the goals we discussed:  You may go to your pharmacy and take the prescription I gave you today to get your Tdap (Tetanus, diptheria and pertussis) vaccine if you would like. You may also get the new shingles vaccine, called Shingrix. This is a 2 shot series. Check with your insurance carrier about the affordability of this before getting it.   Check with your pulmonologist regarding whether you should update your pneumonia vaccine.   You received the high dose flu shot today.   We are checking your urine for protein today.   I will request your diabetic eye exam from Dr. Katy Fitch to update your record.   Make sure your blood pressures at home are staying close to or under 130/80. If not, let your cardiologist know about this.   Continue to follow up with your specialists as recommended.   We will contact you with your lab results from today.      This is a list of the screening recommended for you and due dates:  Health Maintenance  Topic Date Due  . Eye exam for diabetics  07/22/1950  . Urine Protein Check  07/22/1950  . Tetanus Vaccine  01/11/2018  . Hemoglobin A1C  12/01/2018  . Complete foot exam   01/28/2019  . Flu Shot  02/18/2019  . DEXA scan (bone density measurement)  Completed  . Pneumonia vaccines  Completed    Preventive Care 28 Years and Older, Female Preventive care refers to lifestyle choices and visits with your health care provider that can promote health and wellness. This includes:  A yearly physical exam. This is also called an annual well check.  Regular dental and eye exams.  Immunizations.  Screening for certain conditions.  Healthy lifestyle choices, such as diet and exercise. What can I expect for  my preventive care visit? Physical exam Your health care provider will check:  Height and weight. These may be used to calculate body mass index (BMI), which is a measurement that tells if you are at a healthy weight.  Heart rate and blood pressure.  Your skin for abnormal spots. Counseling Your health care provider may ask you questions about:  Alcohol, tobacco, and drug use.  Emotional well-being.  Home and relationship well-being.  Sexual activity.  Eating habits.  History of falls.  Memory and ability to understand (cognition).  Work and work Statistician.  Pregnancy and menstrual history. What immunizations do I need?  Influenza (flu) vaccine  This is recommended every year. Tetanus, diphtheria, and pertussis (Tdap) vaccine  You may need a Td booster every 10 years. Varicella (chickenpox) vaccine  You may need this vaccine if you have not already been vaccinated. Zoster (shingles) vaccine  You may need this after age 43. Pneumococcal conjugate (PCV13) vaccine  One dose is recommended after age 90. Pneumococcal polysaccharide (PPSV23) vaccine  One dose is recommended after age 44. Measles, mumps, and rubella (MMR) vaccine  You may need at least one dose of MMR if you were born in 1957 or later. You may also need a second dose. Meningococcal conjugate (MenACWY) vaccine  You may need this if you have certain conditions. Hepatitis A vaccine  You may need this if you have certain conditions  or if you travel or work in places where you may be exposed to hepatitis A. Hepatitis B vaccine  You may need this if you have certain conditions or if you travel or work in places where you may be exposed to hepatitis B. Haemophilus influenzae type b (Hib) vaccine  You may need this if you have certain conditions. You may receive vaccines as individual doses or as more than one vaccine together in one shot (combination vaccines). Talk with your health care provider  about the risks and benefits of combination vaccines. What tests do I need? Blood tests  Lipid and cholesterol levels. These may be checked every 5 years, or more frequently depending on your overall health.  Hepatitis C test.  Hepatitis B test. Screening  Lung cancer screening. You may have this screening every year starting at age 69 if you have a 30-pack-year history of smoking and currently smoke or have quit within the past 15 years.  Colorectal cancer screening. All adults should have this screening starting at age 5 and continuing until age 39. Your health care provider may recommend screening at age 20 if you are at increased risk. You will have tests every 1-10 years, depending on your results and the type of screening test.  Diabetes screening. This is done by checking your blood sugar (glucose) after you have not eaten for a while (fasting). You may have this done every 1-3 years.  Mammogram. This may be done every 1-2 years. Talk with your health care provider about how often you should have regular mammograms.  BRCA-related cancer screening. This may be done if you have a family history of breast, ovarian, tubal, or peritoneal cancers. Other tests  Sexually transmitted disease (STD) testing.  Bone density scan. This is done to screen for osteoporosis. You may have this done starting at age 48. Follow these instructions at home: Eating and drinking  Eat a diet that includes fresh fruits and vegetables, whole grains, lean protein, and low-fat dairy products. Limit your intake of foods with high amounts of sugar, saturated fats, and salt.  Take vitamin and mineral supplements as recommended by your health care provider.  Do not drink alcohol if your health care provider tells you not to drink.  If you drink alcohol: ? Limit how much you have to 0-1 drink a day. ? Be aware of how much alcohol is in your drink. In the [place] one drink equals one 12 oz bottle of beer (355  mL), one 5 oz glass of wine (148 mL), or one 1 oz glass of hard liquor (44 mL). Lifestyle  Take daily care of your teeth and gums.  Stay active. Exercise for at least 30 minutes on 5 or more days each week.  Do not use any products that contain nicotine or tobacco, such as cigarettes, e-cigarettes, and chewing tobacco. If you need help quitting, ask your health care provider.  If you are sexually active, practice safe sex. Use a condom or other form of protection in order to prevent STIs (sexually transmitted infections).  Talk with your health care provider about taking a low-dose aspirin or statin. What's next?  Go to your health care provider once a year for a well check visit.  Ask your health care provider how often you should have your eyes and teeth checked.  Stay up to date on all vaccines. This information is not intended to replace advice given to you by your health care provider. Make sure you discuss  any questions you have with your health care provider. Document Released: 08/02/2015 Document Revised: 06/30/2018 Document Reviewed: 06/30/2018 Elsevier Patient Education  2020 [employer].

## 2019-04-06 LAB — CBC WITH DIFFERENTIAL/PLATELET
Basophils Absolute: 0 10*3/uL (ref 0.0–0.2)
Basos: 1 %
EOS (ABSOLUTE): 0.1 10*3/uL (ref 0.0–0.4)
Eos: 3 %
Hematocrit: 38.7 % (ref 34.0–46.6)
Hemoglobin: 12.4 g/dL (ref 11.1–15.9)
Immature Grans (Abs): 0 10*3/uL (ref 0.0–0.1)
Immature Granulocytes: 0 %
Lymphocytes Absolute: 0.9 10*3/uL (ref 0.7–3.1)
Lymphs: 25 %
MCH: 27.6 pg (ref 26.6–33.0)
MCHC: 32 g/dL (ref 31.5–35.7)
MCV: 86 fL (ref 79–97)
Monocytes Absolute: 0.3 10*3/uL (ref 0.1–0.9)
Monocytes: 10 %
Neutrophils Absolute: 2.1 10*3/uL (ref 1.4–7.0)
Neutrophils: 61 %
Platelets: 227 10*3/uL (ref 150–450)
RBC: 4.49 x10E6/uL (ref 3.77–5.28)
RDW: 13.1 % (ref 11.7–15.4)
WBC: 3.5 10*3/uL (ref 3.4–10.8)

## 2019-04-06 LAB — TSH: TSH: 2.02 u[IU]/mL (ref 0.450–4.500)

## 2019-04-06 LAB — LIPID PANEL
Chol/HDL Ratio: 1.5 ratio (ref 0.0–4.4)
Cholesterol, Total: 125 mg/dL (ref 100–199)
HDL: 83 mg/dL (ref >39)
LDL Chol Calc (NIH): 30 mg/dL (ref 0–99)
Triglycerides: 51 mg/dL (ref 0–149)
VLDL Cholesterol Cal: 12 mg/dL (ref 5–40)

## 2019-04-06 LAB — COMPREHENSIVE METABOLIC PANEL
ALT: 11 IU/L (ref 0–32)
AST: 19 IU/L (ref 0–40)
Albumin/Globulin Ratio: 1.6 (ref 1.2–2.2)
Albumin: 4.5 g/dL (ref 3.7–4.7)
Alkaline Phosphatase: 101 IU/L (ref 39–117)
BUN/Creatinine Ratio: 11 — ABNORMAL LOW (ref 12–28)
BUN: 18 mg/dL (ref 8–27)
Bilirubin Total: 0.8 mg/dL (ref 0.0–1.2)
CO2: 25 mmol/L (ref 20–29)
Calcium: 9.2 mg/dL (ref 8.7–10.3)
Chloride: 101 mmol/L (ref 96–106)
Creatinine, Ser: 1.65 mg/dL — ABNORMAL HIGH (ref 0.57–1.00)
GFR calc Af Amer: 34 mL/min/{1.73_m2} — ABNORMAL LOW (ref >59)
GFR calc non Af Amer: 29 mL/min/{1.73_m2} — ABNORMAL LOW (ref >59)
Globulin, Total: 2.8 g/dL (ref 1.5–4.5)
Glucose: 141 mg/dL — ABNORMAL HIGH (ref 65–99)
Potassium: 3.7 mmol/L (ref 3.5–5.2)
Sodium: 141 mmol/L (ref 134–144)
Total Protein: 7.3 g/dL (ref 6.0–8.5)

## 2019-04-06 LAB — T4, FREE: Free T4: 1.8 ng/dL — ABNORMAL HIGH (ref 0.82–1.77)

## 2019-04-06 LAB — HEMOGLOBIN A1C
Est. average glucose Bld gHb Est-mCnc: 120 mg/dL
Hgb A1c MFr Bld: 5.8 % — ABNORMAL HIGH (ref 4.8–5.6)

## 2019-04-06 LAB — MICROALBUMIN / CREATININE URINE RATIO
Creatinine, Urine: 189.7 mg/dL
Microalb/Creat Ratio: 51 mg/g creat — ABNORMAL HIGH (ref 0–29)
Microalbumin, Urine: 96.7 ug/mL

## 2019-04-07 ENCOUNTER — Telehealth: Payer: Self-pay | Admitting: Family Medicine

## 2019-04-07 NOTE — Telephone Encounter (Signed)
Requested records received from [facility]

## 2019-04-11 ENCOUNTER — Encounter: Payer: Self-pay | Admitting: Internal Medicine

## 2019-04-11 ENCOUNTER — Ambulatory Visit: Payer: Medicare PPO

## 2019-04-18 ENCOUNTER — Other Ambulatory Visit: Payer: Self-pay | Admitting: Family Medicine

## 2019-05-10 ENCOUNTER — Other Ambulatory Visit: Payer: Self-pay | Admitting: Family Medicine

## 2019-05-26 IMAGING — DX DG CHEST 2V
1 series · 1 of 1 positions shown · non-contrast
Comparison: 12/10/2017.

CLINICAL DATA: Cough and chest congestion. Crackles in the left
lung.

EXAM:
CHEST - 2 VIEW

[dg chest 2 view]
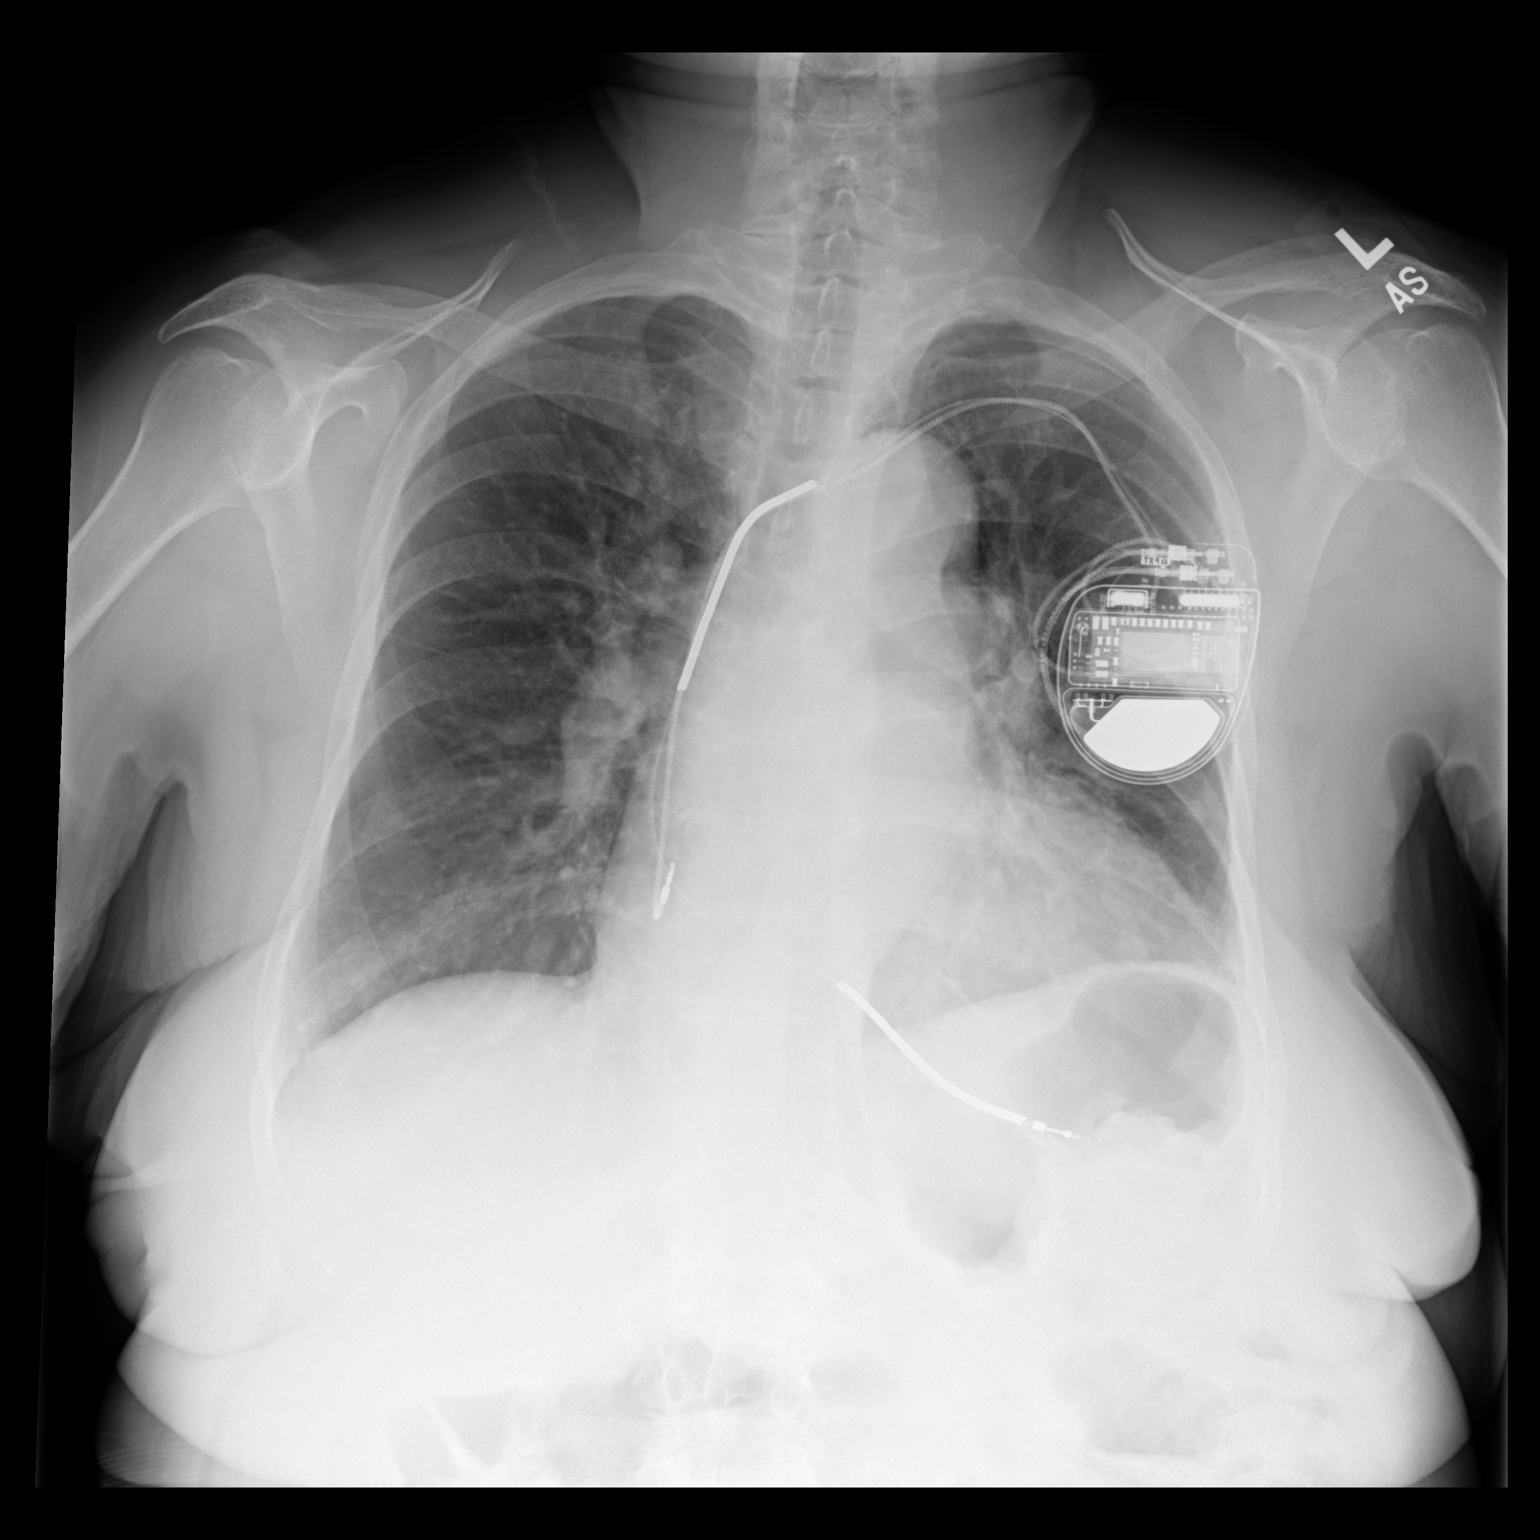

[1 of 1 positions shown; findings below may reference images not displayed]

FINDINGS: Enlarged cardiac silhouette with improvement. Stable left subclavian
AICD leads. The aorta remains tortuous and mildly calcified. Clear
lungs. Posterior left diaphragmatic eventration. Mild thoracic and
upper lumbar spine degenerative changes.
IMPRESSION: No acute abnormality.

## 2019-06-07 ENCOUNTER — Ambulatory Visit (INDEPENDENT_AMBULATORY_CARE_PROVIDER_SITE_OTHER): Payer: Medicare PPO | Admitting: *Deleted

## 2019-06-07 DIAGNOSIS — I471 Supraventricular tachycardia: Secondary | ICD-10-CM | POA: Diagnosis not present

## 2019-06-07 DIAGNOSIS — I5042 Chronic combined systolic (congestive) and diastolic (congestive) heart failure: Secondary | ICD-10-CM

## 2019-06-07 LAB — CUP PACEART REMOTE DEVICE CHECK
Battery Remaining Longevity: 40 mo
Battery Voltage: 2.96 V
Brady Statistic AP VP Percent: 0.13 %
Brady Statistic AP VS Percent: 89.67 %
Brady Statistic AS VP Percent: 0.01 %
Brady Statistic AS VS Percent: 10.19 %
Brady Statistic RA Percent Paced: 89.51 %
Brady Statistic RV Percent Paced: 0.17 %
Date Time Interrogation Session: 20201118073324
HighPow Impedance: 41 Ohm
HighPow Impedance: 54 Ohm
Implantable Pulse Generator Implant Date: 20131120
Lead Channel Impedance Value: 323 Ohm
Lead Channel Impedance Value: 380 Ohm
Lead Channel Impedance Value: 437 Ohm
Lead Channel Pacing Threshold Amplitude: 0.625 V
Lead Channel Pacing Threshold Amplitude: 0.75 V
Lead Channel Pacing Threshold Pulse Width: 0.4 ms
Lead Channel Pacing Threshold Pulse Width: 0.4 ms
Lead Channel Sensing Intrinsic Amplitude: 0.75 mV
Lead Channel Sensing Intrinsic Amplitude: 0.75 mV
Lead Channel Sensing Intrinsic Amplitude: 6.125 mV
Lead Channel Sensing Intrinsic Amplitude: 6.125 mV
Lead Channel Setting Pacing Amplitude: 1.5 V
Lead Channel Setting Pacing Amplitude: 2 V
Lead Channel Setting Pacing Pulse Width: 0.4 ms
Lead Channel Setting Sensing Sensitivity: 0.3 mV

## 2019-06-26 ENCOUNTER — Other Ambulatory Visit: Payer: Self-pay | Admitting: Family Medicine

## 2019-07-03 ENCOUNTER — Other Ambulatory Visit: Payer: Self-pay

## 2019-07-03 MED ORDER — ATORVASTATIN CALCIUM 40 MG PO TABS: 40.0000 mg | ORAL_TABLET | Freq: Every day | ORAL | 1 refills | Status: DC

## 2019-07-06 NOTE — Progress Notes (Signed)
Remote ICD transmission.

## 2019-07-07 ENCOUNTER — Encounter: Payer: Self-pay | Admitting: Podiatry

## 2019-07-07 ENCOUNTER — Other Ambulatory Visit: Payer: Self-pay

## 2019-07-07 ENCOUNTER — Ambulatory Visit: Payer: Medicare PPO | Admitting: Podiatry

## 2019-07-07 DIAGNOSIS — E1142 Type 2 diabetes mellitus with diabetic polyneuropathy: Secondary | ICD-10-CM

## 2019-07-07 DIAGNOSIS — M79674 Pain in right toe(s): Secondary | ICD-10-CM | POA: Diagnosis not present

## 2019-07-07 DIAGNOSIS — L84 Corns and callosities: Secondary | ICD-10-CM

## 2019-07-07 DIAGNOSIS — M79675 Pain in left toe(s): Secondary | ICD-10-CM

## 2019-07-07 DIAGNOSIS — B351 Tinea unguium: Secondary | ICD-10-CM | POA: Diagnosis not present

## 2019-07-07 NOTE — Progress Notes (Signed)
Subjective: [name] presents to clinic for preventative diabetic foot care with cc of painful mycotic toenails and calluses b/l feet which are aggravated when weightbearing with and without shoe gear.  This pain limits her daily activities. Pain symptoms resolve with periodic professional debridement.  Henson, Vickie L, NP-C is her PCP. She was evaluated via telephone visit 12/28/2018.  Medications reviewed in chart.  Allergies  Allergen Reactions  . Benazepril Hcl   . Lotrel [Amlodipine Besy-Benazepril Hcl]   . Talwin [Pentazocine]     Hallulate     Objective: There were no vitals filed for this visit.  Physical Examination:  Vascular  Examination: Capillary refill time to digits <3 seconds b/l.   Palpable DP pulses b/l.  Nonpalpable PT pulses b/l.  Digital hair present b/l.  Skin temperature gradient WNL b/l.  Dermatological Examination: Skin with normal turgor, texture and tone b/l.  No open wounds b/l.  Elongated, thick, discolored brittle toenails with subungual debris and pain on dorsal palpation of nailbeds 1-5 b/l.  Hyperkeratotic lesion submet head 5 b/l and right hallux. No edema, no erythema, no drainage, no flocculence.   Musculoskeletal Examination: Muscle strength 5/5 to all muscle groups b/l.  No pain, crepitus or joint discomfort with active/passive ROM.  Neurological Examination: Protective sensation diminished b/l.  Vibratory sensation diminished b/l.  Assessment: 1. Mycotic nail infection with pain 1-5 b/l 2. Calluses submet head 5 b/l and right hallux 3. NIDDM with neuropathy  Plan: 1. Continue diabetic foot care principles daily. Literature dispensed.  2. Toenails 1-5 b/l were debrided in length and girth without iatrogenic laceration. Calluses pared submetatarsal head 5 b/l and right hallux utilizing sterile scalpel blade without incident. Continue soft, supportive shoe gear daily. Report any pedal injuries to medical  professional. Follow up 3 months. Patient/POA to call should there be a question/concern in there interim.

## 2019-07-07 NOTE — Patient Instructions (Signed)
Diabetes Mellitus and Foot Care Foot care is an important part of your health, especially when you have diabetes. Diabetes may cause you to have problems because of poor blood flow (circulation) to your feet and legs, which can cause your skin to:  Become thinner and drier.  Break more easily.  Heal more slowly.  Peel and crack. You may also have nerve damage (neuropathy) in your legs and feet, causing decreased feeling in them. This means that you may not notice minor injuries to your feet that could lead to more serious problems. Noticing and addressing any potential problems early is the best way to prevent future foot problems. How to care for your feet Foot hygiene  Wash your feet daily with warm water and mild soap. Do not use hot water. Then, pat your feet and the areas between your toes until they are completely dry. Do not soak your feet as this can dry your skin.  Trim your toenails straight across. Do not dig under them or around the cuticle. File the edges of your nails with an emery board or nail file.  Apply a moisturizing lotion or petroleum jelly to the skin on your feet and to dry, brittle toenails. Use lotion that does not contain alcohol and is unscented. Do not apply lotion between your toes. Shoes and socks  Wear clean socks or stockings every day. Make sure they are not too tight. Do not wear knee-high stockings since they may decrease blood flow to your legs.  Wear shoes that fit properly and have enough cushioning. Always look in your shoes before you put them on to be sure there are no objects inside.  To break in new shoes, wear them for just a few hours a day. This prevents injuries on your feet. Wounds, scrapes, corns, and calluses  Check your feet daily for blisters, cuts, bruises, sores, and redness. If you cannot see the bottom of your feet, use a mirror or ask someone for help.  Do not cut corns or calluses or try to remove them with medicine.  If you  find a minor scrape, cut, or break in the skin on your feet, keep it and the skin around it clean and dry. You may clean these areas with mild soap and water. Do not clean the area with peroxide, alcohol, or iodine.  If you have a wound, scrape, corn, or callus on your foot, look at it several times a day to make sure it is healing and not infected. Check for: ? Redness, swelling, or pain. ? Fluid or blood. ? Warmth. ? Pus or a bad smell. General instructions  Do not cross your legs. This may decrease blood flow to your feet.  Do not use heating pads or hot water bottles on your feet. They may burn your skin. If you have lost feeling in your feet or legs, you may not know this is happening until it is too late.  Protect your feet from hot and cold by wearing shoes, such as at the beach or on hot pavement.  Schedule a complete foot exam at least once a year (annually) or more often if you have foot problems. If you have foot problems, report any cuts, sores, or bruises to your health care provider immediately. Contact a health care provider if:  You have a medical condition that increases your risk of infection and you have any cuts, sores, or bruises on your feet.  You have an injury that is not   healing.  You have redness on your legs or feet.  You feel burning or tingling in your legs or feet.  You have pain or cramps in your legs and feet.  Your legs or feet are numb.  Your feet always feel cold.  You have pain around a toenail. Get help right away if:  You have a wound, scrape, corn, or callus on your foot and: ? You have pain, swelling, or redness that gets worse. ? You have fluid or blood coming from the wound, scrape, corn, or callus. ? Your wound, scrape, corn, or callus feels warm to the touch. ? You have pus or a bad smell coming from the wound, scrape, corn, or callus. ? You have a fever. ? You have a red line going up your leg. Summary  Check your feet every day  for cuts, sores, red spots, swelling, and blisters.  Moisturize feet and legs daily.  Wear shoes that fit properly and have enough cushioning.  If you have foot problems, report any cuts, sores, or bruises to your health care provider immediately.  Schedule a complete foot exam at least once a year (annually) or more often if you have foot problems. This information is not intended to replace advice given to you by your health care provider. Make sure you discuss any questions you have with your health care provider. Document Released: 07/03/2000 Document Revised: 08/18/2017 Document Reviewed: 08/07/2016 Elsevier Patient Education  2020 [employer].

## 2019-07-08 ENCOUNTER — Other Ambulatory Visit: Payer: Self-pay | Admitting: Family Medicine

## 2019-07-10 NOTE — Telephone Encounter (Signed)
Is this okay to refill>? 

## 2019-07-10 NOTE — Telephone Encounter (Signed)
Ok to refill.

## 2019-07-27 ENCOUNTER — Other Ambulatory Visit: Payer: Self-pay | Admitting: Family Medicine

## 2019-07-31 ENCOUNTER — Other Ambulatory Visit: Payer: Self-pay | Admitting: Internal Medicine

## 2019-07-31 MED ORDER — MIRABEGRON ER 50 MG PO TB24: ORAL_TABLET | ORAL | 2 refills | Status: DC

## 2019-09-06 ENCOUNTER — Telehealth: Payer: Self-pay

## 2019-09-06 ENCOUNTER — Ambulatory Visit (INDEPENDENT_AMBULATORY_CARE_PROVIDER_SITE_OTHER): Payer: Medicare PPO | Admitting: *Deleted

## 2019-09-06 DIAGNOSIS — I471 Supraventricular tachycardia: Secondary | ICD-10-CM | POA: Diagnosis not present

## 2019-09-06 LAB — CUP PACEART REMOTE DEVICE CHECK
Battery Remaining Longevity: 38 mo
Battery Voltage: 2.96 V
Brady Statistic AP VP Percent: 0.68 %
Brady Statistic AP VS Percent: 82.77 %
Brady Statistic AS VP Percent: 0.06 %
Brady Statistic AS VS Percent: 16.49 %
Brady Statistic RA Percent Paced: 82.41 %
Brady Statistic RV Percent Paced: 0.86 %
Date Time Interrogation Session: 20210217033623
HighPow Impedance: 37 Ohm
HighPow Impedance: 49 Ohm
Implantable Pulse Generator Implant Date: 20131120
Lead Channel Impedance Value: 323 Ohm
Lead Channel Impedance Value: 323 Ohm
Lead Channel Impedance Value: 380 Ohm
Lead Channel Pacing Threshold Amplitude: 0.5 V
Lead Channel Pacing Threshold Amplitude: 0.75 V
Lead Channel Pacing Threshold Pulse Width: 0.4 ms
Lead Channel Pacing Threshold Pulse Width: 0.4 ms
Lead Channel Sensing Intrinsic Amplitude: 0.5 mV
Lead Channel Sensing Intrinsic Amplitude: 0.5 mV
Lead Channel Sensing Intrinsic Amplitude: 5.75 mV
Lead Channel Sensing Intrinsic Amplitude: 5.75 mV
Lead Channel Setting Pacing Amplitude: 1.5 V
Lead Channel Setting Pacing Amplitude: 2 V
Lead Channel Setting Pacing Pulse Width: 0.4 ms
Lead Channel Setting Sensing Sensitivity: 0.3 mV

## 2019-09-06 NOTE — Telephone Encounter (Signed)
Thanks, will discuss at office visit

## 2019-09-06 NOTE — Progress Notes (Signed)
ICD Remote  

## 2019-09-06 NOTE — Telephone Encounter (Signed)
Scheduled ICD transmission received- Noted in creased in AT/ AF events.  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 Not on [place].  Pt meds currently include Carvedilol 54m BID.    Current report shows 171AT/ AF/ AFl episodes since 06/09/19.    Pt scheduled for OV on 09/25/19.

## 2019-09-13 ENCOUNTER — Other Ambulatory Visit: Payer: Self-pay | Admitting: Family Medicine

## 2019-09-18 ENCOUNTER — Other Ambulatory Visit: Payer: Self-pay | Admitting: Family Medicine

## 2019-09-22 ENCOUNTER — Encounter: Payer: Self-pay | Admitting: Podiatry

## 2019-09-22 ENCOUNTER — Ambulatory Visit: Payer: Medicare PPO | Admitting: Podiatry

## 2019-09-22 ENCOUNTER — Other Ambulatory Visit: Payer: Self-pay

## 2019-09-22 VITALS — Temp 96.4°F

## 2019-09-22 DIAGNOSIS — M79675 Pain in left toe(s): Secondary | ICD-10-CM | POA: Diagnosis not present

## 2019-09-22 DIAGNOSIS — E1142 Type 2 diabetes mellitus with diabetic polyneuropathy: Secondary | ICD-10-CM

## 2019-09-22 DIAGNOSIS — L84 Corns and callosities: Secondary | ICD-10-CM | POA: Diagnosis not present

## 2019-09-22 DIAGNOSIS — B351 Tinea unguium: Secondary | ICD-10-CM | POA: Diagnosis not present

## 2019-09-22 DIAGNOSIS — M79674 Pain in right toe(s): Secondary | ICD-10-CM

## 2019-09-22 NOTE — Patient Instructions (Signed)
Ms. [name],  You need to have an M.D. or D.O. in your PCP's office certify you for your diabetic shoes. Please discuss with PCP on your next visit.  Moisturize feet once daily; do not apply between toes: Vaseline Intensive Care Lotion Lubriderm Lotion Gold Bond Diabetic Foot Lotion Eucerin Intensive Repair Moisturizing Lotion  If you have problems reaching your feet:  Aquaphor Advanced Therapy Ointment Body Spray Vaseline Intensive Care Spray Lotion Advanced Repair   Diabetes Mellitus and Foot Care Foot care is an important part of your health, especially when you have diabetes. Diabetes may cause you to have problems because of poor blood flow (circulation) to your feet and legs, which can cause your skin to:  Become thinner and drier.  Break more easily.  Heal more slowly.  Peel and crack. You may also have nerve damage (neuropathy) in your legs and feet, causing decreased feeling in them. This means that you may not notice minor injuries to your feet that could lead to more serious problems. Noticing and addressing any potential problems early is the best way to prevent future foot problems. How to care for your feet Foot hygiene  Wash your feet daily with warm water and mild soap. Do not use hot water. Then, pat your feet and the areas between your toes until they are completely dry. Do not soak your feet as this can dry your skin.  Trim your toenails straight across. Do not dig under them or around the cuticle. File the edges of your nails with an emery board or nail file.  Apply a moisturizing lotion or petroleum jelly to the skin on your feet and to dry, brittle toenails. Use lotion that does not contain alcohol and is unscented. Do not apply lotion between your toes. Shoes and socks  Wear clean socks or stockings every day. Make sure they are not too tight. Do not wear knee-high stockings since they may decrease blood flow to your legs.  Wear shoes that fit properly and  have enough cushioning. Always look in your shoes before you put them on to be sure there are no objects inside.  To break in new shoes, wear them for just a few hours a day. This prevents injuries on your feet. Wounds, scrapes, corns, and calluses  Check your feet daily for blisters, cuts, bruises, sores, and redness. If you cannot see the bottom of your feet, use a mirror or ask someone for help.  Do not cut corns or calluses or try to remove them with medicine.  If you find a minor scrape, cut, or break in the skin on your feet, keep it and the skin around it clean and dry. You may clean these areas with mild soap and water. Do not clean the area with peroxide, alcohol, or iodine.  If you have a wound, scrape, corn, or callus on your foot, look at it several times a day to make sure it is healing and not infected. Check for: ? Redness, swelling, or pain. ? Fluid or blood. ? Warmth. ? Pus or a bad smell. General instructions  Do not cross your legs. This may decrease blood flow to your feet.  Do not use heating pads or hot water bottles on your feet. They may burn your skin. If you have lost feeling in your feet or legs, you may not know this is happening until it is too late.  Protect your feet from hot and cold by wearing shoes, such as at the  beach or on hot pavement.  Schedule a complete foot exam at least once a year (annually) or more often if you have foot problems. If you have foot problems, report any cuts, sores, or bruises to your health care provider immediately. Contact a health care provider if:  You have a medical condition that increases your risk of infection and you have any cuts, sores, or bruises on your feet.  You have an injury that is not healing.  You have redness on your legs or feet.  You feel burning or tingling in your legs or feet.  You have pain or cramps in your legs and feet.  Your legs or feet are numb.  Your feet always feel cold.  You have  pain around a toenail. Get help right away if:  You have a wound, scrape, corn, or callus on your foot and: ? You have pain, swelling, or redness that gets worse. ? You have fluid or blood coming from the wound, scrape, corn, or callus. ? Your wound, scrape, corn, or callus feels warm to the touch. ? You have pus or a bad smell coming from the wound, scrape, corn, or callus. ? You have a fever. ? You have a red line going up your leg. Summary  Check your feet every day for cuts, sores, red spots, swelling, and blisters.  Moisturize feet and legs daily.  Wear shoes that fit properly and have enough cushioning.  If you have foot problems, report any cuts, sores, or bruises to your health care provider immediately.  Schedule a complete foot exam at least once a year (annually) or more often if you have foot problems. This information is not intended to replace advice given to you by your health care provider. Make sure you discuss any questions you have with your health care provider. Document Revised: 03/29/2019 Document Reviewed: 08/07/2016 Elsevier Patient Education  [place] are small areas of thickened skin that occur on the top, sides, or tip of a toe. They contain a cone-shaped core with a point that can press on a nerve below. This causes pain.  Calluses are areas of thickened skin that can occur anywhere on the body, including the hands, fingers, palms, soles of the feet, and heels. Calluses are usually larger than corns. What are the causes? Corns and calluses are caused by rubbing (friction) or pressure, such as from shoes that are too tight or do not fit properly. What increases the risk? Corns are more likely to develop in people who have misshapen toes (toe deformities), such as hammer toes. Calluses can occur with friction to any area of the skin. They are more likely to develop in people who:  Work with their hands.  Wear shoes  that fit poorly, are too tight, or are high-heeled.  Have toe deformities. What are the signs or symptoms? Symptoms of a corn or callus include:  A hard growth on the skin.  Pain or tenderness under the skin.  Redness and swelling.  Increased discomfort while wearing tight-fitting shoes, if your feet are affected. If a corn or callus becomes infected, symptoms may include:  Redness and swelling that gets worse.  Pain.  Fluid, blood, or pus draining from the corn or callus. How is this diagnosed? Corns and calluses may be diagnosed based on your symptoms, your medical history, and a physical exam. How is this treated? Treatment for corns and calluses may include:  Removing the cause  of the friction or pressure. This may involve: ? Changing your shoes. ? Wearing shoe inserts (orthotics) or other protective layers in your shoes, such as a corn pad. ? Wearing gloves.  Applying medicine to the skin (topical medicine) to help soften skin in the hardened, thickened areas.  Removing layers of dead skin with a file to reduce the size of the corn or callus.  Removing the corn or callus with a scalpel or laser.  Taking antibiotic medicines, if your corn or callus is infected.  Having surgery, if a toe deformity is the cause. Follow these instructions at home:   Take over-the-counter and prescription medicines only as told by your health care provider.  If you were prescribed an antibiotic, take it as told by your health care provider. Do not stop taking it even if your condition starts to improve.  Wear shoes that fit well. Avoid wearing high-heeled shoes and shoes that are too tight or too loose.  Wear any padding, protective layers, gloves, or orthotics as told by your health care provider.  Soak your hands or feet and then use a file or pumice stone to soften your corn or callus. Do this as told by your health care provider.  Check your corn or callus every day for  symptoms of infection. Contact a health care provider if you:  Notice that your symptoms do not improve with treatment.  Have redness or swelling that gets worse.  Notice that your corn or callus becomes painful.  Have fluid, blood, or pus coming from your corn or callus.  Have new symptoms. Summary  Corns are small areas of thickened skin that occur on the top, sides, or tip of a toe.  Calluses are areas of thickened skin that can occur anywhere on the body, including the hands, fingers, palms, and soles of the feet. Calluses are usually larger than corns.  Corns and calluses are caused by rubbing (friction) or pressure, such as from shoes that are too tight or do not fit properly.  Treatment may include wearing any padding, protective layers, gloves, or orthotics as told by your health care provider. This information is not intended to replace advice given to you by your health care provider. Make sure you discuss any questions you have with your health care provider. Document Revised: 10/26/2018 Document Reviewed: 05/19/2017 Elsevier Patient Education  2020 [employer].

## 2019-09-25 ENCOUNTER — Ambulatory Visit: Payer: Medicare PPO | Admitting: Cardiovascular Disease

## 2019-09-28 NOTE — Progress Notes (Signed)
Subjective: [name] presents today for follow up of preventative diabetic foot care and callus(es) plantar aspect of both feet and painful mycotic toenails b/l that are difficult to trim. Pain interferes with ambulation. Aggravating factors include wearing enclosed shoe gear. Pain is relieved with periodic professional debridement.   She is requesting diabetic shoes on today's visit.  Allergies  Allergen Reactions  . Benazepril Hcl   . Lotrel [Amlodipine Besy-Benazepril Hcl]   . Talwin [Pentazocine]     Hallulate     Objective: Vitals:   09/22/19 1405  Temp: (!) 96.4 F (35.8 C)    Pt 79 y.o. year old female  in NAD. AAO x 3.   Vascular Examination:  Capillary fill time to digits <3 seconds b/l. Palpable DP pulses b/l. Nonpalpable PT pulses b/l. Pedal hair present b/l. Skin temperature gradient within normal limits b/l.  Dermatological Examination: Pedal skin with normal turgor, texture and tone bilaterally. No open wounds bilaterally. No interdigital macerations bilaterally. Toenails 1-5 b/l elongated, dystrophic, thickened, crumbly with subungual debris and tenderness to dorsal palpation. Hyperkeratotic lesion(s) submet heads 1, 5 b/l.  No erythema, no edema, no drainage, no flocculence.  Musculoskeletal: Normal muscle strength 5/5 to all lower extremity muscle groups bilaterally, no pain crepitus or joint limitation noted with ROM b/l and hammertoes noted to the  2-5 bilaterally  Neurological: Protective sensation diminished with 10g monofilament b/l.  Assessment: 1. Pain due to onychomycosis of toenails of both feet   2. Callus   3. Diabetic peripheral neuropathy associated with type 2 diabetes mellitus ([place])    Plan: -Continue diabetic foot care principles. Literature dispensed on today.  -Toenails 1-5 b/l were debrided in length and girth with sterile nail nippers and dremel without iatrogenic bleeding.  -Calluses submet heads 1, 5 b/l were debrided without  complication or incident. Total number debrided =4. -Patient to continue soft, supportive shoe gear daily. Start procedure for diabetic shoes. Patient qualifies based on diagnoses. -Patient to report any pedal injuries to medical professional immediately. -Patient/POA to call should there be question/concern in the interim.  Return in about 3 months (around 12/23/2019).

## 2019-10-03 ENCOUNTER — Ambulatory Visit: Payer: Medicare PPO | Admitting: Family Medicine

## 2019-10-04 ENCOUNTER — Encounter: Payer: Self-pay | Admitting: Family Medicine

## 2019-10-04 ENCOUNTER — Encounter: Payer: Self-pay | Admitting: Cardiovascular Disease

## 2019-10-04 ENCOUNTER — Other Ambulatory Visit: Payer: Self-pay

## 2019-10-04 ENCOUNTER — Ambulatory Visit: Payer: Medicare PPO | Admitting: Family Medicine

## 2019-10-04 VITALS — BP 126/82 | HR 65 | Temp 97.7°F | Wt 163.0 lb

## 2019-10-04 DIAGNOSIS — I5042 Chronic combined systolic (congestive) and diastolic (congestive) heart failure: Secondary | ICD-10-CM

## 2019-10-04 DIAGNOSIS — E1142 Type 2 diabetes mellitus with diabetic polyneuropathy: Secondary | ICD-10-CM

## 2019-10-04 DIAGNOSIS — E7800 Pure hypercholesterolemia, unspecified: Secondary | ICD-10-CM

## 2019-10-04 DIAGNOSIS — I10 Essential (primary) hypertension: Secondary | ICD-10-CM | POA: Diagnosis not present

## 2019-10-04 DIAGNOSIS — N184 Chronic kidney disease, stage 4 (severe): Secondary | ICD-10-CM | POA: Diagnosis not present

## 2019-10-04 NOTE — Progress Notes (Signed)
   Subjective:    Patient ID: [name], female    DOB: October 23, 1940, 79 y.o.   MRN: [id number]  HPI Chief Complaint  Patient presents with  . follow-up    follow-up - needs diabetes shoes from MD   She is here for a 6 month follow up on chronic health conditions. Her daughter is with her today.  She has a medical team.   Other doctors caring for patient include: Dr. Elisha Ponder- podiatry Dr. [name]- Cardiology Dr. Halford Chessman- Pulmonology Dr. Havery Moros- GI Dr.Bhandari- nephrologist.   She is on oxygen 2L [place] at night only. Followed by pulmonologist.   Diabetes-  currently diet controlled. Last Hgb A1c 5.8% in 03/2019 She keeps a close eye on her BS at home and readings have been between 90s and 110. No low readings.   She sees her podiatrist every 3 months. She is in need of diabetic shoes and needs an order to get these. I will discuss this with my supervising physician Dr. Redmond School and give her a prescription for this.   HTN and CHF - doing well on medications, followed by cardiologist. No concerns.   HL- she is taking Lipitor 40 mg.  Last LDL 30. Will recheck this today.   She is due to have labs done today for her nephrologist who she is seeing soon.  Would like to have lab orders put in so that she can get these all at one time.   She received her 2nd Covid vaccine yesterday and is doing fine.   Takes Myrbetriq Friday- Sunday usually but does not need it when she is staying at home during the rest of the week.   Denies fever, chills, dizziness, chest pain, palpitations, shortness of breath, abdominal pain, N/V/D.    Reviewed allergies, medications, past medical, surgical, family, and social history.    Review of Systems Pertinent positives and negatives in the history of present illness.     Objective:   Physical Exam BP 126/82   Pulse 65   Temp 97.7 F (36.5 C)   Wt 163 lb (73.9 kg)   BMI 29.34 kg/m         Assessment & Plan:  Type 2 diabetes, controlled,  with peripheral neuropathy (HCC) - Plan: Hemoglobin A1c  Essential hypertension  Hypercholesterolemia - Plan: Lipid panel  Chronic combined systolic (congestive) and diastolic (congestive) heart failure (HCC)  She is pleasant and at her baseline.  Due to have labs done later today at [facility].  I will check a Hgb A1c and lipid panel.  I will consult with her cardiologist regarding reducing her statin if her LDL is still in the 30 range.  Blood sugars at home have been in goal range without any hypoglycemia.  Euvolemic appearing.  BP is in goal range.  She needs diabetic shoes and Dr. Redmond School and myself will sign off on her getting these.  Follow up when due for her AWV and pending labs.

## 2019-10-05 LAB — LIPID PANEL
Chol/HDL Ratio: 1.5 ratio (ref 0.0–4.4)
Cholesterol, Total: 159 mg/dL (ref 100–199)
HDL: 109 mg/dL
LDL Chol Calc (NIH): 38 mg/dL (ref 0–99)
Triglycerides: 62 mg/dL (ref 0–149)
VLDL Cholesterol Cal: 12 mg/dL (ref 5–40)

## 2019-10-05 LAB — HEMOGLOBIN A1C
Est. average glucose Bld gHb Est-mCnc: 126 mg/dL
Hgb A1c MFr Bld: 6 % — ABNORMAL HIGH (ref 4.8–5.6)

## 2019-10-11 DIAGNOSIS — D631 Anemia in chronic kidney disease: Secondary | ICD-10-CM | POA: Diagnosis not present

## 2019-10-11 DIAGNOSIS — N2581 Secondary hyperparathyroidism of renal origin: Secondary | ICD-10-CM | POA: Diagnosis not present

## 2019-10-11 DIAGNOSIS — R609 Edema, unspecified: Secondary | ICD-10-CM | POA: Diagnosis not present

## 2019-10-11 DIAGNOSIS — I129 Hypertensive chronic kidney disease with stage 1 through stage 4 chronic kidney disease, or unspecified chronic kidney disease: Secondary | ICD-10-CM | POA: Diagnosis not present

## 2019-10-11 DIAGNOSIS — N1832 Chronic kidney disease, stage 3b: Secondary | ICD-10-CM | POA: Diagnosis not present

## 2019-10-13 ENCOUNTER — Ambulatory Visit: Payer: Medicare PPO | Admitting: Podiatry

## 2019-10-25 ENCOUNTER — Other Ambulatory Visit: Payer: Self-pay | Admitting: Family Medicine

## 2019-10-25 NOTE — Telephone Encounter (Signed)
Is this okay to refill>?

## 2019-10-25 NOTE — Telephone Encounter (Signed)
Pt states she is taking this med 5-6 days out of the week. Ok to refill for 90?

## 2019-10-25 NOTE — Telephone Encounter (Signed)
ok 

## 2019-10-25 NOTE — Telephone Encounter (Signed)
Please ask if she is only taking this on the weekends still. If so, we really need to prescribe it that way or I am ok with giving her 30 tablets every 3 months.

## 2019-11-08 ENCOUNTER — Other Ambulatory Visit: Payer: Self-pay | Admitting: Family Medicine

## 2019-11-27 ENCOUNTER — Other Ambulatory Visit: Payer: Self-pay

## 2019-11-27 ENCOUNTER — Ambulatory Visit (INDEPENDENT_AMBULATORY_CARE_PROVIDER_SITE_OTHER): Payer: Medicare PPO | Admitting: Cardiovascular Disease

## 2019-11-27 ENCOUNTER — Encounter: Payer: Self-pay | Admitting: Cardiovascular Disease

## 2019-11-27 VITALS — BP 132/68 | HR 64 | Ht 62.0 in | Wt 163.0 lb

## 2019-11-27 DIAGNOSIS — I2721 Secondary pulmonary arterial hypertension: Secondary | ICD-10-CM

## 2019-11-27 DIAGNOSIS — Z9989 Dependence on other enabling machines and devices: Secondary | ICD-10-CM

## 2019-11-27 DIAGNOSIS — N1832 Chronic kidney disease, stage 3b: Secondary | ICD-10-CM | POA: Diagnosis not present

## 2019-11-27 DIAGNOSIS — I10 Essential (primary) hypertension: Secondary | ICD-10-CM | POA: Diagnosis not present

## 2019-11-27 DIAGNOSIS — E7800 Pure hypercholesterolemia, unspecified: Secondary | ICD-10-CM

## 2019-11-27 DIAGNOSIS — Z95810 Presence of automatic (implantable) cardiac defibrillator: Secondary | ICD-10-CM | POA: Diagnosis not present

## 2019-11-27 DIAGNOSIS — E1142 Type 2 diabetes mellitus with diabetic polyneuropathy: Secondary | ICD-10-CM

## 2019-11-27 DIAGNOSIS — I471 Supraventricular tachycardia: Secondary | ICD-10-CM

## 2019-11-27 DIAGNOSIS — I2510 Atherosclerotic heart disease of native coronary artery without angina pectoris: Secondary | ICD-10-CM | POA: Diagnosis not present

## 2019-11-27 DIAGNOSIS — I5022 Chronic systolic (congestive) heart failure: Secondary | ICD-10-CM | POA: Diagnosis not present

## 2019-11-27 DIAGNOSIS — G4733 Obstructive sleep apnea (adult) (pediatric): Secondary | ICD-10-CM

## 2019-11-27 NOTE — Patient Instructions (Signed)

## 2019-11-27 NOTE — Progress Notes (Signed)
Cardiology Office Note:    Date:  11/28/2019   ID:  [name], DOB 11-01-1940, MRN [id number]  PCP:  Erin Rm, NP-C  Cardiologist:  Erin Klein, MD   Referring MD: Erin Rm, NP-C   Chief complaint: CHF, ICD  History of Present Illness:    [name] is a 79 y.o. female with a hx of nonischemic cardiomyopathy (previous EF 35%, most recent EF 45%), minor nonobstructive coronary artery disease (last heart cath December 2015), status post defibrillator ([employer] [place] dual-chamber, implanted 2013, Sprint [place] 6949-lead under advisory) severe pulmonary hypertension, obstructive sleep apnea, restrictive lung disease, essential hypertension, hypercholesterolemia type 2 diabetes mellitus, which is currently diet controlled.  She feels that her functional abilities have actually improved compared to a year ago.  She started to exercise a couple of days a week.  Continues to have NYHA functional class II exertional dyspnea.  Always sleeps on 2 pillows, no overt orthopnea.  Denies leg edema, palpitations, dizziness or syncope.  Never complains of chest pain.  She has not had any falls injuries or serious bleeding problems.  She has not required hospitalization or adjustment in her diuretic dose. She does not take RAAS inhibitors due to renal insufficiency (sees Dr. Carolin [name]).  Device checks shows normal ICD function.  Estimated generator longevity is 2.8 years.  She had an episode of paroxysmal atrial tachycardia or possibly slow atrial flutter at about 200 bpm on May 2.  It lasted for about an hour and was asymptomatic.  There was mostly 3: 1 AV conduction with an average ventricular rate of about 80 bpm.  She is on high-dose carvedilol.  There was no true atrial fibrillation.  Similar short episodes of atrial tachycardia/flutter have been recorded in the past.  She has lost weight.  She is no longer taking medicines for diabetes and her most recent hemoglobin A1c was 6%  in March 2021.  Her lipid profile from the same date showed an excellent HDL of 109 and LDL well within target range at 38.  Her creatinine has actually shown a stable or improving trend most recently creatinine of 1.51 (GFR 37).  She does have problems with lower extremity edema but she self manages this by adjusting her dose of diuretic.  If she develops weight gain and ankle swelling she increases her dose of furosemide from 40 mg daily to 80 mg daily.  She only takes the diuretic 6 days a week.  She has to increase the dose of diuretic to the higher dose no more than twice a week on the average.  She is compliant with daily weights and is quite knowledgeable about sodium restriction.  She estimates her "dry weight" on her home scale at about 180 pounds.  Her most recent echocardiogram was performed in October 2016 and showed a left ventricular ejection fraction of 45% with a mildly dilated left ventricle measuring 5.3 cm in end diastole.  Mitral inflow showed a pattern of abnormal relaxation, consistent with the absence of elevated mean left atrial pressure, the E/e' prime ratio at the medial annulus was however elevated at 13.  The same echocardiogram also showed mild mitral insufficiency and mild to moderate tricuspid insufficiency and a mildly dilated left atrium.  The right atrium and right ventricle was described as normal.  There is a mention of mild aortic stenosis with a valve area calculated at 1.8 cm, but the gradients are quite normal (peak 10, mean 6 mmHg).  The most striking  abnormality on the echo is severe pulmonary artery pressure with an estimated systolic pressure of 70 mmHg assuming a right atrial pressure of 7 mmHg.  There are no objective measurements to assess right ventricular systolic function but qualitatively  the right ventricle is described as normal.    Her last cardiac catheterization was in December 2015 and described "mild plaquing and no obstructive CAD" in the left main  and circumflex coronary artery, while the LAD and RCA were described as normal.  At that time the EF was 35-45% by ventriculography in the left ventricular end-diastolic pressure was 30 mmHg.  The mean pulmonary artery pressure was 25 mmHg.  The pulmonary artery pressure was 90/23 mmHg (with systemic blood pressure 168/66).  Carotid duplex ultrasound performed in October 2017 showed minor plaque in both carotids and antegrade flow in both vertebral arteries.  Since January 2018 she has had a grand total of 14 minutes of atrial mode switch.  Review of the electrogram showed these events to primarily be paroxysmal atrial tachycardia.  This is usually conducted with some degree of AV block so that there is no high ventricular rate.  There is one exception where she had 1: 1 AV conduction, which was misinterpreted by her device is representing nonsustained ventricular tachycardia.  Lead parameters are normal.  As mentioned her ventricular lead is a Sprint [employer] [id number] under advisory but shows no evidence of SIC.  Her cardiologist in [place] for many years was Dr. Elberta [name], most recently Dr. Barnie [name]  She bears a diagnosis of restrictive lung disease and obstructive sleep apnea.  She is supposed to be on home oxygen.  She uses CPAP for severe obstructive sleep apnea with an apnea hypotony index of 59 and desaturations down to 80%.  Despite treatment with CPAP it has been very difficult to control her AHI.  She plans to follow-up with Dr. Halford [name] or with Dr. Elsworth [name] in the pulmonary clinic.  The report stated that prior PFTs showed no obstruction but fairly significant restriction and reduced DLCO but repeat PFTs today were improved, CT chest was unremarkable and the feeling was that her initial PFTs may have not been accurate.  Most recent hemoglobin A1c is documented as being 6.1%, on glimepiride monotherapy.  She is not on statin therapy, but her previous notes report that she was taking atorvastatin 40  mg daily and her LDL was 36.[name]  Her baseline creatinine seems to be 1.6-1.9.  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  She had a pulmonary embolism 40 years ago.  EGD performed in 2011 showed mild erosive esophagitis and gastritis.  She was prescribed Protonix.  She has a remote history of depression for which she is not currently receiving any medications.  She has never smoked and denies use of alcohol.     Past Medical History:  Diagnosis Date  . Anemia   . CAD (coronary artery disease)   . Cardiac defibrillator in situ   . Cataract    bilateral 2017  . CHF (congestive heart failure) ([place])   . Chronic systolic heart failure ([place])   . CKD (chronic kidney disease) stage 4, GFR 15-29 ml/min (H[facility])   . COPD (chronic obstructive pulmonary disease) ([place])   . HTN (hypertension)   . Malignant neoplasm of rectum ([place]) 09/08/2017   Dr. Rollene Rotunda in [place]. Per notes she was due for colonoscopy in 12/2016  . OSA on CPAP 09/08/2017  . Other cardiomyopathies ([place])   .  Oxygen deficiency    2 liters at night  . Pulmonary HTN ([place])   . Sleep apnea    uses c-pap  . Stroke ([place])   . Type 2 diabetes, controlled, with peripheral neuropathy ([place])   . Urge incontinence of urine     Past Surgical History:  Procedure Laterality Date  . BIOPSY  03/29/2018   Procedure: BIOPSY;  Surgeon: Yetta Flock, MD;  Location: [facility];  Service: Gastroenterology;;  . COLONOSCOPY    . COLONOSCOPY WITH PROPOFOL N/A 03/29/2018   Procedure: COLONOSCOPY WITH PROPOFOL;  Surgeon: Yetta Flock, MD;  Location: [facility];  Service: Gastroenterology;  Laterality: N/A;  . ESOPHAGOGASTRODUODENOSCOPY (EGD) WITH PROPOFOL N/A 03/29/2018   Procedure: ESOPHAGOGASTRODUODENOSCOPY (EGD) WITH PROPOFOL;  Surgeon: Yetta Flock, MD;  Location: [facility];  Service: Gastroenterology;  Laterality: N/A;  . POLYPECTOMY  03/29/2018   Procedure: POLYPECTOMY;  Surgeon: Yetta Flock, MD;  Location: [facility];  Service: Gastroenterology;;  . TONSILLECTOMY AND ADENOIDECTOMY     age 79  . TUBAL LIGATION     years ago  . UPPER GASTROINTESTINAL ENDOSCOPY    . UPPER GI ENDOSCOPY  11/28/2009   mild erosive esophagitis, mild erosive changes in stomach, chronic constipation    Current Medications: Current Meds  Medication Sig  . ACCU-CHEK AVIVA PLUS test strip USE  TO TEST BLOOD SUGAR ONE TIME DAILY  . Accu-Chek Softclix Lancets lancets USE  AS  INSTRUCTED  . Alcohol Swabs (B-D SINGLE USE SWABS REGULAR) PADS USE  TO TEST BLOOD SUGAR ONE TIME DAILY  . amLODipine (NORVASC) 10 MG tablet TAKE 1 TABLET EVERY DAY  . aspirin 81 MG tablet Take 81 mg by mouth at bedtime.   [name] atorvastatin (LIPITOR) 40 MG tablet TAKE 1 TABLET EVERY DAY  . carvedilol (COREG) 25 MG tablet TAKE 1 TABLET TWICE DAILY WITH A MEAL  . ciclopirox (LOPROX) 0.77 % cream   . ferrous sulfate 325 (65 FE) MG tablet Take 325 mg by mouth 2 (two) times daily with a meal.  . furosemide (LASIX) 40 MG tablet TAKE 1 TABLET TWICE DAILY  . MYRBETRIQ 50 MG TB24 tablet TAKE 1 TABLET EVERY DAY  . NON FORMULARY [facility]  Onychomycosis Nail Lacquer -  Fluconazole 2%, Terbinafine 1% DMSO Apply to affected nail once daily Qty. 120 gm 3 refills  . OXYGEN Inhale 2 L into the lungs at bedtime.   [name] VITAMIN D, CHOLECALCIFEROL, PO Take by mouth.     Allergies:   Benazepril hcl, Lotrel [amlodipine besy-benazepril hcl], and Talwin [pentazocine]   Social History   Socioeconomic History  . Marital status: Widowed    Spouse name: Not on file  . Number of children: Not on file  . Years of education: Not on file  . Highest education level: Not on file  Occupational History  . Not on file  Tobacco Use  . Smoking status: Former Smoker    Quit date: 03/14/2009    Years since quitting: 10.7  . Smokeless tobacco: Never Used  . Tobacco comment: quit smoking 2010  Substance and Sexual Activity  . Alcohol use:  No  . Drug use: No  . Sexual activity: Not on file  Other Topics Concern  . Not on file  Social History Narrative   Lives alone.  2 daughters and 1 granddaughter in [place]   Social Determinants of Health   Financial Resource Strain:   . Difficulty of Paying Living Expenses:   Food Insecurity:   .  Worried About Charity fundraiser in the Last Year:   . Arboriculturist in the Last Year:   Transportation Needs:   . Film/video editor (Medical):   [name] Lack of Transportation (Non-Medical):   Physical Activity:   . Days of Exercise per Week:   . Minutes of Exercise per Session:   Stress:   . Feeling of Stress :   Social Connections:   . Frequency of Communication with Friends and Family:   . Frequency of Social Gatherings with Friends and Family:   . Attends Religious Services:   . Active Member of Clubs or Organizations:   . Attends Archivist Meetings:   [name] Marital Status:      Family History: The patient's family history is significant for a history of coronary artery disease at advanced ages.  And stroke at advanced ages ROS:   Please see the history of present illness.    All other systems are reviewed and are negative.   EKGs/Labs/Other Studies Reviewed:    The following studies were reviewed today: Comprehensive ICD check.  EKG:  EKG is ordered today.  It shows atrial paced, ventricular sensed rhythm with diffuse T wave flattening, which makes it hard to measure the QT accurately.  QTc seems to be around 500 ms. Recent Labs: 04/04/2019: ALT 11; BUN 18; Creatinine, Ser 1.65; Hemoglobin 12.4; Platelets 227; Potassium 3.7; Sodium 141; TSH 2.020  Recent Lipid Panel    Component Value Date/Time   CHOL 159 10/04/2019 1444   TRIG 62 10/04/2019 1444   HDL 109 10/04/2019 1444   CHOLHDL 1.5 10/04/2019 1444   CHOLHDL 2.3 06/25/2007 0700   VLDL 11 06/25/2007 0700   LDLCALC 38 10/04/2019 1444    Physical Exam:    VS:  BP 132/68   Pulse 64   Ht _0  (1.575 m)    Wt 163 lb (73.9 kg)   SpO2 94%   BMI 29.81 kg/m     Wt Readings from Last 3 Encounters:  11/27/19 163 lb (73.9 kg)  10/04/19 163 lb (73.9 kg)  04/04/19 171 lb 9.6 oz (77.8 kg)     General: Alert, oriented x3, no distress, overweight, no longer obese.  Healthy left subclavian device site. Head: no evidence of trauma, PERRL, EOMI, no exophtalmos or lid lag, no myxedema, no xanthelasma; normal ears, nose and oropharynx Neck: normal jugular venous pulsations and no hepatojugular reflux; brisk carotid pulses without delay and no carotid bruits Chest: clear to auscultation, no signs of consolidation by percussion or palpation, normal fremitus, symmetrical and full respiratory excursions Cardiovascular: normal position and quality of the apical impulse, regular rhythm, normal first and second heart sounds, no murmurs, rubs or gallops Abdomen: no tenderness or distention, no masses by palpation, no abnormal pulsatility or arterial bruits, normal bowel sounds, no hepatosplenomegaly Extremities: no clubbing, cyanosis or edema; 2+ radial, ulnar and brachial pulses bilaterally; 2+ right femoral, posterior tibial and dorsalis pedis pulses; 2+ left femoral, posterior tibial and dorsalis pedis pulses; no subclavian or femoral bruits Neurological: grossly nonfocal Psych: Normal mood and affect    ASSESSMENT:    1. Chronic systolic heart failure ([place])   2. PAH (pulmonary artery hypertension) ([place])   3. ICD (implantable cardioverter-defibrillator) in place   4. PAT (paroxysmal atrial tachycardia) ([place])   5. Essential hypertension   6. Stage 3b chronic kidney disease   7. Type 2 diabetes, controlled, with peripheral neuropathy ([place])   8. Hypercholesterolemia   9.  Coronary artery disease involving native coronary artery of native heart without angina pectoris   10. OSA on CPAP    PLAN:    In order of problems listed above:  1. CHF: NYHA functional class II, clinically euvolemic without need  for additional diuretic dosing in several months.  She remains very diligent with sodium restriction and daily weight monitoring.  She continues to lose real weight.  It seems that her current "dry weight" is in the 160-165 pound range.  Feels better after stopping the hydralazine, without further episodes of weakness.  Try to continue high-dose carvedilol since it may help to suppress her atrial arrhythmias or at least continue to provide good AV node blockage..  Not on RAAS inhibitors due to renal dysfunction.   2. PAH: We have not reevaluated this recently but it was very severe on her previous echo and cardiac catheterization from [place].  By clinical exam she does not have features of pulmonary hypertension or right heart failure.    At the time of a cardiac catheterization, although her pulmonary artery wedge pressure was moderately increased, she had a very high transpulmonary gradient suggesting that the major cause of the pulmonary hypertension is fixed arteriolar disease.  Although her pulmonary function tests appeared to be very abnormal when initially evaluated, follow-up PFTs were only mildly abnormal.  It appears that the dominant reason for her severe pulmonary hypertension might be insufficiently treated obstructive sleep apnea.  She has lost about 20-25 pounds in the last couple of years. 3. ICD: Normal defibrillator function.  Her RV lead is under advisory, but we are not planning lead revision unless it is dysfunctional.  She does not have ventricular pacing.   She has never received tachycardia therapies from her defibrillator.  Continue remote downloads every 3 months and yearly office visits. 4. PAT: Had a sustained episode that lasted just over an hour, but it was well rate controlled and asymptomatic.  Continue carvedilol. 5. HTN: Excellent control.  Sharon's mom has an HDL of 109 6. CKD 3B: Improved, 7. DM: Well-controlled without medications now, after substantial weight  loss. 8. HLP: Excellent lipid profile on current medications. 9. CAD: Does not have angina pectoris.  Minor plaque at her last cardiac catheterization and minor carotid plaque by ultrasound.  On low-dose aspirin. 10. OSA: Reports compliance with CPAP. This remains the most likely reason for her disproportionate pulmonary hypertension although she also has an uncertain amount of interstitial lung disease.  It is possible there is been improvement in her sleep apnea and the pulmonary hypertension with the weight loss.    Medication Adjustments/Labs and Tests Ordered: Patient Current medicines are reviewed at length with the patient today.  Concerns regarding medicines are outlined above.  Orders Placed This Encounter  Procedures  . EKG 12-Lead   No orders of the defined types were placed in this encounter.   Signed, Erin Klein, MD  11/28/2019 8:51 AM    [facility]

## 2019-11-28 ENCOUNTER — Encounter: Payer: Self-pay | Admitting: Cardiovascular Disease

## 2019-12-06 ENCOUNTER — Ambulatory Visit (INDEPENDENT_AMBULATORY_CARE_PROVIDER_SITE_OTHER): Payer: Medicare PPO | Admitting: *Deleted

## 2019-12-06 DIAGNOSIS — I5022 Chronic systolic (congestive) heart failure: Secondary | ICD-10-CM

## 2019-12-06 DIAGNOSIS — I471 Supraventricular tachycardia: Secondary | ICD-10-CM

## 2019-12-06 LAB — CUP PACEART REMOTE DEVICE CHECK
Battery Remaining Longevity: 35 mo
Battery Voltage: 2.95 V
Brady Statistic AP VP Percent: 0.1 %
Brady Statistic AP VS Percent: 93.01 %
Brady Statistic AS VP Percent: 0 %
Brady Statistic AS VS Percent: 6.88 %
Brady Statistic RA Percent Paced: 92.64 %
Brady Statistic RV Percent Paced: 0.13 %
Date Time Interrogation Session: 20210519033525
HighPow Impedance: 36 Ohm
HighPow Impedance: 49 Ohm
Implantable Pulse Generator Implant Date: 20131120
Lead Channel Impedance Value: 304 Ohm
Lead Channel Impedance Value: 342 Ohm
Lead Channel Impedance Value: 380 Ohm
Lead Channel Pacing Threshold Amplitude: 0.625 V
Lead Channel Pacing Threshold Amplitude: 0.625 V
Lead Channel Pacing Threshold Pulse Width: 0.4 ms
Lead Channel Pacing Threshold Pulse Width: 0.4 ms
Lead Channel Sensing Intrinsic Amplitude: 0.625 mV
Lead Channel Sensing Intrinsic Amplitude: 0.625 mV
Lead Channel Sensing Intrinsic Amplitude: 5.75 mV
Lead Channel Sensing Intrinsic Amplitude: 5.75 mV
Lead Channel Setting Pacing Amplitude: 1.5 V
Lead Channel Setting Pacing Amplitude: 2 V
Lead Channel Setting Pacing Pulse Width: 0.4 ms
Lead Channel Setting Sensing Sensitivity: 0.3 mV

## 2019-12-08 NOTE — Progress Notes (Signed)
Remote ICD transmission.   

## 2019-12-13 ENCOUNTER — Ambulatory Visit: Payer: Medicare PPO

## 2019-12-25 ENCOUNTER — Other Ambulatory Visit: Payer: Self-pay

## 2019-12-25 ENCOUNTER — Ambulatory Visit
Admission: RE | Admit: 2019-12-25 | Discharge: 2019-12-25 | Disposition: A | Discharge status: Home / Self Care | Payer: Medicare PPO | Source: Ambulatory Visit | Attending: Family Medicine | Admitting: Family Medicine

## 2019-12-25 DIAGNOSIS — Z1231 Encounter for screening mammogram for malignant neoplasm of breast: Secondary | ICD-10-CM | POA: Diagnosis not present

## 2020-01-05 ENCOUNTER — Encounter: Payer: Self-pay | Admitting: Podiatry

## 2020-01-05 ENCOUNTER — Ambulatory Visit: Payer: Medicare PPO | Admitting: Podiatry

## 2020-01-05 ENCOUNTER — Telehealth: Payer: Self-pay | Admitting: Podiatry

## 2020-01-05 ENCOUNTER — Other Ambulatory Visit: Payer: Self-pay

## 2020-01-05 DIAGNOSIS — B351 Tinea unguium: Secondary | ICD-10-CM

## 2020-01-05 DIAGNOSIS — M79675 Pain in left toe(s): Secondary | ICD-10-CM

## 2020-01-05 DIAGNOSIS — Z794 Long term (current) use of insulin: Secondary | ICD-10-CM | POA: Diagnosis not present

## 2020-01-05 DIAGNOSIS — E0822 Diabetes mellitus due to underlying condition with diabetic chronic kidney disease: Secondary | ICD-10-CM

## 2020-01-05 DIAGNOSIS — N184 Chronic kidney disease, stage 4 (severe): Secondary | ICD-10-CM

## 2020-01-05 DIAGNOSIS — L84 Corns and callosities: Secondary | ICD-10-CM | POA: Diagnosis not present

## 2020-01-05 DIAGNOSIS — E1142 Type 2 diabetes mellitus with diabetic polyneuropathy: Secondary | ICD-10-CM

## 2020-01-05 DIAGNOSIS — M79674 Pain in right toe(s): Secondary | ICD-10-CM

## 2020-01-05 DIAGNOSIS — M2042 Other hammer toe(s) (acquired), left foot: Secondary | ICD-10-CM

## 2020-01-05 DIAGNOSIS — M2041 Other hammer toe(s) (acquired), right foot: Secondary | ICD-10-CM

## 2020-01-05 NOTE — Telephone Encounter (Signed)
l tried to call pt to discuss diabetic shoes with pt because she dropped of a prescription from pcp and stated she was molded in 2019 but the voicemail is full.

## 2020-01-05 NOTE — Patient Instructions (Signed)
Diabetes Mellitus and Foot Care Foot care is an important part of your health, especially when you have diabetes. Diabetes may cause you to have problems because of poor blood flow (circulation) to your feet and legs, which can cause your skin to:  Become thinner and drier.  Break more easily.  Heal more slowly.  Peel and crack. You may also have nerve damage (neuropathy) in your legs and feet, causing decreased feeling in them. This means that you may not notice minor injuries to your feet that could lead to more serious problems. Noticing and addressing any potential problems early is the best way to prevent future foot problems. How to care for your feet Foot hygiene  Wash your feet daily with warm water and mild soap. Do not use hot water. Then, pat your feet and the areas between your toes until they are completely dry. Do not soak your feet as this can dry your skin.  Trim your toenails straight across. Do not dig under them or around the cuticle. File the edges of your nails with an emery board or nail file.  Apply a moisturizing lotion or petroleum jelly to the skin on your feet and to dry, brittle toenails. Use lotion that does not contain alcohol and is unscented. Do not apply lotion between your toes. Shoes and socks  Wear clean socks or stockings every day. Make sure they are not too tight. Do not wear knee-high stockings since they may decrease blood flow to your legs.  Wear shoes that fit properly and have enough cushioning. Always look in your shoes before you put them on to be sure there are no objects inside.  To break in new shoes, wear them for just a few hours a day. This prevents injuries on your feet. Wounds, scrapes, corns, and calluses  Check your feet daily for blisters, cuts, bruises, sores, and redness. If you cannot see the bottom of your feet, use a mirror or ask someone for help.  Do not cut corns or calluses or try to remove them with medicine.  If you  find a minor scrape, cut, or break in the skin on your feet, keep it and the skin around it clean and dry. You may clean these areas with mild soap and water. Do not clean the area with peroxide, alcohol, or iodine.  If you have a wound, scrape, corn, or callus on your foot, look at it several times a day to make sure it is healing and not infected. Check for: ? Redness, swelling, or pain. ? Fluid or blood. ? Warmth. ? Pus or a bad smell. General instructions  Do not cross your legs. This may decrease blood flow to your feet.  Do not use heating pads or hot water bottles on your feet. They may burn your skin. If you have lost feeling in your feet or legs, you may not know this is happening until it is too late.  Protect your feet from hot and cold by wearing shoes, such as at the beach or on hot pavement.  Schedule a complete foot exam at least once a year (annually) or more often if you have foot problems. If you have foot problems, report any cuts, sores, or bruises to your health care provider immediately. Contact a health care provider if:  You have a medical condition that increases your risk of infection and you have any cuts, sores, or bruises on your feet.  You have an injury that is not   healing.  You have redness on your legs or feet.  You feel burning or tingling in your legs or feet.  You have pain or cramps in your legs and feet.  Your legs or feet are numb.  Your feet always feel cold.  You have pain around a toenail. Get help right away if:  You have a wound, scrape, corn, or callus on your foot and: ? You have pain, swelling, or redness that gets worse. ? You have fluid or blood coming from the wound, scrape, corn, or callus. ? Your wound, scrape, corn, or callus feels warm to the touch. ? You have pus or a bad smell coming from the wound, scrape, corn, or callus. ? You have a fever. ? You have a red line going up your leg. Summary  Check your feet every day  for cuts, sores, red spots, swelling, and blisters.  Moisturize feet and legs daily.  Wear shoes that fit properly and have enough cushioning.  If you have foot problems, report any cuts, sores, or bruises to your health care provider immediately.  Schedule a complete foot exam at least once a year (annually) or more often if you have foot problems. This information is not intended to replace advice given to you by your health care provider. Make sure you discuss any questions you have with your health care provider. Document Revised: 03/29/2019 Document Reviewed: 08/07/2016 Elsevier Patient Education  2020 [employer]  

## 2020-01-13 NOTE — Progress Notes (Signed)
Subjective: [name] presents today at risk foot care. Pt has h/o NIDDM with chronic kidney disease and painful mycotic nails b/l that are difficult to trim. Pain interferes with ambulation. Aggravating factors include wearing enclosed shoe gear. Pain is relieved with periodic professional debridement.  Erin Rm, NP-C is patient's PCP. Last visit was: 10/04/2019.  She voices no new pedal concerns on today's visit.  Past Medical History:  Diagnosis Date  . Anemia   . CAD (coronary artery disease)   . Cardiac defibrillator in situ   . Cataract    bilateral 2017  . CHF (congestive heart failure) ([place])   . Chronic systolic heart failure ([place])   . CKD (chronic kidney disease) stage 4, GFR 15-29 ml/min (HCC)   . COPD (chronic obstructive pulmonary disease) ([place])   . HTN (hypertension)   . 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 Rollene Rotunda in [place]. Per notes she was due for colonoscopy in 12/2016  . OSA on CPAP 09/08/2017  . Other cardiomyopathies ([place])   . Oxygen deficiency    2 liters at night  . Pulmonary HTN ([place])   . Sleep apnea    uses c-pap  . Stroke ([place])   . Type 2 diabetes, controlled, with peripheral neuropathy ([place])   . Urge incontinence of urine      Patient Active Problem List   Diagnosis Date Noted  . Abnormal result of cardiovascular function study, unspecified 09/16/2018  . Callus 09/16/2018  . Claudication of both lower extremities ([place]) 09/16/2018  . Dependence on other enabling machines and devices 09/16/2018  . Dizziness 09/16/2018  . Dyspnea, unspecified 09/16/2018  . Medicare annual wellness visit, subsequent 09/16/2018  . Onychomycosis due to dermatophyte 09/16/2018  . Overgrown toenails 09/16/2018  . Other secondary pulmonary hypertension ([place]) 09/16/2018  . Iron deficiency anemia   . On home O2   . Benign neoplasm of colon   . PAH (pulmonary artery hypertension) ([place]) 09/09/2017  . PAT (paroxysmal atrial tachycardia) ([place])  09/09/2017  . Hypercholesterolemia 09/09/2017  . OSA on CPAP 09/08/2017  . 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 Type 2 diabetes, controlled, with peripheral neuropathy ([place])   . 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 Chronic combined systolic (congestive) and diastolic (congestive) heart failure ([place])   . CKD (chronic kidney disease) stage 4, GFR 15-29 ml/min (HCC)   . ICD (implantable cardioverter-defibrillator) in place   . Urge incontinence of urine   . Coronary artery disease involving native coronary artery of native heart without angina pectoris   . Chronic kidney disease 09/06/2017  . Restrictive lung disease 05/04/2017  . Body mass index (bmi) 33.0-33.9, adult 06/13/2014  . DEPRESSION 07/26/2007  . Essential hypertension 07/26/2007  . CARDIOMYOPATHY 07/26/2007  . COPD (chronic obstructive pulmonary disease) ([place]) 07/26/2007    Current Outpatient Medications on File Prior to Visit  Medication Sig Dispense Refill  . ACCU-CHEK AVIVA PLUS test strip USE  TO TEST BLOOD SUGAR ONE TIME DAILY 100 strip 1  . Accu-Chek Softclix Lancets lancets USE  AS  INSTRUCTED 100 each 2  . Alcohol Swabs (B-D SINGLE USE SWABS REGULAR) PADS USE  TO TEST BLOOD SUGAR ONE TIME DAILY 100 each 1  . amLODipine (NORVASC) 10 MG tablet TAKE 1 TABLET EVERY DAY 90 tablet 0  . aspirin 81 MG tablet Take 81 mg by mouth at bedtime.     [name] atorvastatin (LIPITOR) 40 MG tablet TAKE 1 TABLET EVERY DAY 90 tablet 0  .  carvedilol (COREG) 25 MG tablet TAKE 1 TABLET TWICE DAILY WITH A MEAL 90 tablet 0  . ciclopirox (LOPROX) 0.77 % cream     . ferrous sulfate 325 (65 FE) MG tablet Take 325 mg by mouth 2 (two) times daily with a meal.    . furosemide (LASIX) 40 MG tablet TAKE 1 TABLET TWICE DAILY 90 tablet 0  . MYRBETRIQ 50 MG TB24 tablet TAKE 1 TABLET EVERY DAY 90 tablet 0  . NON FORMULARY [facility]  Onychomycosis Nail Lacquer -  Fluconazole 2%, Terbinafine 1% DMSO Apply to affected nail once daily Qty. 120  gm 3 refills    . OXYGEN Inhale 2 L into the lungs at bedtime.     [name] VITAMIN D, CHOLECALCIFEROL, PO Take by mouth.     No current facility-administered medications on file prior to visit.     Allergies  Allergen Reactions  . Benazepril Hcl   . Lotrel [Amlodipine Besy-Benazepril Hcl]   . Talwin [Pentazocine]     Hallulate    Objective: [name] is a pleasant 79 y.o. African American female WD, WN in NAD. AAO x 3.  There were no vitals filed for this visit.  Vascular Examination: Capillary fill time to digits <3 seconds b/l lower extremities. Palpable DP pulse(s) b/l lower extremities Nonpalpable PT pulse(s) b/l lower extremities. Pedal hair sparse. Lower extremity skin temperature gradient within normal limits. No pain with calf compression b/l. No edema noted b/l lower extremities.  Dermatological Examination: Pedal skin with normal turgor, texture and tone bilaterally. No open wounds bilaterally. No interdigital macerations bilaterally. Toenails 1-5 b/l elongated, discolored, dystrophic, thickened, crumbly with subungual debris and tenderness to dorsal palpation. Hyperkeratotic lesion(s) submet head 1 left foot, submet head 1 right foot, submet head 5 left foot and submet head 5 right foot.  No erythema, no edema, no drainage, no flocculence.  Musculoskeletal: Normal muscle strength 5/5 to all lower extremity muscle groups bilaterally. No gross bony deformities bilaterally. Hammertoes noted to the 2-5 bilaterally.  Neurological Examination: Protective sensation diminished with 10g monofilament b/l. Proprioception intact bilaterally. Clonus negative b/l.  Last A1c: Hemoglobin A1C Latest Ref Rng & Units 10/04/2019 04/04/2019  HGBA1C 4.8 - 5.6 % 6.0(H) 5.8(H)  Some recent data might be hidden   Assessment: 1. Pain due to onychomycosis of toenails of both feet   2. Callus   3. Acquired hammertoes of both feet   4. Diabetic peripheral neuropathy associated with type 2  diabetes mellitus ([place])   5. Diabetes mellitus due to underlying condition with stage 4 chronic kidney disease, with long-term current use of insulin ([place])    Plan: -Examined patient. -Continue diabetic foot care principles. -Toenails 1-5 b/l were debrided in length and girth with sterile nail nippers and dremel without iatrogenic bleeding.  -Callus(es) submet head 1 left foot, submet head 1 right foot, submet head 5 left foot and submet head 5 right foot pared utilizing sterile scalpel blade without complication or incident. Total number debrided =4. -Patient to report any pedal injuries to medical professional immediately. -Patient to continue soft, supportive shoe gear daily. -Patient/POA to call should there be question/concern in the interim.  Return in about 3 months (around 04/06/2020) for diabetic nail and callus trim.  Marzetta Board, DPM

## 2020-01-15 ENCOUNTER — Other Ambulatory Visit: Payer: Self-pay | Admitting: Family Medicine

## 2020-01-31 ENCOUNTER — Ambulatory Visit: Payer: Medicare PPO | Admitting: Gastroenterology

## 2020-01-31 ENCOUNTER — Ambulatory Visit: Payer: Medicare PPO | Admitting: Pulmonary Disease

## 2020-02-02 ENCOUNTER — Ambulatory Visit: Payer: Medicare PPO | Admitting: Pulmonary Disease

## 2020-02-02 ENCOUNTER — Other Ambulatory Visit: Payer: Self-pay

## 2020-02-02 ENCOUNTER — Encounter: Payer: Self-pay | Admitting: Pulmonary Disease

## 2020-02-02 ENCOUNTER — Ambulatory Visit: Payer: Medicare PPO | Admitting: Orthotics

## 2020-02-02 VITALS — BP 120/70 | HR 100 | Temp 98.3°F | Ht 62.0 in | Wt 167.6 lb

## 2020-02-02 DIAGNOSIS — J9611 Chronic respiratory failure with hypoxia: Secondary | ICD-10-CM

## 2020-02-02 DIAGNOSIS — J449 Chronic obstructive pulmonary disease, unspecified: Secondary | ICD-10-CM | POA: Diagnosis not present

## 2020-02-02 DIAGNOSIS — G4733 Obstructive sleep apnea (adult) (pediatric): Secondary | ICD-10-CM

## 2020-02-02 NOTE — Progress Notes (Signed)
[name], Critical Care, and Sleep Medicine  Chief Complaint  Patient presents with  . Follow-up    sob with exertion.  CPAP broken for 3 months.     Constitutional:  BP 120/70 (BP Location: Left Arm, Cuff Size: Normal)   Pulse 100   Temp 98.3 F (36.8 C) (Oral)   Ht _0  (1.575 m)   Wt 167 lb 9.6 oz (76 kg)   SpO2 (S) (!) 71% Comment: RA, 2L applied, up to 91 %.  BMI 30.65 kg/m   Past Medical History:  Anemia, CAD, Systolic CHF, CKD, HTN, Rectal cancer, CVA, DM  Summary:  [name] is a 79 y.o. female former smoker with OSA and emphysema.  Subjective:   I last saw her in 2019.  Her CPAP machine broke.  She uses 2 liters oxygen at night.  Was using [facility] in [place].  No DME in [place].  SpO2 on room air after walking 1 lap today was 71%.  Recovered on 2 liters oxygen to above 90%.  Not having cough, wheeze, sputum, chest pain.  She gets occasional leg swelling, and breathing seems worse when she gets more swelling.   Physical Exam:   Appearance - well kempt  ENMT - no sinus tenderness, no nasal discharge, no oral exudate, Mallampati 2  Respiratory - no wheeze, or rales  CV - regular rate and rhythm, no murmurs  GI - soft, non tender  Lymph - no adenopathy noted in neck  Ext - no edema  Skin - no rashes  Neuro - normal strength, oriented x 3  Psych - normal mood and affect  Assessment/Plan:   COPD with emphysema. - she opted against inhaler therapy at this time - will reassess status at next visit and then determine if she needs repeat chest xray and PFT  Obstructive sleep apnea. - will need to arrange for in lab sleep study to assess current status of sleep apnea and then arrange for new DME to get supplies  Chronic respiratory failure with hypoxia. - 2 liters oxygen with exertion and sleep - will set her up with [facility] to pick up on medical supplies  A total of  34 minutes spent addressing patient care issues on day of  visit.  Follow up:  Patient Instructions  Will arrange for in lab sleep study  Will have [facility] arrange for 2 liters oxygen set up for daytime use with exertion  Continue using 2 liters oxygen at night  Follow up in 8 weeks   Signature:  [name], MD [place] Pager: [phone] 02/02/2020, 12:36 PM  Flow Sheet     [name] tests:   PFT 06/28/07 >> FEV1 0.73 (39%), FEV1% 92, TLC 2.41 (51%)  PFT 05/04/17 >> FEV1 1.22 (79%), FEV1% 69, TLC 3.12 (75%), DLCO 39%, +BD  Sleep tests:   PSG 10/02/13 >> AHI 59.6, SpO2 low 80%  PSG 07/05/14 >> AHI 15.1, SpO2 low 76%  CPAP 01/06/18 to 04/05/18 >> used on 90 of 90 nights with average 9 hrs 9 min.  Average AHI 8.2 with CPAP 18 cm H2O.  Cardiac tests:   Echo 05/13/15 >> EF 45%, PAS 70 mmHg  Medications:   Allergies as of 02/02/2020      Reactions   Benazepril Hcl    Lotrel [amlodipine Besy-benazepril Hcl]    Talwin [pentazocine]    Hallulate      Medication List       Accurate as of February 02, 2020 12:36 PM. If you have any questions, ask your nurse or doctor.        STOP taking these medications   ciclopirox 0.77 % cream Commonly known as: LOPROX Stopped by: [name], MD     TAKE these medications   Accu-Chek Aviva Plus test strip Generic drug: glucose blood USE  TO TEST BLOOD SUGAR ONE TIME DAILY   Accu-Chek Softclix Lancets lancets USE  AS  INSTRUCTED   amLODipine 10 MG tablet Commonly known as: NORVASC TAKE 1 TABLET EVERY DAY   aspirin 81 MG tablet Take 81 mg by mouth at bedtime.   atorvastatin 40 MG tablet Commonly known as: LIPITOR TAKE 1 TABLET EVERY DAY   B-D SINGLE USE SWABS REGULAR Pads USE  TO TEST BLOOD SUGAR ONE TIME DAILY   carvedilol 25 MG tablet Commonly known as: COREG TAKE 1 TABLET TWICE DAILY WITH A MEAL   ferrous sulfate 325 (65 FE) MG tablet Take 325 mg by mouth 2 (two) times daily with a meal.   furosemide 40 MG tablet Commonly known  as: LASIX TAKE 1 TABLET TWICE DAILY   Myrbetriq 50 MG Tb24 tablet Generic drug: mirabegron ER TAKE 1 TABLET EVERY DAY   NON FORMULARY [facility]  Onychomycosis Nail Lacquer -  Fluconazole 2%, Terbinafine 1% DMSO Apply to affected nail once daily Qty. 120 gm 3 refills   OXYGEN Inhale 2 L into the lungs at bedtime.   VITAMIN D (CHOLECALCIFEROL) PO Take by mouth.       Past Surgical History:  She  has a past surgical history that includes Upper gi endoscopy (11/28/2009); Upper gastrointestinal endoscopy; Colonoscopy; Tonsillectomy and adenoidectomy; Tubal ligation; Colonoscopy with propofol (N/A, 03/29/2018); Esophagogastroduodenoscopy (egd) with propofol (N/A, 03/29/2018); biopsy (03/29/2018); and polypectomy (03/29/2018).  Family History:  Her family history includes Colon polyps in her daughter.  Social History:  She  reports that she quit smoking about 10 years ago. She has never used smokeless tobacco. She reports that she does not drink alcohol and does not use drugs.

## 2020-02-02 NOTE — Patient Instructions (Signed)
Will arrange for in lab sleep study  Will have [facility] care arrange for 2 liters oxygen set up for daytime use with exertion  Continue using 2 liters oxygen at night  Follow up in 8 weeks

## 2020-02-07 ENCOUNTER — Other Ambulatory Visit: Payer: Self-pay | Admitting: Family Medicine

## 2020-02-11 ENCOUNTER — Inpatient Hospital Stay (HOSPITAL_COMMUNITY)
Admission: EM | Admit: 2020-02-11 | Discharge: 2020-02-22 | DRG: 286 | Disposition: A | Discharge status: Skilled Nursing Facility | Payer: Medicare PPO | Attending: Internal Medicine | Admitting: Internal Medicine

## 2020-02-11 ENCOUNTER — Other Ambulatory Visit: Payer: Self-pay | Admitting: Cardiovascular Disease

## 2020-02-11 ENCOUNTER — Encounter (HOSPITAL_COMMUNITY): Payer: Self-pay

## 2020-02-11 ENCOUNTER — Emergency Department (HOSPITAL_COMMUNITY): Payer: Medicare PPO

## 2020-02-11 DIAGNOSIS — I5082 Biventricular heart failure: Secondary | ICD-10-CM | POA: Diagnosis present

## 2020-02-11 DIAGNOSIS — I5043 Acute on chronic combined systolic (congestive) and diastolic (congestive) heart failure: Secondary | ICD-10-CM | POA: Diagnosis present

## 2020-02-11 DIAGNOSIS — Z87891 Personal history of nicotine dependence: Secondary | ICD-10-CM

## 2020-02-11 DIAGNOSIS — I2781 Cor pulmonale (chronic): Secondary | ICD-10-CM | POA: Diagnosis present

## 2020-02-11 DIAGNOSIS — I5023 Acute on chronic systolic (congestive) heart failure: Secondary | ICD-10-CM | POA: Diagnosis not present

## 2020-02-11 DIAGNOSIS — I2721 Secondary pulmonary arterial hypertension: Secondary | ICD-10-CM | POA: Diagnosis present

## 2020-02-11 DIAGNOSIS — I517 Cardiomegaly: Secondary | ICD-10-CM | POA: Diagnosis not present

## 2020-02-11 DIAGNOSIS — G4733 Obstructive sleep apnea (adult) (pediatric): Secondary | ICD-10-CM | POA: Diagnosis present

## 2020-02-11 DIAGNOSIS — E1122 Type 2 diabetes mellitus with diabetic chronic kidney disease: Secondary | ICD-10-CM | POA: Diagnosis present

## 2020-02-11 DIAGNOSIS — E119 Type 2 diabetes mellitus without complications: Secondary | ICD-10-CM | POA: Diagnosis not present

## 2020-02-11 DIAGNOSIS — T380X5A Adverse effect of glucocorticoids and synthetic analogues, initial encounter: Secondary | ICD-10-CM | POA: Diagnosis not present

## 2020-02-11 DIAGNOSIS — J9622 Acute and chronic respiratory failure with hypercapnia: Secondary | ICD-10-CM | POA: Diagnosis present

## 2020-02-11 DIAGNOSIS — J9621 Acute and chronic respiratory failure with hypoxia: Secondary | ICD-10-CM | POA: Diagnosis present

## 2020-02-11 DIAGNOSIS — I428 Other cardiomyopathies: Secondary | ICD-10-CM | POA: Diagnosis present

## 2020-02-11 DIAGNOSIS — N189 Chronic kidney disease, unspecified: Secondary | ICD-10-CM

## 2020-02-11 DIAGNOSIS — K5909 Other constipation: Secondary | ICD-10-CM | POA: Diagnosis present

## 2020-02-11 DIAGNOSIS — N184 Chronic kidney disease, stage 4 (severe): Secondary | ICD-10-CM | POA: Diagnosis present

## 2020-02-11 DIAGNOSIS — I2510 Atherosclerotic heart disease of native coronary artery without angina pectoris: Secondary | ICD-10-CM | POA: Diagnosis present

## 2020-02-11 DIAGNOSIS — R609 Edema, unspecified: Secondary | ICD-10-CM | POA: Diagnosis not present

## 2020-02-11 DIAGNOSIS — E785 Hyperlipidemia, unspecified: Secondary | ICD-10-CM | POA: Diagnosis present

## 2020-02-11 DIAGNOSIS — I4892 Unspecified atrial flutter: Secondary | ICD-10-CM | POA: Diagnosis not present

## 2020-02-11 DIAGNOSIS — D649 Anemia, unspecified: Secondary | ICD-10-CM | POA: Diagnosis not present

## 2020-02-11 DIAGNOSIS — I472 Ventricular tachycardia: Secondary | ICD-10-CM | POA: Diagnosis present

## 2020-02-11 DIAGNOSIS — I272 Pulmonary hypertension, unspecified: Secondary | ICD-10-CM | POA: Diagnosis not present

## 2020-02-11 DIAGNOSIS — Z86711 Personal history of pulmonary embolism: Secondary | ICD-10-CM

## 2020-02-11 DIAGNOSIS — J9690 Respiratory failure, unspecified, unspecified whether with hypoxia or hypercapnia: Secondary | ICD-10-CM | POA: Diagnosis not present

## 2020-02-11 DIAGNOSIS — J9601 Acute respiratory failure with hypoxia: Secondary | ICD-10-CM | POA: Diagnosis not present

## 2020-02-11 DIAGNOSIS — I442 Atrioventricular block, complete: Secondary | ICD-10-CM | POA: Diagnosis present

## 2020-02-11 DIAGNOSIS — R0902 Hypoxemia: Secondary | ICD-10-CM | POA: Diagnosis not present

## 2020-02-11 DIAGNOSIS — J449 Chronic obstructive pulmonary disease, unspecified: Secondary | ICD-10-CM | POA: Diagnosis not present

## 2020-02-11 DIAGNOSIS — Z9989 Dependence on other enabling machines and devices: Secondary | ICD-10-CM | POA: Diagnosis not present

## 2020-02-11 DIAGNOSIS — Z20822 Contact with and (suspected) exposure to covid-19: Secondary | ICD-10-CM | POA: Diagnosis present

## 2020-02-11 DIAGNOSIS — M6281 Muscle weakness (generalized): Secondary | ICD-10-CM | POA: Diagnosis not present

## 2020-02-11 DIAGNOSIS — Z79899 Other long term (current) drug therapy: Secondary | ICD-10-CM

## 2020-02-11 DIAGNOSIS — E7800 Pure hypercholesterolemia, unspecified: Secondary | ICD-10-CM | POA: Diagnosis present

## 2020-02-11 DIAGNOSIS — I471 Supraventricular tachycardia: Secondary | ICD-10-CM | POA: Diagnosis present

## 2020-02-11 DIAGNOSIS — Z789 Other specified health status: Secondary | ICD-10-CM | POA: Diagnosis not present

## 2020-02-11 DIAGNOSIS — Z8673 Personal history of transient ischemic attack (TIA), and cerebral infarction without residual deficits: Secondary | ICD-10-CM

## 2020-02-11 DIAGNOSIS — R1312 Dysphagia, oropharyngeal phase: Secondary | ICD-10-CM | POA: Diagnosis not present

## 2020-02-11 DIAGNOSIS — N179 Acute kidney failure, unspecified: Secondary | ICD-10-CM | POA: Diagnosis present

## 2020-02-11 DIAGNOSIS — Z85048 Personal history of other malignant neoplasm of rectum, rectosigmoid junction, and anus: Secondary | ICD-10-CM

## 2020-02-11 DIAGNOSIS — Z7982 Long term (current) use of aspirin: Secondary | ICD-10-CM

## 2020-02-11 DIAGNOSIS — E1165 Type 2 diabetes mellitus with hyperglycemia: Secondary | ICD-10-CM | POA: Diagnosis not present

## 2020-02-11 DIAGNOSIS — I10 Essential (primary) hypertension: Secondary | ICD-10-CM | POA: Diagnosis not present

## 2020-02-11 DIAGNOSIS — J849 Interstitial pulmonary disease, unspecified: Secondary | ICD-10-CM | POA: Diagnosis not present

## 2020-02-11 DIAGNOSIS — J90 Pleural effusion, not elsewhere classified: Secondary | ICD-10-CM | POA: Diagnosis not present

## 2020-02-11 DIAGNOSIS — I110 Hypertensive heart disease with heart failure: Secondary | ICD-10-CM | POA: Diagnosis not present

## 2020-02-11 DIAGNOSIS — J441 Chronic obstructive pulmonary disease with (acute) exacerbation: Secondary | ICD-10-CM | POA: Diagnosis present

## 2020-02-11 DIAGNOSIS — I509 Heart failure, unspecified: Secondary | ICD-10-CM | POA: Diagnosis present

## 2020-02-11 DIAGNOSIS — R0602 Shortness of breath: Secondary | ICD-10-CM | POA: Diagnosis not present

## 2020-02-11 DIAGNOSIS — Z6832 Body mass index (BMI) 32.0-32.9, adult: Secondary | ICD-10-CM

## 2020-02-11 DIAGNOSIS — Z95810 Presence of automatic (implantable) cardiac defibrillator: Secondary | ICD-10-CM

## 2020-02-11 DIAGNOSIS — Z9981 Dependence on supplemental oxygen: Secondary | ICD-10-CM

## 2020-02-11 DIAGNOSIS — I130 Hypertensive heart and chronic kidney disease with heart failure and stage 1 through stage 4 chronic kidney disease, or unspecified chronic kidney disease: Principal | ICD-10-CM | POA: Diagnosis present

## 2020-02-11 DIAGNOSIS — J9611 Chronic respiratory failure with hypoxia: Secondary | ICD-10-CM | POA: Diagnosis present

## 2020-02-11 DIAGNOSIS — Z743 Need for continuous supervision: Secondary | ICD-10-CM | POA: Diagnosis not present

## 2020-02-11 DIAGNOSIS — R279 Unspecified lack of coordination: Secondary | ICD-10-CM | POA: Diagnosis not present

## 2020-02-11 DIAGNOSIS — R262 Difficulty in walking, not elsewhere classified: Secondary | ICD-10-CM | POA: Diagnosis not present

## 2020-02-11 DIAGNOSIS — I959 Hypotension, unspecified: Secondary | ICD-10-CM | POA: Diagnosis not present

## 2020-02-11 DIAGNOSIS — R509 Fever, unspecified: Secondary | ICD-10-CM

## 2020-02-11 DIAGNOSIS — R498 Other voice and resonance disorders: Secondary | ICD-10-CM | POA: Diagnosis not present

## 2020-02-11 DIAGNOSIS — J9600 Acute respiratory failure, unspecified whether with hypoxia or hypercapnia: Secondary | ICD-10-CM | POA: Diagnosis not present

## 2020-02-11 LAB — BASIC METABOLIC PANEL
Anion gap: 8 (ref 5–15)
BUN: 22 mg/dL (ref 8–23)
CO2: 30 mmol/L (ref 22–32)
Calcium: 9.1 mg/dL (ref 8.9–10.3)
Chloride: 103 mmol/L (ref 98–111)
Creatinine, Ser: 1.8 mg/dL — ABNORMAL HIGH (ref 0.44–1.00)
GFR calc Af Amer: 30 mL/min — ABNORMAL LOW (ref >60)
GFR calc non Af Amer: 26 mL/min — ABNORMAL LOW (ref >60)
Glucose, Bld: 132 mg/dL — ABNORMAL HIGH (ref 70–99)
Potassium: 3.6 mmol/L (ref 3.5–5.1)
Sodium: 141 mmol/L (ref 135–145)

## 2020-02-11 LAB — MAGNESIUM: Magnesium: 2 mg/dL (ref 1.7–2.4)

## 2020-02-11 LAB — CBC
HCT: 37.1 % (ref 36.0–46.0)
Hemoglobin: 11.1 g/dL — ABNORMAL LOW (ref 12.0–15.0)
MCH: 26.9 pg (ref 26.0–34.0)
MCHC: 29.9 g/dL — ABNORMAL LOW (ref 30.0–36.0)
MCV: 90 fL (ref 80.0–100.0)
Platelets: 205 10*3/uL (ref 150–400)
RBC: 4.12 MIL/uL (ref 3.87–5.11)
RDW: 15.5 % (ref 11.5–15.5)
WBC: 4 10*3/uL (ref 4.0–10.5)
nRBC: 0 % (ref 0.0–0.2)

## 2020-02-11 LAB — TROPONIN I (HIGH SENSITIVITY)
Troponin I (High Sensitivity): 17 ng/L (ref <18)
Troponin I (High Sensitivity): 18 ng/L — ABNORMAL HIGH (ref <18)

## 2020-02-11 LAB — BRAIN NATRIURETIC PEPTIDE: B Natriuretic Peptide: 617.2 pg/mL — ABNORMAL HIGH (ref 0.0–100.0)

## 2020-02-11 MED ORDER — POTASSIUM CHLORIDE CRYS ER 20 MEQ PO TBCR
20.0000 meq | EXTENDED_RELEASE_TABLET | Freq: Once | ORAL | Status: AC
  Administered 2020-02-11: 20 meq via ORAL
  Filled 2020-02-11: qty 1

## 2020-02-11 MED ORDER — FUROSEMIDE 10 MG/ML IJ SOLN
40.0000 mg | Freq: Once | INTRAMUSCULAR | Status: AC
  Administered 2020-02-11: 40 mg via INTRAVENOUS
  Filled 2020-02-11: qty 4

## 2020-02-11 MED ORDER — METOLAZONE 2.5 MG PO TABS: 2.5000 mg | ORAL_TABLET | Freq: Every day | ORAL | 0 refills | Status: DC | PRN

## 2020-02-11 MED ORDER — NITROGLYCERIN 2 % TD OINT
1.0000 [in_us] | TOPICAL_OINTMENT | Freq: Four times a day (QID) | TRANSDERMAL | Status: DC
  Administered 2020-02-11 – 2020-02-15 (×18): 1 [in_us] via TOPICAL
  Filled 2020-02-11 (×2): qty 1
  Filled 2020-02-11: qty 30
  Filled 2020-02-11 (×2): qty 1

## 2020-02-11 MED ORDER — POTASSIUM CHLORIDE ER 10 MEQ PO TBCR
10.0000 meq | EXTENDED_RELEASE_TABLET | Freq: Every day | ORAL | 0 refills | Status: DC | PRN
Start: 2020-02-11 — End: 2020-02-22

## 2020-02-11 NOTE — ED Provider Notes (Signed)
[facility] EMERGENCY DEPARTMENT Provider Note   CSN: [id number] Arrival date & time: 02/11/20  1922     History Chief Complaint  Patient presents with  . Shortness of Breath    [name] is a 79 y.o. female.  HPI   Pt has a history of CHF.  She started noticing increased leg swelling and shortness of breath this weekend.  The leg swelling is bilateral.  The breathing worsens with lying down and with activity.    No chest pain.  No fever or cough.  Pt has been compliant with her medications.   No excessive salt intake.  Today sx became worse and she had to call the ED.  Past Medical History:  Diagnosis Date  . Anemia   . CAD (coronary artery disease)   . Cardiac defibrillator in situ   . Cataract    bilateral 2017  . CHF (congestive heart failure) ([place])   . Chronic systolic heart failure ([place])   . CKD (chronic kidney disease) stage 4, GFR 15-29 ml/min (HCC)   . COPD (chronic obstructive pulmonary disease) ([place])   . HTN (hypertension)   . 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 Rollene Rotunda in [place]. Per notes she was due for colonoscopy in 12/2016  . OSA on CPAP 09/08/2017  . Other cardiomyopathies ([place])   . Oxygen deficiency    2 liters at night  . Pulmonary HTN ([place])   . Sleep apnea    uses c-pap  . Stroke ([place])   . Type 2 diabetes, controlled, with peripheral neuropathy ([place])   . Urge incontinence of urine     Patient Active Problem List   Diagnosis Date Noted  . Abnormal result of cardiovascular function study, unspecified 09/16/2018  . Callus 09/16/2018  . Claudication of both lower extremities ([place]) 09/16/2018  . Dependence on other enabling machines and devices 09/16/2018  . Dizziness 09/16/2018  . Dyspnea, unspecified 09/16/2018  . Medicare annual wellness visit, subsequent 09/16/2018  . Onychomycosis due to dermatophyte 09/16/2018  . Overgrown toenails 09/16/2018  . Other secondary pulmonary hypertension ([place])  09/16/2018  . Iron deficiency anemia   . On home O2   . Benign neoplasm of colon   . PAH (pulmonary artery hypertension) ([place]) 09/09/2017  . PAT (paroxysmal atrial tachycardia) ([place]) 09/09/2017  . Hypercholesterolemia 09/09/2017  . OSA on CPAP 09/08/2017  . 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 Type 2 diabetes, controlled, with peripheral neuropathy ([place])   . Other cardiomyopathies ([place])   . Chronic combined systolic (congestive) and diastolic (congestive) heart failure ([place])   . CKD (chronic kidney disease) stage 4, GFR 15-29 ml/min (HCC)   . ICD (implantable cardioverter-defibrillator) in place   . Urge incontinence of urine   . Coronary artery disease involving native coronary artery of native heart without angina pectoris   . Chronic kidney disease 09/06/2017  . Restrictive lung disease 05/04/2017  . Body mass index (bmi) 33.0-33.9, adult 06/13/2014  . DEPRESSION 07/26/2007  . Essential hypertension 07/26/2007  . CARDIOMYOPATHY 07/26/2007  . COPD (chronic obstructive pulmonary disease) ([place]) 07/26/2007    Past Surgical History:  Procedure Laterality Date  . BIOPSY  03/29/2018   Procedure: BIOPSY;  Surgeon: Yetta Flock, MD;  Location: [facility];  Service: Gastroenterology;;  . COLONOSCOPY    . COLONOSCOPY WITH PROPOFOL N/A 03/29/2018   Procedure: COLONOSCOPY WITH PROPOFOL;  Surgeon: Yetta Flock, MD;  Location: [facility];  Service: Gastroenterology;  Laterality:  N/A;  . ESOPHAGOGASTRODUODENOSCOPY (EGD) WITH PROPOFOL N/A 03/29/2018   Procedure: ESOPHAGOGASTRODUODENOSCOPY (EGD) WITH PROPOFOL;  Surgeon: Yetta Flock, MD;  Location: [facility];  Service: Gastroenterology;  Laterality: N/A;  . POLYPECTOMY  03/29/2018   Procedure: POLYPECTOMY;  Surgeon: Yetta Flock, MD;  Location: [facility];  Service: Gastroenterology;;  . TONSILLECTOMY AND ADENOIDECTOMY     age 53  . TUBAL LIGATION     years ago  . UPPER GASTROINTESTINAL  ENDOSCOPY    . UPPER GI ENDOSCOPY  11/28/2009   mild erosive esophagitis, mild erosive changes in stomach, chronic constipation     OB History   No obstetric history on file.     Family History  Problem Relation Age of Onset  . Colon polyps Daughter   . Colon cancer Neg Hx   . Esophageal cancer Neg Hx   . Rectal cancer Neg Hx   . Stomach cancer Neg Hx     Social History   Tobacco Use  . Smoking status: Former Smoker    Quit date: 03/14/2009    Years since quitting: 10.9  . Smokeless tobacco: Never Used  . Tobacco comment: quit smoking 2010  Substance Use Topics  . Alcohol use: No  . Drug use: No    Home Medications Prior to Admission medications   Medication Sig Start Date End Date Taking? Authorizing Provider  ACCU-CHEK AVIVA PLUS test strip USE  TO TEST BLOOD SUGAR ONE TIME DAILY 02/08/20   Harland Dingwall L, NP-C  Accu-Chek Softclix Lancets lancets USE  AS  INSTRUCTED 06/26/19   Henson, Vickie L, NP-C  Alcohol Swabs (B-D SINGLE USE SWABS REGULAR) PADS USE  TO TEST BLOOD SUGAR ONE TIME DAILY 07/27/19   Henson, Vickie L, NP-C  amLODipine (NORVASC) 10 MG tablet TAKE 1 TABLET EVERY DAY 01/16/20   Henson, Vickie L, NP-C  aspirin 81 MG tablet Take 81 mg by mouth at bedtime.     [provider]  atorvastatin (LIPITOR) 40 MG tablet TAKE 1 TABLET EVERY DAY 01/16/20   Henson, Vickie L, NP-C  carvedilol (COREG) 25 MG tablet TAKE 1 TABLET TWICE DAILY WITH A MEAL 01/24/19   Henson, Vickie L, NP-C  ferrous sulfate 325 (65 FE) MG tablet Take 325 mg by mouth 2 (two) times daily with a meal.    [provider]  furosemide (LASIX) 40 MG tablet TAKE 1 TABLET TWICE DAILY 01/24/19   Henson, Vickie L, NP-C  metolazone (ZAROXOLYN) 2.5 MG tablet Take 1 tablet (2.5 mg total) by mouth daily as needed. 02/11/20 05/11/20  Croitoru, Mihai, MD  MYRBETRIQ 50 MG TB24 tablet TAKE 1 TABLET EVERY DAY 10/25/19   [place]a Rm, NP-C  NON FORMULARY [facility]  Onychomycosis Nail Lacquer -    Fluconazole 2%, Terbinafine 1% DMSO Apply to affected nail once daily Qty. 120 gm 3 refills    [provider]  OXYGEN Inhale 2 L into the lungs at bedtime.     [provider]  potassium chloride (KLOR-CON) 10 MEQ tablet Take 1 tablet (10 mEq total) by mouth daily as needed. 02/11/20 05/11/20  Croitoru, Mihai, MD  VITAMIN D, CHOLECALCIFEROL, PO Take by mouth.    [provider]    Allergies    Benazepril hcl, Lotrel [amlodipine besy-benazepril hcl], and Talwin [pentazocine]  Review of Systems   Review of Systems  All other systems reviewed and are negative.   Physical Exam Updated Vital Signs BP (!) 160/118   Pulse 59  Temp 97.9 F (36.6 C) (Oral)   Resp 17   SpO2 100%   Physical Exam Vitals and nursing note reviewed.  Constitutional:      Appearance: She is well-developed. She is not toxic-appearing or diaphoretic.  HENT:     Head: Normocephalic and atraumatic.     Right Ear: External ear normal.     Left Ear: External ear normal.  Eyes:     General: No scleral icterus.       Right eye: No discharge.        Left eye: No discharge.     Conjunctiva/sclera: Conjunctivae normal.  Neck:     Trachea: No tracheal deviation.  Cardiovascular:     Rate and Rhythm: Normal rate and regular rhythm.  Pulmonary:     Effort: Pulmonary effort is normal. No respiratory distress.     Breath sounds: No stridor. Examination of the right-upper field reveals rales. Examination of the left-upper field reveals rales. Examination of the right-middle field reveals rales. Examination of the left-middle field reveals rales. Examination of the right-lower field reveals rales. Examination of the left-lower field reveals rales. Rales present. No wheezing.  Abdominal:     General: Bowel sounds are normal. There is no distension.     Palpations: Abdomen is soft.     Tenderness: There is no abdominal tenderness. There is no guarding or rebound.  Musculoskeletal:         General: No tenderness.     Cervical back: Neck supple.     Right lower leg: No tenderness. Edema present.     Left lower leg: No tenderness. Edema present.  Skin:    General: Skin is warm and dry.     Findings: No rash.  Neurological:     Mental Status: She is alert.     Cranial Nerves: No cranial nerve deficit (no facial droop, extraocular movements intact, no slurred speech).     Sensory: No sensory deficit.     Motor: No abnormal muscle tone or seizure activity.     Coordination: Coordination normal.     ED Results / Procedures / Treatments   Labs (all labs ordered are listed, but only abnormal results are displayed) Labs Reviewed  BASIC METABOLIC PANEL - Abnormal; Notable for the following components:      Result Value   Glucose, Bld 132 (*)    Creatinine, Ser 1.80 (*)    GFR calc non Af Amer 26 (*)    GFR calc Af Amer 30 (*)    All other components within normal limits  BRAIN NATRIURETIC PEPTIDE - Abnormal; Notable for the following components:   B Natriuretic Peptide 617.2 (*)    All other components within normal limits  CBC - Abnormal; Notable for the following components:   Hemoglobin 11.1 (*)    MCHC 29.9 (*)    All other components within normal limits  SARS CORONAVIRUS 2 BY RT PCR (HOSPITAL ORDER, [place] [facility])  MAGNESIUM  D-DIMER, QUANTITATIVE (NOT AT [facility])  TROPONIN I (HIGH SENSITIVITY)  TROPONIN I (HIGH SENSITIVITY)    EKG EKG Interpretation  Date/Time:  Sunday February 11 2020 20:44:36 EDT Ventricular Rate:  60 PR Interval:    QRS Duration: 106 QT Interval:  469 QTC Calculation: 469 R Axis:   83 Text Interpretation: probable sinus rhythm Borderline right axis deviation Borderline low voltage, extremity leads Nonspecific T abnormalities, lateral leads Confirmed by Dorie Rank [phone]) on 02/11/2020 9:13:45 PM   Radiology DG  Chest Portable 1 View  Result Date: 02/11/2020 CLINICAL DATA:  Shortness of breath EXAM: PORTABLE CHEST  1 VIEW COMPARISON:  04/13/2018 FINDINGS: Cardiac shadow is enlarged. Defibrillator is again noted and stable. The overall inspiratory effort is poor with crowding of vascular markings. No focal infiltrate or effusion is seen. No acute bony abnormality is noted. IMPRESSION: Poor inspiratory effort.  No acute abnormality noted. Electronically Signed   By: Inez Catalina M.D.   On: 02/11/2020 20:00    Procedures .Critical Care Performed by: Dorie Rank, MD Authorized by: Dorie Rank, MD   Critical care provider statement:    Critical care time (minutes):  30   Critical care was time spent personally by me on the following activities:  Discussions with consultants, evaluation of patient's response to treatment, examination of patient, ordering and performing treatments and interventions, ordering and review of laboratory studies, ordering and review of radiographic studies, pulse oximetry, re-evaluation of patient's condition, obtaining history from patient or surrogate and review of old charts   (including critical care time)  Medications Ordered in ED Medications  nitroGLYCERIN (NITROGLYN) 2 % ointment 1 inch (1 inch Topical Given 02/11/20 2124)  furosemide (LASIX) injection 40 mg (40 mg Intravenous Given 02/11/20 2043)  potassium chloride SA (KLOR-CON) CR tablet 20 mEq (20 mEq Oral Given 02/11/20 2124)    ED Course  I have reviewed the triage vital signs and the nursing notes.  Pertinent labs & imaging results that were available during my care of the patient were reviewed by me and considered in my medical decision making (see chart for details).    MDM Rules/Calculators/A&P                          Patient presents the ED with complaints of shortness of breath.  Presentation was suggestive of CHF exacerbation.  She noticed increasing peripheral edema and orthopnea.  BNP is elevated however chest x-ray does not show any acute abnormalities.  Possible symptoms may be related to CHF but I will  order a d dimer to rule out PE.  Patient has been given nitroglycerin and Lasix.  She no longer requires a nonrebreather is now on a nasal cannula.  I will consult the medical service for admission and further treatment. Final Clinical Impression(s) / ED Diagnoses Final diagnoses:  Acute on chronic congestive heart failure, unspecified heart failure type [facility])     Dorie Rank, MD 02/11/20 2320

## 2020-02-11 NOTE — Consult Note (Signed)
Cardiology Consultation:   Patient ID: [name] MRN: [id number]; DOB: 30-Aug-1940  Admit date: 02/11/2020 Date of Consult: 02/11/2020  Primary Care Provider: Girtha Rm, NP-C Primary Cardiologist: Sanda Klein, MD  Primary Electrophysiologist:  None    Patient Profile:   [name] is a 79 y.o. female with a hx of  NICM EF 45% by recent echo, minor nonobstructive CAD by cath 2015, severe PHTN, OSA, restrictive lung disease, HTN, HLD, DM2 who is being seen today for the evaluation of SOB at the request of Dorie Rank, MD.  History of Present Illness:   [name] is a 79 y.o. female with a hx of nonischemic cardiomyopathy (previous EF 35%, most recent EF 45%), minor nonobstructive coronary artery disease (last heart cath December 2015), status post defibrillator ([employer]tronic [place] dual-chamber, implanted 2013, Sprint [place] 6949-lead under advisory) severe pulmonary hypertension, obstructive sleep apnea, restrictive lung disease, essential hypertension, hypercholesterolemia type 2 diabetes mellitus, which is currently diet controlled.  She follows with Dr. [name] and at last [place] in May 2021 she stated that she feels that her functional abilities have actually improved compared to a year ago.  She started to exercise a couple of days a week.  Continued to have NYHA functional class II exertional dyspnea and chronically sleeps on 2 pillows, no overt orthopnea.   Device check showed normal ICD function.  Estimated generator longevity is 2.8 years.  She had an episode of paroxysmal atrial tachycardia or possibly slow atrial flutter at about 200 bpm on May 2.  It lasted for about an hour and was asymptomatic.  There was mostly 3: 1 AV conduction with an average ventricular rate of about 80 bpm.  She is on high-dose carvedilol.  There was no true atrial fibrillation.  Similar short episodes of atrial tachycardia/flutter have been recorded in the past.  She has lost weight and no  longer takes medicines for diabetes and her most recent hemoglobin A1c was 6% in March 2021.  Her lipid profile from the same date showed an excellent HDL of 109 and LDL well within target range at 38.  Her creatinine has actually shown a stable or improving trend most recently creatinine of 1.51 (GFR 37).  She does have problems with lower extremity edema but she self manages this by adjusting her dose of diuretic.  If she develops weight gain and ankle swelling she increases her dose of furosemide from 40 mg daily to 80 mg daily.  She only takes the diuretic 6 days a week.  She has to increase the dose of diuretic to the higher dose no more than twice a week on the average.  She is compliant with daily weights and is quite knowledgeable about sodium restriction.  She estimates her "dry weight" on her home scale at about 163lbs as she has been having dental problems recently and has not been eating as much and has lost some weight.   Her most recent echocardiogram was performed in October 2016 and showed a left ventricular ejection fraction of 45% with a mildly dilated left ventricle measuring 5.3 cm in end diastole.  Mitral inflow showed a pattern of abnormal relaxation, consistent with the absence of elevated mean left atrial pressure, the E/e' prime ratio at the medial annulus was however elevated at 13.  The same echocardiogram also showed mild mitral insufficiency and mild to moderate tricuspid insufficiency and a mildly dilated left atrium.  The right atrium and right ventricle was described as normal.  There is a mention of mild aortic stenosis with a valve area calculated at 1.8 cm, but the gradients are quite normal (peak 10, mean 6 mmHg).  The most striking abnormality on the echo is severe pulmonary artery pressure with an estimated systolic pressure of 70 mmHg assuming a right atrial pressure of 7 mmHg.  There are no objective measurements to assess right ventricular systolic function but  qualitatively  the right ventricle is described as normal.    Her last cardiac catheterization was in December 2015 and described "mild plaquing and no obstructive CAD" in the left main and circumflex coronary artery, while the LAD and RCA were described as normal.  At that time the EF was 35-45% by ventriculography in the left ventricular end-diastolic pressure was 30 mmHg.  The mean pulmonary artery pressure was 25 mmHg.  The pulmonary artery pressure was 90/23 mmHg (with systemic blood pressure 168/66).  Carotid duplex ultrasound performed in October 2017 showed minor plaque in both carotids and antegrade flow in both vertebral arteries.  Since January 2018 she has had a grand total of 14 minutes of atrial mode switch.  Review of the electrogram showed these events to primarily be paroxysmal atrial tachycardia.  This is usually conducted with some degree of AV block so that there is no high ventricular rate.  There is one exception where she had 1: 1 AV conduction, which was misinterpreted by her device is representing nonsustained ventricular tachycardia.  Lead parameters are normal.  As mentioned her ventricular lead is a Sprint [employer] [id number] under advisory but shows no evidence of SIC.  She bears a diagnosis of restrictive lung disease and obstructive sleep apnea.  She is supposed to be on home oxygen.  She uses CPAP for severe obstructive sleep apnea with an apnea hypotony index of 59 and desaturations down to 80%.  Despite treatment with CPAP it has been very difficult to control her AHI.  She sees Dr. Halford Chessman or with Dr. Elsworth Soho in the pulmonary clinic.  The report stated that prior PFTs showed no obstruction but fairly significant restriction and reduced DLCO but repeat PFTs today were improved, CT chest was unremarkable and the feeling was that her initial PFTs may have not been accurate.  Most recent hemoglobin A1c is documented as being 6.1%, on glimepiride monotherapy.  She is not on statin  therapy, but her previous notes report that she was taking atorvastatin 40 mg daily and her LDL was 36.[name]  Her baseline creatinine seems to be 1.6-1.9.  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  She had a pulmonary embolism 40 years ago.  EGD performed in 2011 showed mild erosive esophagitis and gastritis.  She was prescribed Protonix.  She has a remote history of depression for which she is not currently receiving any medications.  She has never smoked and denies use of alcohol.    At her last OV with Dr. [name], he documented that her dry weight is 160-165lbs.  She has not been on RAAS inhibitors due to CKD and remains on high dose BB for suppression of atrial arrhythmias.  It is felt that possibly her severe PHTN is related to inadequately treated OSA.    Today her daughter talked with Dr. [name] due to LE edema to the knees and exertional SOB.  O2 sats were 89% without O2.  Her weight had increased from 163 to 170lbs over the past 2 days. She had increased  her dose of Lasix  from 58m daily to 1274mdaily for 2 days without any improvement.  Weight increased 2lbs in the past 2 days and BP running high at 156/8197m.  Metolazone 2.5mg26mded today.  Her daughter then called Dr. AchaMargaretann Loveless the daughter works with as her nurse and stated that the patient was really having problems with breathing and she brought her to the ER.  In ER initial O2 sat 100% on NRB on 15L.  BP currently 137/77mm74m Cxray showed no acute abnormality. BNp elevated at 617 and hsTrop normal at 17 and 18. Creatinine slightly elevated at 1.8 (Scr 1.65 in Sept 2020).  Cardiology is now asked to consult.  She denies any chest pain or pressure.  Her LE edema has increased over the past few days along with SOB.     Past Medical History:  Diagnosis Date  . Anemia   . CAD (coronary artery disease)   . Cardiac defibrillator in situ   . Cataract    bilateral 2017  . CHF (congestive heart failure)  (HCC) [place] Chronic systolic heart failure (HCC) [place] CKD (chronic kidney disease) stage 4, GFR 15-29 ml/min (HCC)   . COPD (chronic obstructive pulmonary disease) (HCC) [place] HTN (hypertension)   . Malignant neoplasm of rectum (HCC) [place]0/2019   Dr. PayneRollene RotundailmiRedland notes she was due for colonoscopy in 12/2016  . OSA on CPAP 09/08/2017  . Other cardiomyopathies (HCC) [place] Oxygen deficiency    2 liters at night  . Pulmonary HTN (HCC) [place] Sleep apnea    uses c-pap  . Stroke (HCC) [place] Type 2 diabetes, controlled, with peripheral neuropathy (HCC) [place] Urge incontinence of urine     Past Surgical History:  Procedure Laterality Date  . BIOPSY  03/29/2018   Procedure: BIOPSY;  Surgeon: ArmbrYetta Flock  Location: [facility];  Service: Gastroenterology;;  . COLONOSCOPY    . COLONOSCOPY WITH PROPOFOL N/A 03/29/2018   Procedure: COLONOSCOPY WITH PROPOFOL;  Surgeon: ArmbrYetta Flock  Location: [facility];  Service: Gastroenterology;  Laterality: N/A;  . ESOPHAGOGASTRODUODENOSCOPY (EGD) WITH PROPOFOL N/A 03/29/2018   Procedure: ESOPHAGOGASTRODUODENOSCOPY (EGD) WITH PROPOFOL;  Surgeon: ArmbrYetta Flock  Location: [facility];  Service: Gastroenterology;  Laterality: N/A;  . POLYPECTOMY  03/29/2018   Procedure: POLYPECTOMY;  Surgeon: ArmbrYetta Flock  Location: [facility];  Service: Gastroenterology;;  . TONSILLECTOMY AND ADENOIDECTOMY     age 73  .77UBAL LIGATION     years ago  . UPPER GASTROINTESTINAL ENDOSCOPY    . UPPER GI ENDOSCOPY  11/28/2009   mild erosive esophagitis, mild erosive changes in stomach, chronic constipation     Home Medications:  Prior to Admission medications   Medication Sig Start Date End Date Taking? Authorizing Provider  ACCU-CHEK AVIVA PLUS test strip USE  TO TEST BLOOD SUGAR ONE TIME DAILY 02/08/20   HensoHarland DingwallP-C  Accu-Chek Softclix Lancets lancets USE  AS  INSTRUCTED 06/26/19   Henson, Vickie L, NP-C  Alcohol  Swabs (B-D SINGLE USE SWABS REGULAR) PADS USE  TO TEST BLOOD SUGAR ONE TIME DAILY 07/27/19   Henson, Vickie L, NP-C  amLODipine (NORVASC) 10 MG tablet TAKE 1 TABLET EVERY DAY 01/16/20   Henson, Vickie L, NP-C  aspirin 81 MG tablet Take 81 mg by mouth at bedtime.     [provider]  atorvastatin (LIPITOR) 40 MG tablet TAKE 1 TABLET EVERY  DAY 01/16/20   Henson, Vickie L, NP-C  carvedilol (COREG) 25 MG tablet TAKE 1 TABLET TWICE DAILY WITH A MEAL 01/24/19   Henson, Vickie L, NP-C  ferrous sulfate 325 (65 FE) MG tablet Take 325 mg by mouth 2 (two) times daily with a meal.    [provider]  furosemide (LASIX) 40 MG tablet TAKE 1 TABLET TWICE DAILY 01/24/19   Henson, Vickie L, NP-C  metolazone (ZAROXOLYN) 2.5 MG tablet Take 1 tablet (2.5 mg total) by mouth daily as needed. 02/11/20 05/11/20  Croitoru, Mihai, MD  MYRBETRIQ 50 MG TB24 tablet TAKE 1 TABLET EVERY DAY 10/25/19   Girtha Rm, NP-C  NON FORMULARY [facility]  Onychomycosis Nail Lacquer -  Fluconazole 2%, Terbinafine 1% DMSO Apply to affected nail once daily Qty. 120 gm 3 refills    [provider]  OXYGEN Inhale 2 L into the lungs at bedtime.     [provider]  potassium chloride (KLOR-CON) 10 MEQ tablet Take 1 tablet (10 mEq total) by mouth daily as needed. 02/11/20 05/11/20  Croitoru, Mihai, MD  VITAMIN D, CHOLECALCIFEROL, PO Take by mouth.    [provider]    Inpatient Medications: Scheduled Meds: . nitroGLYCERIN  1 inch Topical Q6H  . potassium chloride  20 mEq Oral Once   Continuous Infusions:  PRN Meds:   Allergies:    Allergies  Allergen Reactions  . Benazepril Hcl   . Lotrel [Amlodipine Besy-Benazepril Hcl]   . Talwin [Pentazocine]     Hallulate    Social History:   Social History   Socioeconomic History  . Marital status: Widowed    Spouse name: Not on file  . Number of children: Not on file  . Years of education: Not on file  . Highest education level: Not  on file  Occupational History  . Not on file  Tobacco Use  . Smoking status: Former Smoker    Quit date: 03/14/2009    Years since quitting: 10.9  . Smokeless tobacco: Never Used  . Tobacco comment: quit smoking 2010  Substance and Sexual Activity  . Alcohol use: No  . Drug use: No  . Sexual activity: Not on file  Other Topics Concern  . Not on file  Social History Narrative   Lives alone.  2 daughters and 1 granddaughter in [place]   Social Determinants of Health   Financial Resource Strain:   . Difficulty of Paying Living Expenses:   Food Insecurity:   . Worried About Charity fundraiser in the Last Year:   . Arboriculturist in the Last Year:   Transportation Needs:   . Film/video editor (Medical):   [name] Lack of Transportation (Non-Medical):   Physical Activity:   . Days of Exercise per Week:   . Minutes of Exercise per Session:   Stress:   . Feeling of Stress :   Social Connections:   . Frequency of Communication with Friends and Family:   . Frequency of Social Gatherings with Friends and Family:   . Attends Religious Services:   . Active Member of Clubs or Organizations:   . Attends Archivist Meetings:   [name] Marital Status:   Intimate Partner Violence:   . Fear of Current or Ex-Partner:   . Emotionally Abused:   [name] Physically Abused:   . Sexually Abused:     Family History:    Family History  Problem Relation Age of Onset  . Colon  polyps Daughter   . Colon cancer Neg Hx   . Esophageal cancer Neg Hx   . Rectal cancer Neg Hx   . Stomach cancer Neg Hx      ROS:  Please see the history of present illness.   All other ROS reviewed and negative.     Physical Exam/Data:   Vitals:   02/11/20 1923 02/11/20 2002 02/11/20 2055  BP: (!) 147/64 95/84 (!) 137/77  Pulse: 59    Resp: _0 Temp: 97.9 F (36.6 C)    TempSrc: Oral    SpO2: 100%     No intake or output data in the 24 hours ending 02/11/20 2119 Last 3 Weights 02/02/2020 11/27/2019  10/04/2019  Weight (lbs) 167 lb 9.6 oz 163 lb 163 lb  Weight (kg) 76.023 kg 73.936 kg 73.936 kg     There is no height or weight on file to calculate BMI.  General:  Well nourished, well developed, in no acute distress HEENT: normal Lymph: no adenopathy Neck: no JVD Endocrine:  No thryomegaly Vascular: No carotid bruits; FA pulses 2+ bilaterally without bruits  Cardiac:  normal S1, S2; RRR; no murmur  Lungs:  Crackles at bases bilaterally Abd: soft, nontender, no hepatomegaly  Ext: 1+ edema bilaterally Musculoskeletal:  No deformities, BUE and BLE strength normal and equal Skin: warm and dry  Neuro:  CNs 2-12 intact, no focal abnormalities noted Psych:  Normal affect   EKG:  The EKG was personally reviewed and demonstrates:  Probable NSR with PVCs but tracing is very poor quality Telemetry:  Telemetry was personally reviewed and demonstrates:  NSR  Relevant CV Studies: none  Laboratory Data:  High Sensitivity Troponin:  No results for input(s): TROPONINIHS in the last 720 hours.   ChemistryNo results for input(s): NA, K, CL, CO2, GLUCOSE, BUN, CREATININE, CALCIUM, GFRNONAA, GFRAA, ANIONGAP in the last 168 hours.  No results for input(s): PROT, ALBUMIN, AST, ALT, ALKPHOS, BILITOT in the last 168 hours. Hematology Recent Labs  Lab 02/11/20 2014  WBC 4.0  RBC 4.12  HGB 11.1*  HCT 37.1  MCV 90.0  MCH 26.9  MCHC 29.9*  RDW 15.5  PLT 205   BNPNo results for input(s): BNP, PROBNP in the last 168 hours.  DDimer No results for input(s): DDIMER in the last 168 hours.   Radiology/Studies:  DG Chest Portable 1 View  Result Date: 02/11/2020 CLINICAL DATA:  Shortness of breath EXAM: PORTABLE CHEST 1 VIEW COMPARISON:  04/13/2018 FINDINGS: Cardiac shadow is enlarged. Defibrillator is again noted and stable. The overall inspiratory effort is poor with crowding of vascular markings. No focal infiltrate or effusion is seen. No acute bony abnormality is noted. IMPRESSION: Poor  inspiratory effort.  No acute abnormality noted. Electronically Signed   By: Inez Catalina M.D.   On: 02/11/2020 20:00       Assessment and Plan:   1. Acute on chronic respiratory Failure with hypoxia -her chest xray appears clear -BNP mildly elevated at 617 -she has had some weight gain in the past 2 days of 2lbs. -LE edema has worsened and now up to her knees but already improved after IV lasix in ER according to her daughter -O2 sats 89% on 2L  -recommend Pulmonary/CCM admit patient as likely her underlying chronic pulmonary issues are playing a role -suspect that she has acute on chronic CHF but mainly right sided  2.  Chronic combined systolic/diastolic CHF -NYHA Class II heart failure at last OV in May  but appears to be Class 3-4 at this time -2D echo EF 45% by echo 2016 with severe PHTN and mild AS -she has had 2lb weight gain in past 2 days despite increased doses of diuretics and now worsening LE edema -Cxray is normal so ? Whether underlying pulmonary issue main problem with resulting worsening hypoxia and acute right sided heart failure -may be exacerbated by suboptimal BP control -will start lasix 82m IV BID -follow strict I&O's, daily weights and renal function -repeat echo since there has not been an assessment since 2016  3.  Pulmonary HT[place] the [place] of [place] her pulmonary artery wedge pressure was moderately increased, she had a very high transpulmonary gradient suggesting that the major cause of the pulmonary hypertension is fixed arteriolar disease. -pulmonary function tests appeared to be very abnormal when initially evaluated, follow-up PFTs were only mildly abnormal.   -It appears that the dominant reason for her severe pulmonary hypertension might be insufficiently treated obstructive sleep apnea.   -repeat echo to assess PAP and RV size and RVF -may benefit from changing diuretic to  Torsemide for getter absorption  4.  PAT -she is in NSR this admit -continue Carvedilol  5.  HTN -BP poorly controlled -continue Carvedilol 215mBID, amlodipine 1060maily -avoid ACEI/ARB due to CKD -consider addition of Hydralazine -currently NTPaste added  6.  CKD stage 3B -Creatinine this admit is up to 1.80 -follow closely with diuresis  7.  ASCAD -minor plaque on last cath in 2016 -no anginal sx -continue ASA, statin  and BB       For questions or updates, please contact [facility] consult [email or web address] for contact info under     Signed, TraFransico HimD  02/11/2020 9:19 PM

## 2020-02-11 NOTE — Progress Notes (Addendum)
Received a call from the patient's daughter. Her mother has developed edema almost to the knees and has exertional dyspnea. O2 sat 89% without O2 supplementation. Doubling the furosemide dose had no effect. Her weight has increased further, by 2 lb in the last 2 days. Her BP is high 156/81 mm Hg. Added metolazone 2.5 mg today and KCl 20 mEq daily. Will check back in the morning and adjust treatment according to her response to diuretics. Office followup and labs early next week.

## 2020-02-12 ENCOUNTER — Inpatient Hospital Stay (HOSPITAL_COMMUNITY): Payer: Medicare PPO

## 2020-02-12 ENCOUNTER — Observation Stay (HOSPITAL_COMMUNITY): Payer: Medicare PPO

## 2020-02-12 DIAGNOSIS — I5023 Acute on chronic systolic (congestive) heart failure: Secondary | ICD-10-CM | POA: Diagnosis not present

## 2020-02-12 DIAGNOSIS — K5909 Other constipation: Secondary | ICD-10-CM | POA: Diagnosis present

## 2020-02-12 DIAGNOSIS — J449 Chronic obstructive pulmonary disease, unspecified: Secondary | ICD-10-CM | POA: Diagnosis not present

## 2020-02-12 DIAGNOSIS — I5043 Acute on chronic combined systolic (congestive) and diastolic (congestive) heart failure: Secondary | ICD-10-CM | POA: Diagnosis present

## 2020-02-12 DIAGNOSIS — I472 Ventricular tachycardia: Secondary | ICD-10-CM | POA: Diagnosis present

## 2020-02-12 DIAGNOSIS — I509 Heart failure, unspecified: Secondary | ICD-10-CM

## 2020-02-12 DIAGNOSIS — E119 Type 2 diabetes mellitus without complications: Secondary | ICD-10-CM

## 2020-02-12 DIAGNOSIS — I2721 Secondary pulmonary arterial hypertension: Secondary | ICD-10-CM | POA: Diagnosis present

## 2020-02-12 DIAGNOSIS — J9621 Acute and chronic respiratory failure with hypoxia: Secondary | ICD-10-CM | POA: Diagnosis present

## 2020-02-12 DIAGNOSIS — J9611 Chronic respiratory failure with hypoxia: Secondary | ICD-10-CM | POA: Diagnosis present

## 2020-02-12 DIAGNOSIS — Z9989 Dependence on other enabling machines and devices: Secondary | ICD-10-CM

## 2020-02-12 DIAGNOSIS — Z20822 Contact with and (suspected) exposure to covid-19: Secondary | ICD-10-CM | POA: Diagnosis present

## 2020-02-12 DIAGNOSIS — N184 Chronic kidney disease, stage 4 (severe): Secondary | ICD-10-CM | POA: Diagnosis present

## 2020-02-12 DIAGNOSIS — I2781 Cor pulmonale (chronic): Secondary | ICD-10-CM | POA: Diagnosis present

## 2020-02-12 DIAGNOSIS — I5082 Biventricular heart failure: Secondary | ICD-10-CM | POA: Diagnosis present

## 2020-02-12 DIAGNOSIS — I471 Supraventricular tachycardia: Secondary | ICD-10-CM

## 2020-02-12 DIAGNOSIS — N179 Acute kidney failure, unspecified: Secondary | ICD-10-CM | POA: Diagnosis present

## 2020-02-12 DIAGNOSIS — G4733 Obstructive sleep apnea (adult) (pediatric): Secondary | ICD-10-CM | POA: Diagnosis present

## 2020-02-12 DIAGNOSIS — J9601 Acute respiratory failure with hypoxia: Secondary | ICD-10-CM | POA: Diagnosis not present

## 2020-02-12 DIAGNOSIS — I4892 Unspecified atrial flutter: Secondary | ICD-10-CM | POA: Diagnosis not present

## 2020-02-12 DIAGNOSIS — E1122 Type 2 diabetes mellitus with diabetic chronic kidney disease: Secondary | ICD-10-CM | POA: Diagnosis present

## 2020-02-12 DIAGNOSIS — Z95810 Presence of automatic (implantable) cardiac defibrillator: Secondary | ICD-10-CM

## 2020-02-12 DIAGNOSIS — I428 Other cardiomyopathies: Secondary | ICD-10-CM | POA: Diagnosis present

## 2020-02-12 DIAGNOSIS — Z789 Other specified health status: Secondary | ICD-10-CM

## 2020-02-12 DIAGNOSIS — J441 Chronic obstructive pulmonary disease with (acute) exacerbation: Secondary | ICD-10-CM | POA: Diagnosis present

## 2020-02-12 DIAGNOSIS — I442 Atrioventricular block, complete: Secondary | ICD-10-CM | POA: Diagnosis present

## 2020-02-12 DIAGNOSIS — E785 Hyperlipidemia, unspecified: Secondary | ICD-10-CM | POA: Diagnosis present

## 2020-02-12 DIAGNOSIS — E7800 Pure hypercholesterolemia, unspecified: Secondary | ICD-10-CM | POA: Diagnosis present

## 2020-02-12 DIAGNOSIS — E1165 Type 2 diabetes mellitus with hyperglycemia: Secondary | ICD-10-CM | POA: Diagnosis not present

## 2020-02-12 DIAGNOSIS — I2510 Atherosclerotic heart disease of native coronary artery without angina pectoris: Secondary | ICD-10-CM | POA: Diagnosis present

## 2020-02-12 DIAGNOSIS — I130 Hypertensive heart and chronic kidney disease with heart failure and stage 1 through stage 4 chronic kidney disease, or unspecified chronic kidney disease: Secondary | ICD-10-CM | POA: Diagnosis present

## 2020-02-12 DIAGNOSIS — J9622 Acute and chronic respiratory failure with hypercapnia: Secondary | ICD-10-CM | POA: Diagnosis present

## 2020-02-12 LAB — ECHOCARDIOGRAM COMPLETE
AR max vel: 1.73 cm2
AV Area VTI: 1.27 cm2
AV Area mean vel: 1.6 cm2
AV Mean grad: 10 mmHg
AV Peak grad: 15.2 mmHg
Ao pk vel: 1.95 m/s
Area-P 1/2: 3.08 cm2
S' Lateral: 3.6 cm

## 2020-02-12 LAB — D-DIMER, QUANTITATIVE: D-Dimer, Quant: 1.07 ug/mL-FEU — ABNORMAL HIGH (ref 0.00–0.50)

## 2020-02-12 LAB — SARS CORONAVIRUS 2 BY RT PCR (HOSPITAL ORDER, PERFORMED IN ~~LOC~~ HOSPITAL LAB): SARS Coronavirus 2: NEGATIVE

## 2020-02-12 MED ORDER — HYDRALAZINE HCL 25 MG PO TABS
25.0000 mg | ORAL_TABLET | Freq: Three times a day (TID) | ORAL | Status: DC
  Administered 2020-02-12 – 2020-02-18 (×16): 25 mg via ORAL
  Filled 2020-02-12 (×21): qty 1

## 2020-02-12 MED ORDER — TECHNETIUM TO 99M ALBUMIN AGGREGATED
3.6000 | Freq: Once | INTRAVENOUS | Status: AC | PRN
  Administered 2020-02-12: 3.6 via INTRAVENOUS

## 2020-02-12 MED ORDER — MIRABEGRON ER 50 MG PO TB24
50.0000 mg | ORAL_TABLET | Freq: Every day | ORAL | Status: DC
  Administered 2020-02-14 – 2020-02-16 (×3): 50 mg via ORAL
  Filled 2020-02-12 (×11): qty 1

## 2020-02-12 MED ORDER — CARVEDILOL 25 MG PO TABS
25.0000 mg | ORAL_TABLET | Freq: Two times a day (BID) | ORAL | Status: DC
  Administered 2020-02-12 – 2020-02-22 (×18): 25 mg via ORAL
  Filled 2020-02-12 (×11): qty 1
  Filled 2020-02-12: qty 2
  Filled 2020-02-12 (×8): qty 1

## 2020-02-12 MED ORDER — SODIUM CHLORIDE 0.9% FLUSH
3.0000 mL | Freq: Two times a day (BID) | INTRAVENOUS | Status: DC
  Administered 2020-02-12 – 2020-02-15 (×8): 3 mL via INTRAVENOUS

## 2020-02-12 MED ORDER — METHYLPREDNISOLONE SODIUM SUCC 40 MG IJ SOLR
40.0000 mg | Freq: Four times a day (QID) | INTRAMUSCULAR | Status: DC
  Administered 2020-02-12 – 2020-02-14 (×9): 40 mg via INTRAVENOUS
  Filled 2020-02-12 (×9): qty 1

## 2020-02-12 MED ORDER — ACETAMINOPHEN 325 MG PO TABS
650.0000 mg | ORAL_TABLET | ORAL | Status: DC | PRN
  Administered 2020-02-12 – 2020-02-19 (×6): 650 mg via ORAL
  Filled 2020-02-12 (×6): qty 2

## 2020-02-12 MED ORDER — IPRATROPIUM-ALBUTEROL 0.5-2.5 (3) MG/3ML IN SOLN
3.0000 mL | Freq: Four times a day (QID) | RESPIRATORY_TRACT | Status: DC
  Administered 2020-02-12 – 2020-02-13 (×7): 3 mL via RESPIRATORY_TRACT
  Filled 2020-02-12 (×7): qty 3

## 2020-02-12 MED ORDER — SODIUM CHLORIDE 0.9 % IV SOLN: 250.0000 mL | INTRAVENOUS | Status: DC | PRN

## 2020-02-12 MED ORDER — AMLODIPINE BESYLATE 10 MG PO TABS
10.0000 mg | ORAL_TABLET | Freq: Every day | ORAL | Status: DC
  Administered 2020-02-12 – 2020-02-22 (×9): 10 mg via ORAL
  Filled 2020-02-12 (×8): qty 1
  Filled 2020-02-12: qty 2
  Filled 2020-02-12 (×2): qty 1

## 2020-02-12 MED ORDER — ASPIRIN 81 MG PO CHEW
81.0000 mg | CHEWABLE_TABLET | Freq: Every day | ORAL | Status: DC
  Administered 2020-02-12 – 2020-02-21 (×11): 81 mg via ORAL
  Filled 2020-02-12 (×11): qty 1

## 2020-02-12 MED ORDER — FERROUS SULFATE 325 (65 FE) MG PO TABS
325.0000 mg | ORAL_TABLET | Freq: Every day | ORAL | Status: DC
  Administered 2020-02-12 – 2020-02-22 (×11): 325 mg via ORAL
  Filled 2020-02-12 (×11): qty 1

## 2020-02-12 MED ORDER — ENOXAPARIN SODIUM 30 MG/0.3ML ~~LOC~~ SOLN
30.0000 mg | SUBCUTANEOUS | Status: DC
  Administered 2020-02-12 – 2020-02-14 (×3): 30 mg via SUBCUTANEOUS
  Filled 2020-02-12 (×3): qty 0.3

## 2020-02-12 MED ORDER — ONDANSETRON HCL 4 MG/2ML IJ SOLN: 4.0000 mg | Freq: Four times a day (QID) | INTRAMUSCULAR | Status: DC | PRN

## 2020-02-12 MED ORDER — SODIUM CHLORIDE 0.9% FLUSH: 3.0000 mL | INTRAVENOUS | Status: DC | PRN

## 2020-02-12 MED ORDER — ATORVASTATIN CALCIUM 40 MG PO TABS
40.0000 mg | ORAL_TABLET | Freq: Every day | ORAL | Status: DC
  Administered 2020-02-12 – 2020-02-22 (×11): 40 mg via ORAL
  Filled 2020-02-12 (×11): qty 1

## 2020-02-12 MED ORDER — FUROSEMIDE 10 MG/ML IJ SOLN
40.0000 mg | Freq: Two times a day (BID) | INTRAMUSCULAR | Status: DC
  Administered 2020-02-12 – 2020-02-13 (×3): 40 mg via INTRAVENOUS
  Filled 2020-02-12 (×3): qty 4

## 2020-02-12 NOTE — Progress Notes (Signed)
Progress Note  Patient Name: [name] Date of Encounter: 02/12/2020  [facility] Cardiologist: Sanda Klein, MD   Subjective   Feels a little better and is able to speak in short sentences without stopping to catch her breath.  Still orthopneic.  Edema is a little improved with some wrinkling of the ankles. 550 mL in the urine collection bottle.  Input not recorded. O2 sat remains around 90% on 5 L oxygen.  Inpatient Medications    Scheduled Meds: . amLODipine  10 mg Oral Daily  . aspirin  81 mg Oral QHS  . atorvastatin  40 mg Oral Daily  . carvedilol  25 mg Oral BID WC  . enoxaparin (LOVENOX) injection  30 mg Subcutaneous Q24H  . ferrous sulfate  325 mg Oral Q breakfast  . furosemide  40 mg Intravenous BID  . hydrALAZINE  25 mg Oral Q8H  . mirabegron ER  50 mg Oral Daily  . nitroGLYCERIN  1 inch Topical Q6H  . sodium chloride flush  3 mL Intravenous Q12H   Continuous Infusions: . sodium chloride     PRN Meds: sodium chloride, acetaminophen, ondansetron (ZOFRAN) IV, sodium chloride flush   Vital Signs    Vitals:   02/12/20 0640 02/12/20 0645 02/12/20 0650 02/12/20 0655  BP: (!) 136/63 (!) 133/63 (!) 116/95 (!) 123/100  Pulse: 60 58 60 55  Resp: _0 Temp:      TempSrc:      SpO2: 90% (!) 89% (!) 88% (!) 88%   No intake or output data in the 24 hours ending 02/12/20 0801 Last 3 Weights 02/02/2020 11/27/2019 10/04/2019  Weight (lbs) 167 lb 9.6 oz 163 lb 163 lb  Weight (kg) 76.023 kg 73.936 kg 73.936 kg      Telemetry    Atrial paced, ventricular sensed rhythm with occasional PVCs- Personally Reviewed  ECG    Atrial paced, ventricular sensed rhythm with occasional PVCs- Personally Reviewed  Full ICD interrogation performed.  Confirms atrial paced, ventricular sensed rhythm  Physical Exam  Sitting straight upright in bed, but able to smile GEN: No acute distress.   Neck:  10-12 cm JVD, virtually to the earlobes Cardiac: RRR, 3/6  holosystolic murmur heard broadly across the precordium, loudest at the left lower sternal border, probably tricuspid regurgitation, probably a separate crescendo decrescendo 2/6 murmur of aortic stenosis and aortic focus, no diastolic murmurs or pericardial rubs, probable summation gallop is present.  Respiratory:  Crackles are heard in both bases, but no wheezing. GI: Soft, nontender, non-distended  MS:  2+ symmetrical pitting edema of the feet, ankles and lower two thirds of the shins; No deformity. Neuro:  Nonfocal  Psych: Normal affect   Labs    High Sensitivity Troponin:   Recent Labs  Lab 02/11/20 2014 02/11/20 2247  TROPONINIHS 17 18*      Chemistry Recent Labs  Lab 02/11/20 2014  NA 141  K 3.6  CL 103  CO2 30  GLUCOSE 132*  BUN 22  CREATININE 1.80*  CALCIUM 9.1  GFRNONAA 26*  GFRAA 30*  ANIONGAP 8     Hematology Recent Labs  Lab 02/11/20 2014  WBC 4.0  RBC 4.12  HGB 11.1*  HCT 37.1  MCV 90.0  MCH 26.9  MCHC 29.9*  RDW 15.5  PLT 205    BNP Recent Labs  Lab 02/11/20 2014  BNP 617.2*     DDimer  Recent Labs  Lab 02/12/20 0220  DDIMER 1.07*  Radiology    DG Chest Portable 1 View  Result Date: 02/11/2020 CLINICAL DATA:  Shortness of breath EXAM: PORTABLE CHEST 1 VIEW COMPARISON:  04/13/2018 FINDINGS: Cardiac shadow is enlarged. Defibrillator is again noted and stable. The overall inspiratory effort is poor with crowding of vascular markings. No focal infiltrate or effusion is seen. No acute bony abnormality is noted. IMPRESSION: Poor inspiratory effort.  No acute abnormality noted. Electronically Signed   By: Inez Catalina M.D.   On: 02/11/2020 20:00    Cardiac Studies   ICD interrogation performed by me today. Normal device function.  Estimated device longevity approximately 2 years.  Normal lead parameters (her Sprint Fidelis lead is under advisory but functioning normally).  Has about 85% atrial pacing and does not require ventricular  pacing.  There have been no episodes of atrial fibrillation or ventricular tachycardia since her last device check in May 2021.  Unfortunately her device does not have the capability to perform thoracic impedance monitoring (OptiVol).  Patient Profile     79 y.o. 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  Assessment & Plan    1.  CHF: Relatively mediocre diuresis, but some improvement since admission.  Still requiring relatively high concentration oxygen, although her chest x-ray findings are not particularly impressive.  Continue intravenous furosemide.  Monitor renal function carefully.  According to her daughter, [name], who is a Marine scientist as well, the fluid accumulation was gradual over a period of about 2 weeks as well as the slow worsening of her respiratory status.  Target "dry weight" is probably 160-163 pounds.  Repeat echo pending. 2.  PAH: Presumed secondary to chronic restrictive lung disease and obstructive sleep apnea, since it could not be entirely explained by elevation of left heart pressures for cardiac catheterization 2016. 3.  Acute on chronic cor pulmonale: Mildly elevated D-dimer.  She has a history of a remote pulmonary embolism that occurred more than 40 years ago.  Check a pulmonary perfusion scan to rule out pulmonary embolism.  Gradual onset of symptoms and weight gain appears more typical for heart failure exacerbation, despite the relatively clear chest x-ray. 4.  Chronic respiratory insufficiency on home O2 at night: Previous PFTs reportedly showed no obstruction but fairly significant restriction and markedly reduced DLCO.  She has  a history of obstructive sleep apnea with an AHI index of 59, that did not really respond to treatment with CPAP.  Note that she is not polycythemic. 5.  Paroxysmal atrial tachycardia: None detected by her defibrillator since last office visit in May.  She has had one episode of slow atrial flutter at about 200 bpm with 3: 1 AV conduction lasting for about an hour, in May 2021.  True atrial fibrillation has not been documented. 6.  HTN: On carvedilol 25 mg twice daily and maximum dose amlodipine.  Avoiding RAAS inhibitors due to her kidney problems.  Hydralazine added.  Receiving Nitropaste.  BiDil is a good long-term choice. 7.  CAD: Minor nonobstructive disease by previous heart catheterization.  She does not have angina pectoris and her troponin levels are essentially normal.  On statin and aspirin and beta-blocker.  Note exceptional HDL cholesterol over 100 and LDL in the 30s. 8.  CKD 3b:  Monitor creatinine daily with administration of diuretics.  Seems to be close to her baseline, previously documented at 1.6-1.9. 9.  DM:  Diet controlled currently, recent hemoglobin A1c 6.0% without medications.  Previously on sulfonylurea. 10. ICD: Normal device function ([employer] Sprint Fidelis 6949-lead under advisory).     For questions or updates, please contact [place] Please consult [email or web address] for contact info under        Signed, Sanda Klein, MD  02/12/2020, 8:01 AM

## 2020-02-12 NOTE — ED Notes (Signed)
Daughter notified about pt's room assignment on 3E.

## 2020-02-12 NOTE — ED Notes (Signed)
RT called about spo2 85-89% on 6lpm [place]. Will continue to monitor

## 2020-02-12 NOTE — H&P (Signed)
History and Physical    [name] ZTI:[id number] DOB: 08-Feb-1941 DOA: 02/11/2020  PCP: Girtha Rm, NP-C  Patient coming from: Home  I have personally briefly reviewed patient's old medical records in [place]  Chief Complaint: SOB  HPI: [name] is a 79 y.o. female with medical history significant of chronic combined CHF with EF 45% as of echo a couple of years ago, CKD 4, HTN, OSA on CPAP, DM2 as a chart diagnosis though shes not on any meds for this.  Pt has NICM, Pt has AICD.  Pt presented to ED with c/o SOB.  Associated peripheral edema and wt gain.  Also has associated orthopnea.  She has been taking all meds.  No fevers nor cough.  Symptoms onset this weekend and worsened today.   ED Course: BNP 617.2  Trop 17 and 18.  CXR neg.  WBC nl  Creat 1.8 (seems to be about baseline)   Review of Systems: As per HPI, otherwise all review of systems negative.  Past Medical History:  Diagnosis Date  . Anemia   . CAD (coronary artery disease)   . Cardiac defibrillator in situ   . Cataract    bilateral 2017  . CHF (congestive heart failure) ([place])   . Chronic systolic heart failure ([place])   . CKD (chronic kidney disease) stage 4, GFR 15-29 ml/min (HCC)   . COPD (chronic obstructive pulmonary disease) ([place])   . HTN (hypertension)   . 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 Rollene Rotunda in [place]. Per notes she was due for colonoscopy in 12/2016  . OSA on CPAP 09/08/2017  . Other cardiomyopathies ([place])   . Oxygen deficiency    2 liters at night  . Pulmonary HTN ([place])   . Sleep apnea    uses c-pap  . Stroke ([place])   . Type 2 diabetes, controlled, with peripheral neuropathy ([place])   . Urge incontinence of urine     Past Surgical History:  Procedure Laterality Date  . BIOPSY  03/29/2018   Procedure: BIOPSY;  Surgeon: Yetta Flock, MD;  Location: [facility];  Service: Gastroenterology;;  . COLONOSCOPY    . COLONOSCOPY WITH PROPOFOL  N/A 03/29/2018   Procedure: COLONOSCOPY WITH PROPOFOL;  Surgeon: Yetta Flock, MD;  Location: [facility];  Service: Gastroenterology;  Laterality: N/A;  . ESOPHAGOGASTRODUODENOSCOPY (EGD) WITH PROPOFOL N/A 03/29/2018   Procedure: ESOPHAGOGASTRODUODENOSCOPY (EGD) WITH PROPOFOL;  Surgeon: Yetta Flock, MD;  Location: [facility];  Service: Gastroenterology;  Laterality: N/A;  . POLYPECTOMY  03/29/2018   Procedure: POLYPECTOMY;  Surgeon: Yetta Flock, MD;  Location: [facility];  Service: Gastroenterology;;  . TONSILLECTOMY AND ADENOIDECTOMY     age 83  . TUBAL LIGATION     years ago  . UPPER GASTROINTESTINAL ENDOSCOPY    . UPPER GI ENDOSCOPY  11/28/2009   mild erosive esophagitis, mild erosive changes in stomach, chronic constipation     reports that she quit smoking about 10 years ago. She has never used smokeless tobacco. She reports that she does not drink alcohol and does not use drugs.  Allergies  Allergen Reactions  . Benazepril Hcl   . Lotrel [Amlodipine Besy-Benazepril Hcl]   . Talwin [Pentazocine]     Hallulate    Family History  Problem Relation Age of Onset  . Colon polyps Daughter   . Colon cancer Neg Hx   . Esophageal cancer Neg Hx   . Rectal cancer Neg Hx   .  Stomach cancer Neg Hx      Prior to Admission medications   Medication Sig Start Date End Date Taking? Authorizing Provider  amLODipine (NORVASC) 10 MG tablet TAKE 1 TABLET EVERY DAY Patient taking differently: Take 10 mg by mouth daily.  01/16/20  Yes Henson, Vickie L, NP-C  aspirin 81 MG tablet Take 81 mg by mouth at bedtime.    Yes [provider]  atorvastatin (LIPITOR) 40 MG tablet TAKE 1 TABLET EVERY DAY Patient taking differently: Take 40 mg by mouth daily.  01/16/20  Yes Henson, Vickie L, NP-C  carvedilol (COREG) 25 MG tablet TAKE 1 TABLET TWICE DAILY WITH A MEAL Patient taking differently: Take 25 mg by mouth 2 (two) times daily with a meal.  01/24/19  Yes Henson,  Vickie L, NP-C  ferrous sulfate 325 (65 FE) MG tablet Take 325 mg by mouth daily with breakfast.    Yes [provider]  furosemide (LASIX) 40 MG tablet TAKE 1 TABLET TWICE DAILY Patient taking differently: Take 40 mg by mouth daily.  01/24/19  Yes Henson, Vickie L, NP-C  MYRBETRIQ 50 MG TB24 tablet TAKE 1 TABLET EVERY DAY Patient taking differently: Take 50 mg by mouth daily.  10/25/19  Yes Henson, Vickie L, NP-C  VITAMIN D, CHOLECALCIFEROL, PO Take 1 tablet by mouth daily.    Yes [provider]  ACCU-CHEK AVIVA PLUS test strip USE  TO TEST BLOOD SUGAR ONE TIME DAILY 02/08/20   Harland Dingwall L, NP-C  Accu-Chek Softclix Lancets lancets USE  AS  INSTRUCTED 06/26/19   Henson, Vickie L, NP-C  Alcohol Swabs (B-D SINGLE USE SWABS REGULAR) PADS USE  TO TEST BLOOD SUGAR ONE TIME DAILY 07/27/19   Henson, Vickie L, NP-C  metolazone (ZAROXOLYN) 2.5 MG tablet Take 1 tablet (2.5 mg total) by mouth daily as needed. 02/11/20 05/11/20  Croitoru, Mihai, MD  OXYGEN Inhale 2 L into the lungs at bedtime.     [provider]  potassium chloride (KLOR-CON) 10 MEQ tablet Take 1 tablet (10 mEq total) by mouth daily as needed. 02/11/20 05/11/20  Croitoru, Dani Gobble, MD    Physical Exam: Vitals:   02/11/20 2115 02/11/20 2130 02/11/20 2135 02/11/20 2140  BP: (!) 157/119 (!) 158/90 (!) 164/95 (!) 160/118  Pulse:      Resp: 19 17    Temp:      TempSrc:      SpO2:        Constitutional: NAD, calm, comfortable Eyes: PERRL, lids and conjunctivae normal ENMT: Mucous membranes are moist. Posterior pharynx clear of any exudate or lesions.Normal dentition.  Neck: normal, supple, no masses, no thyromegaly Respiratory: clear to auscultation bilaterally, no wheezing, no crackles. Normal respiratory effort. No accessory muscle use.  Cardiovascular: Regular rate and rhythm, no murmurs / rubs / gallops. 2+ BLE edema 2+ pedal pulses. No carotid bruits.  Abdomen: no tenderness, no masses palpated. No  hepatosplenomegaly. Bowel sounds positive.  Musculoskeletal: no clubbing / cyanosis. No joint deformity upper and lower extremities. Good ROM, no contractures. Normal muscle tone.  Skin: no rashes, lesions, ulcers. No induration Neurologic: CN 2-12 grossly intact. Sensation intact, DTR normal. Strength 5/5 in all 4.  Psychiatric: Normal judgment and insight. Alert and oriented x 3. Normal mood.    Labs on Admission: I have personally reviewed following labs and imaging studies  CBC: Recent Labs  Lab 02/11/20 2014  WBC 4.0  HGB 11.1*  HCT 37.1  MCV 90.0  PLT 510   Basic Metabolic  Panel: Recent Labs  Lab 02/11/20 2014  NA 141  K 3.6  CL 103  CO2 30  GLUCOSE 132*  BUN 22  CREATININE 1.80*  CALCIUM 9.1  MG 2.0   GFR: Estimated Creatinine Clearance: 23.4 mL/min (A) (by C-G formula based on SCr of 1.8 mg/dL (H)). Liver Function Tests: No results for input(s): AST, ALT, ALKPHOS, BILITOT, PROT, ALBUMIN in the last 168 hours. No results for input(s): LIPASE, AMYLASE in the last 168 hours. No results for input(s): AMMONIA in the last 168 hours. Coagulation Profile: No results for input(s): INR, PROTIME in the last 168 hours. Cardiac Enzymes: No results for input(s): CKTOTAL, CKMB, CKMBINDEX, TROPONINI in the last 168 hours. BNP (last 3 results) No results for input(s): PROBNP in the last 8760 hours. HbA1C: No results for input(s): HGBA1C in the last 72 hours. CBG: No results for input(s): GLUCAP in the last 168 hours. Lipid Profile: No results for input(s): CHOL, HDL, LDLCALC, TRIG, CHOLHDL, LDLDIRECT in the last 72 hours. Thyroid Function Tests: No results for input(s): TSH, T4TOTAL, FREET4, T3FREE, THYROIDAB in the last 72 hours. Anemia Panel: No results for input(s): VITAMINB12, FOLATE, FERRITIN, TIBC, IRON, RETICCTPCT in the last 72 hours. Urine analysis:    Component Value Date/Time   COLORURINE YELLOW 07/05/2007 1115   APPEARANCEUR CLOUDY (A) 07/05/2007 1115    LABSPEC 1.015 08/25/2018 1448   PHURINE 8.0 07/05/2007 1115   GLUCOSEU NEGATIVE 07/05/2007 1115   HGBUR LARGE (A) 07/05/2007 1115   BILIRUBINUR negative 08/25/2018 1448   KETONESUR negative 08/25/2018 1448   KETONESUR NEGATIVE 07/05/2007 1115   PROTEINUR =30 (A) 08/25/2018 1448   PROTEINUR 30 (A) 07/05/2007 1115   UROBILINOGEN 4.0 (H) 07/05/2007 1115   NITRITE Negative 08/25/2018 1448   NITRITE NEGATIVE 07/05/2007 1115   LEUKOCYTESUR Negative 08/25/2018 1448    Radiological Exams on Admission: DG Chest Portable 1 View  Result Date: 02/11/2020 CLINICAL DATA:  Shortness of breath EXAM: PORTABLE CHEST 1 VIEW COMPARISON:  04/13/2018 FINDINGS: Cardiac shadow is enlarged. Defibrillator is again noted and stable. The overall inspiratory effort is poor with crowding of vascular markings. No focal infiltrate or effusion is seen. No acute bony abnormality is noted. IMPRESSION: Poor inspiratory effort.  No acute abnormality noted. Electronically Signed   By: Inez Catalina M.D.   On: 02/11/2020 20:00    EKG: Independently reviewed.  Assessment/Plan Principal Problem:   Acute respiratory failure with hypoxia (HCC) Active Problems:   Essential hypertension   COPD (chronic obstructive pulmonary disease) (HCC)   OSA on CPAP   CKD (chronic kidney disease) stage 4, GFR 15-29 ml/min (HCC)   Coronary artery disease involving native coronary artery of native heart without angina pectoris   Acute on chronic combined systolic and diastolic CHF (congestive heart failure) ([place])    1. Acute resp failure with hypoxia - 1. Pt with h/o severe PAH 2. ? CHF vs PAH as cause 3. Cont pulse ox 4. O2 via [place] 5. R/O PE: 1. D.Dimer pending 2. VQ scan if neg 2. CHF - Has some acute component with peripheral edema and wt gain, CXR unimpressive though 1. CHF pathway 2. Getting repeat echo since last echo was 2016 3. Lasix 68m IV BID for now 4. Strict intake and ouput 5. See cards consult note 1. Cards ? If  worsening PAH causing R HF 2. See also discussion of [place]. CKD 4 - 1. Daily BMP with diuresis 4. HTN - 1. Cont coreg and amlodipine 2. Adding hydralazine  90m PO Q8H for now 3. NTG paste on 5. CAD - 1. Cont ASA 2. Minor plaque on cath in 2016  DVT prophylaxis: Lovenox Code Status: Full Family Communication: Family at bedside Disposition Plan: Home after breathing improved Consults called: Cards consult in chart Admission status: Place in o17  Consuelo Suthers, JOsageHospitalists  How to contact the TE[facility] LLCAttending or Consulting provider 7[place] covering provider during after hours [place] for this patient?  1. Check the care team in [facility] LLCand look for a) attending/consulting TRH provider listed and b) the T[facility] Centerteam listed 2. Log into [email or web address]  Amion Physician Scheduling and messaging for groups and whole hospitals  On call and physician scheduling software for group practices, residents, hospitalists and other medical providers for call, clinic, rotation and shift schedules. OnCall Enterprise is a hospital-wide system for scheduling doctors and paging doctors on call. EasyPlot is for scientific plotting and data analysis.  [email or web address]  and use [place]'s universal password to access. If you do not have the password, please contact the hospital operator.  3. Locate the [facility] Center [facility] Hillprovider you are looking for under [facility] and page to a number that you can be directly reached. 4. If you still have difficulty reaching the provider, please page the [facility](Director on Call) for the Hospitalists listed on amion for assistance.  02/12/2020, 1:30 AM

## 2020-02-12 NOTE — Progress Notes (Signed)
Subjective: Patient admitted this morning, see detailed H&P by Dr. Alcario Drought 79 year old female with medical history of chronic combined systolic and diastolic CHF, CKD stage IV, hypertension, OSA on CPAP, diabetes mellitus type 2 came to ED with complaints of shortness of breath, lower extremity swelling and weight gain. Patient started on IV Lasix and diuresed well.  Cardiology has been consulted. She also has elevated D-dimer  Vitals:   02/12/20 1050 02/12/20 1055  BP: (!) 116/58 (!) 126/64  Pulse: 59 59  Resp: 20 18  Temp:    SpO2: 92% 91%      A/P Acute on chronic hypoxemic respiratory failure-likely from underlying CHF, patient has severe peripheral arterial hypertension.  She has underlying lung disease.  We will start her on Solu-Medrol 40 mg IV every 6 hours, DuoNeb nebulizers every 6 hours.  She has elevated D-dimer.  Will obtain VQ scan to rule out pulmonary embolism.  Acute CHF-started on IV Lasix 40 mg every 12 hours as per cardiology.  CKD stage IV-follow BMP in a.m.  Patient started on diuresis with Lasix.  Hypertension-continue Coreg, amlodipine    Oswald Hillock [facility] [phone]

## 2020-02-12 NOTE — ED Notes (Signed)
Admitting team at bedside

## 2020-02-12 NOTE — Progress Notes (Signed)
  Echocardiogram 2D Echocardiogram has been performed.  Jannett Celestine 02/12/2020, 10:40 AM

## 2020-02-12 NOTE — ED Notes (Signed)
Lunch Tray Ordered @ 3014.

## 2020-02-13 DIAGNOSIS — J449 Chronic obstructive pulmonary disease, unspecified: Secondary | ICD-10-CM

## 2020-02-13 DIAGNOSIS — I509 Heart failure, unspecified: Secondary | ICD-10-CM

## 2020-02-13 LAB — BASIC METABOLIC PANEL
Anion gap: 10 (ref 5–15)
BUN: 26 mg/dL — ABNORMAL HIGH (ref 8–23)
CO2: 27 mmol/L (ref 22–32)
Calcium: 9.1 mg/dL (ref 8.9–10.3)
Chloride: 104 mmol/L (ref 98–111)
Creatinine, Ser: 1.8 mg/dL — ABNORMAL HIGH (ref 0.44–1.00)
GFR calc Af Amer: 30 mL/min — ABNORMAL LOW (ref >60)
GFR calc non Af Amer: 26 mL/min — ABNORMAL LOW (ref >60)
Glucose, Bld: 203 mg/dL — ABNORMAL HIGH (ref 70–99)
Potassium: 3.5 mmol/L (ref 3.5–5.1)
Sodium: 141 mmol/L (ref 135–145)

## 2020-02-13 MED ORDER — IPRATROPIUM-ALBUTEROL 0.5-2.5 (3) MG/3ML IN SOLN
3.0000 mL | Freq: Three times a day (TID) | RESPIRATORY_TRACT | Status: DC
  Administered 2020-02-14 – 2020-02-15 (×4): 3 mL via RESPIRATORY_TRACT
  Filled 2020-02-13 (×3): qty 3

## 2020-02-13 MED ORDER — POTASSIUM CHLORIDE CRYS ER 20 MEQ PO TBCR
20.0000 meq | EXTENDED_RELEASE_TABLET | Freq: Every day | ORAL | Status: DC
  Administered 2020-02-13 – 2020-02-22 (×10): 20 meq via ORAL
  Filled 2020-02-13 (×10): qty 1

## 2020-02-13 MED ORDER — POLYETHYLENE GLYCOL 3350 17 G PO PACK
17.0000 g | PACK | Freq: Every day | ORAL | Status: DC | PRN
  Administered 2020-02-13 – 2020-02-17 (×2): 17 g via ORAL
  Filled 2020-02-13 (×2): qty 1

## 2020-02-13 MED ORDER — FUROSEMIDE 10 MG/ML IJ SOLN
80.0000 mg | Freq: Two times a day (BID) | INTRAMUSCULAR | Status: DC
  Administered 2020-02-13: 80 mg via INTRAVENOUS
  Filled 2020-02-13 (×2): qty 8

## 2020-02-13 NOTE — Progress Notes (Signed)
[facility]  PROGRESS NOTE  [name] DZH:[id number] DOB: 1940-11-21 DOA: 02/11/2020 PCP: Girtha Rm, NP-C   Brief HPI:    79 year old female with medical history of chronic combined systolic and diastolic CHF, CKD stage IV, hypertension, OSA on CPAP, diabetes mellitus type 2 came to ED with complaints of shortness of breath, lower extremity swelling and weight gain. Patient started on IV Lasix and diuresed well.  Cardiology was consulted    Subjective   Patient seen and examined, breathing better than yesterday, still requiring 6 to 7 L oxygen via nasal cannula.  Denies chest pain.   Assessment/Plan:     1. Acute on chronic hypoxemic respiratory failure-secondary to underlying CHF, severe pulmonary hypertension.  Patient has underlying lung disease.  Started on Solu-Medrol 40 mg IV every 6 hours, DuoNeb nebulizer every 6 hours,.  VQ scan obtained is negative for pulmonary embolism.  Patient has been started on IV Lasix as per cardiology. 2. Acute on chronic systolic heart failure-patient is currently on IV Lasix 80 mg every 12 hours.  Strict intake and output.  Daily BMP.  Cardiology following. 3. Hypertension-continue Coreg, amlodipine, hydralazine 4. Coronary artery disease-continue aspirin, atorvastatin 5. CKD stage IIIb-Creatinine is 1.80.  At baseline.  Follow BMP in a.m.  Patient is on high-dose Lasix as above.   Scheduled medications:   . amLODipine  10 mg Oral Daily  . aspirin  81 mg Oral QHS  . atorvastatin  40 mg Oral Daily  . carvedilol  25 mg Oral BID WC  . enoxaparin (LOVENOX) injection  30 mg Subcutaneous Q24H  . ferrous sulfate  325 mg Oral Q breakfast  . furosemide  80 mg Intravenous BID  . hydrALAZINE  25 mg Oral Q8H  . ipratropium-albuterol  3 mL Nebulization Q6H  . methylPREDNISolone (SOLU-MEDROL) injection  40 mg Intravenous Q6H  . mirabegron ER  50 mg Oral Daily  . nitroGLYCERIN  1 inch Topical Q6H  . potassium chloride  20 mEq Oral  Daily  . sodium chloride flush  3 mL Intravenous Q12H     SpO2: 93 % O2 Flow Rate (L/min): 9 L/min    CBC: Recent Labs  Lab 02/11/20 2014  WBC 4.0  HGB 11.1*  HCT 37.1  MCV 90.0  PLT 419    Basic Metabolic Panel: Recent Labs  Lab 02/11/20 2014 02/13/20 0439  NA 141 141  K 3.6 3.5  CL 103 104  CO2 30 27  GLUCOSE 132* 203*  BUN 22 26*  CREATININE 1.80* 1.80*  CALCIUM 9.1 9.1  MG 2.0  --     Antibiotics: Anti-infectives (From admission, onward)   None       DVT prophylaxis: Lovenox  Code Status: Full code  Family Communication: No family at bedside    Status is: Inpatient  Dispo: The patient is from: Home              Anticipated d/c is to: Home              Anticipated d/c date is: 02/16/2020              Patient currently not medically stable for discharge  Barrier to discharge-IV diuresis with Lasix.      Consultants:  Cardiology  Procedures:     Objective   Vitals:   02/13/20 1141 02/13/20 1259 02/13/20 1353 02/13/20 1406  BP: (!) 127/61 (!) 116/64  (!) 109/64  Pulse: 59 60  59  Resp:  16  Temp:  98.1 F (36.7 C)    TempSrc:  Oral    SpO2:  92% 93%   Weight:      Height:        Intake/Output Summary (Last 24 hours) at 02/13/2020 1447 Last data filed at 02/13/2020 1300 Gross per 24 hour  Intake 705 ml  Output 1250 ml  Net -545 ml    07/25 1901 - 07/27 0700 In: 225 [P.O.:222; I.V.:3] Out: 1150 [Urine:1150]  Filed Weights   02/12/20 1514 02/13/20 0350  Weight: 79.7 kg 80.4 kg    Physical Examination:    General: Appears in no acute distress  Cardiovascular: S1-S2, regular  Respiratory: Bibasilar crackles auscultated  Abdomen: Abdomen is soft, nontender, no organomegaly  Extremities: No edema in the lower extremities  Neurologic: No focal deficit noted    Data Reviewed:   Recent Results (from the past 240 hour(s))  SARS Coronavirus 2 by RT PCR (hospital order, performed in [place] [facility]  lab) Nasopharyngeal Nasopharyngeal Swab     Status: None   Collection Time: 02/12/20  1:55 AM   Specimen: Nasopharyngeal Swab  Result Value Ref Range Status   SARS Coronavirus 2 NEGATIVE NEGATIVE Final    Comment: (NOTE) SARS-CoV-2 target nucleic acids are NOT DETECTED.  The SARS-CoV-2 RNA is generally detectable in upper and lower respiratory specimens during the acute phase of infection. The lowest concentration of SARS-CoV-2 viral copies this assay can detect is 250 copies / mL. A negative result does not preclude SARS-CoV-2 infection and should not be used as the sole basis for treatment or other patient management decisions.  A negative result may occur with improper specimen collection / handling, submission of specimen other than nasopharyngeal swab, presence of viral mutation(s) within the areas targeted by this assay, and inadequate number of viral copies (<250 copies / mL). A negative result must be combined with clinical observations, patient history, and epidemiological information.  Fact Sheet for Patients:   StrictlyIdeas.no  Fact Sheet for Healthcare Providers: BankingDealers.co.za  This test is not yet approved or  cleared by the [place] FDA and has been authorized for detection and/or diagnosis of SARS-CoV-2 by FDA under an Emergency Use Authorization (EUA).  This EUA will remain in effect (meaning this test can be used) for the duration of the COVID-19 declaration under Section 564(b)(1) of the Act, 21 U.S.C. section 360bbb-3(b)(1), unless the authorization is terminated or revoked sooner.  Performed at [facility], [place] [address], [place], [place] [address]     No results for input(s): LIPASE, AMYLASE in the last 168 hours. No results for input(s): AMMONIA in the last 168 hours.  Cardiac Enzymes: No results for input(s): CKTOTAL, CKMB, CKMBINDEX, TROPONINI in the last 168 hours. BNP (last 3  results) Recent Labs    02/11/20 2014  BNP 617.2*    ProBNP (last 3 results) No results for input(s): PROBNP in the last 8760 hours.  Studies:  NM Pulmonary Perf and Vent  Result Date: 02/12/2020 CLINICAL DATA:  79 year old female with respiratory failure. EXAM: NUCLEAR MEDICINE PERFUSION LUNG SCAN TECHNIQUE: Perfusion images were obtained in multiple projections after intravenous injection of Tc-58mMAA. RADIOPHARMACEUTICALS:  3.6 mCi Tc921mAA IV COMPARISON:  Chest radiograph dated 02/11/2020. FINDINGS: Perfusion: No wedge shaped peripheral perfusion defects to suggest acute pulmonary embolism. IMPRESSION: Negative for pulmonary embolism. Electronically Signed   By: ArAnner Crete.D.   On: 02/12/2020 16:37   DG Chest Portable 1 View  Result Date: 02/11/2020  CLINICAL DATA:  Shortness of breath EXAM: PORTABLE CHEST 1 VIEW COMPARISON:  04/13/2018 FINDINGS: Cardiac shadow is enlarged. Defibrillator is again noted and stable. The overall inspiratory effort is poor with crowding of vascular markings. No focal infiltrate or effusion is seen. No acute bony abnormality is noted. IMPRESSION: Poor inspiratory effort.  No acute abnormality noted. Electronically Signed   By: Inez Catalina M.D.   On: 02/11/2020 20:00   ECHOCARDIOGRAM COMPLETE  Result Date: 02/12/2020    ECHOCARDIOGRAM REPORT   Patient Name:   CHAISE PASSARELLA Date of Exam: 02/12/2020 Medical Rec #:  [id number]       Height:       62.0 in Accession #:    [id number]      Weight:       167.6 lb Date of Birth:  Apr 03, 1941        BSA:          1.773 m Patient Age:    50 years        BP:           118/62 mmHg Patient Gender: F               HR:           64 bpm. Exam Location:  Inpatient Procedure: 2D Echo Indications:    CHF 428.31  History:        Patient has prior history of Echocardiogram examinations, most                 recent 05/13/2015. CHF and Cardiomyopathy, CAD, COPD and Stroke;                 Risk Factors:Hypertension and Diabetes.  Defibrillator. pulmonary                 hypertension.  Sonographer:    Jannett Celestine RDCS (AE) Referring Phys: Malin  1. Left ventricular ejection fraction, by estimation, is 45 to 50%. The left ventricle has mildly decreased function. The left ventricle demonstrates global hypokinesis. Left ventricular diastolic function could not be evaluated. There is the interventricular septum is flattened in systole and diastole, consistent with right ventricular pressure and volume overload.  2. Right ventricular systolic function is severely reduced. The right ventricular size is severely enlarged. There is moderately elevated pulmonary artery systolic pressure. The estimated right ventricular systolic pressure is 28.7 mmHg.  3. Right atrial size was severely dilated.  4. The mitral valve is grossly normal. Mild mitral valve regurgitation. No evidence of mitral stenosis.  5. The tricuspid valve is abnormal. 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  6. The aortic valve is tricuspid. Aortic valve regurgitation is not visualized. No aortic stenosis is present.  7. The inferior vena cava is normal in size with greater than 50% respiratory variability, suggesting right atrial pressure of 3 mmHg. Comparison(s): No prior Echocardiogram. Unable to view prior echocardiogram. Conclusion(s)/Recommendation(s): Findings consistent with Cor Pulmonale. FINDINGS  Left Ventricle: Left ventricular ejection fraction, by estimation, is 45 to 50%. The left ventricle has mildly decreased function. The left ventricle demonstrates global hypokinesis. The left ventricular internal cavity size was normal in size. There is  no left ventricular hypertrophy. The interventricular septum is flattened in systole and diastole, consistent with right ventricular pressure and volume overload. Left ventricular diastolic function could not be evaluated due to abnormal septal motion. Left ventricular diastolic function  could not be evaluated. Right Ventricle: The right ventricular size is  severely enlarged. No increase in right ventricular wall thickness. Right ventricular systolic function is severely reduced. There is moderately elevated pulmonary artery systolic pressure. The tricuspid regurgitant velocity is 3.05 m/s, and with an assumed right atrial pressure of 15 mmHg, the estimated right ventricular systolic pressure is 76.2 mmHg. Left Atrium: Left atrial size was normal in size. Right Atrium: Right atrial size was severely dilated. Pericardium: Trivial pericardial effusion is present. Mitral Valve: The mitral valve is grossly normal. Mild mitral valve regurgitation. No evidence of mitral valve stenosis. Tricuspid Valve: The tricuspid valve is abnormal. 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 Aortic Valve: The aortic valve is tricuspid. Aortic valve regurgitation is not visualized. No aortic stenosis is present. Aortic valve mean gradient measures 10.0 mmHg. Aortic valve peak gradient measures 15.2 mmHg. Aortic valve area, by VTI measures 1.27 cm. Pulmonic Valve: The pulmonic valve was grossly normal. Pulmonic valve regurgitation is mild. No evidence of pulmonic stenosis. Aorta: The aortic root is normal in size and structure. Venous: The inferior vena cava is normal in size with greater than 50% respiratory variability, suggesting right atrial pressure of 3 mmHg. IAS/Shunts: The atrial septum is grossly normal. Additional Comments: A pacer wire is visualized in the right atrium and right ventricle.  LEFT VENTRICLE PLAX 2D LVIDd:         5.10 cm  Diastology LVIDs:         3.60 cm  LV e' lateral:   12.00 cm/s LV PW:         0.90 cm  LV E/e' lateral: 5.4 LV IVS:        0.90 cm  LV e' medial:    5.55 cm/s LVOT diam:     2.20 cm  LV E/e' medial:  11.6 LV SV:         71 LV SV Index:   40 LVOT Area:     3.80 cm  LEFT ATRIUM           Index       RIGHT ATRIUM           Index LA diam:      5.00 cm 2.82 cm/m  RA  Area:     29.50 cm LA Vol (A4C): 32.7 ml 18.44 ml/m RA Volume:   102.00 ml 57.52 ml/m  AORTIC VALVE AV Area (Vmax):    1.73 cm AV Area (Vmean):   1.60 cm AV Area (VTI):     1.27 cm AV Vmax:           195.00 cm/s AV Vmean:          154.000 cm/s AV VTI:            0.558 m AV Peak Grad:      15.2 mmHg AV Mean Grad:      10.0 mmHg LVOT Vmax:         88.80 cm/s LVOT Vmean:        64.700 cm/s LVOT VTI:          0.186 m LVOT/AV VTI ratio: 0.33  AORTA Ao Root diam: 3.10 cm MITRAL VALVE               TRICUSPID VALVE MV Area (PHT): 3.08 cm    TR Peak grad:   37.2 mmHg MV Decel Time: 246 msec    TR Vmax:        305.00 cm/s MV E velocity: 64.30 cm/s MV A velocity: 66.80 cm/s  SHUNTS MV E/A  ratio:  0.96        Systemic VTI:  0.19 m                            Systemic Diam: 2.20 cm Eleonore Chiquito MD Electronically signed by Eleonore Chiquito MD Signature Date/Time: 02/12/2020/12:32:04 PM    Final        Oswald Hillock   [facility] If 7PM-7AM, please contact night-coverage at [email or web address], Office  [phone]   02/13/2020, 2:47 PM  LOS: 1 day

## 2020-02-13 NOTE — Progress Notes (Signed)
Progress Note  Patient Name: [name] Date of Encounter: 02/13/2020  [facility] Cardiologist: Sanda Klein, MD   Subjective   Breathing clearly improved. Was able to lie flat for a large part of the night. Still on oxygen at 8 L with saturation 92%. Lower extremity edema has completely resolved, but weight is still substantially higher than previously estimated "dry weight" of about 163 pounds. Creatinine stable at 1.8. No evidence of pulmonary embolism on perfusion scan. Echo shows striking findings of right ventricular volume and pressure overload. LVEF unchanged.  Inpatient Medications    Scheduled Meds: . amLODipine  10 mg Oral Daily  . aspirin  81 mg Oral QHS  . atorvastatin  40 mg Oral Daily  . carvedilol  25 mg Oral BID WC  . enoxaparin (LOVENOX) injection  30 mg Subcutaneous Q24H  . ferrous sulfate  325 mg Oral Q breakfast  . furosemide  40 mg Intravenous BID  . hydrALAZINE  25 mg Oral Q8H  . ipratropium-albuterol  3 mL Nebulization Q6H  . methylPREDNISolone (SOLU-MEDROL) injection  40 mg Intravenous Q6H  . mirabegron ER  50 mg Oral Daily  . nitroGLYCERIN  1 inch Topical Q6H  . potassium chloride  20 mEq Oral Daily  . sodium chloride flush  3 mL Intravenous Q12H   Continuous Infusions: . sodium chloride     PRN Meds: sodium chloride, acetaminophen, ondansetron (ZOFRAN) IV, sodium chloride flush   Vital Signs    Vitals:   02/13/20 0350 02/13/20 0616 02/13/20 0737 02/13/20 0755  BP: (!) 128/52 (!) 128/63 (!) 131/48   Pulse: 60  59   Resp: 19  16   Temp: 98.3 F (36.8 C)  98.8 F (37.1 C)   TempSrc: Oral  Oral   SpO2: 95%  90% 92%  Weight: 80.4 kg     Height:        Intake/Output Summary (Last 24 hours) at 02/13/2020 0843 Last data filed at 02/13/2020 0800 Gross per 24 hour  Intake 465 ml  Output 1500 ml  Net -1035 ml   Last 3 Weights 02/13/2020 02/12/2020 02/02/2020  Weight (lbs) 177 lb 4.8 oz 175 lb 11.3 oz 167 lb 9.6 oz  Weight (kg)  80.423 kg 79.7 kg 76.023 kg      Telemetry    Atrial paced, ventricular sensed rhythm- Personally Reviewed  ECG    No new tracing- Personally Reviewed  Physical Exam  Appears comfortable GEN: No acute distress.   Neck:  9-10 cm JVD, to the angle of the jaw Cardiac: RRR, 3/6 holosystolic murmur at the left lower sternal border with broad radiation, no diastolic murmurs, rubs, or gallops.  Respiratory: Clear to auscultation bilaterally. GI: Soft, nontender, non-distended  MS: No edema; No deformity. Neuro:  Nonfocal  Psych: Normal affect   Labs    High Sensitivity Troponin:   Recent Labs  Lab 02/11/20 2014 02/11/20 2247  TROPONINIHS 17 18*      Chemistry Recent Labs  Lab 02/11/20 2014 02/13/20 0439  NA 141 141  K 3.6 3.5  CL 103 104  CO2 30 27  GLUCOSE 132* 203*  BUN 22 26*  CREATININE 1.80* 1.80*  CALCIUM 9.1 9.1  GFRNONAA 26* 26*  GFRAA 30* 30*  ANIONGAP 8 10     Hematology Recent Labs  Lab 02/11/20 2014  WBC 4.0  RBC 4.12  HGB 11.1*  HCT 37.1  MCV 90.0  MCH 26.9  MCHC 29.9*  RDW 15.5  PLT 205  BNP Recent Labs  Lab 02/11/20 2014  BNP 617.2*     DDimer  Recent Labs  Lab 02/12/20 0220  DDIMER 1.07*     Radiology    NM Pulmonary Perf and Vent  Result Date: 02/12/2020 CLINICAL DATA:  79 year old female with respiratory failure. EXAM: NUCLEAR MEDICINE PERFUSION LUNG SCAN TECHNIQUE: Perfusion images were obtained in multiple projections after intravenous injection of Tc-58mMAA. RADIOPHARMACEUTICALS:  3.6 mCi Tc986mAA IV COMPARISON:  Chest radiograph dated 02/11/2020. FINDINGS: Perfusion: No wedge shaped peripheral perfusion defects to suggest acute pulmonary embolism. IMPRESSION: Negative for pulmonary embolism. Electronically Signed   By: ArAnner Crete.D.   On: 02/12/2020 16:37   DG Chest Portable 1 View  Result Date: 02/11/2020 CLINICAL DATA:  Shortness of breath EXAM: PORTABLE CHEST 1 VIEW COMPARISON:  04/13/2018 FINDINGS:  Cardiac shadow is enlarged. Defibrillator is again noted and stable. The overall inspiratory effort is poor with crowding of vascular markings. No focal infiltrate or effusion is seen. No acute bony abnormality is noted. IMPRESSION: Poor inspiratory effort.  No acute abnormality noted. Electronically Signed   By: MaInez Catalina.D.   On: 02/11/2020 20:00   ECHOCARDIOGRAM COMPLETE  Result Date: 02/12/2020    ECHOCARDIOGRAM REPORT   Patient Name:   Erin HALLERate of Exam: 02/12/2020 Medical Rec #:  [id number]     Height:       62.0 in Accession #:    [id number]    Weight:       167.6 lb Date of Birth:  [birth year]      BSA:          1.773 m Patient Age:    [age]        BP:           118/62 mmHg Patient Gender: F               HR:           64 bpm. Exam Location:  Inpatient Procedure: 2D Echo Indications:    CHF 428.31  History:        Patient has prior history of Echocardiogram examinations, most                 recent 05/13/2015. CHF and Cardiomyopathy, CAD, COPD and Stroke;                 Risk Factors:Hypertension and Diabetes. Defibrillator. pulmonary                 hypertension.  Sonographer:    ViJannett CelestineDCS (AE) Referring Phys: 48[place]1. Left ventricular ejection fraction, by estimation, is 45 to 50%. The left ventricle has mildly decreased function. The left ventricle demonstrates global hypokinesis. Left ventricular diastolic function could not be evaluated. There is the interventricular septum is flattened in systole and diastole, consistent with right ventricular pressure and volume overload.  2. Right ventricular systolic function is severely reduced. The right ventricular size is severely enlarged. There is moderately elevated pulmonary artery systolic pressure. The estimated right ventricular systolic pressure is 5230.0mHg.  3. Right atrial size was severely dilated.  4. The mitral valve is grossly normal. Mild mitral valve regurgitation. No evidence of  mitral stenosis.  5. The tricuspid valve is abnormal. 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  6. The aortic valve is tricuspid. Aortic valve regurgitation is not visualized. No aortic stenosis is present.  7. The inferior vena cava is normal in size with greater than 50% respiratory variability, suggesting right atrial pressure of 3 mmHg. Comparison(s): No prior Echocardiogram. Unable to view prior echocardiogram. Conclusion(s)/Recommendation(s): Findings consistent with Cor Pulmonale. FINDINGS  Left Ventricle: Left ventricular ejection fraction, by estimation, is 45 to 50%. The left ventricle has mildly decreased function. The left ventricle demonstrates global hypokinesis. The left ventricular internal cavity size was normal in size. There is  no left ventricular hypertrophy. The interventricular septum is flattened in systole and diastole, consistent with right ventricular pressure and volume overload. Left ventricular diastolic function could not be evaluated due to abnormal septal motion. Left ventricular diastolic function could not be evaluated. Right Ventricle: The right ventricular size is severely enlarged. No increase in right ventricular wall thickness. Right ventricular systolic function is severely reduced. There is moderately elevated pulmonary artery systolic pressure. The tricuspid regurgitant velocity is 3.05 m/s, and with an assumed right atrial pressure of 15 mmHg, the estimated right ventricular systolic pressure is 16.1 mmHg. Left Atrium: Left atrial size was normal in size. Right Atrium: Right atrial size was severely dilated. Pericardium: Trivial pericardial effusion is present. Mitral Valve: The mitral valve is grossly normal. Mild mitral valve regurgitation. No evidence of mitral valve stenosis. Tricuspid Valve: The tricuspid valve is abnormal. 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 Aortic Valve: The aortic valve is tricuspid. Aortic valve regurgitation is not  visualized. No aortic stenosis is present. Aortic valve mean gradient measures 10.0 mmHg. Aortic valve peak gradient measures 15.2 mmHg. Aortic valve area, by VTI measures 1.27 cm. Pulmonic Valve: The pulmonic valve was grossly normal. Pulmonic valve regurgitation is mild. No evidence of pulmonic stenosis. Aorta: The aortic root is normal in size and structure. Venous: The inferior vena cava is normal in size with greater than 50% respiratory variability, suggesting right atrial pressure of 3 mmHg. IAS/Shunts: The atrial septum is grossly normal. Additional Comments: A pacer wire is visualized in the right atrium and right ventricle.  LEFT VENTRICLE PLAX 2D LVIDd:         5.10 cm  Diastology LVIDs:         3.60 cm  LV e' lateral:   12.00 cm/s LV PW:         0.90 cm  LV E/e' lateral: 5.4 LV IVS:        0.90 cm  LV e' medial:    5.55 cm/s LVOT diam:     2.20 cm  LV E/e' medial:  11.6 LV SV:         71 LV SV Index:   40 LVOT Area:     3.80 cm  LEFT ATRIUM           Index       RIGHT ATRIUM           Index LA diam:      5.00 cm 2.82 cm/m  RA Area:     29.50 cm LA Vol (A4C): 32.7 ml 18.44 ml/m RA Volume:   102.00 ml 57.52 ml/m  AORTIC VALVE AV Area (Vmax):    1.73 cm AV Area (Vmean):   1.60 cm AV Area (VTI):     1.27 cm AV Vmax:           195.00 cm/s AV Vmean:          154.000 cm/s AV VTI:            0.558 m AV Peak Grad:  15.2 mmHg AV Mean Grad:      10.0 mmHg LVOT Vmax:         88.80 cm/s LVOT Vmean:        64.700 cm/s LVOT VTI:          0.186 m LVOT/AV VTI ratio: 0.33  AORTA Ao Root diam: 3.10 cm MITRAL VALVE               TRICUSPID VALVE MV Area (PHT): 3.08 cm    TR Peak grad:   37.2 mmHg MV Decel Time: 246 msec    TR Vmax:        305.00 cm/s MV E velocity: 64.30 cm/s MV A velocity: 66.80 cm/s  SHUNTS MV E/A ratio:  0.96        Systemic VTI:  0.19 m                            Systemic Diam: 2.20 cm Eleonore Chiquito MD Electronically signed by Eleonore Chiquito MD Signature Date/Time: 02/12/2020/12:32:04 PM     Final     Cardiac Studies    Echo 02/12/2020  1. Left ventricular ejection fraction, by estimation, is 45 to 50%. The  left ventricle has mildly decreased function. The left ventricle  demonstrates global hypokinesis. Left ventricular diastolic function could  not be evaluated. There is the  interventricular septum is flattened in systole and diastole, consistent  with right ventricular pressure and volume overload.  2. Right ventricular systolic function is severely reduced. The right  ventricular size is severely enlarged. There is moderately elevated  pulmonary artery systolic pressure. The estimated right ventricular  systolic pressure is 53.2 mmHg.  3. Right atrial size was severely dilated.  4. The mitral valve is grossly normal. Mild mitral valve regurgitation.  No evidence of mitral stenosis.  5. The tricuspid valve is abnormal. Tricuspid valve regurgitation is  moderate to severe.  6. The aortic valve is tricuspid. Aortic valve regurgitation is not  visualized. No aortic stenosis is present.  7. The inferior vena cava is normal in size with greater than 50%  respiratory variability, suggesting right atrial pressure of 3 mmHg.   Patient Profile     79 y.o. 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  Assessment & Plan    1.  CHF:  Positive, but slow progress with diuresis. Weight still substantially higher than her previous estimation of her "dry weight of 160-163 pounds.  Increase intravenous furosemide.  Unchanged LV systolic function. The echo continues to show prominent changes of chronic cor pulmonale. 2.  PAH:  Presumed secondary to chronic restrictive lung disease and obstructive sleep apnea, since it could not be entirely explained by elevation of left heart pressures for cardiac catheterization 2016. 3.  Acute on chronic cor pulmonale: Mildly elevated D-dimer, but the perfusion scan was negative for pulmonary embolism.  Gradual onset of symptoms and weight gain appears more typical for heart failure exacerbation, despite the relatively clear chest x-ray. 4.  Chronic respiratory insufficiency on home O2 at night: Previous PFTs reportedly showed no obstruction but fairly significant restriction and markedly reduced DLCO.   She reports compliance with CPAP, but her machine has been  broken for the last roughly 1 month. This may have contributed to the heart failure exacerbation. Her pulmonologist is Dr. Halford Chessman.   Note that she is not polycythemic. 5.  Paroxysmal atrial tachycardia: None detected by her defibrillator since last office visit in May.  She has had one episode of atrial tachycardia/slow atrial flutter at about 200 bpm with 3: 1 AV conduction lasting for about an hour, in May 2021.  True atrial fibrillation has not been documented. 6.  HTN: On carvedilol 25 mg twice daily and maximum dose amlodipine.  Avoiding RAAS inhibitors due to her kidney problems.  Hydralazine added.  Receiving Nitropaste.  BiDil is a good long-term choice. Note rather low diastolic blood pressure. 7.  CAD: Minor nonobstructive disease by previous heart catheterization.  She does not have angina pectoris and her troponin levels are essentially normal.  On statin and aspirin and beta-blocker.  Note exceptional HDL cholesterol over 100 and LDL in the 30s. 8.  CKD 3b:  Creatinine stable at 1.8. Potassium low normal. Add KCl supplement today.  Seems to be close to her baseline, previously documented at 1.6-1.9. 9.  DM:  Diet controlled currently, recent hemoglobin A1c 6.0% without medications.  Previously on sulfonylurea. 10. ICD: Normal  device function ([employer]idel[employer] 6949-lead under advisory).     For questions or updates, please contact [place] Please consult [email or web address] for contact info under        Signed, Sanda Klein, MD  02/13/2020, 8:43 AM

## 2020-02-13 NOTE — Evaluation (Signed)
Physical Therapy Evaluation Patient Details Name: [name] MRN: [id number] DOB: Nov 18, 1940 Today's Date: 02/13/2020   History of Present Illness  79 y.o. 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  Clinical Impression  Pt admitted with above diagnosis. Pt was able to ambulate with min assist and cues as she tends to flex slightly forward and needs cues for safety to stay close to RW and sequence steps and RW. Pt reports she has been struggling at home with activities.  Feel that a short SNF stay to build endurance would be helpful for pt. Pt and daughter agree. Pt currently with functional limitations due to the deficits listed below (see PT Problem List). Pt will benefit from skilled PT to increase their independence and safety with mobility to allow discharge to the venue listed below.      Follow Up Recommendations SNF;Supervision/Assistance - 24 hour    Equipment Recommendations  None recommended by PT    Recommendations for Other Services       Precautions / Restrictions Precautions Precautions: Fall Restrictions Weight Bearing Restrictions: No      Mobility  Bed Mobility Overal bed mobility: Needs Assistance Bed Mobility: Supine to Sit     Supine to sit: Min guard     General bed mobility comments: cues for technique and incr time to come to eOB  Transfers Overall transfer level: Needs assistance Equipment used: Rolling walker (2 wheeled) Transfers: Sit to/from Stand Sit to Stand: Min guard         General transfer comment: Pt was able to stand with min guard assist to RW wtih cues for hand  placement  Ambulation/Gait Ambulation/Gait assistance: Min guard;Min assist Gait Distance (Feet): 125 Feet Assistive device: Rolling walker (2 wheeled) Gait Pattern/deviations: Step-through pattern;Decreased stride length;Trunk flexed;Drifts right/left   Gait velocity interpretation: <1.31 ft/sec, indicative of household ambulator General Gait Details: Pt needed cues in uncontrolled environment to steer RW.  Cues to stay close to RW as well.  Pt on 8LO2 on arrival at 92%.  Pulse ox with activity 79-83% therefore incr to 15L with ambulation for sats 86% and >.  Sats returned to 90% about 3 min after rest in chair and decr O2 to 8L att rest.    Stairs            Wheelchair Mobility    Modified Rankin (Stroke Patients Only)       Balance Overall balance assessment: Needs assistance Sitting-balance support: No upper extremity supported;Feet supported Sitting balance-Leahy Scale: Fair     Standing balance support: During functional activity;Bilateral upper extremity supported Standing balance-Leahy Scale: Poor Standing balance comment: relies on UE support for balance                             Pertinent Vitals/Pain Pain Assessment: No/denies pain    Home Living Family/patient expects to be discharged to:: Private residence Living Arrangements: Alone Available Help at Discharge: Family;Available PRN/intermittently Type of Home: Apartment Home Access: Level entry     Home Layout: One level Home Equipment: Walker - 4 wheels;Cane - single point;Shower seat;Bedside commode;Wheelchair - manual (2LO2 nightime)      Prior Function Level of Independence:  Independent               Hand Dominance   Dominant Hand: Right    Extremity/Trunk Assessment   Upper Extremity Assessment Upper Extremity Assessment: Defer to OT evaluation    Lower Extremity Assessment Lower Extremity Assessment: Generalized weakness    Cervical / Trunk Assessment Cervical  / Trunk Assessment: Normal  Communication   Communication: No difficulties  Cognition Arousal/Alertness: Awake/alert Behavior During Therapy: WFL for tasks assessed/performed Overall Cognitive Status: Within Functional Limits for tasks assessed                                        General Comments General comments (skin integrity, edema, etc.): discussed energy conservation briefly    Exercises     Assessment/Plan    PT Assessment Patient needs continued PT services  PT Problem List Decreased activity tolerance;Decreased balance;Decreased mobility;Decreased knowledge of use of DME;Cardiopulmonary status limiting activity;Decreased knowledge of precautions       PT Treatment Interventions DME instruction;Gait training;Functional mobility training;Therapeutic activities;Therapeutic exercise;Balance training;Patient/family education    PT Goals (Current goals can be found in the Care Plan section)  Acute Rehab PT Goals Patient Stated Goal: to go get rehab and go home PT Goal Formulation: With patient Time For Goal Achievement: 02/27/20 Potential to Achieve Goals: Good    Frequency Min 2X/week   Barriers to discharge Decreased caregiver support      Co-evaluation               AM-PAC PT "6 Clicks" Mobility  Outcome Measure Help needed turning from your back to your side while in a flat bed without using bedrails?: None Help needed moving from lying on your back to sitting on the side of a flat bed without using bedrails?: None Help needed moving to and from a bed to a chair (including a wheelchair)?: A Little Help needed standing up from a chair using your arms (e.g., wheelchair or bedside chair)?: A Little Help needed to walk in hospital room?: A Little Help needed climbing 3-5 steps with a railing? : A Lot 6 Click Score: 19    End of Session Equipment Utilized During Treatment: Gait belt;Oxygen Activity Tolerance: Patient limited by  fatigue Patient left: in chair;with call bell/phone within reach;with family/visitor present Nurse Communication: Mobility status PT Visit Diagnosis: Muscle weakness (generalized) (M62.81)    Time: 9924-1551 PT Time Calculation (min) (ACUTE ONLY): 22 min   Charges:   PT Evaluation $PT Eval Moderate Complexity: 1 Mod          [name] W,PT Acute Rehabilitation Services Pager:  [phone]  Office:  [phone]    Denice Paradise 02/13/2020, 11:15 AM

## 2020-02-13 NOTE — Evaluation (Signed)
Occupational Therapy Evaluation Patient Details Name: [name] MRN: [id number] DOB: [birth year] Today's Date: 02/13/2020    History of Present Illness 79 y.o. 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   Clinical Impression   PTA patient was living alone in an apartment with PRN support and transportation provided by family. Patient reports independence with BADLs including bathing in sitting, and IADLs including meal prep and homemaking tasks. Patient currently functioning at [facility] guard to [place] A grossly for functional transfers and mobility with use of RW and Min A grossly for BADLs. Patient also limited by increased need for supplemental O2 with 8L at rest and 12L with activity with increased time for recovery during small bout of gait with RW in room this date. Patient would benefit from continued acute OT services in prep for safe d/c to next level of care with recommendation for SNF rehab.     Follow Up Recommendations  SNF;Supervision/Assistance - 24 hour    Equipment Recommendations  None recommended by OT    Recommendations for Other Services       Precautions / Restrictions Precautions Precautions: Fall Restrictions Weight Bearing Restrictions: No      Mobility Bed Mobility Overal bed mobility: Needs Assistance Bed Mobility: Supine to Sit     Supine to sit: Min guard     General bed mobility comments: Patient seated in recliner upon entry.   Transfers Overall transfer level: Needs assistance Equipment used: Rolling walker (2 wheeled) Transfers: Sit to/from Stand Sit to Stand: Min guard;Min assist          General transfer comment: Min guard from elevated surface and Min A from low recliner.     Balance Overall balance assessment: Needs assistance Sitting-balance support: No upper extremity supported;Feet supported Sitting balance-Leahy Scale: Fair     Standing balance support: During functional activity;Bilateral upper extremity supported Standing balance-Leahy Scale: Poor Standing balance comment: relies on UE support for balance                           ADL either performed or assessed with clinical judgement   ADL Overall ADL's : Needs assistance/impaired     Grooming: Set up;Sitting           Upper Body Dressing : Min guard;Sitting   Lower Body Dressing: Minimal assistance;Sitting/lateral leans;Sit to/from stand   Toilet Transfer: Minimal assistance;RW;Ambulation   Toileting- Clothing Manipulation and Hygiene: Minimal assistance;Sitting/lateral lean;Sit to/from stand       Functional mobility during ADLs: Min guard;Minimal assistance;Rolling walker       Vision Baseline Vision/History: Wears glasses Wears Glasses: Reading only Patient Visual Report: No change from baseline Vision Assessment?: No apparent visual deficits     Perception     Praxis      Pertinent Vitals/Pain       Hand Dominance Right   Extremity/Trunk Assessment Upper Extremity Assessment Upper Extremity Assessment: Defer to OT evaluation   Lower Extremity Assessment Lower Extremity Assessment: Generalized weakness   Cervical / Trunk Assessment Cervical / Trunk Assessment: Normal   Communication Communication Communication: No difficulties   Cognition Arousal/Alertness: Awake/alert Behavior During Therapy: WFL for tasks assessed/performed Overall Cognitive Status: Within Functional Limits  for tasks assessed                                     General Comments  discussed energy conservation briefly    Exercises     Shoulder Instructions       Home Living Family/patient expects to be discharged to:: Private residence Living Arrangements: Alone Available Help at Discharge: Family;Available PRN/intermittently Type of Home: Apartment Home Access: Level entry     Home Layout: One level     Bathroom Shower/Tub: Teacher, early years/pre: Standard     Home Equipment: Environmental consultant - 4 wheels;Cane - single point;Shower seat;Bedside commode;Wheelchair - manual (2LO2 nightime)          Prior Functioning/Environment Level of Independence: Independent        Comments: Independent with BADLs/IADLs including bathing, dressing, laundry, cooking, and cleaning. Patient also reports independence with med management without use of pill box. Use of SPC vs. rollator in community. No AD in home. Patient's daughter provided transporation.          OT Problem List: Decreased strength;Decreased activity tolerance;Impaired balance (sitting and/or standing);Decreased knowledge of use of DME or AE      OT Treatment/Interventions: Self-care/ADL training;Therapeutic exercise;Energy conservation;DME and/or AE instruction;Therapeutic activities;Patient/family education;Balance training    OT Goals(Current goals can be found in the care plan section) Acute Rehab OT Goals Patient Stated Goal: Patient requesting short-term SNF rehab.  OT Goal Formulation: With patient Time For Goal Achievement: 02/27/20 Potential to Achieve Goals: Good ADL Goals Pt Will Perform Grooming: with modified independence;standing Pt Will Perform Lower Body Bathing: with modified independence;sit to/from stand;sitting/lateral leans;with adaptive equipment Pt Will Perform Lower Body Dressing: with modified independence;with adaptive equipment;sitting/lateral leans;sit to/from stand Pt Will Transfer to Toilet: with modified independence;ambulating;bedside commode Pt Will Perform Toileting - Clothing Manipulation and hygiene: with modified  independence;sitting/lateral leans;sit to/from stand Additional ADL Goal #1: Patient will recall and demonstrate use of 3 energy conservation techniques in prep for BADLs.  OT Frequency: Min 2X/week   Barriers to D/C:            Co-evaluation              AM-PAC OT "6 Clicks" Daily Activity     Outcome Measure Help from another person eating meals?: A Little Help from another person taking care of personal grooming?: A Little Help from another person toileting, which includes using toliet, bedpan, or urinal?: A Little Help from another person bathing (including washing, rinsing, drying)?: A Lot Help from another person to put on and taking off regular upper body clothing?: A Little Help from another person to put on and taking off regular lower body clothing?: A Little 6 Click Score: 17   End of Session Equipment Utilized During Treatment: Gait belt;Rolling walker  Activity Tolerance: Patient tolerated treatment well Patient left: in chair;with call bell/phone within reach;with chair alarm set;with family/visitor present  OT Visit Diagnosis: Unsteadiness on feet (R26.81);Muscle weakness (generalized) (M62.81)                Time: 1991-4445 OT Time Calculation (min): 29 min Charges:  OT General Charges $OT Visit: 1 Visit OT Evaluation $OT Eval Moderate Complexity: 1 Mod OT Treatments $Therapeutic Activity: 8-22 mins  Angel Hobdy H. OTR/L Supplemental OT, Department of rehab services [phone]  Thorne Wirz R H. 02/13/2020, 1:23 PM

## 2020-02-14 ENCOUNTER — Inpatient Hospital Stay (HOSPITAL_COMMUNITY): Payer: Medicare PPO

## 2020-02-14 LAB — BASIC METABOLIC PANEL
Anion gap: 12 (ref 5–15)
BUN: 37 mg/dL — ABNORMAL HIGH (ref 8–23)
CO2: 26 mmol/L (ref 22–32)
Calcium: 8.9 mg/dL (ref 8.9–10.3)
Chloride: 101 mmol/L (ref 98–111)
Creatinine, Ser: 1.98 mg/dL — ABNORMAL HIGH (ref 0.44–1.00)
GFR calc Af Amer: 27 mL/min — ABNORMAL LOW (ref >60)
GFR calc non Af Amer: 23 mL/min — ABNORMAL LOW (ref >60)
Glucose, Bld: 210 mg/dL — ABNORMAL HIGH (ref 70–99)
Potassium: 4.1 mmol/L (ref 3.5–5.1)
Sodium: 139 mmol/L (ref 135–145)

## 2020-02-14 MED ORDER — FUROSEMIDE 10 MG/ML IJ SOLN: 40.0000 mg | Freq: Two times a day (BID) | INTRAMUSCULAR | Status: DC

## 2020-02-14 MED ORDER — FUROSEMIDE 10 MG/ML IJ SOLN
40.0000 mg | Freq: Two times a day (BID) | INTRAMUSCULAR | Status: DC
  Administered 2020-02-14 (×2): 40 mg via INTRAVENOUS
  Filled 2020-02-14 (×2): qty 4

## 2020-02-14 MED ORDER — BUDESONIDE 0.5 MG/2ML IN SUSP
0.5000 mg | Freq: Two times a day (BID) | RESPIRATORY_TRACT | Status: DC
  Administered 2020-02-14 – 2020-02-22 (×16): 0.5 mg via RESPIRATORY_TRACT
  Filled 2020-02-14 (×17): qty 2

## 2020-02-14 NOTE — Progress Notes (Signed)
PROGRESS NOTE    [name]  WUJ:[id number] DOB: 27-Feb-1941 DOA: 02/11/2020 PCP: Girtha Rm, NP-C   Brief Narrative:  Patient is 79 year old female with history of chronic combined systolic/diastolic CHF, CKD stage IV, hypertension, OSA on CPAP, diabetes type 2 who was brought to the emergency room with complaints of shortness of breath, bilateral lower extremity swelling, weight gain.  Patient was found to have acute on chronic CHF exacerbation .  Cardiology consulted and following.  On IV diuresis .  Assessment & Plan:   Principal Problem:   Acute respiratory failure with hypoxia (HCC) Active Problems:   Essential hypertension   COPD (chronic obstructive pulmonary disease) (HCC)   OSA on CPAP   CKD (chronic kidney disease) stage 4, GFR 15-29 ml/min (HCC)   Coronary artery disease involving native coronary artery of native heart without angina pectoris   Acute on chronic combined systolic and diastolic CHF (congestive heart failure) (HCC)   CHF exacerbation (HCC)   Acute on chronic hypoxic respiratory failure: Multifactorial.  Underlying CHF, severe pulmonary hypertension and also restrictive lung disease .  This morning she was on high flow nasal cannula at 9L/min.  Continue to monitor respiratory status.  She is on oxygen at night at home at 2L/min.  Acute on chronic systolic congestive heart failure: Echocardiogram done here showed ejection fraction of 45 to 50%, global hypokinesis, severely reduced right ventricular systolic function, elevated pulmonary artery systolic pressure.  Findings consistent with cor pulmonale.  Cardiology following.  Continue IV diuresis.  Continue to monitor weight, input/output. Patient also has ICD for complete heart block  Pulmonary artery hypertension: Secondary to chronic restrictive lung disease, obstructive sleep apnea.  Chronic respiratory insufficiency/respiratory lung disease: PFTs had shown significant restrictive lung disease with  marked reduced DLCO.  Also has history of OSA on CPAP.  She says she is compliant with that.  Follows with pulmonology Dr. Halford Chessman. VQ scan done here did not show any pulmonary embolism.  Pulmonology consulted.  Steroids discontinued.  Paroxysmal atrial tachycardia: Cardiology following.  Continue current management  Hypertension: Monitor blood pressure.  Continue current regimen.  Coronary artery disease: Previous cardiac cath showed minor nonobstructive disease.  On statin, aspirin, beta-blocker.  CKD stage IIIb: Baseline creatinine ranges from 1.6-1.9.  Currently kidney function at baseline.  Diabetes type 2: Recent hemoglobin A1c of 6.  Currently diet-controlled only.  Debility/deconditioning: Patient seen by physical therapy and occupational therapy and recommended skilled nursing facility on discharge.             DVT prophylaxis: Lovenox Code Status: Full Family Communication: daughter at bedside Status is: Inpatient  Remains inpatient appropriate because:IV treatments appropriate due to intensity of illness or inability to take PO   Dispo: The patient is from: Home              Anticipated d/c is to: SNF              Anticipated d/c date is: 2 days              Patient currently is not medically stable to d/c.    Consultants: Cardiology  Procedures:None  Antimicrobials:  Anti-infectives (From admission, onward)   None      Subjective: Patient seen and examined at the bedside this morning.  Currently dynamically stable.  She was on 9 L of oxygen per minute .  Daughter was at bedside. Objective: Vitals:   02/13/20 2026 02/14/20 0059 02/14/20 0627 02/14/20 0629  BP: [name])  119/62 (!) 131/67 (!) 101/53 117/85  Pulse: 61  64 61  Resp: 19  19   Temp: 98.7 F (37.1 C)  98.6 F (37 C)   TempSrc: Oral  Oral   SpO2: 93%  95% 93%  Weight:  80.6 kg    Height:        Intake/Output Summary (Last 24 hours) at 02/14/2020 0744 Last data filed at 02/14/2020  0600 Gross per 24 hour  Intake 1182 ml  Output 1750 ml  Net -568 ml   Filed Weights   02/12/20 1514 02/13/20 0350 02/14/20 0059  Weight: 79.7 kg 80.4 kg 80.6 kg    Examination:  General exam: Not in distress, obese Respiratory system: Bilateral crackles  cardiovascular system: S1 & S2 heard, RRR. No JVD, murmurs, rubs, gallops or clicks. No pedal edema. Gastrointestinal system: Abdomen is nondistended, soft and nontender. No organomegaly or masses felt. Normal bowel sounds heard. Central nervous system: Alert and oriented. No focal neurological deficits. Extremities: No edema, no clubbing ,no cyanosis, Skin: No rashes, lesions or ulcers,no icterus ,no pallor   Data Reviewed: I have personally reviewed following labs and imaging studies  CBC: Recent Labs  Lab 02/11/20 2014  WBC 4.0  HGB 11.1*  HCT 37.1  MCV 90.0  PLT 675   Basic Metabolic Panel: Recent Labs  Lab 02/11/20 2014 02/13/20 0439  NA 141 141  K 3.6 3.5  CL 103 104  CO2 30 27  GLUCOSE 132* 203*  BUN 22 26*  CREATININE 1.80* 1.80*  CALCIUM 9.1 9.1  MG 2.0  --    GFR: Estimated Creatinine Clearance: 24.1 mL/min (A) (by C-G formula based on SCr of 1.8 mg/dL (H)). Liver Function Tests: No results for input(s): AST, ALT, ALKPHOS, BILITOT, PROT, ALBUMIN in the last 168 hours. No results for input(s): LIPASE, AMYLASE in the last 168 hours. No results for input(s): AMMONIA in the last 168 hours. Coagulation Profile: No results for input(s): INR, PROTIME in the last 168 hours. Cardiac Enzymes: No results for input(s): CKTOTAL, CKMB, CKMBINDEX, TROPONINI in the last 168 hours. BNP (last 3 results) No results for input(s): PROBNP in the last 8760 hours. HbA1C: No results for input(s): HGBA1C in the last 72 hours. CBG: No results for input(s): GLUCAP in the last 168 hours. Lipid Profile: No results for input(s): CHOL, HDL, LDLCALC, TRIG, CHOLHDL, LDLDIRECT in the last 72 hours. Thyroid Function  Tests: No results for input(s): TSH, T4TOTAL, FREET4, T3FREE, THYROIDAB in the last 72 hours. Anemia Panel: No results for input(s): VITAMINB12, FOLATE, FERRITIN, TIBC, IRON, RETICCTPCT in the last 72 hours. Sepsis Labs: No results for input(s): PROCALCITON, LATICACIDVEN in the last 168 hours.  Recent Results (from the past 240 hour(s))  SARS Coronavirus 2 by RT PCR (hospital order, performed in [facility] hospital lab) Nasopharyngeal Nasopharyngeal Swab     Status: None   Collection Time: 02/12/20  1:55 AM   Specimen: Nasopharyngeal Swab  Result Value Ref Range Status   SARS Coronavirus 2 NEGATIVE NEGATIVE Final    Comment: (NOTE) SARS-CoV-2 target nucleic acids are NOT DETECTED.  The SARS-CoV-2 RNA is generally detectable in upper and lower respiratory specimens during the acute phase of infection. The lowest concentration of SARS-CoV-2 viral copies this assay can detect is 250 copies / mL. A negative result does not preclude SARS-CoV-2 infection and should not be used as the sole basis for treatment or other patient management decisions.  A negative result may occur with improper specimen collection /  handling, submission of specimen other than nasopharyngeal swab, presence of viral mutation(s) within the areas targeted by this assay, and inadequate number of viral copies (<250 copies / mL). A negative result must be combined with clinical observations, patient history, and epidemiological information.  Fact Sheet for Patients:   StrictlyIdeas.no  Fact Sheet for Healthcare Providers: BankingDealers.co.za  This test is not yet approved or  cleared by the [place] FDA and has been authorized for detection and/or diagnosis of SARS-CoV-2 by FDA under an Emergency Use Authorization (EUA).  This EUA will remain in effect (meaning this test can be used) for the duration of the COVID-19 declaration under Section 564(b)(1) of the  Act, 21 U.S.C. section 360bbb-3(b)(1), unless the authorization is terminated or revoked sooner.  Performed at [facility], [place] [address], [place], [place] [address]          Radiology Studies: NM Pulmonary Perf and Vent  Result Date: 02/12/2020 CLINICAL DATA:  79 year old female with respiratory failure. EXAM: NUCLEAR MEDICINE PERFUSION LUNG SCAN TECHNIQUE: Perfusion images were obtained in multiple projections after intravenous injection of Tc-37mMAA. RADIOPHARMACEUTICALS:  3.6 mCi Tc973mAA IV COMPARISON:  Chest radiograph dated 02/11/2020. FINDINGS: Perfusion: No wedge shaped peripheral perfusion defects to suggest acute pulmonary embolism. IMPRESSION: Negative for pulmonary embolism. Electronically Signed   By: ArAnner Crete.D.   On: 02/12/2020 16:37   ECHOCARDIOGRAM COMPLETE  Result Date: 02/12/2020    ECHOCARDIOGRAM REPORT   Patient Name:   AMLEXIS POTENZAate of Exam: 02/12/2020 Medical Rec #:  [id number]     Height:       62.0 in Accession #:    [id number]    Weight:       167.6 lb Date of Birth:  1/Apr 06, 1941      BSA:          1.773 m Patient Age:    [age]        BP:           118/62 mmHg Patient Gender: F               HR:           64 bpm. Exam Location:  Inpatient Procedure: 2D Echo Indications:    CHF 428.31  History:        Patient has prior history of Echocardiogram examinations, most                 recent 05/13/2015. CHF and Cardiomyopathy, CAD, COPD and Stroke;                 Risk Factors:Hypertension and Diabetes. Defibrillator. pulmonary                 hypertension.  Sonographer:    ViJannett CelestineDCS (AE) Referring Phys: [email or web address]. Left ventricular ejection fraction, by estimation, is 45 to 50%. The left ventricle has mildly decreased function. The left ventricle demonstrates global hypokinesis. Left ventricular diastolic function could not be evaluated. There is the interventricular septum is flattened in systole and diastole,  consistent with right ventricular pressure and volume overload.  2. Right ventricular systolic function is severely reduced. The right ventricular size is severely enlarged. There is moderately elevated pulmonary artery systolic pressure. The estimated right ventricular systolic pressure is 5278.9mHg.  3. Right atrial size was severely dilated.  4. The mitral valve is grossly normal. Mild mitral valve regurgitation.  No evidence of mitral stenosis.  5. The tricuspid valve is abnormal. 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  6. The aortic valve is tricuspid. Aortic valve regurgitation is not visualized. No aortic stenosis is present.  7. The inferior vena cava is normal in size with greater than 50% respiratory variability, suggesting right atrial pressure of 3 mmHg. Comparison(s): No prior Echocardiogram. Unable to view prior echocardiogram. Conclusion(s)/Recommendation(s): Findings consistent with Cor Pulmonale. FINDINGS  Left Ventricle: Left ventricular ejection fraction, by estimation, is 45 to 50%. The left ventricle has mildly decreased function. The left ventricle demonstrates global hypokinesis. The left ventricular internal cavity size was normal in size. There is  no left ventricular hypertrophy. The interventricular septum is flattened in systole and diastole, consistent with right ventricular pressure and volume overload. Left ventricular diastolic function could not be evaluated due to abnormal septal motion. Left ventricular diastolic function could not be evaluated. Right Ventricle: The right ventricular size is severely enlarged. No increase in right ventricular wall thickness. Right ventricular systolic function is severely reduced. There is moderately elevated pulmonary artery systolic pressure. The tricuspid regurgitant velocity is 3.05 m/s, and with an assumed right atrial pressure of 15 mmHg, the estimated right ventricular systolic pressure is 16.1 mmHg. Left Atrium: Left atrial  size was normal in size. Right Atrium: Right atrial size was severely dilated. Pericardium: Trivial pericardial effusion is present. Mitral Valve: The mitral valve is grossly normal. Mild mitral valve regurgitation. No evidence of mitral valve stenosis. Tricuspid Valve: The tricuspid valve is abnormal. 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 Aortic Valve: The aortic valve is tricuspid. Aortic valve regurgitation is not visualized. No aortic stenosis is present. Aortic valve mean gradient measures 10.0 mmHg. Aortic valve peak gradient measures 15.2 mmHg. Aortic valve area, by VTI measures 1.27 cm. Pulmonic Valve: The pulmonic valve was grossly normal. Pulmonic valve regurgitation is mild. No evidence of pulmonic stenosis. Aorta: The aortic root is normal in size and structure. Venous: The inferior vena cava is normal in size with greater than 50% respiratory variability, suggesting right atrial pressure of 3 mmHg. IAS/Shunts: The atrial septum is grossly normal. Additional Comments: A pacer wire is visualized in the right atrium and right ventricle.  LEFT VENTRICLE PLAX 2D LVIDd:         5.10 cm  Diastology LVIDs:         3.60 cm  LV e' lateral:   12.00 cm/s LV PW:         0.90 cm  LV E/e' lateral: 5.4 LV IVS:        0.90 cm  LV e' medial:    5.55 cm/s LVOT diam:     2.20 cm  LV E/e' medial:  11.6 LV SV:         71 LV SV Index:   40 LVOT Area:     3.80 cm  LEFT ATRIUM           Index       RIGHT ATRIUM           Index LA diam:      5.00 cm 2.82 cm/m  RA Area:     29.50 cm LA Vol (A4C): 32.7 ml 18.44 ml/m RA Volume:   102.00 ml 57.52 ml/m  AORTIC VALVE AV Area (Vmax):    1.73 cm AV Area (Vmean):   1.60 cm AV Area (VTI):     1.27 cm AV Vmax:  195.00 cm/s AV Vmean:          154.000 cm/s AV VTI:            0.558 m AV Peak Grad:      15.2 mmHg AV Mean Grad:      10.0 mmHg LVOT Vmax:         88.80 cm/s LVOT Vmean:        64.700 cm/s LVOT VTI:          0.186 m LVOT/AV VTI ratio: 0.33  AORTA Ao  Root diam: 3.10 cm MITRAL VALVE               TRICUSPID VALVE MV Area (PHT): 3.08 cm    TR Peak grad:   37.2 mmHg MV Decel Time: 246 msec    TR Vmax:        305.00 cm/s MV E velocity: 64.30 cm/s MV A velocity: 66.80 cm/s  SHUNTS MV E/A ratio:  0.96        Systemic VTI:  0.19 m                            Systemic Diam: 2.20 cm Eleonore Chiquito MD Electronically signed by Eleonore Chiquito MD Signature Date/Time: 02/12/2020/12:32:04 PM    Final         Scheduled Meds: . amLODipine  10 mg Oral Daily  . aspirin  81 mg Oral QHS  . atorvastatin  40 mg Oral Daily  . carvedilol  25 mg Oral BID WC  . enoxaparin (LOVENOX) injection  30 mg Subcutaneous Q24H  . ferrous sulfate  325 mg Oral Q breakfast  . furosemide  80 mg Intravenous BID  . hydrALAZINE  25 mg Oral Q8H  . ipratropium-albuterol  3 mL Nebulization TID  . methylPREDNISolone (SOLU-MEDROL) injection  40 mg Intravenous Q6H  . mirabegron ER  50 mg Oral Daily  . nitroGLYCERIN  1 inch Topical Q6H  . potassium chloride  20 mEq Oral Daily  . sodium chloride flush  3 mL Intravenous Q12H   Continuous Infusions: . sodium chloride       LOS: 2 days    Time spent: 35 mins,More than 50% of that time was spent in counseling and/or coordination of care.      Shelly Coss, MD [facility] P7/28/2021, 7:44 AM

## 2020-02-14 NOTE — Progress Notes (Signed)
Progress Note  Patient Name: [name] Date of Encounter: 02/14/2020  [facility] Cardiologist: Sanda Klein, MD   Subjective   She reports feeling better and walked in hall way yesterday, but is still on O2 at 9-10 liters due to low O2 sat. Edema is gone. Still has marked JVD (which will persist due to poor Rv function and severe TR). BUN and creat have increased following diuresis.  Inpatient Medications    Scheduled Meds: . amLODipine  10 mg Oral Daily  . aspirin  81 mg Oral QHS  . atorvastatin  40 mg Oral Daily  . carvedilol  25 mg Oral BID WC  . enoxaparin (LOVENOX) injection  30 mg Subcutaneous Q24H  . ferrous sulfate  325 mg Oral Q breakfast  . furosemide  40 mg Intravenous BID  . hydrALAZINE  25 mg Oral Q8H  . ipratropium-albuterol  3 mL Nebulization TID  . mirabegron ER  50 mg Oral Daily  . nitroGLYCERIN  1 inch Topical Q6H  . potassium chloride  20 mEq Oral Daily  . sodium chloride flush  3 mL Intravenous Q12H   Continuous Infusions: . sodium chloride     PRN Meds: sodium chloride, acetaminophen, ondansetron (ZOFRAN) IV, polyethylene glycol, sodium chloride flush   Vital Signs    Vitals:   02/14/20 0627 02/14/20 0629 02/14/20 0823 02/14/20 0952  BP: (!) 101/53 117/85  (!) 91/61  Pulse: 64 61 60 60  Resp: _0 Temp: 98.6 F (37 C)   98.4 F (36.9 C)  TempSrc: Oral   Oral  SpO2: 95% 93% 90% (!) 87%  Weight:      Height:        Intake/Output Summary (Last 24 hours) at 02/14/2020 1022 Last data filed at 02/14/2020 0600 Gross per 24 hour  Intake 942 ml  Output 1400 ml  Net -458 ml   Last 3 Weights 02/14/2020 02/13/2020 02/12/2020  Weight (lbs) 177 lb 9.6 oz 177 lb 4.8 oz 175 lb 11.3 oz  Weight (kg) 80.559 kg 80.423 kg 79.7 kg      Telemetry    A-paced, v sensed - Personally Reviewed  ECG    No new tracing - Personally Reviewed  Physical Exam  Smiling GEN: No acute distress.   Neck: 8-10 cm JVD Cardiac: RRR, 3/6  holosystolic murmurs, no diastolic murmurs, rubs, or gallops.  Respiratory: Clear to auscultation bilaterally. GI: Soft, nontender, non-distended  MS: No edema; No deformity. Neuro:  Nonfocal  Psych: Normal affect   Labs    High Sensitivity Troponin:   Recent Labs  Lab 02/11/20 2014 02/11/20 2247  TROPONINIHS 17 18*      Chemistry Recent Labs  Lab 02/11/20 2014 02/13/20 0439 02/14/20 0702  NA 141 141 139  K 3.6 3.5 4.1  CL 103 104 101  CO2 _1 GLUCOSE 132* 203* 210*  BUN 22 26* 37*  CREATININE 1.80* 1.80* 1.98*  CALCIUM 9.1 9.1 8.9  GFRNONAA 26* 26* 23*  GFRAA 30* 30* 27*  ANIONGAP _2 Hematology Recent Labs  Lab 02/11/20 2014  WBC 4.0  RBC 4.12  HGB 11.1*  HCT 37.1  MCV 90.0  MCH 26.9  MCHC 29.9*  RDW 15.5  PLT 205    BNP Recent Labs  Lab 02/11/20 2014  BNP 617.2*     DDimer  Recent Labs  Lab 02/12/20 0220  DDIMER 1.07*     Radiology  NM Pulmonary Perf and Vent  Result Date: 02/12/2020 CLINICAL DATA:  79 year old female with respiratory failure. EXAM: NUCLEAR MEDICINE PERFUSION LUNG SCAN TECHNIQUE: Perfusion images were obtained in multiple projections after intravenous injection of Tc-63mMAA. RADIOPHARMACEUTICALS:  3.6 mCi Tc967mAA IV COMPARISON:  Chest radiograph dated 02/11/2020. FINDINGS: Perfusion: No wedge shaped peripheral perfusion defects to suggest acute pulmonary embolism. IMPRESSION: Negative for pulmonary embolism. Electronically Signed   By: ArAnner Crete.D.   On: 02/12/2020 16:37   ECHOCARDIOGRAM COMPLETE  Result Date: 02/12/2020    ECHOCARDIOGRAM REPORT   Patient Name:   AMARLOA PRAKate of Exam: 02/12/2020 Medical Rec #:  [id number]     Height:       62.0 in Accession #:    [id number]    Weight:       167.6 lb Date of Birth:  1/Oct 14, 1940      BSA:          1.773 m Patient Age:    [age]        BP:           118/62 mmHg Patient Gender: F               HR:           64 bpm. Exam Location:   Inpatient Procedure: 2D Echo Indications:    CHF 428.31  History:        Patient has prior history of Echocardiogram examinations, most                 recent 05/13/2015. CHF and Cardiomyopathy, CAD, COPD and Stroke;                 Risk Factors:Hypertension and Diabetes. Defibrillator. pulmonary                 hypertension.  Sonographer:    ViJannett CelestineDCS (AE) Referring Phys: [email or web address]. Left ventricular ejection fraction, by estimation, is 45 to 50%. The left ventricle has mildly decreased function. The left ventricle demonstrates global hypokinesis. Left ventricular diastolic function could not be evaluated. There is the interventricular septum is flattened in systole and diastole, consistent with right ventricular pressure and volume overload.  2. Right ventricular systolic function is severely reduced. The right ventricular size is severely enlarged. There is moderately elevated pulmonary artery systolic pressure. The estimated right ventricular systolic pressure is 5225.9mHg.  3. Right atrial size was severely dilated.  4. The mitral valve is grossly normal. Mild mitral valve regurgitation. No evidence of mitral stenosis.  5. The tricuspid valve is abnormal. 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  6. The aortic valve is tricuspid. Aortic valve regurgitation is not visualized. No aortic stenosis is present.  7. The inferior vena cava is normal in size with greater than 50% respiratory variability, suggesting right atrial pressure of 3 mmHg. Comparison(s): No prior Echocardiogram. Unable to view prior echocardiogram. Conclusion(s)/Recommendation(s): Findings consistent with Cor Pulmonale. FINDINGS  Left Ventricle: Left ventricular ejection fraction, by estimation, is 45 to 50%. The left ventricle has mildly decreased function. The left ventricle demonstrates global hypokinesis. The left ventricular internal cavity size was normal in size. There is  no left ventricular  hypertrophy. The interventricular septum is flattened in systole and diastole, consistent with right ventricular pressure and volume overload. Left ventricular diastolic function could not be evaluated due to abnormal septal motion. Left ventricular diastolic function could  not be evaluated. Right Ventricle: The right ventricular size is severely enlarged. No increase in right ventricular wall thickness. Right ventricular systolic function is severely reduced. There is moderately elevated pulmonary artery systolic pressure. The tricuspid regurgitant velocity is 3.05 m/s, and with an assumed right atrial pressure of 15 mmHg, the estimated right ventricular systolic pressure is 98.9 mmHg. Left Atrium: Left atrial size was normal in size. Right Atrium: Right atrial size was severely dilated. Pericardium: Trivial pericardial effusion is present. Mitral Valve: The mitral valve is grossly normal. Mild mitral valve regurgitation. No evidence of mitral valve stenosis. Tricuspid Valve: The tricuspid valve is abnormal. 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 Aortic Valve: The aortic valve is tricuspid. Aortic valve regurgitation is not visualized. No aortic stenosis is present. Aortic valve mean gradient measures 10.0 mmHg. Aortic valve peak gradient measures 15.2 mmHg. Aortic valve area, by VTI measures 1.27 cm. Pulmonic Valve: The pulmonic valve was grossly normal. Pulmonic valve regurgitation is mild. No evidence of pulmonic stenosis. Aorta: The aortic root is normal in size and structure. Venous: The inferior vena cava is normal in size with greater than 50% respiratory variability, suggesting right atrial pressure of 3 mmHg. IAS/Shunts: The atrial septum is grossly normal. Additional Comments: A pacer wire is visualized in the right atrium and right ventricle.  LEFT VENTRICLE PLAX 2D LVIDd:         5.10 cm  Diastology LVIDs:         3.60 cm  LV e' lateral:   12.00 cm/s LV PW:         0.90 cm  LV E/e'  lateral: 5.4 LV IVS:        0.90 cm  LV e' medial:    5.55 cm/s LVOT diam:     2.20 cm  LV E/e' medial:  11.6 LV SV:         71 LV SV Index:   40 LVOT Area:     3.80 cm  LEFT ATRIUM           Index       RIGHT ATRIUM           Index LA diam:      5.00 cm 2.82 cm/m  RA Area:     29.50 cm LA Vol (A4C): 32.7 ml 18.44 ml/m RA Volume:   102.00 ml 57.52 ml/m  AORTIC VALVE AV Area (Vmax):    1.73 cm AV Area (Vmean):   1.60 cm AV Area (VTI):     1.27 cm AV Vmax:           195.00 cm/s AV Vmean:          154.000 cm/s AV VTI:            0.558 m AV Peak Grad:      15.2 mmHg AV Mean Grad:      10.0 mmHg LVOT Vmax:         88.80 cm/s LVOT Vmean:        64.700 cm/s LVOT VTI:          0.186 m LVOT/AV VTI ratio: 0.33  AORTA Ao Root diam: 3.10 cm MITRAL VALVE               TRICUSPID VALVE MV Area (PHT): 3.08 cm    TR Peak grad:   37.2 mmHg MV Decel Time: 246 msec    TR Vmax:        305.00 cm/s MV E velocity: 64.30  cm/s MV A velocity: 66.80 cm/s  SHUNTS MV E/A ratio:  0.96        Systemic VTI:  0.19 m                            Systemic Diam: 2.20 cm Eleonore Chiquito MD Electronically signed by Eleonore Chiquito MD Signature Date/Time: 02/12/2020/12:32:04 PM    Final     Cardiac Studies   Echo 02/12/2020  1. Left ventricular ejection fraction, by estimation, is 45 to 50%. The  left ventricle has mildly decreased function. The left ventricle  demonstrates global hypokinesis. Left ventricular diastolic function could  not be evaluated. There is the  interventricular septum is flattened in systole and diastole, consistent  with right ventricular pressure and volume overload.  2. Right ventricular systolic function is severely reduced. The right  ventricular size is severely enlarged. There is moderately elevated  pulmonary artery systolic pressure. The estimated right ventricular  systolic pressure is 55.3 mmHg.  3. Right atrial size was severely dilated.  4. The mitral valve is grossly normal. Mild mitral valve  regurgitation.  No evidence of mitral stenosis.  5. The tricuspid valve is abnormal. Tricuspid valve regurgitation is  moderate to severe.  6. The aortic valve is tricuspid. Aortic valve regurgitation is not  visualized. No aortic stenosis is present.  7. The inferior vena cava is normal in size with greater than 50%  respiratory variability, suggesting right atrial pressure of 3 mmHg.    Patient Profile     79 y.o. 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  Assessment & Plan    1.CHF: Positive, but slow progress with diuresis. Weight still substantially higher than her previous estimation of her "dry weight of 160-163 pounds, but renal parameters are starting to deteriorate with diuresis.I decreased the intravenous furosemide. Unchanged LV systolic function. The echo continues to show prominent changes of chronic cor pulmonale. The cardiac output is likely limited by RV dysfunction 2.ZSM:OLMBEMLJ secondary to chronic restrictive lung disease and obstructive sleep apnea,since it could not be entirely explained by elevation of left heart pressures for cardiac catheterization 2016. Not sure pulmonary vasodilators would help - may worsen VQ mismatch. 3.Acute on chronic cor pulmonale:Mildly elevated D-dimer, but the perfusion scan was negative for pulmonary embolism. Gradual onset of symptoms and weight gain appears more typical for (right?) heart failure exacerbation. Relatively clear chest x-ray. 4.Acute on chronic respiratory insufficiency on home O2 at night:Previous PFTs reportedly showed no obstruction but fairly significant  restriction and markedly reduced DLCO.  She reports compliance with CPAP, but her machine has been broken for the last roughly 1 month. This may have contributed to the heart failure exacerbation. Her pulmonologist is Dr. Halford Chessman.  Note that she is not polycythemic. Will ask for advice from the Pulmonary service. Incentive spirometry started. Repeat an Xray (2 view in Radiology). 5.Paroxysmal atrial tachycardia:None detected by her defibrillator since last office visit in May. She has had one episode of atrial tachycardia/slow atrial flutter at about 200 bpm with 3: 1 AV conduction lasting for about an hour, in May 2021. True atrial fibrillation has not been documented. 6. HTN:On carvedilol 25 mg twice daily and  maximum dose amlodipine. Avoiding RAAS inhibitors due to her kidney problems. Hydralazine added. Receiving Nitropaste. BiDil is a good long-term choice. Note rather low diastolic blood pressure. 7. FEO:FHQRF nonobstructive disease by previous heart catheterization. She does not have angina pectoris and her troponin levels are essentially normal. On statin and aspirin and beta-blocker. Note exceptional HDL cholesterol over 100 and LDL in the 30s. 8. CKD 3b: Creatinine increased to 1.98. Seems to be higher than her baseline, previously documented at 1.6-1.9. 9. DM: Diet controlledcurrently, recent hemoglobin A1c 6.0% without medications. Previously on sulfonylurea. 10. XJO:ITGPQD device function ([employer] Sprint Fidelis 6949-lead under advisory).         For questions or updates, please contact [place] Please consult [email or web address] for contact info under        Signed, Sanda Klein, MD  02/14/2020, 10:22 AM

## 2020-02-14 NOTE — Consult Note (Signed)
NAME:  [name], MRN:  [id number], DOB:  [birth year], LOS: 2 ADMISSION DATE:  02/11/2020, CONSULTATION DATE:  7/28 REFERRING MD:  Dr. [name], CHIEF COMPLAINT:  Hypoxia.    Brief History   89F with extensive cardiac and pulmonary history admitted with hypoxemia presumably secondary to CHF exacerbation, however, despite diuresis she remains hypoxemic. PCCM consulted.   History of present illness   79 year old female with PMH as below, which is significant for NICM s/p defibrillator, HFrEF (LVEF 45%), OSA on CPAP, CKD IV, COPD, Pulmonary hypertension, diet controlled DM, and HTN. She is followed by Dr. Halford Chessman in the pulmonary clinic. Her CPAP malfunctioned about 2 months PTA and she has been unable to use it. Has been wearing 2L O2 at night instead. She presented to [facility] ED on 7/25 with complaints of dyspnea and lower extremity edema. O2 sats 88% on room air which did not improve with increased lasix dosing at home x 2 days. Dyspnea was progressive prompting ED presentation.  In the ED she was felt to be volume overloaded and was treated with diuresis. Admitted to the hospitalists service with cardiology consulting. She was treated with supplemental O2 up to 15L, IV diuretics, bronchodilators, and steroids. CXR initially clear and she was worked up for PE with VQ scan, which was negative. On 7/28 despite diuresis she remained hypoxemic requiring 10 L O2 and her renal indices began to increase. PCCM consulted for ongoing hypoxemia.   Her breathing has significantly improved by her estimation since the time of her admission. Her lower extremity edema is much improved. She does still complain of dyspnea on exertion, but no longer of orthopnea. She has no cough and has not experienced subjective fevers.   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  Significant Hospital Events     Consults:  Cardiology Pulmonary  Procedures:    Significant Diagnostic Tests:  VQ 7/26 > negative for PE  Micro Data:    Antimicrobials:     Interim history/subjective:  As above  Objective   Blood pressure (!) 91/61, pulse 60, temperature 98.4 F (36.9 C), temperature source Oral, resp. rate 18, height _0  (1.575 m), weight 80.6 kg, SpO2 (!) 87 %.        Intake/Output Summary (Last 24 hours) at 02/14/2020 1229 Last data filed at 02/14/2020 0600 Gross per 24 hour  Intake 942 ml  Output 1400 ml  Net -458 ml   Filed Weights   02/12/20 1514 02/13/20 0350 02/14/20 0059  Weight: 79.7 kg 80.4 kg 80.6 kg    Examination: General: elderly appearing female resting comfortably in bed.  HENT: [place]/AT, PERRL, significant JVD Lungs: Coarse crackles scattered Cardiovascular: RRR, no MRG Abdomen: Soft, non-tender, non-distended Extremities: No acute deformity, bilateral lower extremity edema.  Neuro: Alert, oriented, non-focal  Resolved Hospital Problem list     Assessment & Plan:   Acute on chronic hypoxemic respiratory failure: remains on 10L [place] despite diuresis 1.5L negative. CXR on admission without clear etiology. VQ negative. Appears volume up on  exam, but limit with aggressive diuresis has perhaps been reached. Pulmonary hypertension likely playing a role considering she has been unable to use CPAP for the past two months.  - Continue supplemental oxygen for SpO2 goal 88-95% - Diuresis as able - CPAP mandatory while sleeping.  -  DC steroids, no history of obstructive disease - Patient feels nebs are helping so OK to continue  Acute on chronic HFrEF: LVEF 45-50%, global hk,  Cor Pulmonale - Cardiology following - Diuresis as able - I/O/Daily weights   COPD +/- acute exacerbation: - Continue nebs - Low threshold to wean/DC systemic steroids.   OSA on CPAP: outpatient machine malfunction - CPAP QHS - outpatient sleep study pending.   Best practice:  Diet: NPO Pain/Anxiety/Delirium protocol (if indicated): NA VAP protocol (if indicated): NA DVT prophylaxis: enoxaparin  GI prophylaxis: NA Glucose control: Per primary Mobility: Up with assist Code Status: FULL Family Communication: patient and daughter updated.  Disposition: Tele  Labs   CBC: Recent Labs  Lab 02/11/20 2014  WBC 4.0  HGB 11.1*  HCT 37.1  MCV 90.0  PLT 071    Basic Metabolic Panel: Recent Labs  Lab 02/11/20 2014 02/13/20 0439 02/14/20 0702  NA 141 141 139  K 3.6 3.5 4.1  CL 103 104 101  CO2 _0 GLUCOSE 132* 203* 210*  BUN 22 26* 37*  CREATININE 1.80* 1.80* 1.98*  CALCIUM 9.1 9.1 8.9  MG 2.0  --   --    GFR: Estimated Creatinine Clearance: 21.9 mL/min (A) (by C-G formula based on SCr of 1.98 mg/dL (H)). Recent Labs  Lab 02/11/20 2014  WBC 4.0    Liver Function Tests: No results for input(s): AST, ALT, ALKPHOS, BILITOT, PROT, ALBUMIN in the last 168 hours. No results for input(s): LIPASE, AMYLASE in the last 168 hours. No results for input(s): AMMONIA in the last 168 hours.  ABG    Component Value Date/Time   PHART 7.421 (H) 06/29/2007 0330   PCO2ART 51.6 (H) 06/29/2007 0330   PO2ART 75.4 (L) 06/29/2007 0330   HCO3 30.0 (H) 06/30/2007 0752   TCO2 31 06/30/2007 0752   O2SAT 61.0 06/30/2007 0752     Coagulation Profile: No results for input(s): INR, PROTIME in the last 168 hours.  Cardiac Enzymes: No results for input(s): CKTOTAL, CKMB, CKMBINDEX, TROPONINI in the last 168  hours.  HbA1C: Hemoglobin A1C  Date/Time Value Ref Range Status  05/07/2017 12:00 AM 6.8  Final   Hgb A1c MFr Bld  Date/Time Value Ref Range Status  10/04/2019 02:44 PM 6.0 (H) 4.8 - 5.6 % Final    Comment:             Prediabetes: 5.7 - 6.4          Diabetes: >6.4          Glycemic control for adults with diabetes: <7.0   04/04/2019 10:49 AM 5.8 (H) 4.8 - 5.6 % Final    Comment:             Prediabetes: 5.7 - 6.4          Diabetes: >6.4          Glycemic control for adults with diabetes: <7.0     CBG: No results for input(s): GLUCAP in the last 168 hours.  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   Surgical History    Past Surgical History:  Procedure Laterality Date  . BIOPSY  03/29/2018   Procedure: BIOPSY;  Surgeon: Yetta Flock, MD;  Location: [facility];  Service: Gastroenterology;;  . COLONOSCOPY    . COLONOSCOPY WITH PROPOFOL N/A 03/29/2018   Procedure: COLONOSCOPY WITH PROPOFOL;  Surgeon: Yetta Flock, MD;  Location: [facility];  Service: Gastroenterology;  Laterality: N/A;  . ESOPHAGOGASTRODUODENOSCOPY (EGD) WITH PROPOFOL N/A 03/29/2018   Procedure: ESOPHAGOGASTRODUODENOSCOPY (EGD) WITH PROPOFOL;  Surgeon: Yetta Flock, MD;  Location: [facility];  Service: Gastroenterology;  Laterality: N/A;  . POLYPECTOMY  03/29/2018   Procedure: POLYPECTOMY;   Surgeon: Yetta Flock, MD;  Location: [facility];  Service: Gastroenterology;;  . TONSILLECTOMY AND ADENOIDECTOMY     age 15  . TUBAL LIGATION     years ago  . UPPER GASTROINTESTINAL ENDOSCOPY    . UPPER GI ENDOSCOPY  11/28/2009   mild erosive esophagitis, mild erosive changes in stomach, chronic constipation     Social History   reports that she quit smoking about 10 years ago. She has never used smokeless tobacco. She reports that she does not drink alcohol and does not use drugs.   Family History   Her family history includes Colon polyps in her daughter. There is no history of Colon cancer, Esophageal cancer, Rectal cancer, or Stomach cancer.   Allergies Allergies  Allergen Reactions  . Benazepril Hcl   . Lotrel [Amlodipine Besy-Benazepril Hcl]   . Talwin [Pentazocine]     Hallulate     Home Medications  Prior to Admission medications   Medication Sig Start Date End Date Taking? Authorizing Provider  amLODipine (NORVASC) 10 MG tablet TAKE 1 TABLET EVERY DAY Patient taking differently: Take 10 mg by mouth daily.  01/16/20  Yes Henson, Vickie L, NP-C  aspirin 81 MG tablet Take 81 mg by mouth at bedtime.    Yes [provider]  atorvastatin (LIPITOR) 40 MG tablet TAKE 1 TABLET EVERY DAY Patient taking differently: Take 40 mg by mouth daily.  01/16/20  Yes Henson, Vickie L, NP-C  carvedilol (COREG) 25 MG tablet TAKE 1 TABLET TWICE DAILY WITH A MEAL Patient taking differently: Take 25 mg by mouth 2 (two) times daily with a meal.  01/24/19  Yes Henson, Vickie L, NP-C  ferrous sulfate 325 (65 FE) MG tablet Take 325 mg by mouth daily with breakfast.    Yes [provider]  furosemide (LASIX) 40 MG tablet TAKE 1 TABLET TWICE DAILY Patient taking differently: Take 40 mg by mouth daily.  01/24/19  Yes Henson, Vickie L, NP-C  MYRBETRIQ 50 MG TB24 tablet TAKE 1 TABLET EVERY DAY Patient taking differently: Take 50 mg by mouth daily.  10/25/19  Yes Henson, Vickie L,  NP-C  VITAMIN D, CHOLECALCIFEROL, PO Take 1 tablet by mouth daily.    Yes [provider]  ACCU-CHEK AVIVA PLUS test strip USE  TO TEST BLOOD SUGAR ONE TIME DAILY 02/08/20   Harland Dingwall L, NP-C  Accu-Chek Softclix Lancets lancets USE  AS  INSTRUCTED 06/26/19   Harland Dingwall L, NP-C  Alcohol  Swabs (B-D SINGLE USE SWABS REGULAR) PADS USE  TO TEST BLOOD SUGAR ONE TIME DAILY 07/27/19   Henson, Vickie L, NP-C  metolazone (ZAROXOLYN) 2.5 MG tablet Take 1 tablet (2.5 mg total) by mouth daily as needed. 02/11/20 05/11/20  Croitoru, Mihai, MD  OXYGEN Inhale 2 L into the lungs at bedtime.     [provider]  potassium chloride (KLOR-CON) 10 MEQ tablet Take 1 tablet (10 mEq total) by mouth daily as needed. 02/11/20 05/11/20  Croitoru, Dani Gobble, MD      Georgann Housekeeper, AGACNP-BC [place] for personal pager PCCM on call pager [phone]  02/14/2020 2:04 PM

## 2020-02-14 NOTE — NC FL2 (Signed)
[place] LEVEL OF CARE SCREENING TOOL     IDENTIFICATION  Patient Name: [name] Birthdate: [birth year] Sex: female Admission Date (Current Location): 02/11/2020  [facility] and [place] Number:  Herbalist and Address:  [facility], [place] [address], [place], [place] [address]      Provider Number: [id number]  Attending Physician Name and Address:  Shelly Coss, MD  Relative Name and Phone Number:       Current Level of Care: Hospital Recommended Level of Care: [place] Prior Approval Number:    Date Approved/Denied:   PASRR Number: [id number]  Discharge Plan: SNF    Current Diagnoses: Patient Active Problem List   Diagnosis Date Noted  . Acute on chronic combined systolic and diastolic CHF (congestive heart failure) ([place]) 02/12/2020  . Acute respiratory failure with hypoxia ([place]) 02/12/2020  . CHF exacerbation ([place]) 02/12/2020  . D dimer value normal   . Diabetes mellitus type 2, diet-controlled ([place])   . Abnormal result of cardiovascular function study, unspecified 09/16/2018  . Callus 09/16/2018  . Claudication of both lower extremities ([place]) 09/16/2018  . Dependence on other enabling machines and devices 09/16/2018  . Dizziness 09/16/2018  . Dyspnea, unspecified 09/16/2018  . Medicare annual wellness visit, subsequent 09/16/2018  . Onychomycosis due to dermatophyte 09/16/2018  . Overgrown toenails 09/16/2018  . Other secondary pulmonary hypertension ([place]) 09/16/2018  . Iron deficiency anemia   . On home O2   . Benign neoplasm of colon   . PAH (pulmonary artery hypertension) ([place]) 09/09/2017  . PAT (paroxysmal atrial tachycardia) ([place]) 09/09/2017  . Hypercholesterolemia 09/09/2017  . OSA on CPAP 09/08/2017  . Malignant neoplasm of rectum ([place]) 09/08/2017  . Type 2 diabetes, controlled, with peripheral neuropathy ([place])   . Other cardiomyopathies ([place])   . Chronic combined systolic  (congestive) and diastolic (congestive) heart failure ([place])   . CKD (chronic kidney disease) stage 4, GFR 15-29 ml/min (HCC)   . ICD (implantable cardioverter-defibrillator) in place   . Urge incontinence of urine   . Coronary artery disease involving native coronary artery of native heart without angina pectoris   . Chronic kidney disease 09/06/2017  . Restrictive lung disease 05/04/2017  . Body mass index (bmi) 33.0-33.9, adult 06/13/2014  . DEPRESSION 07/26/2007  . Essential hypertension 07/26/2007  . CARDIOMYOPATHY 07/26/2007  . COPD (chronic obstructive pulmonary disease) ([place]) 07/26/2007    Orientation RESPIRATION BLADDER Height & Weight     Self, Place, Situation, Time (within defined limits)  O2 (nasal canula) Incontinent Weight: 177 lb 9.6 oz (80.6 kg) Height:  _0  (157.5 cm)  BEHAVIORAL SYMPTOMS/MOOD NEUROLOGICAL BOWEL NUTRITION STATUS      Continent    AMBULATORY STATUS COMMUNICATION OF NEEDS Skin   Extensive Assist Verbally Normal (dry skin)                       Personal Care Assistance Level of Assistance  Bathing, Feeding, Dressing Bathing Assistance: Maximum assistance Feeding assistance: Limited assistance Dressing Assistance: Maximum assistance     Functional Limitations Info  Sight, Hearing, Speech Sight Info: Adequate Hearing Info: Adequate Speech Info: Adequate    SPECIAL CARE FACTORS FREQUENCY  PT (By licensed PT), OT (By licensed OT)     PT Frequency: 5x week OT Frequency: 5x week            Contractures Contractures Info: Not present    Additional Factors Info  Code Status,  Allergies Code Status Info: Full Code Allergies Info: Benazepril Hcl, Lotrel (Amlodipine Besy-benazepril Hcl), Talwin (Pentazocine)           Current Medications (02/14/2020):  This is the current hospital active medication list Current Facility-Administered Medications  Medication Dose Route Frequency Provider Last Rate Last Admin  . 0.9 %  sodium  chloride infusion  250 mL Intravenous PRN Etta Quill, DO      . acetaminophen (TYLENOL) tablet 650 mg  650 mg Oral Q4H PRN Etta Quill, DO   650 mg at 02/13/20 1406  . amLODipine (NORVASC) tablet 10 mg  10 mg Oral Daily Jennette Kettle M, DO   10 mg at 02/13/20 1540  . aspirin chewable tablet 81 mg  81 mg Oral QHS Etta Quill, DO   81 mg at 02/13/20 2042  . atorvastatin (LIPITOR) tablet 40 mg  40 mg Oral Daily Jennette Kettle M, DO   40 mg at 02/14/20 1005  . budesonide (PULMICORT) nebulizer solution 0.5 mg  0.5 mg Nebulization BID Corey Harold, NP      . carvedilol (COREG) tablet 25 mg  25 mg Oral BID WC Jennette Kettle M, DO   25 mg at 02/13/20 1711  . enoxaparin (LOVENOX) injection 30 mg  30 mg Subcutaneous Q24H Jennette Kettle M, DO   30 mg at 02/14/20 1429  . ferrous sulfate tablet 325 mg  325 mg Oral Q breakfast Etta Quill, DO   325 mg at 02/14/20 1005  . furosemide (LASIX) injection 40 mg  40 mg Intravenous BID Hammons, Kimberly B, RPH   40 mg at 02/14/20 1024  . hydrALAZINE (APRESOLINE) tablet 25 mg  25 mg Oral Q8H Jennette Kettle M, DO   25 mg at 02/14/20 1429  . ipratropium-albuterol (DUONEB) 0.5-2.5 (3) MG/3ML nebulizer solution 3 mL  3 mL Nebulization TID Oswald Hillock, MD   3 mL at 02/14/20 0823  . mirabegron ER (MYRBETRIQ) tablet 50 mg  50 mg Oral Daily Jennette Kettle M, DO   50 mg at 02/14/20 1005  . nitroGLYCERIN (NITROGLYN) 2 % ointment 1 inch  1 inch Topical Q6H Dorie Rank, MD   1 inch at 02/14/20 1430  . ondansetron (ZOFRAN) injection 4 mg  4 mg Intravenous Q6H PRN Etta Quill, DO      . polyethylene glycol (MIRALAX / GLYCOLAX) packet 17 g  17 g Oral Daily PRN Oswald Hillock, MD   17 g at 02/13/20 1416  . potassium chloride SA (KLOR-CON) CR tablet 20 mEq  20 mEq Oral Daily Croitoru, Mihai, MD   20 mEq at 02/14/20 1004  . sodium chloride flush (NS) 0.9 % injection 3 mL  3 mL Intravenous Q12H Jennette Kettle M, DO   3 mL at 02/14/20 1011  . sodium chloride  flush (NS) 0.9 % injection 3 mL  3 mL Intravenous PRN Etta Quill, DO         Discharge Medications: Please see discharge summary for a list of discharge medications.  Relevant Imaging Results:  Relevant Lab Results:   Additional Information [address] [place], [place]

## 2020-02-14 NOTE — Progress Notes (Signed)
Patient and family refused CPAP for tonight

## 2020-02-14 NOTE — H&P (View-Only) (Signed)
Progress Note  Patient Name: [name] Date of Encounter: 02/14/2020  [facility] Cardiologist: Sanda Klein, MD   Subjective   She reports feeling better and walked in hall way yesterday, but is still on O2 at 9-10 liters due to low O2 sat. Edema is gone. Still has marked JVD (which will persist due to poor Rv function and severe TR). BUN and creat have increased following diuresis.  Inpatient Medications    Scheduled Meds: . amLODipine  10 mg Oral Daily  . aspirin  81 mg Oral QHS  . atorvastatin  40 mg Oral Daily  . carvedilol  25 mg Oral BID WC  . enoxaparin (LOVENOX) injection  30 mg Subcutaneous Q24H  . ferrous sulfate  325 mg Oral Q breakfast  . furosemide  40 mg Intravenous BID  . hydrALAZINE  25 mg Oral Q8H  . ipratropium-albuterol  3 mL Nebulization TID  . mirabegron ER  50 mg Oral Daily  . nitroGLYCERIN  1 inch Topical Q6H  . potassium chloride  20 mEq Oral Daily  . sodium chloride flush  3 mL Intravenous Q12H   Continuous Infusions: . sodium chloride     PRN Meds: sodium chloride, acetaminophen, ondansetron (ZOFRAN) IV, polyethylene glycol, sodium chloride flush   Vital Signs    Vitals:   02/14/20 0627 02/14/20 0629 02/14/20 0823 02/14/20 0952  BP: (!) 101/53 117/85  (!) 91/61  Pulse: 64 61 60 60  Resp: _0 Temp: 98.6 F (37 C)   98.4 F (36.9 C)  TempSrc: Oral   Oral  SpO2: 95% 93% 90% (!) 87%  Weight:      Height:        Intake/Output Summary (Last 24 hours) at 02/14/2020 1022 Last data filed at 02/14/2020 0600 Gross per 24 hour  Intake 942 ml  Output 1400 ml  Net -458 ml   Last 3 Weights 02/14/2020 02/13/2020 02/12/2020  Weight (lbs) 177 lb 9.6 oz 177 lb 4.8 oz 175 lb 11.3 oz  Weight (kg) 80.559 kg 80.423 kg 79.7 kg      Telemetry    A-paced, v sensed - Personally Reviewed  ECG    No new tracing - Personally Reviewed  Physical Exam  Smiling GEN: No acute distress.   Neck: 8-10 cm JVD Cardiac: RRR, 3/6  holosystolic murmurs, no diastolic murmurs, rubs, or gallops.  Respiratory: Clear to auscultation bilaterally. GI: Soft, nontender, non-distended  MS: No edema; No deformity. Neuro:  Nonfocal  Psych: Normal affect   Labs    High Sensitivity Troponin:   Recent Labs  Lab 02/11/20 2014 02/11/20 2247  TROPONINIHS 17 18*      Chemistry Recent Labs  Lab 02/11/20 2014 02/13/20 0439 02/14/20 0702  NA 141 141 139  K 3.6 3.5 4.1  CL 103 104 101  CO2 _1 GLUCOSE 132* 203* 210*  BUN 22 26* 37*  CREATININE 1.80* 1.80* 1.98*  CALCIUM 9.1 9.1 8.9  GFRNONAA 26* 26* 23*  GFRAA 30* 30* 27*  ANIONGAP _2 Hematology Recent Labs  Lab 02/11/20 2014  WBC 4.0  RBC 4.12  HGB 11.1*  HCT 37.1  MCV 90.0  MCH 26.9  MCHC 29.9*  RDW 15.5  PLT 205    BNP Recent Labs  Lab 02/11/20 2014  BNP 617.2*     DDimer  Recent Labs  Lab 02/12/20 0220  DDIMER 1.07*     Radiology  NM Pulmonary Perf and Vent  Result Date: 02/12/2020 CLINICAL DATA:  79 year old female with respiratory failure. EXAM: NUCLEAR MEDICINE PERFUSION LUNG SCAN TECHNIQUE: Perfusion images were obtained in multiple projections after intravenous injection of Tc-63mMAA. RADIOPHARMACEUTICALS:  3.6 mCi Tc967mAA IV COMPARISON:  Chest radiograph dated 02/11/2020. FINDINGS: Perfusion: No wedge shaped peripheral perfusion defects to suggest acute pulmonary embolism. IMPRESSION: Negative for pulmonary embolism. Electronically Signed   By: ArAnner Crete.D.   On: 02/12/2020 16:37   ECHOCARDIOGRAM COMPLETE  Result Date: 02/12/2020    ECHOCARDIOGRAM REPORT   Patient Name:   [name] of Exam: 02/12/2020 Medical Rec #:  [id number]     Height:       62.0 in Accession #:    [id number]    Weight:       167.6 lb Date of Birth:  1/Oct 14, 1940      BSA:          1.773 m Patient Age:    [age]        BP:           118/62 mmHg Patient Gender: F               HR:           64 bpm. Exam Location:   Inpatient Procedure: 2D Echo Indications:    CHF 428.31  History:        Patient has prior history of Echocardiogram examinations, most                 recent 05/13/2015. CHF and Cardiomyopathy, CAD, COPD and Stroke;                 Risk Factors:Hypertension and Diabetes. Defibrillator. pulmonary                 hypertension.  Sonographer:    ViJannett CelestineDCS (AE) Referring Phys: [email or web address]. Left ventricular ejection fraction, by estimation, is 45 to 50%. The left ventricle has mildly decreased function. The left ventricle demonstrates global hypokinesis. Left ventricular diastolic function could not be evaluated. There is the interventricular septum is flattened in systole and diastole, consistent with right ventricular pressure and volume overload.  2. Right ventricular systolic function is severely reduced. The right ventricular size is severely enlarged. There is moderately elevated pulmonary artery systolic pressure. The estimated right ventricular systolic pressure is 5225.9mHg.  3. Right atrial size was severely dilated.  4. The mitral valve is grossly normal. Mild mitral valve regurgitation. No evidence of mitral stenosis.  5. The tricuspid valve is abnormal. 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  6. The aortic valve is tricuspid. Aortic valve regurgitation is not visualized. No aortic stenosis is present.  7. The inferior vena cava is normal in size with greater than 50% respiratory variability, suggesting right atrial pressure of 3 mmHg. Comparison(s): No prior Echocardiogram. Unable to view prior echocardiogram. Conclusion(s)/Recommendation(s): Findings consistent with Cor Pulmonale. FINDINGS  Left Ventricle: Left ventricular ejection fraction, by estimation, is 45 to 50%. The left ventricle has mildly decreased function. The left ventricle demonstrates global hypokinesis. The left ventricular internal cavity size was normal in size. There is  no left ventricular  hypertrophy. The interventricular septum is flattened in systole and diastole, consistent with right ventricular pressure and volume overload. Left ventricular diastolic function could not be evaluated due to abnormal septal motion. Left ventricular diastolic function could  not be evaluated. Right Ventricle: The right ventricular size is severely enlarged. No increase in right ventricular wall thickness. Right ventricular systolic function is severely reduced. There is moderately elevated pulmonary artery systolic pressure. The tricuspid regurgitant velocity is 3.05 m/s, and with an assumed right atrial pressure of 15 mmHg, the estimated right ventricular systolic pressure is 98.9 mmHg. Left Atrium: Left atrial size was normal in size. Right Atrium: Right atrial size was severely dilated. Pericardium: Trivial pericardial effusion is present. Mitral Valve: The mitral valve is grossly normal. Mild mitral valve regurgitation. No evidence of mitral valve stenosis. Tricuspid Valve: The tricuspid valve is abnormal. 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 Aortic Valve: The aortic valve is tricuspid. Aortic valve regurgitation is not visualized. No aortic stenosis is present. Aortic valve mean gradient measures 10.0 mmHg. Aortic valve peak gradient measures 15.2 mmHg. Aortic valve area, by VTI measures 1.27 cm. Pulmonic Valve: The pulmonic valve was grossly normal. Pulmonic valve regurgitation is mild. No evidence of pulmonic stenosis. Aorta: The aortic root is normal in size and structure. Venous: The inferior vena cava is normal in size with greater than 50% respiratory variability, suggesting right atrial pressure of 3 mmHg. IAS/Shunts: The atrial septum is grossly normal. Additional Comments: A pacer wire is visualized in the right atrium and right ventricle.  LEFT VENTRICLE PLAX 2D LVIDd:         5.10 cm  Diastology LVIDs:         3.60 cm  LV e' lateral:   12.00 cm/s LV PW:         0.90 cm  LV E/e'  lateral: 5.4 LV IVS:        0.90 cm  LV e' medial:    5.55 cm/s LVOT diam:     2.20 cm  LV E/e' medial:  11.6 LV SV:         71 LV SV Index:   40 LVOT Area:     3.80 cm  LEFT ATRIUM           Index       RIGHT ATRIUM           Index LA diam:      5.00 cm 2.82 cm/m  RA Area:     29.50 cm LA Vol (A4C): 32.7 ml 18.44 ml/m RA Volume:   102.00 ml 57.52 ml/m  AORTIC VALVE AV Area (Vmax):    1.73 cm AV Area (Vmean):   1.60 cm AV Area (VTI):     1.27 cm AV Vmax:           195.00 cm/s AV Vmean:          154.000 cm/s AV VTI:            0.558 m AV Peak Grad:      15.2 mmHg AV Mean Grad:      10.0 mmHg LVOT Vmax:         88.80 cm/s LVOT Vmean:        64.700 cm/s LVOT VTI:          0.186 m LVOT/AV VTI ratio: 0.33  AORTA Ao Root diam: 3.10 cm MITRAL VALVE               TRICUSPID VALVE MV Area (PHT): 3.08 cm    TR Peak grad:   37.2 mmHg MV Decel Time: 246 msec    TR Vmax:        305.00 cm/s MV E velocity: 64.30  cm/s MV A velocity: 66.80 cm/s  SHUNTS MV E/A ratio:  0.96        Systemic VTI:  0.19 m                            Systemic Diam: 2.20 cm Eleonore Chiquito MD Electronically signed by Eleonore Chiquito MD Signature Date/Time: 02/12/2020/12:32:04 PM    Final     Cardiac Studies   Echo 02/12/2020  1. Left ventricular ejection fraction, by estimation, is 45 to 50%. The  left ventricle has mildly decreased function. The left ventricle  demonstrates global hypokinesis. Left ventricular diastolic function could  not be evaluated. There is the  interventricular septum is flattened in systole and diastole, consistent  with right ventricular pressure and volume overload.  2. Right ventricular systolic function is severely reduced. The right  ventricular size is severely enlarged. There is moderately elevated  pulmonary artery systolic pressure. The estimated right ventricular  systolic pressure is 55.3 mmHg.  3. Right atrial size was severely dilated.  4. The mitral valve is grossly normal. Mild mitral valve  regurgitation.  No evidence of mitral stenosis.  5. The tricuspid valve is abnormal. Tricuspid valve regurgitation is  moderate to severe.  6. The aortic valve is tricuspid. Aortic valve regurgitation is not  visualized. No aortic stenosis is present.  7. The inferior vena cava is normal in size with greater than 50%  respiratory variability, suggesting right atrial pressure of 3 mmHg.    Patient Profile     79 y.o. 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  Assessment & Plan    1.CHF: Positive, but slow progress with diuresis. Weight still substantially higher than her previous estimation of her "dry weight of 160-163 pounds, but renal parameters are starting to deteriorate with diuresis.I decreased the intravenous furosemide. Unchanged LV systolic function. The echo continues to show prominent changes of chronic cor pulmonale. The cardiac output is likely limited by RV dysfunction 2.ZSM:OLMBEMLJ secondary to chronic restrictive lung disease and obstructive sleep apnea,since it could not be entirely explained by elevation of left heart pressures for cardiac catheterization 2016. Not sure pulmonary vasodilators would help - may worsen VQ mismatch. 3.Acute on chronic cor pulmonale:Mildly elevated D-dimer, but the perfusion scan was negative for pulmonary embolism. Gradual onset of symptoms and weight gain appears more typical for (right?) heart failure exacerbation. Relatively clear chest x-ray. 4.Acute on chronic respiratory insufficiency on home O2 at night:Previous PFTs reportedly showed no obstruction but fairly significant  restriction and markedly reduced DLCO.  She reports compliance with CPAP, but her machine has been broken for the last roughly 1 month. This may have contributed to the heart failure exacerbation. Her pulmonologist is Dr. Halford Chessman.  Note that she is not polycythemic. Will ask for advice from the Pulmonary service. Incentive spirometry started. Repeat an Xray (2 view in Radiology). 5.Paroxysmal atrial tachycardia:None detected by her defibrillator since last office visit in May. She has had one episode of atrial tachycardia/slow atrial flutter at about 200 bpm with 3: 1 AV conduction lasting for about an hour, in May 2021. True atrial fibrillation has not been documented. 6. HTN:On carvedilol 25 mg twice daily and  maximum dose amlodipine. Avoiding RAAS inhibitors due to her kidney problems. Hydralazine added. Receiving Nitropaste. BiDil is a good long-term choice. Note rather low diastolic blood pressure. 7. FEO:FHQRF nonobstructive disease by previous heart catheterization. She does not have angina pectoris and her troponin levels are essentially normal. On statin and aspirin and beta-blocker. Note exceptional HDL cholesterol over 100 and LDL in the 30s. 8. CKD 3b: Creatinine increased to 1.98. Seems to be higher than her baseline, previously documented at 1.6-1.9. 9. DM: Diet controlledcurrently, recent hemoglobin A1c 6.0% without medications. Previously on sulfonylurea. 10. XJO:ITGPQD device function ([employer] Sprint Fidelis 6949-lead under advisory).         For questions or updates, please contact [place] Please consult [email or web address] for contact info under        Signed, Sanda Klein, MD  02/14/2020, 10:22 AM

## 2020-02-15 ENCOUNTER — Encounter (HOSPITAL_COMMUNITY)
Admission: EM | Disposition: A | Discharge status: Skilled Nursing Facility | Payer: Self-pay | Source: Home / Self Care | Attending: Internal Medicine

## 2020-02-15 ENCOUNTER — Inpatient Hospital Stay (HOSPITAL_COMMUNITY): Payer: Medicare PPO

## 2020-02-15 DIAGNOSIS — J9601 Acute respiratory failure with hypoxia: Secondary | ICD-10-CM | POA: Diagnosis not present

## 2020-02-15 DIAGNOSIS — I2721 Secondary pulmonary arterial hypertension: Secondary | ICD-10-CM | POA: Diagnosis not present

## 2020-02-15 DIAGNOSIS — I2510 Atherosclerotic heart disease of native coronary artery without angina pectoris: Secondary | ICD-10-CM | POA: Diagnosis not present

## 2020-02-15 DIAGNOSIS — N184 Chronic kidney disease, stage 4 (severe): Secondary | ICD-10-CM | POA: Diagnosis not present

## 2020-02-15 DIAGNOSIS — I5043 Acute on chronic combined systolic (congestive) and diastolic (congestive) heart failure: Secondary | ICD-10-CM | POA: Diagnosis not present

## 2020-02-15 HISTORY — PX: RIGHT HEART CATH: CATH118263

## 2020-02-15 LAB — POCT I-STAT EG7
Acid-Base Excess: 0 mmol/L (ref 0.0–2.0)
Acid-Base Excess: 3 mmol/L — ABNORMAL HIGH (ref 0.0–2.0)
Acid-Base Excess: 4 mmol/L — ABNORMAL HIGH (ref 0.0–2.0)
Bicarbonate: 25.1 mmol/L (ref 20.0–28.0)
Bicarbonate: 29.2 mmol/L — ABNORMAL HIGH (ref 20.0–28.0)
Bicarbonate: 29.8 mmol/L — ABNORMAL HIGH (ref 20.0–28.0)
Calcium, Ion: 0.76 mmol/L — CL (ref 1.15–1.40)
Calcium, Ion: 1.04 mmol/L — ABNORMAL LOW (ref 1.15–1.40)
Calcium, Ion: 1.12 mmol/L — ABNORMAL LOW (ref 1.15–1.40)
HCT: 30 % — ABNORMAL LOW (ref 36.0–46.0)
HCT: 36 % (ref 36.0–46.0)
HCT: 37 % (ref 36.0–46.0)
Hemoglobin: 10.2 g/dL — ABNORMAL LOW (ref 12.0–15.0)
Hemoglobin: 12.2 g/dL (ref 12.0–15.0)
Hemoglobin: 12.6 g/dL (ref 12.0–15.0)
O2 Saturation: 61 %
O2 Saturation: 64 %
O2 Saturation: 64 %
Potassium: 3.1 mmol/L — ABNORMAL LOW (ref 3.5–5.1)
Potassium: 3.9 mmol/L (ref 3.5–5.1)
Potassium: 4.1 mmol/L (ref 3.5–5.1)
Sodium: 142 mmol/L (ref 135–145)
Sodium: 143 mmol/L (ref 135–145)
Sodium: 151 mmol/L — ABNORMAL HIGH (ref 135–145)
TCO2: 26 mmol/L (ref 22–32)
TCO2: 31 mmol/L (ref 22–32)
TCO2: 31 mmol/L (ref 22–32)
pCO2, Ven: 44.3 mmHg (ref 44.0–60.0)
pCO2, Ven: 50.8 mmHg (ref 44.0–60.0)
pCO2, Ven: 51 mmHg (ref 44.0–60.0)
pH, Ven: 7.362 (ref 7.250–7.430)
pH, Ven: 7.365 (ref 7.250–7.430)
pH, Ven: 7.376 (ref 7.250–7.430)
pO2, Ven: 33 mmHg (ref 32.0–45.0)
pO2, Ven: 34 mmHg (ref 32.0–45.0)
pO2, Ven: 34 mmHg (ref 32.0–45.0)

## 2020-02-15 LAB — CBC
HCT: 36.9 % (ref 36.0–46.0)
Hemoglobin: 11.3 g/dL — ABNORMAL LOW (ref 12.0–15.0)
MCH: 27 pg (ref 26.0–34.0)
MCHC: 30.6 g/dL (ref 30.0–36.0)
MCV: 88.1 fL (ref 80.0–100.0)
Platelets: 190 10*3/uL (ref 150–400)
RBC: 4.19 MIL/uL (ref 3.87–5.11)
RDW: 15.2 % (ref 11.5–15.5)
WBC: 6.5 10*3/uL (ref 4.0–10.5)
nRBC: 0 % (ref 0.0–0.2)

## 2020-02-15 LAB — HEMOGLOBIN A1C
Hgb A1c MFr Bld: 6.5 % — ABNORMAL HIGH (ref 4.8–5.6)
Mean Plasma Glucose: 139.85 mg/dL

## 2020-02-15 LAB — GLUCOSE, CAPILLARY
Glucose-Capillary: 264 mg/dL — ABNORMAL HIGH (ref 70–99)
Glucose-Capillary: 148 mg/dL — ABNORMAL HIGH (ref 70–99)
Glucose-Capillary: 130 mg/dL — ABNORMAL HIGH (ref 70–99)
Glucose-Capillary: 220 mg/dL — ABNORMAL HIGH (ref 70–99)

## 2020-02-15 LAB — BASIC METABOLIC PANEL
Anion gap: 9 (ref 5–15)
BUN: 48 mg/dL — ABNORMAL HIGH (ref 8–23)
CO2: 28 mmol/L (ref 22–32)
Calcium: 8.5 mg/dL — ABNORMAL LOW (ref 8.9–10.3)
Chloride: 99 mmol/L (ref 98–111)
Creatinine, Ser: 2.17 mg/dL — ABNORMAL HIGH (ref 0.44–1.00)
GFR calc Af Amer: 24 mL/min — ABNORMAL LOW (ref >60)
GFR calc non Af Amer: 21 mL/min — ABNORMAL LOW (ref >60)
Glucose, Bld: 310 mg/dL — ABNORMAL HIGH (ref 70–99)
Potassium: 4.2 mmol/L (ref 3.5–5.1)
Sodium: 136 mmol/L (ref 135–145)

## 2020-02-15 LAB — CREATININE, SERUM
Creatinine, Ser: 2.04 mg/dL — ABNORMAL HIGH (ref 0.44–1.00)
GFR calc Af Amer: 26 mL/min — ABNORMAL LOW (ref >60)
GFR calc non Af Amer: 22 mL/min — ABNORMAL LOW (ref >60)

## 2020-02-15 LAB — C-REACTIVE PROTEIN: CRP: 0.7 mg/dL (ref <1.0)

## 2020-02-15 LAB — SEDIMENTATION RATE: Sed Rate: 5 mm/hr (ref 0–22)

## 2020-02-15 SURGERY — RIGHT HEART CATH

## 2020-02-15 MED ORDER — LIDOCAINE HCL (PF) 1 % IJ SOLN
INTRAMUSCULAR | Status: DC | PRN
  Administered 2020-02-15: 3 mL

## 2020-02-15 MED ORDER — SILDENAFIL CITRATE 20 MG PO TABS
20.0000 mg | ORAL_TABLET | Freq: Three times a day (TID) | ORAL | Status: DC
  Administered 2020-02-16 – 2020-02-22 (×16): 20 mg via ORAL
  Filled 2020-02-15 (×18): qty 1

## 2020-02-15 MED ORDER — SODIUM CHLORIDE 0.9% FLUSH: 3.0000 mL | INTRAVENOUS | Status: DC | PRN

## 2020-02-15 MED ORDER — HEPARIN (PORCINE) IN NACL 1000-0.9 UT/500ML-% IV SOLN
INTRAVENOUS | Status: DC | PRN
  Administered 2020-02-15: 500 mL

## 2020-02-15 MED ORDER — SODIUM CHLORIDE 0.9 % IV SOLN: INTRAVENOUS | Status: DC

## 2020-02-15 MED ORDER — INSULIN GLARGINE 100 UNIT/ML ~~LOC~~ SOLN
10.0000 [IU] | Freq: Every day | SUBCUTANEOUS | Status: DC
  Filled 2020-02-15: qty 0.1

## 2020-02-15 MED ORDER — SODIUM CHLORIDE 0.9% FLUSH
3.0000 mL | Freq: Two times a day (BID) | INTRAVENOUS | Status: DC
  Administered 2020-02-16 – 2020-02-22 (×12): 3 mL via INTRAVENOUS

## 2020-02-15 MED ORDER — ENOXAPARIN SODIUM 30 MG/0.3ML ~~LOC~~ SOLN
30.0000 mg | SUBCUTANEOUS | Status: DC
  Administered 2020-02-16 – 2020-02-22 (×7): 30 mg via SUBCUTANEOUS
  Filled 2020-02-15 (×7): qty 0.3

## 2020-02-15 MED ORDER — HYDRALAZINE HCL 20 MG/ML IJ SOLN: 10.0000 mg | INTRAMUSCULAR | Status: AC | PRN

## 2020-02-15 MED ORDER — LABETALOL HCL 5 MG/ML IV SOLN: 10.0000 mg | INTRAVENOUS | Status: AC | PRN

## 2020-02-15 MED ORDER — LIDOCAINE HCL (PF) 1 % IJ SOLN
INTRAMUSCULAR | Status: AC
  Filled 2020-02-15: qty 30

## 2020-02-15 MED ORDER — SODIUM CHLORIDE 0.9 % IV SOLN
250.0000 mL | INTRAVENOUS | Status: DC | PRN
  Administered 2020-02-20 – 2020-02-21 (×2): 250 mL via INTRAVENOUS

## 2020-02-15 MED ORDER — SODIUM CHLORIDE 0.9% FLUSH
3.0000 mL | Freq: Two times a day (BID) | INTRAVENOUS | Status: DC
  Administered 2020-02-15: 3 mL via INTRAVENOUS

## 2020-02-15 MED ORDER — SODIUM CHLORIDE 0.9 % IV SOLN: 250.0000 mL | INTRAVENOUS | Status: DC | PRN

## 2020-02-15 MED ORDER — ONDANSETRON HCL 4 MG/2ML IJ SOLN: 4.0000 mg | Freq: Four times a day (QID) | INTRAMUSCULAR | Status: DC | PRN

## 2020-02-15 MED ORDER — INSULIN ASPART 100 UNIT/ML ~~LOC~~ SOLN
0.0000 [IU] | Freq: Three times a day (TID) | SUBCUTANEOUS | Status: DC
  Administered 2020-02-15: 5 [IU] via SUBCUTANEOUS
  Administered 2020-02-16 (×2): 3 [IU] via SUBCUTANEOUS
  Administered 2020-02-16 – 2020-02-17 (×2): 4 [IU] via SUBCUTANEOUS
  Administered 2020-02-17: 3 [IU] via SUBCUTANEOUS

## 2020-02-15 MED ORDER — ACETAMINOPHEN 325 MG PO TABS: 650.0000 mg | ORAL_TABLET | ORAL | Status: DC | PRN

## 2020-02-15 MED ORDER — INSULIN ASPART 100 UNIT/ML ~~LOC~~ SOLN
0.0000 [IU] | Freq: Every day | SUBCUTANEOUS | Status: DC
  Administered 2020-02-15: 2 [IU] via SUBCUTANEOUS

## 2020-02-15 MED ORDER — IPRATROPIUM-ALBUTEROL 0.5-2.5 (3) MG/3ML IN SOLN
3.0000 mL | Freq: Two times a day (BID) | RESPIRATORY_TRACT | Status: DC
  Administered 2020-02-15 – 2020-02-22 (×14): 3 mL via RESPIRATORY_TRACT
  Filled 2020-02-15 (×15): qty 3

## 2020-02-15 MED ORDER — HEPARIN (PORCINE) IN NACL 1000-0.9 UT/500ML-% IV SOLN
INTRAVENOUS | Status: AC
  Filled 2020-02-15: qty 500

## 2020-02-15 SURGICAL SUPPLY — 8 items
CATH SWAN GANZ 7F STRAIGHT (CATHETERS) ×1 IMPLANT
GLIDESHEATH SLENDER 7FR .021G (SHEATH) ×1 IMPLANT
KIT MICROPUNCTURE NIT STIFF (SHEATH) ×1 IMPLANT
PACK CARDIAC CATHETERIZATION (CUSTOM PROCEDURE TRAY) ×2 IMPLANT
PROTECTION STATION PRESSURIZED (MISCELLANEOUS) ×2
SHEATH PROBE COVER 6X72 (BAG) ×1 IMPLANT
STATION PROTECTION PRESSURIZED (MISCELLANEOUS) IMPLANT
TRANSDUCER W/STOPCOCK (MISCELLANEOUS) ×2 IMPLANT

## 2020-02-15 NOTE — Progress Notes (Signed)
Patient placed on CPAP 14 cmH2O via FFM with her 10 L HFNC under the mask.  Patient has a good seal and the patient vital signs are WNL; pt tolerating at this time. RT will continue to monitor.

## 2020-02-15 NOTE — Progress Notes (Signed)
VAST consulted to obtain 2nd IV access in R arm for cardiac cath later today. Upon speaking with patient, she reported she just got up to chair and her procedure is not until 1530. Advised VAST RN can return around 1300 to place IV access after pt back in bed.  Notified pt's nurse.

## 2020-02-15 NOTE — Progress Notes (Signed)
Physical Therapy Treatment Patient Details Name: [name] MRN: [id number] DOB: March 06, 1941 Today's Date: 02/15/2020    History of Present Illness 79 y.o. 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    PT Comments    Pt admitted with above diagnosis. Pt was able to ambulate with RW with min guard assist and cues.  Needs cues for energy conservation. Also needed 10LO2 to maintain sats today which is less than last visit.  Discussed energy conservation and taking short walks at home so that sats will not drop.  Still requiring a high level of O2.   Pt currently with functional limitations due to balance and endurance deficits. Pt will benefit from skilled PT to increase their independence and safety with mobility to allow discharge to the venue listed below.     Follow Up Recommendations  SNF;Supervision/Assistance - 24 hour     Equipment Recommendations  None recommended by PT    Recommendations for Other Services       Precautions / Restrictions Precautions Precautions: Fall Restrictions Weight Bearing Restrictions: No    Mobility  Bed Mobility Overal bed mobility: Needs Assistance Bed Mobility: Supine to Sit     Supine to sit: Min guard        Transfers Overall transfer level: Needs assistance Equipment used: Rolling walker (2 wheeled) Transfers: Sit to/from Stand Sit to Stand: Min guard;Min assist         General transfer comment: Min guard from elevated surface and Min A from low recliner.   Ambulation/Gait Ambulation/Gait assistance: Min guard;Min assist Gait Distance (Feet): 150 Feet Assistive device: Rolling  walker (2 wheeled) Gait Pattern/deviations: Step-through pattern;Decreased stride length;Trunk flexed;Drifts right/left   Gait velocity interpretation: <1.31 ft/sec, indicative of household ambulator General Gait Details: Pt needed cues in uncontrolled environment to steer RW.  Cues to stay close to RW as well.  Pt on 10LO2 on arrival at 92%.  Pulse ox with activity 88%  and > for most of walk.  desat at end of walk to 85% therefore informed pt she would need to limit distance to maintain sats above 88% in future and take rest breaks.    Stairs             Wheelchair Mobility    Modified Rankin (Stroke Patients Only)       Balance Overall balance assessment: Needs assistance Sitting-balance support: No upper extremity supported;Feet supported Sitting balance-Leahy Scale: Fair     Standing balance support: During functional activity;Bilateral upper extremity supported Standing balance-Leahy Scale: Poor Standing balance comment: relies on UE support for balance                            Cognition Arousal/Alertness: Awake/alert Behavior During Therapy: WFL for tasks assessed/performed Overall Cognitive Status: Within Functional Limits for tasks assessed                                        Exercises      General Comments General comments (skin integrity, edema, etc.): discussed energy conservation       Pertinent Vitals/Pain  Pain Assessment: No/denies pain    Home Living                      Prior Function            PT Goals (current goals can now be found in the care plan section) Progress towards PT goals: Progressing toward goals    Frequency    Min 2X/week      PT Plan Current plan remains appropriate    Co-evaluation              AM-PAC PT "6 Clicks" Mobility   Outcome Measure  Help needed turning from your back to your side while in a flat bed without using bedrails?: None Help needed moving  from lying on your back to sitting on the side of a flat bed without using bedrails?: None Help needed moving to and from a bed to a chair (including a wheelchair)?: A Little Help needed standing up from a chair using your arms (e.g., wheelchair or bedside chair)?: A Little Help needed to walk in hospital room?: A Little Help needed climbing 3-5 steps with a railing? : A Lot 6 Click Score: 19    End of Session Equipment Utilized During Treatment: Gait belt;Oxygen Activity Tolerance: Patient limited by fatigue Patient left: with call bell/phone within reach;with family/visitor present;in bed Nurse Communication: Mobility status PT Visit Diagnosis: Muscle weakness (generalized) (M62.81)     Time: 7793-9030 PT Time Calculation (min) (ACUTE ONLY): 17 min  Charges:  $Gait Training: 8-22 mins                     Daryan Cagley W,PT [place] Pager:  [phone]  Office:  [place] 02/15/2020, 3:26 PM

## 2020-02-15 NOTE — Progress Notes (Addendum)
PROGRESS NOTE    [name]  PJA:[id number] DOB: 1941/01/22 DOA: 02/11/2020 PCP: Girtha Rm, NP-C   Brief Narrative:  Patient is 79 year old female with history of chronic combined systolic/diastolic CHF, CKD stage IV, hypertension, OSA on CPAP, diabetes type 2 who was brought to the emergency room with complaints of shortness of breath, bilateral lower extremity swelling, weight gain.  Patient was found to have acute on chronic CHF exacerbation .  Cardiology consulted and following.  Plan for right heart cath and high resolution CT scan today.  Assessment & Plan:   Principal Problem:   Acute respiratory failure with hypoxia (HCC) Active Problems:   Essential hypertension   COPD (chronic obstructive pulmonary disease) (HCC)   OSA on CPAP   CKD (chronic kidney disease) stage 4, GFR 15-29 ml/min (HCC)   Coronary artery disease involving native coronary artery of native heart without angina pectoris   Acute on chronic combined systolic and diastolic CHF (congestive heart failure) (HCC)   CHF exacerbation (HCC)   Acute on chronic hypoxic respiratory failure: Multifactorial.  Underlying CHF, severe pulmonary hypertension and also restrictive lung disease .  This morning she was on high flow nasal cannula at 10L/min.  Continue to monitor respiratory status.  She is on oxygen at night at home at 2L/min.  Plan for high-resolution CT scan to evaluate the lung anatomy.  Acute on chronic systolic congestive heart failure: Echocardiogram done here showed ejection fraction of 45 to 50%, global hypokinesis, severely reduced right ventricular systolic function, elevated pulmonary artery systolic pressure.  Findings consistent with cor pulmonale.  Cardiology following.    Continue to monitor weight, input/output.  Diuretics have been held due to worsening kidney function.  Plan for right heart cath. Patient also has ICD for complete heart block  Pulmonary artery hypertension: Secondary to  chronic restrictive lung disease, obstructive sleep apnea.  Plan for right heart cath  Chronic respiratory insufficiency/restrictive lung disease: PFTs had shown significant restrictive lung disease with marked reduced DLCO.  Also has history of OSA on CPAP.  She says she is compliant with that.  Follows with pulmonology Dr. Halford Chessman. VQ scan done here did not show any pulmonary embolism.  Pulmonology consulted.  Steroids discontinued.  Paroxysmal atrial tachycardia: Cardiology following.  Continue current management  Hypertension: Monitor blood pressure.  Continue current regimen.  Coronary artery disease: Previous cardiac cath showed minor nonobstructive disease.  On statin, aspirin, beta-blocker.  CKD stage IIIb: Baseline creatinine ranges from 1.6-1.9.  Creatinine trended up slightly.We will continue to monitor.  Diuretics held  Diabetes type 2: Recent hemoglobin A1c of 6.  Currently diet-controlled only.  Currently hyperglycemic most likely secondary to steroids.  Continue current regimen  Debility/deconditioning: Patient seen by physical therapy and occupational therapy and recommended skilled nursing facility on discharge.             DVT prophylaxis: Lovenox Code Status: Full Family Communication: daughter at bedside Status is: Inpatient  Remains inpatient appropriate because:IV treatments appropriate due to intensity of illness or inability to take PO   Dispo: The patient is from: Home              Anticipated d/c is to: SNF              Anticipated d/c date is: 2 days              Patient currently is not medically stable to d/c.    Consultants: Cardiology  Procedures:None  Antimicrobials:  Anti-infectives (  From admission, onward)   None      Subjective: Patient seen and examined at the bedside this morning.  Hemodynamically stable.  Currently on 10 L of oxygen per minute, high flow.  She was not in acute respiratory distress.  There was report of  desaturation at night.  Denies cough or severe dyspnea.   Objective: Vitals:   02/14/20 1946 02/14/20 2048 02/15/20 0120 02/15/20 0628  BP:  (!) 126/86 (!) 113/55 (!) 127/64  Pulse:  63  59  Resp:  20  18  Temp:  98.4 F (36.9 C)  97.7 F (36.5 C)  TempSrc:  Oral  Oral  SpO2: 92% 98%  90%  Weight:    81.4 kg  Height:        Intake/Output Summary (Last 24 hours) at 02/15/2020 0755 Last data filed at 02/15/2020 0600 Gross per 24 hour  Intake 600 ml  Output 650 ml  Net -50 ml   Filed Weights   02/13/20 0350 02/14/20 0059 02/15/20 0628  Weight: 80.4 kg 80.6 kg 81.4 kg    Examination:  General exam:Obese,overall comfortable Respiratory system: Bilateral coarse crackles Cardiovascular system: S1 & S2 heard, RRR. No JVD, murmurs, rubs, gallops or clicks. Gastrointestinal system: Abdomen is nondistended, soft and nontender. No organomegaly or masses felt. Normal bowel sounds heard. Central nervous system: Alert and oriented. No focal neurological deficits. Extremities: No edema, no clubbing ,no cyanosis Skin: No rashes, lesions or ulcers,no icterus ,no pallor    Data Reviewed: I have personally reviewed following labs and imaging studies  CBC: Recent Labs  Lab 02/11/20 2014  WBC 4.0  HGB 11.1*  HCT 37.1  MCV 90.0  PLT 092   Basic Metabolic Panel: Recent Labs  Lab 02/11/20 2014 02/13/20 0439 02/14/20 0702 02/15/20 0428  NA 141 141 139 136  K 3.6 3.5 4.1 4.2  CL 103 104 101 99  CO2 _0 GLUCOSE 132* 203* 210* 310*  BUN 22 26* 37* 48*  CREATININE 1.80* 1.80* 1.98* 2.17*  CALCIUM 9.1 9.1 8.9 8.5*  MG 2.0  --   --   --    GFR: Estimated Creatinine Clearance: 20.1 mL/min (A) (by C-G formula based on SCr of 2.17 mg/dL (H)). Liver Function Tests: No results for input(s): AST, ALT, ALKPHOS, BILITOT, PROT, ALBUMIN in the last 168 hours. No results for input(s): LIPASE, AMYLASE in the last 168 hours. No results for input(s): AMMONIA in the last 168  hours. Coagulation Profile: No results for input(s): INR, PROTIME in the last 168 hours. Cardiac Enzymes: No results for input(s): CKTOTAL, CKMB, CKMBINDEX, TROPONINI in the last 168 hours. BNP (last 3 results) No results for input(s): PROBNP in the last 8760 hours. HbA1C: No results for input(s): HGBA1C in the last 72 hours. CBG: No results for input(s): GLUCAP in the last 168 hours. Lipid Profile: No results for input(s): CHOL, HDL, LDLCALC, TRIG, CHOLHDL, LDLDIRECT in the last 72 hours. Thyroid Function Tests: No results for input(s): TSH, T4TOTAL, FREET4, T3FREE, THYROIDAB in the last 72 hours. Anemia Panel: No results for input(s): VITAMINB12, FOLATE, FERRITIN, TIBC, IRON, RETICCTPCT in the last 72 hours. Sepsis Labs: No results for input(s): PROCALCITON, LATICACIDVEN in the last 168 hours.  Recent Results (from the past 240 hour(s))  SARS Coronavirus 2 by RT PCR (hospital order, performed in [facility] hospital lab) Nasopharyngeal Nasopharyngeal Swab     Status: None   Collection Time: 02/12/20  1:55 AM   Specimen: Nasopharyngeal Swab  Result Value Ref Range Status   SARS Coronavirus 2 NEGATIVE NEGATIVE Final    Comment: (NOTE) SARS-CoV-2 target nucleic acids are NOT DETECTED.  The SARS-CoV-2 RNA is generally detectable in upper and lower respiratory specimens during the acute phase of infection. The lowest concentration of SARS-CoV-2 viral copies this assay can detect is 250 copies / mL. A negative result does not preclude SARS-CoV-2 infection and should not be used as the sole basis for treatment or other patient management decisions.  A negative result may occur with improper specimen collection / handling, submission of specimen other than nasopharyngeal swab, presence of viral mutation(s) within the areas targeted by this assay, and inadequate number of viral copies (<250 copies / mL). A negative result must be combined with clinical observations, patient history,  and epidemiological information.  Fact Sheet for Patients:   StrictlyIdeas.no  Fact Sheet for Healthcare Providers: BankingDealers.co.za  This test is not yet approved or  cleared by the [place] FDA and has been authorized for detection and/or diagnosis of SARS-CoV-2 by FDA under an Emergency Use Authorization (EUA).  This EUA will remain in effect (meaning this test can be used) for the duration of the COVID-19 declaration under Section 564(b)(1) of the Act, 21 U.S.C. section 360bbb-3(b)(1), unless the authorization is terminated or revoked sooner.  Performed at [facility], [place] [address], [place], [place] [address]          Radiology Studies: DG Chest 2 View  Result Date: 02/14/2020 CLINICAL DATA:  79 year old female with history of shortness of breath and hypoxia. EXAM: CHEST - 2 VIEW COMPARISON:  Chest x-ray 02/11/2020. FINDINGS: Lung volumes are normal. No consolidative airspace disease. No pleural effusions. No evidence of pulmonary edema. Heart size is moderately enlarged. Upper mediastinal contours are within normal limits. Aortic atherosclerosis. Left-sided pacemaker/AICD with lead tips projecting over the expected location of the right atrium and right ventricle. IMPRESSION: 1. No radiographic evidence of acute cardiopulmonary disease. 2. Moderate cardiomegaly. 3. Aortic atherosclerosis. Electronically Signed   By: Vinnie Langton M.D.   On: 02/14/2020 16:06        Scheduled Meds: . amLODipine  10 mg Oral Daily  . aspirin  81 mg Oral QHS  . atorvastatin  40 mg Oral Daily  . budesonide (PULMICORT) nebulizer solution  0.5 mg Nebulization BID  . carvedilol  25 mg Oral BID WC  . enoxaparin (LOVENOX) injection  30 mg Subcutaneous Q24H  . ferrous sulfate  325 mg Oral Q breakfast  . furosemide  40 mg Intravenous BID  . hydrALAZINE  25 mg Oral Q8H  . ipratropium-albuterol  3 mL Nebulization TID  . mirabegron  ER  50 mg Oral Daily  . nitroGLYCERIN  1 inch Topical Q6H  . potassium chloride  20 mEq Oral Daily  . sodium chloride flush  3 mL Intravenous Q12H   Continuous Infusions: . sodium chloride       LOS: 3 days    Time spent: 35 mins,More than 50% of that time was spent in counseling and/or coordination of care.      Shelly Coss, MD [facility] P7/29/2021, 7:55 AM

## 2020-02-15 NOTE — Progress Notes (Signed)
RT placed patient on CPAP HS. 10L O2 bleed in needed. Patient tolerating well

## 2020-02-15 NOTE — Progress Notes (Signed)
Progress Note  Patient Name: [name] Date of Encounter: 02/15/2020  [facility] Cardiologist: Sanda Klein, MD   Subjective   Net diuresis 1.5 l, further worsening of BUN/creat. Weight reportedly increased, doubt accuracy. JVP elevated, but no edema. On 10 l O2 supplement during CPAP last night.  Inpatient Medications    Scheduled Meds: . amLODipine  10 mg Oral Daily  . aspirin  81 mg Oral QHS  . atorvastatin  40 mg Oral Daily  . budesonide (PULMICORT) nebulizer solution  0.5 mg Nebulization BID  . carvedilol  25 mg Oral BID WC  . enoxaparin (LOVENOX) injection  30 mg Subcutaneous Q24H  . ferrous sulfate  325 mg Oral Q breakfast  . furosemide  40 mg Intravenous BID  . hydrALAZINE  25 mg Oral Q8H  . insulin aspart  0-20 Units Subcutaneous TID WC  . insulin aspart  0-5 Units Subcutaneous QHS  . insulin glargine  10 Units Subcutaneous QHS  . ipratropium-albuterol  3 mL Nebulization TID  . mirabegron ER  50 mg Oral Daily  . nitroGLYCERIN  1 inch Topical Q6H  . potassium chloride  20 mEq Oral Daily  . sodium chloride flush  3 mL Intravenous Q12H   Continuous Infusions: . sodium chloride     PRN Meds: sodium chloride, acetaminophen, ondansetron (ZOFRAN) IV, polyethylene glycol, sodium chloride flush   Vital Signs    Vitals:   02/14/20 2048 02/15/20 0120 02/15/20 0628 02/15/20 0807  BP: (!) 126/86 (!) 113/55 (!) 127/64 128/82  Pulse: 63  59 61  Resp: _0 Temp: 98.4 F (36.9 C)  97.7 F (36.5 C) 98.5 F (36.9 C)  TempSrc: Oral  Oral Oral  SpO2: 98%  90% 94%  Weight:   81.4 kg   Height:        Intake/Output Summary (Last 24 hours) at 02/15/2020 0808 Last data filed at 02/15/2020 0600 Gross per 24 hour  Intake 600 ml  Output 650 ml  Net -50 ml   Last 3 Weights 02/15/2020 02/14/2020 02/13/2020  Weight (lbs) 179 lb 8 oz 177 lb 9.6 oz 177 lb 4.8 oz  Weight (kg) 81.421 kg 80.559 kg 80.423 kg      Telemetry    A paced V sensed  - Personally  Reviewed  ECG    No new tracing - Personally Reviewed  Physical Exam  Obese GEN: No acute distress.   Neck: 10 cm JVD Cardiac: RRR, no murmurs, rubs, or gallops.  Respiratory: Clear to auscultation bilaterally. GI: Soft, nontender, non-distended  MS: No edema; No deformity. Neuro:  Nonfocal  Psych: Normal affect   Labs    High Sensitivity Troponin:   Recent Labs  Lab 02/11/20 2014 02/11/20 2247  TROPONINIHS 17 18*      Chemistry Recent Labs  Lab 02/13/20 0439 02/14/20 0702 02/15/20 0428  NA 141 139 136  K 3.5 4.1 4.2  CL 104 101 99  CO2 _1 GLUCOSE 203* 210* 310*  BUN 26* 37* 48*  CREATININE 1.80* 1.98* 2.17*  CALCIUM 9.1 8.9 8.5*  GFRNONAA 26* 23* 21*  GFRAA 30* 27* 24*  ANIONGAP _2 Hematology Recent Labs  Lab 02/11/20 2014  WBC 4.0  RBC 4.12  HGB 11.1*  HCT 37.1  MCV 90.0  MCH 26.9  MCHC 29.9*  RDW 15.5  PLT 205    BNP Recent Labs  Lab 02/11/20 2014  BNP 617.2*  DDimer  Recent Labs  Lab 02/12/20 0220  DDIMER 1.07*     Radiology    DG Chest 2 View  Result Date: 02/14/2020 CLINICAL DATA:  78 year old female with history of shortness of breath and hypoxia. EXAM: CHEST - 2 VIEW COMPARISON:  Chest x-ray 02/11/2020. FINDINGS: Lung volumes are normal. No consolidative airspace disease. No pleural effusions. No evidence of pulmonary edema. Heart size is moderately enlarged. Upper mediastinal contours are within normal limits. Aortic atherosclerosis. Left-sided pacemaker/AICD with lead tips projecting over the expected location of the right atrium and right ventricle. IMPRESSION: 1. No radiographic evidence of acute cardiopulmonary disease. 2. Moderate cardiomegaly. 3. Aortic atherosclerosis. Electronically Signed   By: Vinnie Langton M.D.   On: 02/14/2020 16:06    Cardiac Studies   Echo 02/12/2020  1. Left ventricular ejection fraction, by estimation, is 45 to 50%. The  left ventricle has mildly decreased function.  The left ventricle  demonstrates global hypokinesis. Left ventricular diastolic function could  not be evaluated. There is the  interventricular septum is flattened in systole and diastole, consistent  with right ventricular pressure and volume overload.  2. Right ventricular systolic function is severely reduced. The right  ventricular size is severely enlarged. There is moderately elevated  pulmonary artery systolic pressure. The estimated right ventricular  systolic pressure is 09.3 mmHg.  3. Right atrial size was severely dilated.  4. The mitral valve is grossly normal. Mild mitral valve regurgitation.  No evidence of mitral stenosis.  5. The tricuspid valve is abnormal. Tricuspid valve regurgitation is  moderate to severe.  6. The aortic valve is tricuspid. Aortic valve regurgitation is not  visualized. No aortic stenosis is present.  7. The inferior vena cava is normal in size with greater than 50%  respiratory variability, suggesting right atrial pressure of 3 mmHg.   Patient Profile     79 y.o. female with a history of nonischemic cardiomyopathy (EF 45% echo 2016 and 2021), minor nonobstructive CAD (cath 2016), severe pulmonary hypertension, obstructive sleep apnea and restrictive lung disease, essential hypertension, hyperlipidemia and type 2 diabetes mellitus presenting with acute exacerbation of combined systolic and diastolic heart failure with both right and left heart failure features.  Assessment & Plan    1.CHF: Weight still substantially higher than her previous estimation of her "dry weight of 160-163 pounds, but renal parameters are worsening so I stopped theintravenous furosemide.Unchanged LV systolic function. The echo continues to show prominent changes of chronic cor pulmonale. The cardiac output is likely limited by RV dysfunction. 2.PAH:Presumed secondary to chronic restrictive lung disease and obstructive sleep apnea,since it could not be entirely  explained by elevation of left heart pressures for cardiac catheterization 2016. Not sure pulmonary vasodilators would help - may worsen VQ mismatch. Plan right heart catheterization w Dr. Haroldine Laws and hi-res chest CT. 3.Acute on chronic cor pulmonale:Mildly elevated D-dimer, but the perfusion scan was negative for pulmonary embolism. Clear chest x-ray. Gradual onset of symptoms and weight gain appears more typical for (right?) heart failure exacerbation.  4.Acute on chronic respiratory insufficiency: previously only on home O2 at night. Previous PFTs (2018) showed no obstruction but fairly significant restriction (65% of predicted) and markedly reduced DLCO (39%, corrected for alveolar volume).She reports compliance with CPAP, but her machine has been broken for the last roughly 1 month. This may have contributed to the heart failure exacerbation. Note that she is not polycythemic.  Incentive spirometry started.  5.Paroxysmal atrial tachycardia:None detected by her defibrillator since last office  visit in May. She has had one episode ofatrial tachycardia/slow atrial flutter at about 200 bpm with 3: 1 AV conduction lasting for about an hour, in May 2021. True atrial fibrillation has never been documented. 6. HTN:On carvedilol 25 mg twice daily and maximum dose amlodipine. Avoiding RAAS inhibitors due to her kidney problems. Hydralazine added. Receiving Nitropaste. BiDil is probably a good long-term choice.Note rather low diastolic blood pressure. Consider reducing beta blocker due to RV dysfunction, if CO is low on RHC. 7. BTD:HRCBU nonobstructive disease by previous heart catheterization. She does not have angina pectoris and her troponin levels are essentially normal. On statin and aspirin and beta-blocker. Note exceptional HDL cholesterol over 100 and LDL in the 30s. 8. Acute onCKD 3b:Creatinine increased to 2.17,  higher than her baseline, previously documented at  1.6-1.9. Diuretics stopped. 9. DM: Diet controlledcurrently, recent hemoglobin A1c 6.0% without medications.  10. LAG:TXMIWO device function ([employer] Sprint Fidelis 6949-lead under advisory with normal lead impedance and sensing).   Asked Dr. Haroldine Laws to evaluate for RHC, potentially later this PM. This procedure has been fully reviewed with the patient and written informed consent has been obtained. Hi-res CT chest ordered, as well as screening labs for inflammatory/autoimmune disease.  For questions or updates, please contact [place] Please consult [email or web address] for contact info under        Signed, Sanda Klein, MD  02/15/2020, 8:08 AM

## 2020-02-15 NOTE — Interval H&P Note (Signed)
History and Physical Interval Note:  02/15/2020 4:43 PM  [name]  has presented today for surgery, with the diagnosis of PAH.  The various methods of treatment have been discussed with the patient and family. After consideration of risks, benefits and other options for treatment, the patient has consented to  Procedure(s): RIGHT HEART CATH (N/A) as a surgical intervention.  The patient's history has been reviewed, patient examined, no change in status, stable for surgery.  I have reviewed the patient's chart and labs.  Questions were answered to the patient's satisfaction.     Rasul Decola

## 2020-02-15 NOTE — Progress Notes (Signed)
PCCM Progress Note.  Subjective: Walked in hall.  Got short of breath after about 150 feet.  Needing 8 to 10 liters oxygen.  Denies chest/abdominal pain.  BP (!) 120/60   Pulse 60   Temp 98.3 F (36.8 C) (Oral)   Resp 16   Ht _0  (1.575 m)   Wt 81.4 kg   SpO2 95%   BMI 32.83 kg/m   Alert.  HR regular.  Decreased BS.  Abdomen soft.  Ankle edema.  CMP Latest Ref Rng & Units 02/15/2020 02/14/2020 02/13/2020  Glucose 70 - 99 mg/dL 310(H) 210(H) 203(H)  BUN 8 - 23 mg/dL 48(H) 37(H) 26(H)  Creatinine 0.44 - 1.00 mg/dL 2.17(H) 1.98(H) 1.80(H)  Sodium 135 - 145 mmol/L 136 139 141  Potassium 3.5 - 5.1 mmol/L 4.2 4.1 3.5  Chloride 98 - 111 mmol/L 99 101 104  CO2 22 - 32 mmol/L _1 Calcium 8.9 - 10.3 mg/dL 8.5(L) 8.9 9.1  Total Protein 6.0 - 8.5 g/dL - - -  Total Bilirubin 0.0 - 1.2 mg/dL - - -  Alkaline Phos 39 - 117 IU/L - - -  AST 0 - 40 IU/L - - -  ALT 0 - 32 IU/L - - -    CBC Latest Ref Rng & Units 02/11/2020 04/04/2019 08/25/2018  WBC 4.0 - 10.5 K/uL 4.0 3.5 5.1  Hemoglobin 12.0 - 15.0 g/dL 11.1(L) 12.4 13.0  Hematocrit 36 - 46 % 37.1 38.7 41.3  Platelets 150 - 400 K/uL 205 227 258    A/p:  Acute on chronic hypoxic/hypercapnic respiratory failure. - combination of acute on chronic combined CHF, sleep disordered breathing, COPD, WHO group 2 and 3 pulmonary hypertension, and acute on chronic cor pulmonale - goal SpO2 90 to 95% - auto CPAP qhs - negative fluid balance as able - pulmicort, duoneb - RHC per cardiology scheduled  Updated pt's family at bedside.  [name], MD [facility] Pager - [phone] 02/15/2020, 1:58 PM

## 2020-02-15 NOTE — Progress Notes (Signed)
Patient CBG at lunch time 264 , patient refuse to take 11 units of novolog insulin patient stated is due to her blood sugar drop too low while NPO,t MD notified via text page also patient daughter at the bedside aware and agree to 5 units of novolog insulin. Will continue to monitor the patient.

## 2020-02-16 ENCOUNTER — Encounter (HOSPITAL_COMMUNITY): Payer: Self-pay | Admitting: Internal Medicine

## 2020-02-16 DIAGNOSIS — J9601 Acute respiratory failure with hypoxia: Secondary | ICD-10-CM | POA: Diagnosis not present

## 2020-02-16 DIAGNOSIS — I5023 Acute on chronic systolic (congestive) heart failure: Secondary | ICD-10-CM

## 2020-02-16 DIAGNOSIS — I2510 Atherosclerotic heart disease of native coronary artery without angina pectoris: Secondary | ICD-10-CM | POA: Diagnosis not present

## 2020-02-16 DIAGNOSIS — I5043 Acute on chronic combined systolic (congestive) and diastolic (congestive) heart failure: Secondary | ICD-10-CM | POA: Diagnosis not present

## 2020-02-16 DIAGNOSIS — N184 Chronic kidney disease, stage 4 (severe): Secondary | ICD-10-CM | POA: Diagnosis not present

## 2020-02-16 LAB — BASIC METABOLIC PANEL
Anion gap: 9 (ref 5–15)
BUN: 49 mg/dL — ABNORMAL HIGH (ref 8–23)
CO2: 28 mmol/L (ref 22–32)
Calcium: 8.3 mg/dL — ABNORMAL LOW (ref 8.9–10.3)
Chloride: 102 mmol/L (ref 98–111)
Creatinine, Ser: 1.88 mg/dL — ABNORMAL HIGH (ref 0.44–1.00)
GFR calc Af Amer: 29 mL/min — ABNORMAL LOW (ref >60)
GFR calc non Af Amer: 25 mL/min — ABNORMAL LOW (ref >60)
Glucose, Bld: 155 mg/dL — ABNORMAL HIGH (ref 70–99)
Potassium: 4.2 mmol/L (ref 3.5–5.1)
Sodium: 139 mmol/L (ref 135–145)

## 2020-02-16 LAB — GLUCOSE, CAPILLARY
Glucose-Capillary: 146 mg/dL — ABNORMAL HIGH (ref 70–99)
Glucose-Capillary: 146 mg/dL — ABNORMAL HIGH (ref 70–99)
Glucose-Capillary: 176 mg/dL — ABNORMAL HIGH (ref 70–99)
Glucose-Capillary: 112 mg/dL — ABNORMAL HIGH (ref 70–99)

## 2020-02-16 LAB — ANA W/REFLEX IF POSITIVE: Anti Nuclear Antibody (ANA): NEGATIVE

## 2020-02-16 LAB — RHEUMATOID FACTOR: Rheumatoid fact SerPl-aCnc: 22.9 IU/mL — ABNORMAL HIGH (ref 0.0–13.9)

## 2020-02-16 MED ORDER — TORSEMIDE 20 MG PO TABS
40.0000 mg | ORAL_TABLET | Freq: Every day | ORAL | Status: DC
  Administered 2020-02-16 – 2020-02-20 (×5): 40 mg via ORAL
  Filled 2020-02-16 (×5): qty 2

## 2020-02-16 MED ORDER — TORSEMIDE 20 MG PO TABS
20.0000 mg | ORAL_TABLET | Freq: Every day | ORAL | Status: DC
  Administered 2020-02-16 – 2020-02-19 (×4): 20 mg via ORAL
  Filled 2020-02-16 (×4): qty 1

## 2020-02-16 NOTE — Progress Notes (Signed)
Benefit Check  Med- Sildenafil   SILDENAFIL 69m. TID not covered. The Dr. can call [phone] pharmacy if there's no other alternative  medication.

## 2020-02-16 NOTE — Progress Notes (Signed)
PROGRESS NOTE    [name]  TDV:[id number] DOB: Oct 17, 1940 DOA: 02/11/2020 PCP: Girtha Rm, NP-C   Brief Narrative:  Patient is 79 year old female with history of chronic combined systolic/diastolic CHF, CKD stage IV, hypertension, OSA on CPAP, diabetes type 2 who was brought to the emergency room with complaints of shortness of breath, bilateral lower extremity swelling, weight gain.  Patient was found to have acute on chronic CHF exacerbation .  Cardiology consulted and following.  Underwent  right heart cath on 02/14/30.  Continues to require 9 to 10 L of oxygen per minute.  Assessment & Plan:   Principal Problem:   Acute respiratory failure with hypoxia (HCC) Active Problems:   Essential hypertension   COPD (chronic obstructive pulmonary disease) (HCC)   OSA on CPAP   CKD (chronic kidney disease) stage 4, GFR 15-29 ml/min (HCC)   Coronary artery disease involving native coronary artery of native heart without angina pectoris   Acute on chronic combined systolic and diastolic CHF (congestive heart failure) (HCC)   CHF exacerbation (HCC)   Acute on chronic hypoxic respiratory failure: Multifactorial.  Underlying CHF, severe pulmonary hypertension,COPD and also possible restrictive lung disease .  This morning she was on nasal cannula at 10L/min.  Continue to monitor respiratory status.  She is on oxygen at night at home at 2L/min.  High-resolution CT scan did not show any fibrotic lung disease  Acute on chronic systolic congestive heart failure: Presented with shortness of breath, lower extremity swelling. echocardiogram done here showed ejection fraction of 45 to 50%, global hypokinesis, severely reduced right ventricular systolic function, elevated pulmonary artery systolic pressure.  Findings consistent with cor pulmonale.  Cardiology following.    Continue to monitor weight, input/output.  Diuretics have been held due to worsening kidney function.  Underwent right heart  catheterization which showed severe pulmonary hypertension .patient also has ICD for complete heart block  Pulmonary artery hypertension:Likely secondary to  chronic restrictive lung disease, obstructive sleep apnea.  Right heart catheterization done which showed severe pulmonary artery hypertension.  Started on nitroglycerin patch, sildenafil  Chronic respiratory insufficiency/restrictive lung disease: PFTs had shown significant restrictive lung disease with marked reduced DLCO.  Also has history of OSA on CPAP.  She says she is compliant with that.  Follows with pulmonology Dr. Halford Chessman. VQ scan done here did not show any pulmonary embolism.  Pulmonology following.  Paroxysmal atrial tachycardia: Cardiology following.  Continue current management  Hypertension: Monitor blood pressure.  Continue current regimen.  Coronary artery disease: Previous cardiac cath showed minor nonobstructive disease.  On statin, aspirin, beta-blocker.  CKD stage IIIb: Baseline creatinine ranges from 1.6-1.9.  We will continue to monitor.  Diuretics on hold  Diabetes type 2: Recent hemoglobin A1c of 6.  Currently diet-controlled only.  Currently hyperglycemic most likely secondary to steroids.  Continue current regimen  Debility/deconditioning: Patient seen by physical therapy and occupational therapy and recommended skilled nursing facility on discharge.             DVT prophylaxis: Lovenox Code Status: Full Family Communication: daughter at bedside on 02/15/20 Status is: Inpatient  Remains inpatient appropriate because:IV treatments appropriate due to intensity of illness or inability to take PO   Dispo: The patient is from: Home              Anticipated d/c is to: SNF              Anticipated d/c date is: Not sure for now  Patient currently is not medically stable to d/c.  Is still on very high oxygen requirement, needs cardiology clearance before discharge   Consultants:  Cardiology  Procedures:None  Antimicrobials:  Anti-infectives (From admission, onward)   None      Subjective: Patient seen and examined at the bedside this morning.  Hemodynamically stable.  She says she feels better today.  Denies any cough or severe shortness of breath.  Still on 10 L of oxygen per minute.   Objective: Vitals:   02/15/20 2005 02/15/20 2324 02/16/20 0400 02/16/20 0600  BP:  (!) 124/61 126/75   Pulse:  60 60   Resp:  16 17   Temp:   97.7 F (36.5 C)   TempSrc:   Oral   SpO2: 92% 95% 96%   Weight:    82.4 kg  Height:        Intake/Output Summary (Last 24 hours) at 02/16/2020 0808 Last data filed at 02/16/2020 3149 Gross per 24 hour  Intake 495.49 ml  Output 300 ml  Net 195.49 ml   Filed Weights   02/14/20 0059 02/15/20 0628 02/16/20 0600  Weight: 80.6 kg 81.4 kg 82.4 kg    Examination:  General exam: Deconditioned, debilitated, not in distress Respiratory system: Bilateral crackles  cardiovascular system: S1 & S2 heard, RRR. No JVD, murmurs, rubs, gallops or clicks. Gastrointestinal system: Abdomen is nondistended, soft and nontender. No organomegaly or masses felt. Normal bowel sounds heard. Central nervous system: Alert and oriented. No focal neurological deficits. Extremities: No edema, no clubbing ,no cyanosis Skin: No rashes, lesions or ulcers,no icterus ,no pallor  Data Reviewed: I have personally reviewed following labs and imaging studies  CBC: Recent Labs  Lab 02/11/20 2014 02/15/20 1713 02/15/20 1717 02/15/20 1818  WBC 4.0  --   --  6.5  HGB 11.1* 10.2*  12.6 12.2 11.3*  HCT 37.1 30.0*  37.0 36.0 36.9  MCV 90.0  --   --  88.1  PLT 205  --   --  702   Basic Metabolic Panel: Recent Labs  Lab 02/11/20 2014 02/11/20 2014 02/13/20 0439 02/13/20 0439 02/14/20 0702 02/15/20 0428 02/15/20 1713 02/15/20 1717 02/15/20 1818 02/16/20 0110  NA 141   < > 141   < > 139 136 151*  142 143  --  139  K 3.6   < > 3.5   < > 4.1 4.2  3.1*  4.1 3.9  --  4.2  CL 103  --  104  --  101 99  --   --   --  102  CO2 30  --  27  --  26 28  --   --   --  28  GLUCOSE 132*  --  203*  --  210* 310*  --   --   --  155*  BUN 22  --  26*  --  37* 48*  --   --   --  49*  CREATININE 1.80*   < > 1.80*  --  1.98* 2.17*  --   --  2.04* 1.88*  CALCIUM 9.1  --  9.1  --  8.9 8.5*  --   --   --  8.3*  MG 2.0  --   --   --   --   --   --   --   --   --    < > = values in this interval not displayed.   GFR: Estimated Creatinine Clearance: 23.3  mL/min (A) (by C-G formula based on SCr of 1.88 mg/dL (H)). Liver Function Tests: No results for input(s): AST, ALT, ALKPHOS, BILITOT, PROT, ALBUMIN in the last 168 hours. No results for input(s): LIPASE, AMYLASE in the last 168 hours. No results for input(s): AMMONIA in the last 168 hours. Coagulation Profile: No results for input(s): INR, PROTIME in the last 168 hours. Cardiac Enzymes: No results for input(s): CKTOTAL, CKMB, CKMBINDEX, TROPONINI in the last 168 hours. BNP (last 3 results) No results for input(s): PROBNP in the last 8760 hours. HbA1C: Recent Labs    02/15/20 0702  HGBA1C 6.5*   CBG: Recent Labs  Lab 02/15/20 1125 02/15/20 1630 02/15/20 1821 02/15/20 2108 02/16/20 0553  GLUCAP 264* 148* 130* 220* 146*   Lipid Profile: No results for input(s): CHOL, HDL, LDLCALC, TRIG, CHOLHDL, LDLDIRECT in the last 72 hours. Thyroid Function Tests: No results for input(s): TSH, T4TOTAL, FREET4, T3FREE, THYROIDAB in the last 72 hours. Anemia Panel: No results for input(s): VITAMINB12, FOLATE, FERRITIN, TIBC, IRON, RETICCTPCT in the last 72 hours. Sepsis Labs: No results for input(s): PROCALCITON, LATICACIDVEN in the last 168 hours.  Recent Results (from the past 240 hour(s))  SARS Coronavirus 2 by RT PCR (hospital order, performed in [facility] hospital lab) Nasopharyngeal Nasopharyngeal Swab     Status: None   Collection Time: 02/12/20  1:55 AM   Specimen: Nasopharyngeal Swab   Result Value Ref Range Status   SARS Coronavirus 2 NEGATIVE NEGATIVE Final    Comment: (NOTE) SARS-CoV-2 target nucleic acids are NOT DETECTED.  The SARS-CoV-2 RNA is generally detectable in upper and lower respiratory specimens during the acute phase of infection. The lowest concentration of SARS-CoV-2 viral copies this assay can detect is 250 copies / mL. A negative result does not preclude SARS-CoV-2 infection and should not be used as the sole basis for treatment or other patient management decisions.  A negative result may occur with improper specimen collection / handling, submission of specimen other than nasopharyngeal swab, presence of viral mutation(s) within the areas targeted by this assay, and inadequate number of viral copies (<250 copies / mL). A negative result must be combined with clinical observations, patient history, and epidemiological information.  Fact Sheet for Patients:   StrictlyIdeas.no  Fact Sheet for Healthcare Providers: BankingDealers.co.za  This test is not yet approved or  cleared by the [place] FDA and has been authorized for detection and/or diagnosis of SARS-CoV-2 by FDA under an Emergency Use Authorization (EUA).  This EUA will remain in effect (meaning this test can be used) for the duration of the COVID-19 declaration under Section 564(b)(1) of the Act, 21 U.S.C. section 360bbb-3(b)(1), unless the authorization is terminated or revoked sooner.  Performed at [facility], [place] [address] [place], [place] [address]          Radiology Studies: DG Chest 2 View  Result Date: 02/14/2020 CLINICAL DATA:  79 year old female with history of shortness of breath and hypoxia. EXAM: CHEST - 2 VIEW COMPARISON:  Chest x-ray 02/11/2020. FINDINGS: Lung volumes are normal. No consolidative airspace disease. No pleural effusions. No evidence of pulmonary edema. Heart size is moderately enlarged.  Upper mediastinal contours are within normal limits. Aortic atherosclerosis. Left-sided pacemaker/AICD with lead tips projecting over the expected location of the right atrium and right ventricle. IMPRESSION: 1. No radiographic evidence of acute cardiopulmonary disease. 2. Moderate cardiomegaly. 3. Aortic atherosclerosis. Electronically Signed   By: Vinnie Langton M.D.   On: 02/14/2020 16:06  CARDIAC CATHETERIZATION  Result Date: 02/15/2020 Findings: RA = 20 RV = 72/27 PA = 79/44 (53) PCW = 18 Fick cardiac output/index = 5.35/2.9 Thermo CO/CI = 4.4/2.4 PVR = 6.5 (Fick) 8.0 (Thermo) Ao sat = 94% PA sat = 64%, 64% SVC sat = 61% Assessment: 1. Severe PAH with normal left-sided pressures Plan/Discussion: There is near equalization or diastolic pressures which can reflect restrictive physiology or elevated R-sided pressure with normal left pressures. Given relatively normal PCWP - restriction felt to be less likely. Glori Bickers, MD 5:36 PM  CT Chest High Resolution  Result Date: 02/15/2020 CLINICAL DATA:  Interstitial lung disease, pulmonary hypertension EXAM: CT CHEST WITHOUT CONTRAST TECHNIQUE: Multidetector CT imaging of the chest was performed following the standard protocol without intravenous contrast. High resolution imaging of the lungs, as well as inspiratory and expiratory imaging, was performed. COMPARISON:  None. FINDINGS: Cardiovascular: Aortic atherosclerosis. Cardiomegaly. Scattered Three-vessel coronary artery calcifications. Left chest multi lead pacer. Enlargement of the main pulmonary artery up to 3.6 cm. No pericardial effusion. Mediastinum/Nodes: No enlarged mediastinal, hilar, or axillary lymph nodes. Thyroid gland, trachea, and esophagus demonstrate no significant findings. Lungs/Pleura: Small bilateral pleural effusions and associated atelectasis or consolidation. No evidence of fibrotic interstitial lung disease. No significant air trapping on expiratory phase imaging. Upper  Abdomen: No acute abnormality. Musculoskeletal: No chest wall mass or suspicious bone lesions identified. Anasarca. Disc degenerative disease and ankylosis of the thoracic spine IMPRESSION: 1. No evidence of fibrotic interstitial lung disease, however examination of the lung bases is limited by the presence of pleural effusions. Follow-up examination could be used to assess for mild bibasilar fibrotic interstitial lung disease at the resolution of pleural effusions, if warranted by ongoing suspicion for fibrotic interstitial lung disease. 2. Small bilateral pleural effusions and associated atelectasis or consolidation. 3. Cardiomegaly and coronary artery disease. 4. Enlargement of the main pulmonary artery, as can be seen in pulmonary hypertension. 5. Anasarca. 6. Aortic Atherosclerosis (ICD10-I70.0). Electronically Signed   By: Eddie Candle M.D.   On: 02/15/2020 20:28        Scheduled Meds: . amLODipine  10 mg Oral Daily  . aspirin  81 mg Oral QHS  . atorvastatin  40 mg Oral Daily  . budesonide (PULMICORT) nebulizer solution  0.5 mg Nebulization BID  . carvedilol  25 mg Oral BID WC  . enoxaparin (LOVENOX) injection  30 mg Subcutaneous Q24H  . ferrous sulfate  325 mg Oral Q breakfast  . hydrALAZINE  25 mg Oral Q8H  . insulin aspart  0-20 Units Subcutaneous TID WC  . insulin aspart  0-5 Units Subcutaneous QHS  . ipratropium-albuterol  3 mL Nebulization BID  . mirabegron ER  50 mg Oral Daily  . nitroGLYCERIN  1 inch Topical Q6H  . potassium chloride  20 mEq Oral Daily  . sildenafil  20 mg Oral TID  . sodium chloride flush  3 mL Intravenous Q12H  . sodium chloride flush  3 mL Intravenous Q12H  . sodium chloride flush  3 mL Intravenous Q12H   Continuous Infusions: . sodium chloride    . sodium chloride    . sodium chloride       LOS: 4 days    Time spent: 35 mins,More than 50% of that time was spent in counseling and/or coordination of care.      Shelly Coss, MD [facility] P7/30/2021, 8:08 AM

## 2020-02-16 NOTE — Progress Notes (Signed)
Occupational Therapy Treatment Patient Details Name: [name] MRN: [id number] DOB: October 29, 1940 Today's Date: 02/16/2020    History of present illness 79 y.o. 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   OT comments  Pt progressing with transfers, ability to care for self and mobility with RW. Pt requiring cues for hand placement to ensure safe descent for transfers. Pt increasing to minguardA for LB ADL. Pt continues to be limited by fatigue and weakness.  Pt given BUE HEP with pursed lip breathing to ensure decrease in holding breath.  Topics without energy conservation discussed. Pt could benefit from continued OT skilled services for ADL, energy conservation and safety. OT Following acutely.    Follow Up Recommendations  SNF    Equipment Recommendations   (defer)    Recommendations for Other Services      Precautions / Restrictions Precautions Precautions: Fall Restrictions Weight Bearing Restrictions: No       Mobility Bed Mobility Overal bed mobility: Needs Assistance Bed Mobility: Supine to Sit     Supine to sit: Supervision        Transfers Overall transfer level: Needs assistance Equipment used: Rolling walker (2 wheeled) Transfers: Sit to/from Stand Sit to Stand: Min assist         General transfer comment: minA overall from recliner with cues for hand placement    Balance Overall balance assessment: Needs assistance Sitting-balance support: No upper extremity supported;Feet supported Sitting balance-Leahy Scale: Fair     Standing balance support: During functional activity;Single extremity  supported Standing balance-Leahy Scale: Poor Standing balance comment: relies on UE support for balance; single UE for grooming tasks at sink                           ADL either performed or assessed with clinical judgement   ADL Overall ADL's : Needs assistance/impaired     Grooming: Set up;Sitting               Lower Body Dressing: Min guard;Cueing for safety;Sitting/lateral leans;Sit to/from stand Lower Body Dressing Details (indicate cue type and reason): figure 4 and lifting LE onto bed to assist; pt simulating pulling pants to waist.             Functional mobility during ADLs: Min guard;Rolling walker;Cueing for safety       Vision   Vision Assessment?: No apparent visual deficits   Perception     Praxis      Cognition Arousal/Alertness: Awake/alert Behavior During Therapy: WFL for tasks assessed/performed Overall Cognitive Status: Within Functional Limits for tasks assessed                                          Exercises Exercises: Other exercises Other Exercises Other Exercises: shoulder flex, elbow flex/ext, hand squeezes, shoulder abduction x10 reps Other Exercises: pursed lip breathing for tasks   Shoulder Instructions       General Comments energy conservation and HEP for deep breathing    Pertinent Vitals/ Pain       Pain Assessment: No/denies pain  Home Living  Prior Functioning/Environment              Frequency  Min 2X/week        Progress Toward Goals  OT Goals(current goals can now be found in the care plan section)  Progress towards OT goals: Progressing toward goals  Acute Rehab OT Goals Patient Stated Goal: Patient requesting short-term SNF rehab.  OT Goal Formulation: With patient Time For Goal Achievement: 02/27/20 Potential to Achieve Goals: Good ADL Goals Pt Will Perform Grooming: with modified  independence;standing Pt Will Perform Lower Body Bathing: with modified independence;sit to/from stand;sitting/lateral leans;with adaptive equipment Pt Will Perform Lower Body Dressing: with modified independence;with adaptive equipment;sitting/lateral leans;sit to/from stand Pt Will Transfer to Toilet: with modified independence;ambulating;bedside commode Pt Will Perform Toileting - Clothing Manipulation and hygiene: with modified independence;sitting/lateral leans;sit to/from stand Additional ADL Goal #1: Patient will recall and demonstrate use of 3 energy conservation techniques in prep for BADLs.  Plan Discharge plan remains appropriate    Co-evaluation                 AM-PAC OT "6 Clicks" Daily Activity     Outcome Measure   Help from another person eating meals?: None Help from another person taking care of personal grooming?: A Little Help from another person toileting, which includes using toliet, bedpan, or urinal?: A Little Help from another person bathing (including washing, rinsing, drying)?: A Lot Help from another person to put on and taking off regular upper body clothing?: None Help from another person to put on and taking off regular lower body clothing?: A Little 6 Click Score: 19    End of Session Equipment Utilized During Treatment: Gait belt;Rolling walker  OT Visit Diagnosis: Unsteadiness on feet (R26.81);Muscle weakness (generalized) (M62.81)   Activity Tolerance Patient tolerated treatment well   Patient Left in chair;with call bell/phone within reach;with chair alarm set;with family/visitor present   Nurse Communication Mobility status        Time: 1010-1030 OT Time Calculation (min): 20 min  Charges: OT General Charges $OT Visit: 1 Visit OT Treatments $Self Care/Home Management : 8-22 mins  [name], OTR/L Acute Rehabilitation Services Pager: [phone] Office: [phone]    [name] C 02/16/2020, 4:21 PM

## 2020-02-16 NOTE — Progress Notes (Signed)
RT placed patient on CPAP HS. Patient also has her HFNC 10L in. Patient tolerating well at this time.

## 2020-02-16 NOTE — TOC Initial Note (Signed)
Transition of Care [facility]) - Initial/Assessment Note    Patient Details  Name: [name] MRN: [id number] Date of Birth: [birth year]  Transition of Care [facility]) CM/SW Contact:    Jacquelynn Cree Phone Number: 02/16/2020, 2:31 PM  Clinical Narrative:                 CSW met with patient and daughter [name] bedside to discuss PT recommendation for SNF placement at discharge. Patient provided permission for Ms. Handler to be involved with discussion. PTA, patient lived alone in a single story resident and was independent with the use of a cane. Patient also has a rollator that would be used at times. Patient was independent with all ADLs.  Patient and Ms. Tamm at in agreement with SNF placement at discharge, providing permission for patient to be faxed out to [facility]. CSW explained insurance authorization process and informed Ms. [name] of where to locate Medicare.gov ratings. Patient is fully vaccinated.  Expected Discharge Plan: Skilled Nursing Facility Barriers to Discharge: Continued Medical Work up, Ship broker   Patient Goals and CMS Choice   CMS Medicare.gov Compare Post Acute Care list provided to:: Patient Choice offered to / list presented to : Patient  Expected Discharge Plan and Services Expected Discharge Plan: [place] arrangements for the past 2 months: Single Family Home                                      Prior Living Arrangements/Services Living arrangements for the past 2 months: Single Family Home Lives with:: Self Patient language and need for interpreter reviewed:: Yes Do you feel safe going back to the place where you live?: Yes      Need for Family Participation in Patient Care: No (Comment) Care giver support system in place?: Yes (comment)   Criminal Activity/Legal Involvement Pertinent to Current Situation/Hospitalization: No - Comment as needed  Activities of Daily Living       Permission Sought/Granted Permission sought to share information with : Facility Sport and exercise psychologist, Family Supports Permission granted to share information with : Yes, Verbal Permission Granted  Share Information with NAME: [name]  Permission granted to share info w AGENCY: SNFs  Permission granted to share info w Relationship: Daughter  Permission granted to share info w Contact Information: [phone]  Emotional Assessment Appearance:: Appears stated age Attitude/Demeanor/Rapport: Engaged Affect (typically observed): Pleasant Orientation: : Oriented to  Time, Oriented to Situation, Oriented to Place, Oriented to Self Alcohol / Substance Use: Not Applicable Psych Involvement: No (comment)  Admission diagnosis:  CHF exacerbation ([place]) [I50.9] Acute on chronic combined systolic and diastolic CHF (congestive heart failure) (HCC) [I50.43] Acute on chronic congestive heart failure, unspecified heart failure type [facility]) [I50.9] Patient Active Problem List   Diagnosis Date Noted  . Acute on chronic combined systolic and diastolic CHF (congestive heart failure) ([place]) 02/12/2020  . Acute respiratory failure with hypoxia ([place]) 02/12/2020  . CHF exacerbation ([place]) 02/12/2020  . D dimer value normal   . Diabetes mellitus type 2, diet-controlled ([place])   . Abnormal result of cardiovascular function study, unspecified 09/16/2018  . Callus 09/16/2018  . Claudication of both lower extremities ([place]) 09/16/2018  . Dependence on other enabling machines and devices 09/16/2018  . Dizziness 09/16/2018  . Dyspnea, unspecified 09/16/2018  . Medicare annual wellness visit, subsequent 09/16/2018  . Onychomycosis due  to dermatophyte 09/16/2018  . Overgrown toenails 09/16/2018  . 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 Iron deficiency anemia   . On home O2   . Benign neoplasm of colon   . PAH (pulmonary artery hypertension) ([place]) 09/09/2017  . PAT (paroxysmal atrial  tachycardia) ([place]) 09/09/2017  . Hypercholesterolemia 09/09/2017  . OSA on CPAP 09/08/2017  . 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 Type 2 diabetes, controlled, with peripheral neuropathy ([place])   . Other cardiomyopathies ([place])   . Chronic combined systolic (congestive) and diastolic (congestive) heart failure ([place])   . CKD (chronic kidney disease) stage 4, GFR 15-29 ml/min (HCC)   . ICD (implantable cardioverter-defibrillator) in place   . Urge incontinence of urine   . Coronary artery disease involving native coronary artery of native heart without angina pectoris   . Chronic kidney disease 09/06/2017  . Restrictive lung disease 05/04/2017  . Body mass index (bmi) 33.0-33.9, adult 06/13/2014  . DEPRESSION 07/26/2007  . Essential hypertension 07/26/2007  . CARDIOMYOPATHY 07/26/2007  . COPD (chronic obstructive pulmonary disease) ([place]) 07/26/2007   PCP:  Girtha Rm, NP-C Pharmacy:   [facility] [phone] [place], [place] [address] [place] [address] Phone: [phone] Fax: [facility] [place], [place] [address] Phone: [phone] Fax: [phone]     Social Determinants of Health (SDOH) Interventions    Readmission Risk Interventions No flowsheet data found.

## 2020-02-16 NOTE — Progress Notes (Signed)
Family concern of Nitro patch Q6H and sildenafil to start at 10 am. Spoke to Pharmacist and he Ok'd  To give the [employer] and address this to Physician in AM. BP monitored. Will skip  6'O clock Nitro and will address this on day shift. Family agreed.

## 2020-02-16 NOTE — Consult Note (Addendum)
Advanced Heart Failure Team Consult Note   Primary Physician: Erin Rm, NP-C PCP-Cardiologist:  Sanda Klein, MD  Reason for Consultation: [facility] + right sided heart failure   HPI:    [name] is seen today for evaluation of acute on chronic systolic heart failure w/ biventicular dysfunction and PAH at the request of Dr. [name], Cardiology.  79 y/o AAF w/ a long standing history of chronic systolic heart failure/ NICM and pulmonary hypertension. Per records, her systolic HF dates back to 2006. Mild nonobstructive CAD by LHC in 2006 and again in 2015. She is s/p Metronic ICD, also w/ restrictive lung disease, OSA on CPAP, essential hypertension, CKD, HLD and T2DM. Her daughter is a Therapist, sports for [employer].   Most recent echo prior to this admit was in 2016 and showed LVEF 45% w/ mild AS, normal RV but severe pulmonary HTN, PASP 70. She has been followed by Dr. Halford Chessman for restrictive lung disease. Previous PFTs(2018)showed no obstruction but fairly significant restriction (65% of predicted)and markedly reduced DLCO(39%, corrected for alveolar volume).  She was admitted 02/11/20 for acute on chronic hypoxic respiratory failure and acute on chronic systolic heart failure w/ volume overload. Hypoxic in the 80s on arrival and required NRB. LEE up to thighs. BNP 617. CXR w/ cardiomegaly, but no overt edema and no infiltrate or effusions. D-dimer elevated at 1.07 but V/Q scan negative for PE. She was started on IV diuretics and diuresed.   Echo this admit shows LVEF 45-50% w/ global hypokinesis. RV severely enlarged w/ moderately elevated pulmonary artery systolic pressure, 56.8 mmHg, + mod-severe TR.   RHC yesterday demonstrated severe PAH w/ normal left-sided pressures. CO/CI normal.  She is currently laying in bed getting breathing treatment. Daughter present at bedside. No resting dyspnea.    RHC Findings 02/15/20:  RA = 20 RV = 72/27 PA = 79/44 (53) PCW = 18 Fick  cardiac output/index = 5.35/2.9 Thermo CO/CI = 4.4/2.4 PVR = 6.5 (Fick) 8.0 (Thermo) Ao sat = 94% PA sat = 64%, 64% SVC sat = 61%  Assessment: 1. Severe PAH with normal left-sided pressures  Plan/Discussion:  There is near equalization or diastolic pressures which can reflect restrictive physiology or elevated R-sided pressure with normal left pressures. Given relatively normal PCWP - restriction felt to be less likely.     Echo 02/12/20 Left ventricular ejection fraction, by estimation, is 45 to 50%. The left ventricle has mildly decreased function. The left ventricle demonstrates global hypokinesis. Left ventricular diastolic function could not be evaluated. There is the interventricular septum is flattened in systole and diastole, consistent with right ventricular pressure and volume overload. 2. Right ventricular systolic function is severely reduced. The right ventricular size is severely enlarged. There is moderately elevated pulmonary artery systolic pressure. The estimated right ventricular systolic pressure is 12.7 mmHg. 3. Right atrial size was severely dilated. 4. The mitral valve is grossly normal. Mild mitral valve regurgitation. No evidence of mitral stenosis. 5. The tricuspid valve is abnormal. 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 6. The aortic valve is tricuspid. Aortic valve regurgitation is not visualized. No aortic stenosis is present. 7. The inferior vena cava is normal in size with greater than 50% respiratory variability, suggesting right atrial pressure of 3 mmHg.   Review of Systems: [y] = yes, _0  = no   . General: Weight gain [Y]; Weight loss _1 ; Anorexia _2 ; Fatigue _3 ; Fever _4 ; Chills _5 ; Weakness _6   .  Cardiac: Chest pain/pressure _0 ; Resting SOB [Y ]; Exertional SOB [Y ]; Orthopnea _1 ; Pedal Edema [ Y]; Palpitations _2 ; Syncope _3 ; Presyncope _4 ; Paroxysmal nocturnal dyspnea_5   . Pulmonary: Cough _6 ; Wheezing[Y ];  Hemoptysis_7 ; Sputum _8 ; Snoring _9   . GI: Vomiting_10 ; Dysphagia_11 ; Melena_12 ; Hematochezia _13 ; Heartburn_14 ; Abdominal pain _15 ; Constipation _16 ; Diarrhea _17 ; BRBPR _18   . GU: Hematuria_19 ; Dysuria _20 ; Nocturia_21   . Vascular: Pain in legs with walking _22 ; Pain in feet with lying flat _23 ; Non-healing sores _24 ; Stroke _25 ; TIA _26 ; Slurred speech _27 ;  . Neuro: Headaches_28 ; Vertigo_29 ; Seizures_30 ; Paresthesias_31 ;Blurred vision _32 ; Diplopia _33 ; Vision changes _34   . Ortho/Skin: Arthritis _35 ; Joint pain _36 ; Muscle pain _37 ; Joint swelling _38 ; Back Pain _39 ; Rash _40   . Psych: Depression_41 ; Anxiety_42   . Heme: Bleeding problems _43 ; Clotting disorders _44 ; Anemia _45   . Endocrine: Diabetes _46 ; Thyroid dysfunction_47   Home Medications Prior to Admission medications   Medication Sig Start Date End Date Taking? Authorizing Provider  amLODipine (NORVASC) 10 MG tablet TAKE 1 TABLET EVERY DAY Patient taking differently: Take 10 mg by mouth daily.  01/16/20  Yes Henson, Vickie L, NP-C  aspirin 81 MG tablet Take 81 mg by mouth at bedtime.    Yes [provider]  atorvastatin (LIPITOR) 40 MG tablet TAKE 1 TABLET EVERY DAY Patient taking differently: Take 40 mg by mouth daily.  01/16/20  Yes Henson, Vickie L, NP-C  carvedilol (COREG) 25 MG tablet TAKE 1 TABLET TWICE DAILY WITH A MEAL Patient taking differently: Take 25 mg by mouth 2 (two) times daily with a meal.  01/24/19  Yes Henson, Vickie L, NP-C  ferrous sulfate 325 (65 FE) MG tablet Take 325 mg by mouth daily with breakfast.    Yes [provider]  furosemide (LASIX) 40 MG tablet TAKE 1 TABLET TWICE DAILY Patient taking differently: Take 40 mg by mouth daily.  01/24/19  Yes Henson, Vickie L, NP-C  MYRBETRIQ 50 MG TB24 tablet TAKE 1 TABLET EVERY DAY Patient taking differently: Take 50 mg by mouth daily.  10/25/19  Yes Henson, Vickie L, NP-C  VITAMIN D, CHOLECALCIFEROL, PO Take 1 tablet by mouth daily.    Yes [provider]  ACCU-CHEK AVIVA PLUS test strip USE  TO TEST BLOOD SUGAR ONE TIME DAILY 02/08/20   Harland Dingwall L, NP-C  Accu-Chek Softclix Lancets lancets USE  AS  INSTRUCTED 06/26/19   Henson, Vickie L, NP-C  Alcohol Swabs (B-D SINGLE USE SWABS REGULAR) PADS USE  TO TEST BLOOD SUGAR ONE TIME DAILY 07/27/19   Henson, Vickie L, NP-C  metolazone (ZAROXOLYN) 2.5 MG tablet Take 1 tablet (2.5 mg total) by mouth daily as needed. 02/11/20 05/11/20  Croitoru, Mihai, MD  OXYGEN Inhale 2 L into the lungs at bedtime.     [provider]  potassium chloride (KLOR-CON) 10 MEQ tablet Take 1 tablet (10 mEq total) by mouth daily as needed. 02/11/20 05/11/20  Croitoru, Dani Gobble, MD    Past Medical History: Past Medical History:  Diagnosis Date  . Anemia   . CAD (coronary artery disease)   . Cardiac defibrillator in situ   . Cataract    bilateral 2017  . CHF (congestive heart failure) ([place])   . Chronic  systolic heart failure ([place])   . CKD (chronic kidney disease) stage 4, GFR 15-29 ml/min (HCC)   . COPD (chronic obstructive pulmonary disease) ([place])   . HTN (hypertension)   . 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 Rollene Rotunda in [place]. Per notes she was due for colonoscopy in 12/2016  . OSA on CPAP 09/08/2017  . Other cardiomyopathies ([place])   . Oxygen deficiency    2 liters at night  . Pulmonary HTN ([place])   . Sleep apnea    uses c-pap  . Stroke ([place])   . Type 2 diabetes, controlled, with peripheral neuropathy ([place])   . Urge incontinence of urine     Past Surgical History: Past Surgical History:  Procedure Laterality Date  . BIOPSY  03/29/2018   Procedure: BIOPSY;  Surgeon: Yetta Flock, MD;  Location: [facility];  Service: Gastroenterology;;  . COLONOSCOPY    . COLONOSCOPY WITH PROPOFOL N/A 03/29/2018   Procedure: COLONOSCOPY WITH PROPOFOL;  Surgeon: Yetta Flock, MD;  Location: [facility];  Service: Gastroenterology;  Laterality: N/A;  .  ESOPHAGOGASTRODUODENOSCOPY (EGD) WITH PROPOFOL N/A 03/29/2018   Procedure: ESOPHAGOGASTRODUODENOSCOPY (EGD) WITH PROPOFOL;  Surgeon: Yetta Flock, MD;  Location: [facility];  Service: Gastroenterology;  Laterality: N/A;  . POLYPECTOMY  03/29/2018   Procedure: POLYPECTOMY;  Surgeon: Yetta Flock, MD;  Location: [facility];  Service: Gastroenterology;;  . RIGHT HEART CATH N/A 02/15/2020   Procedure: RIGHT HEART CATH;  Surgeon: Jolaine Artist, MD;  Location: [facility] [place] [facility];  Service: Cardiovascular;  Laterality: N/A;  . TONSILLECTOMY AND ADENOIDECTOMY     age 50  . TUBAL LIGATION     years ago  . UPPER GASTROINTESTINAL ENDOSCOPY    . UPPER GI ENDOSCOPY  11/28/2009   mild erosive esophagitis, mild erosive changes in stomach, chronic constipation    Family History: Family History  Problem Relation Age of Onset  . Colon polyps Daughter   . Colon cancer Neg Hx   . Esophageal cancer Neg Hx   . Rectal cancer Neg Hx   . Stomach cancer Neg Hx     Social History: Social History   Socioeconomic History  . Marital status: Widowed    Spouse name: Not on file  . Number of children: Not on file  . Years of education: Not on file  . Highest education level: Not on file  Occupational History  . Not on file  Tobacco Use  . Smoking status: Former Smoker    Quit date: 03/14/2009    Years since quitting: 10.9  . Smokeless tobacco: Never Used  . Tobacco comment: quit smoking 2010  Substance and Sexual Activity  . Alcohol use: No  . Drug use: No  . Sexual activity: Not on file  Other Topics Concern  . Not on file  Social History Narrative   Lives alone.  2 daughters and 1 granddaughter in [place]   Social Determinants of Health   Financial Resource Strain:   . Difficulty of Paying Living Expenses:   Food Insecurity:   . Worried About Charity fundraiser in the Last Year:   . Arboriculturist in the Last Year:   Transportation Needs:   . Film/video editor  (Medical):   [name] Lack of Transportation (Non-Medical):   Physical Activity:   . Days of Exercise per Week:   . Minutes of Exercise per Session:   Stress:   . Feeling of Stress :  Social Connections:   . Frequency of Communication with Friends and Family:   . Frequency of Social Gatherings with Friends and Family:   . Attends Religious Services:   . Active Member of Clubs or Organizations:   . Attends Archivist Meetings:   [name] Marital Status:     Allergies:  Allergies  Allergen Reactions  . Benazepril Hcl   . Lotrel [Amlodipine Besy-Benazepril Hcl]   . Talwin [Pentazocine]     Hallulate    Objective:    Vital Signs:   Temp:  [97.5 F (36.4 C)-98.3 F (36.8 C)] 98.3 F (36.8 C) (07/30 1052) Pulse Rate:  [56-61] 59 (07/30 1052) Resp:  [11-20] 16 (07/30 1052) BP: (117-150)/(53-89) 127/61 (07/30 1052) SpO2:  [90 %-96 %] 96 % (07/30 1052) Weight:  [82.4 kg] 82.4 kg (07/30 0600) Last BM Date: 02/14/20  Weight change: Filed Weights   02/14/20 0059 02/15/20 0628 02/16/20 0600  Weight: 80.6 kg 81.4 kg 82.4 kg    Intake/Output:   Intake/Output Summary (Last 24 hours) at 02/16/2020 1106 Last data filed at 02/16/2020 9150 Gross per 24 hour  Intake 255.49 ml  Output 300 ml  [name] -44.51 ml      Physical Exam    General:  Well appearing elderly female, moderately obese. No resp difficulty HEENT: normal Neck: supple. Neck veins distended JVD elevated to jaw . Carotids 2+ bilat; no bruits. No lymphadenopathy or thyromegaly appreciated. Cor: PMI nondisplaced. Regular rate & rhythm. 3/6 TR murmur  Lungs: slight diffuse expiratory wheezing  Abdomen: obese, soft, nontender, nondistended. No hepatosplenomegaly. No bruits or masses. Good bowel sounds. Extremities: no cyanosis, clubbing, rash, edema Neuro: alert & orientedx3, cranial nerves grossly intact. moves all 4 extremities w/o difficulty. Affect pleasant   Telemetry   Apaced- V sensed  EKG    No new EKG to  review   Labs   Basic Metabolic Panel: Recent Labs  Lab 02/11/20 2014 02/11/20 2014 02/13/20 0439 02/13/20 0439 02/14/20 0702 02/15/20 0428 02/15/20 1713 02/15/20 1717 02/15/20 1818 02/16/20 0110  NA 141   < > 141   < > 139 136 151*  142 143  --  139  K 3.6   < > 3.5   < > 4.1 4.2 3.1*  4.1 3.9  --  4.2  CL 103  --  104  --  101 99  --   --   --  102  CO2 30  --  27  --  26 28  --   --   --  28  GLUCOSE 132*  --  203*  --  210* 310*  --   --   --  155*  BUN 22  --  26*  --  37* 48*  --   --   --  49*  CREATININE 1.80*   < > 1.80*  --  1.98* 2.17*  --   --  2.04* 1.88*  CALCIUM 9.1   < > 9.1   < > 8.9 8.5*  --   --   --  8.3*  MG 2.0  --   --   --   --   --   --   --   --   --    < > = values in this interval not displayed.    Liver Function Tests: No results for input(s): AST, ALT, ALKPHOS, BILITOT, PROT, ALBUMIN in the last 168 hours. No results for input(s): LIPASE, AMYLASE in the last 168  hours. No results for input(s): AMMONIA in the last 168 hours.  CBC: Recent Labs  Lab 02/11/20 2014 02/15/20 1713 02/15/20 1717 02/15/20 1818  WBC 4.0  --   --  6.5  HGB 11.1* 10.2*  12.6 12.2 11.3*  HCT 37.1 30.0*  37.0 36.0 36.9  MCV 90.0  --   --  88.1  PLT 205  --   --  190    Cardiac Enzymes: No results for input(s): CKTOTAL, CKMB, CKMBINDEX, TROPONINI in the last 168 hours.  BNP: BNP (last 3 results) Recent Labs    02/11/20 2014  BNP 617.2*    ProBNP (last 3 results) No results for input(s): PROBNP in the last 8760 hours.   CBG: Recent Labs  Lab 02/15/20 1125 02/15/20 1630 02/15/20 1821 02/15/20 2108 02/16/20 0553  GLUCAP 264* 148* 130* 220* 146*    Coagulation Studies: No results for input(s): LABPROT, INR in the last 72 hours.   Imaging   CARDIAC CATHETERIZATION  Result Date: 02/15/2020 Findings: RA = 20 RV = 72/27 PA = 79/44 (53) PCW = 18 Fick cardiac output/index = 5.35/2.9 Thermo CO/CI = 4.4/2.4 PVR = 6.5 (Fick) 8.0 (Thermo) Ao sat =  94% PA sat = 64%, 64% SVC sat = 61% Assessment: 1. Severe PAH with normal left-sided pressures Plan/Discussion: There is near equalization or diastolic pressures which can reflect restrictive physiology or elevated R-sided pressure with normal left pressures. Given relatively normal PCWP - restriction felt to be less likely. Erin Bickers, MD 5:36 PM  CT Chest High Resolution  Result Date: 02/15/2020 CLINICAL DATA:  Interstitial lung disease, pulmonary hypertension EXAM: CT CHEST WITHOUT CONTRAST TECHNIQUE: Multidetector CT imaging of the chest was performed following the standard protocol without intravenous contrast. High resolution imaging of the lungs, as well as inspiratory and expiratory imaging, was performed. COMPARISON:  None. FINDINGS: Cardiovascular: Aortic atherosclerosis. Cardiomegaly. Scattered Three-vessel coronary artery calcifications. Left chest multi lead pacer. Enlargement of the main pulmonary artery up to 3.6 cm. No pericardial effusion. Mediastinum/Nodes: No enlarged mediastinal, hilar, or axillary lymph nodes. Thyroid gland, trachea, and esophagus demonstrate no significant findings. Lungs/Pleura: Small bilateral pleural effusions and associated atelectasis or consolidation. No evidence of fibrotic interstitial lung disease. No significant air trapping on expiratory phase imaging. Upper Abdomen: No acute abnormality. Musculoskeletal: No chest wall mass or suspicious bone lesions identified. Anasarca. Disc degenerative disease and ankylosis of the thoracic spine IMPRESSION: 1. No evidence of fibrotic interstitial lung disease, however examination of the lung bases is limited by the presence of pleural effusions. Follow-up examination could be used to assess for mild bibasilar fibrotic interstitial lung disease at the resolution of pleural effusions, if warranted by ongoing suspicion for fibrotic interstitial lung disease. 2. Small bilateral pleural effusions and associated atelectasis  or consolidation. 3. Cardiomegaly and coronary artery disease. 4. Enlargement of the main pulmonary artery, as can be seen in pulmonary hypertension. 5. Anasarca. 6. Aortic Atherosclerosis (ICD10-I70.0). Electronically Signed   By: Eddie Candle M.D.   On: 02/15/2020 20:28      Medications:     Current Medications: . amLODipine  10 mg Oral Daily  . aspirin  81 mg Oral QHS  . atorvastatin  40 mg Oral Daily  . budesonide (PULMICORT) nebulizer solution  0.5 mg Nebulization BID  . carvedilol  25 mg Oral BID WC  . enoxaparin (LOVENOX) injection  30 mg Subcutaneous Q24H  . ferrous sulfate  325 mg Oral Q breakfast  . hydrALAZINE  25 mg Oral Q8H  .  insulin aspart  0-20 Units Subcutaneous TID WC  . insulin aspart  0-5 Units Subcutaneous QHS  . ipratropium-albuterol  3 mL Nebulization BID  . mirabegron ER  50 mg Oral Daily  . potassium chloride  20 mEq Oral Daily  . sildenafil  20 mg Oral TID  . sodium chloride flush  3 mL Intravenous Q12H  . torsemide  40 mg Oral Daily   And  . torsemide  20 mg Oral q1800     Infusions: . sodium chloride        Assessment/Plan   1. PAH - RHC yesterday demonstrated severe PAH w/ normal left-sided pressures. PA = 79/44 (53). CO/CI normal. 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  - Will need home O2 (goal SpO2 90 to 95%) - Continue CPAP qhs - Start 20 mg of Sildenafil tid  2. A/c Systolic Heart Failure w/ Predominant RV Failure - long standing history of chronic systolic heart failure/ NICM and pulmonary hypertension. Per records, her systolic HF dates back to 2006. Mild nonobstructive CAD by LHC in 2006 and again in 2015.  - s/p [employer] ICD  - Echo this admit, EF 45-50%. RV severely enlarged w/ moderately reduced systolic function 2/2 PH - She has diuresed w/ IV Lasix but continues w/ mild fluid overload - Stop PO Lasix and change to torsemide 40 mg qam/ 20 mg qpm   3. A/c hypoxic/hypercapnic  respiratory failure - multifactorial, 2/2 PAH, a/c CHF and restrictive lung disease  - supp O2, SpO2 goal >90% - continue pulmicort, duoneb - CPAP qhs - adding Sildenafil tid (will need to monitor for worsening hypoxia)  - followed by Dr. Halford Chessman as outpatient   4. Stage III CKD - baseline SCr ~1.6 - peaked to 2.2 this admit - trending down, 1.88 today  - monitor w/ diuresis   5. Systemic Hypertension  - controlled on current regimen - monitor w/ Sildenafil addition   6. T2DM - well controlled, Hgb A1c 6.5  - SSI   7. OSA - continue CPAP qhs    Length of Stay: [address], PA-C  02/16/2020, 11:06 AM  Advanced Heart Failure Team Pager [phone] (M-F; 7a - 4p)  Please contact [facility] for night-coverage after hours (4p -7a ) and weekends on [email or web address]  Patient seen and examined with the above-signed Advanced Practice Provider and/or Housestaff. I personally reviewed laboratory data, imaging studies and relevant notes. I independently examined the patient and formulated the important aspects of the plan. I have edited the note to reflect any of my changes or salient points. I have personally discussed the plan with the patient and/or family.  79 y/o woman with morbid obesity, DM2, CKD3, OSA on CPAP, systolic HF EF 45% and restrictive lung disease. Admitted with respiratory failure with high O2 requirement,   O2 requirement persisted after diuresis. Echo showed severe PAH. RHC yesterday confirmed severe PAH with normal PCWP (after diuresis),. Hires CT negative for ILD. VQ negative for CTEPH.  General:  Elderly obese woman lying in bed No resp difficulty HEENT: normal Neck: supple. JVP to jaw  Carotids 2+ bilat; no bruits. No lymphadenopathy or thryomegaly appreciated. Cor: PMI nondisplaced. Regular rate & rhythm. 2/6 TR increased P2 Lungs: mild EE wheeze Abdomen: obese soft, nontender, nondistended. No hepatosplenomegaly. No bruits or masses. Good bowel  sounds. Extremities: no cyanosis, clubbing, rash, 1+ edema Neuro: alert & orientedx3, cranial nerves grossly intact. moves all 4 extremities w/o  difficulty. Affect pleasant  She has severe PAH likely due primarily to [facility] Group 3 disease but likely also has component of WHO Group I & II.   Will need to ensure adequate oxygenation at night and with exertion. Will switch lasix to torsemide 40/20 to keep fluid off better. Needs weight loss. Now that she has been diuresed, will attempt careful trial of sildenafil 20 tiid and watch closely for shunting. Will arrange f/u in the [facility].   We will sign off.   Erin Bickers, MD  9:08 PM

## 2020-02-16 NOTE — Care Management Important Message (Signed)
Important Message  Patient Details  Name: [name] MRN: [id number] Date of Birth: [birth year]   Medicare Important Message Given:  Yes     Shelda Altes 02/16/2020, 8:47 AM

## 2020-02-16 NOTE — Progress Notes (Signed)
Progress Note  Patient Name: [name] Date of Encounter: 02/16/2020  [facility] Cardiologist: Sanda Klein, MD   Subjective   No complaints, but remains on very high flow O2. Reviewed the results of RHC (severe PAH with near normal left heart pressures) and hi-res CT (no evidence of severe scarring/interstitial lung disease) with patient and her daughter, [name]. Nitrates stopped and sildenafil started today.  Inpatient Medications    Scheduled Meds:  amLODipine  10 mg Oral Daily   aspirin  81 mg Oral QHS   atorvastatin  40 mg Oral Daily   budesonide (PULMICORT) nebulizer solution  0.5 mg Nebulization BID   carvedilol  25 mg Oral BID WC   enoxaparin (LOVENOX) injection  30 mg Subcutaneous Q24H   ferrous sulfate  325 mg Oral Q breakfast   hydrALAZINE  25 mg Oral Q8H   insulin aspart  0-20 Units Subcutaneous TID WC   insulin aspart  0-5 Units Subcutaneous QHS   ipratropium-albuterol  3 mL Nebulization BID   mirabegron ER  50 mg Oral Daily   potassium chloride  20 mEq Oral Daily   sildenafil  20 mg Oral TID   sodium chloride flush  3 mL Intravenous Q12H   torsemide  40 mg Oral Daily   And   torsemide  20 mg Oral q1800   Continuous Infusions:  sodium chloride     PRN Meds: sodium chloride, acetaminophen, ondansetron (ZOFRAN) IV, polyethylene glycol, sodium chloride flush   Vital Signs    Vitals:   02/15/20 2324 02/16/20 0400 02/16/20 0600 02/16/20 0906  BP: (!) 124/61 126/75    Pulse: 60 60    Resp: 16 17    Temp:  97.7 F (36.5 C)    TempSrc:  Oral    SpO2: 95% 96%  92%  Weight:   82.4 kg   Height:        Intake/Output Summary (Last 24 hours) at 02/16/2020 1017 Last data filed at 02/16/2020 0611 Gross per 24 hour  Intake 255.49 ml  Output 300 ml  Net -44.51 ml   Last 3 Weights 02/16/2020 02/15/2020 02/14/2020  Weight (lbs) 181 lb 11.2 oz 179 lb 8 oz 177 lb 9.6 oz  Weight (kg) 82.419 kg 81.421 kg 80.559 kg      Telemetry      A paced V sensed - Personally Reviewed  ECG    No new tracing - Personally Reviewed  Physical Exam  Smiling GEN: No acute distress.   Neck:  JVD to angle of jaw Cardiac: RRR, no diastolic murmurs, rubs, or gallops. 3/6holosystolic LLSB murmur Respiratory: Clear to auscultation bilaterally. GI: Soft, nontender, non-distended  MS: No edema; No deformity. Neuro:  Nonfocal  Psych: Normal affect   Labs    High Sensitivity Troponin:   Recent Labs  Lab 02/11/20 2014 02/11/20 2247  TROPONINIHS 17 18*      Chemistry/ Recent Labs  Lab 02/14/20 0702 02/14/20 0702 02/15/20 0428 02/15/20 0428 02/15/20 1713 02/15/20 1717 02/15/20 1818 02/16/20 0110  NA 139   < > 136   < > 151*   142 143  --  139  K 4.1   < > 4.2   < > 3.1*   4.1 3.9  --  4.2  CL 101  --  99  --   --   --   --  102  CO2 26  --  28  --   --   --   --  28  GLUCOSE 210*  --  310*  --   --   --   --  155*  BUN 37*  --  48*  --   --   --   --  49*  CREATININE 1.98*   < > 2.17*  --   --   --  2.04* 1.88*  CALCIUM 8.9  --  8.5*  --   --   --   --  8.3*  GFRNONAA 23*   < > 21*  --   --   --  22* 25*  GFRAA 27*   < > 24*  --   --   --  26* 29*  ANIONGAP 12  --  9  --   --   --   --  9   < > = values in this interval not displayed.     Hematology Recent Labs  Lab 02/11/20 2014 02/11/20 2014 02/15/20 1713 02/15/20 1717 02/15/20 1818  WBC 4.0  --   --   --  6.5  RBC 4.12  --   --   --  4.19  HGB 11.1*   < > 10.2*   12.6 12.2 11.3*  HCT 37.1   < > 30.0*   37.0 36.0 36.9  MCV 90.0  --   --   --  88.1  MCH 26.9  --   --   --  27.0  MCHC 29.9*  --   --   --  30.6  RDW 15.5  --   --   --  15.2  PLT 205  --   --   --  190   < > = values in this interval not displayed.    BNP Recent Labs  Lab 02/11/20 2014  BNP 617.2*     DDimer  Recent Labs  Lab 02/12/20 0220  DDIMER 1.07*     Radiology    DG Chest 2 View  Result Date: 02/14/2020 CLINICAL DATA:  79 year old female with history of shortness  of breath and hypoxia. EXAM: CHEST - 2 VIEW COMPARISON:  Chest x-ray 02/11/2020. FINDINGS: Lung volumes are normal. No consolidative airspace disease. No pleural effusions. No evidence of pulmonary edema. Heart size is moderately enlarged. Upper mediastinal contours are within normal limits. Aortic atherosclerosis. Left-sided pacemaker/AICD with lead tips projecting over the expected location of the right atrium and right ventricle. IMPRESSION: 1. No radiographic evidence of acute cardiopulmonary disease. 2. Moderate cardiomegaly. 3. Aortic atherosclerosis. Electronically Signed   By: Vinnie Langton M.D.   On: 02/14/2020 16:06   CARDIAC CATHETERIZATION  Result Date: 02/15/2020 Findings: RA = 20 RV = 72/27 PA = 79/44 (53) PCW = 18 Fick cardiac output/index = 5.35/2.9 Thermo CO/CI = 4.4/2.4 PVR = 6.5 (Fick) 8.0 (Thermo) Ao sat = 94% PA sat = 64%, 64% SVC sat = 61% Assessment: 1. Severe PAH with normal left-sided pressures Plan/Discussion: There is near equalization or diastolic pressures which can reflect restrictive physiology or elevated R-sided pressure with normal left pressures. Given relatively normal PCWP - restriction felt to be less likely. Glori Bickers, MD 5:36 PM  CT Chest High Resolution  Result Date: 02/15/2020 CLINICAL DATA:  Interstitial lung disease, pulmonary hypertension EXAM: CT CHEST WITHOUT CONTRAST TECHNIQUE: Multidetector CT imaging of the chest was performed following the standard protocol without intravenous contrast. High resolution imaging of the lungs, as well as inspiratory and expiratory imaging, was performed. COMPARISON:  None. FINDINGS: Cardiovascular: Aortic atherosclerosis. Cardiomegaly. Scattered Three-vessel coronary  artery calcifications. Left chest multi lead pacer. Enlargement of the main pulmonary artery up to 3.6 cm. No pericardial effusion. Mediastinum/Nodes: No enlarged mediastinal, hilar, or axillary lymph nodes. Thyroid gland, trachea, and esophagus  demonstrate no significant findings. Lungs/Pleura: Small bilateral pleural effusions and associated atelectasis or consolidation. No evidence of fibrotic interstitial lung disease. No significant air trapping on expiratory phase imaging. Upper Abdomen: No acute abnormality. Musculoskeletal: No chest wall mass or suspicious bone lesions identified. Anasarca. Disc degenerative disease and ankylosis of the thoracic spine IMPRESSION: 1. No evidence of fibrotic interstitial lung disease, however examination of the lung bases is limited by the presence of pleural effusions. Follow-up examination could be used to assess for mild bibasilar fibrotic interstitial lung disease at the resolution of pleural effusions, if warranted by ongoing suspicion for fibrotic interstitial lung disease. 2. Small bilateral pleural effusions and associated atelectasis or consolidation. 3. Cardiomegaly and coronary artery disease. 4. Enlargement of the main pulmonary artery, as can be seen in pulmonary hypertension. 5. Anasarca. 6. Aortic Atherosclerosis (ICD10-I70.0). Electronically Signed   By: Eddie Candle M.D.   On: 02/15/2020 20:28    Cardiac Studies  Echo 02/12/2020  1. Left ventricular ejection fraction, by estimation, is 45 to 50%. The  left ventricle has mildly decreased function. The left ventricle  demonstrates global hypokinesis. Left ventricular diastolic function could  not be evaluated. There is the  interventricular septum is flattened in systole and diastole, consistent  with right ventricular pressure and volume overload.  2. Right ventricular systolic function is severely reduced. The right  ventricular size is severely enlarged. There is moderately elevated  pulmonary artery systolic pressure. The estimated right ventricular  systolic pressure is 13.2 mmHg.  3. Right atrial size was severely dilated.  4. The mitral valve is grossly normal. Mild mitral valve regurgitation.  No evidence of mitral stenosis.   5. The tricuspid valve is abnormal. Tricuspid valve regurgitation is  moderate to severe.  6. The aortic valve is tricuspid. Aortic valve regurgitation is not  visualized. No aortic stenosis is present.  7. The inferior vena cava is normal in size with greater than 50%  respiratory variability, suggesting right atrial pressure of 3 mmHg.   RHC 02/15/2020  RA = 20 RV = 72/27 PA = 79/44 (53) PCW = 18 Fick cardiac output/index = 5.35/2.9 Thermo CO/CI = 4.4/2.4 PVR = 6.5 (Fick) 8.0 (Thermo) Ao sat = 94% PA sat = 64%, 64% SVC sat = 61%  Assessment: 1. Severe PAH with normal left-sided pressures  Patient Profile     79 y.o. female with a history of nonischemic cardiomyopathy (EF 45% echo 2016 and 2021), minor nonobstructive CAD (cath 2016), severe pulmonary hypertension, obstructive sleep apnea and restrictive lung disease, essential hypertension, hyperlipidemia and type 2 diabetes mellitus presenting with acute exacerbation of combined systolic and diastolic heart failure with both right and left heart failure features. After diuresis, still has severe PAH and hypoxia.  Assessment & Plan    1.CHF: Weight (approx. 180 lb/82 kg) still substantially higher than her previous estimation of her "dry weight", but RHC yesterday showed nearly normal PAWP at 18. Creatinine better after 1 day of diuretics, back to baseline.Unchanged LV systolic function. The echo continues to show prominent changes of chronic cor pulmonale.The cardiac output is likely limited by RV dysfunction. 2.PAH:Presumed secondary to chronic restrictive lung disease and obstructive sleep apnea,since it could not be entirely explained by elevation of left heart pressures for cardiac catheterization 2016.Hi-res CT without prominent  changes. May have some component of "WHO group 1" type PAH. Sildenafil started. Watch for worsening VQ mismatch and increased O2 requirements. 3.Acute on chronic cor  pulmonale:Mildly elevated D-dimer, but the perfusion scan was negative for pulmonary embolism. Clear chest x-ray. Gradual onset of symptoms and weight gain appears more typical for(right?)heart failure exacerbation.  4.Acute on chronic respiratory insufficiency: previously only on home O2 at night. Previous PFTs (2018) showed no obstruction but fairly significant restriction (65% of predicted) and markedly reduced DLCO (39%, corrected for alveolar volume).She reports compliance with CPAP, but her machine has been broken for the last roughly 1 month. This may have contributed to the heart failure exacerbation. Note that she is not polycythemic. Incentive spirometry started.  5.Paroxysmal atrial tachycardia:No arrhythmia since admission. None detected by her defibrillator since last office visit in May. She has had one episode ofatrial tachycardia/slow atrial flutter at about 200 bpm with 3: 1 AV conduction lasting for about an hour, in May 2021. True atrial fibrillation has never been documented. 6. HTN:On carvedilol 25 mg twice daily and maximum dose amlodipine. Avoiding RAAS inhibitors due to her kidney problems. Hydralazine added. Stopped Nitropaste after adding sildenafil.  7. IPP:GFQMK nonobstructive disease by previous heart catheterization. She does not have angina pectoris and her troponin levels are essentially normal. On statin and aspirin and beta-blocker. Note exceptional HDL cholesterol over 100 and LDL in the 30s. 8. Acute onCKD 3b:Creatinineincreased to2.17, higher thanher baseline, previously documented at 1.6-1.9. Diuretics stopped. 9. DM: Diet controlledcurrently, recent hemoglobin A1c 6.0% without medications.  10. JIZ:XYOFVW device function ([employer] Sprint Fidelis 6949-lead under advisory with normal lead impedance and sensing).  Appreciate Advanced HF service assistance.     For questions or updates, please contact [place] Please  consult [email or web address] for contact info under        Signed, Sanda Klein, MD  02/16/2020, 10:17 AM

## 2020-02-17 LAB — BASIC METABOLIC PANEL
Anion gap: 8 (ref 5–15)
BUN: 48 mg/dL — ABNORMAL HIGH (ref 8–23)
CO2: 29 mmol/L (ref 22–32)
Calcium: 8.5 mg/dL — ABNORMAL LOW (ref 8.9–10.3)
Chloride: 101 mmol/L (ref 98–111)
Creatinine, Ser: 1.76 mg/dL — ABNORMAL HIGH (ref 0.44–1.00)
GFR calc Af Amer: 31 mL/min — ABNORMAL LOW (ref >60)
GFR calc non Af Amer: 27 mL/min — ABNORMAL LOW (ref >60)
Glucose, Bld: 114 mg/dL — ABNORMAL HIGH (ref 70–99)
Potassium: 4.4 mmol/L (ref 3.5–5.1)
Sodium: 138 mmol/L (ref 135–145)

## 2020-02-17 LAB — GLUCOSE, CAPILLARY
Glucose-Capillary: 113 mg/dL — ABNORMAL HIGH (ref 70–99)
Glucose-Capillary: 178 mg/dL — ABNORMAL HIGH (ref 70–99)
Glucose-Capillary: 128 mg/dL — ABNORMAL HIGH (ref 70–99)
Glucose-Capillary: 203 mg/dL — ABNORMAL HIGH (ref 70–99)

## 2020-02-17 NOTE — Progress Notes (Signed)
Progress Note  Patient Name: NAJEE COWENS Date of Encounter: 02/17/2020  [facility] Cardiologist: Sanda Klein, MD   Subjective   Slight improvement in SOB  Inpatient Medications    Scheduled Meds: . amLODipine  10 mg Oral Daily  . aspirin  81 mg Oral QHS  . atorvastatin  40 mg Oral Daily  . budesonide (PULMICORT) nebulizer solution  0.5 mg Nebulization BID  . carvedilol  25 mg Oral BID WC  . enoxaparin (LOVENOX) injection  30 mg Subcutaneous Q24H  . ferrous sulfate  325 mg Oral Q breakfast  . hydrALAZINE  25 mg Oral Q8H  . insulin aspart  0-20 Units Subcutaneous TID WC  . insulin aspart  0-5 Units Subcutaneous QHS  . ipratropium-albuterol  3 mL Nebulization BID  . mirabegron ER  50 mg Oral Daily  . potassium chloride  20 mEq Oral Daily  . sildenafil  20 mg Oral TID  . sodium chloride flush  3 mL Intravenous Q12H  . torsemide  40 mg Oral Daily   And  . torsemide  20 mg Oral q1800   Continuous Infusions: . sodium chloride     PRN Meds: sodium chloride, acetaminophen, ondansetron (ZOFRAN) IV, polyethylene glycol, sodium chloride flush   Vital Signs    Vitals:   02/16/20 1931 02/16/20 2000 02/17/20 0256 02/17/20 0305  BP: (!) 105/59   (!) 118/58  Pulse: 60   59  Resp: 16   21  Temp: 97.9 F (36.6 C)   98.9 F (37.2 C)  TempSrc: Oral   Oral  SpO2: 95% 94%  96%  Weight:   79.1 kg   Height:        Intake/Output Summary (Last 24 hours) at 02/17/2020 0756 Last data filed at 02/17/2020 4383 Gross per 24 hour  Intake 720 ml  Output 3150 ml  Net -2430 ml   Last 3 Weights 02/17/2020 02/16/2020 02/15/2020  Weight (lbs) 174 lb 6.4 oz 181 lb 11.2 oz 179 lb 8 oz  Weight (kg) 79.107 kg 82.419 kg 81.421 kg      Telemetry    SR- Personally Reviewed  ECG    n/a - Personally Reviewed  Physical Exam   GEN: No acute distress.   Neck: No JVD Cardiac: RRR, no murmurs, rubs, or gallops.  Respiratory: crackles bilateral bases GI: Soft, nontender,  non-distended  MS: No edema; No deformity. Neuro:  Nonfocal  Psych: Normal affect   Labs    High Sensitivity Troponin:   Recent Labs  Lab 02/11/20 2014 02/11/20 2247  TROPONINIHS 17 18*      Chemistry Recent Labs  Lab 02/15/20 0428 02/15/20 0428 02/15/20 1713 02/15/20 1717 02/15/20 1818 02/16/20 0110 02/17/20 0358  NA 136  --    < > 143  --  139 138  K 4.2  --    < > 3.9  --  4.2 4.4  CL 99  --   --   --   --  102 101  CO2 28  --   --   --   --  28 29  GLUCOSE 310*  --   --   --   --  155* 114*  BUN 48*  --   --   --   --  49* 48*  CREATININE 2.17*   < >  --   --  2.04* 1.88* 1.76*  CALCIUM 8.5*  --   --   --   --  8.3* 8.5*  GFRNONAA 21*   < >  --   --  22* 25* 27*  GFRAA 24*   < >  --   --  26* 29* 31*  ANIONGAP 9  --   --   --   --  9 8   < > = values in this interval not displayed.     Hematology Recent Labs  Lab 02/11/20 2014 02/11/20 2014 02/15/20 1713 02/15/20 1717 02/15/20 1818  WBC 4.0  --   --   --  6.5  RBC 4.12  --   --   --  4.19  HGB 11.1*   < > 10.2*  12.6 12.2 11.3*  HCT 37.1   < > 30.0*  37.0 36.0 36.9  MCV 90.0  --   --   --  88.1  MCH 26.9  --   --   --  27.0  MCHC 29.9*  --   --   --  30.6  RDW 15.5  --   --   --  15.2  PLT 205  --   --   --  190   < > = values in this interval not displayed.    BNP Recent Labs  Lab 02/11/20 2014  BNP 617.2*     DDimer  Recent Labs  Lab 02/12/20 0220  DDIMER 1.07*     Radiology    CARDIAC CATHETERIZATION  Result Date: 02/15/2020 Findings: RA = 20 RV = 72/27 PA = 79/44 (53) PCW = 18 Fick cardiac output/index = 5.35/2.9 Thermo CO/CI = 4.4/2.4 PVR = 6.5 (Fick) 8.0 (Thermo) Ao sat = 94% PA sat = 64%, 64% SVC sat = 61% Assessment: 1. Severe PAH with normal left-sided pressures Plan/Discussion: There is near equalization or diastolic pressures which can reflect restrictive physiology or elevated R-sided pressure with normal left pressures. Given relatively normal PCWP - restriction felt to  be less likely. Glori Bickers, MD 5:36 PM  CT Chest High Resolution  Result Date: 02/15/2020 CLINICAL DATA:  Interstitial lung disease, pulmonary hypertension EXAM: CT CHEST WITHOUT CONTRAST TECHNIQUE: Multidetector CT imaging of the chest was performed following the standard protocol without intravenous contrast. High resolution imaging of the lungs, as well as inspiratory and expiratory imaging, was performed. COMPARISON:  None. FINDINGS: Cardiovascular: Aortic atherosclerosis. Cardiomegaly. Scattered Three-vessel coronary artery calcifications. Left chest multi lead pacer. Enlargement of the main pulmonary artery up to 3.6 cm. No pericardial effusion. Mediastinum/Nodes: No enlarged mediastinal, hilar, or axillary lymph nodes. Thyroid gland, trachea, and esophagus demonstrate no significant findings. Lungs/Pleura: Small bilateral pleural effusions and associated atelectasis or consolidation. No evidence of fibrotic interstitial lung disease. No significant air trapping on expiratory phase imaging. Upper Abdomen: No acute abnormality. Musculoskeletal: No chest wall mass or suspicious bone lesions identified. Anasarca. Disc degenerative disease and ankylosis of the thoracic spine IMPRESSION: 1. No evidence of fibrotic interstitial lung disease, however examination of the lung bases is limited by the presence of pleural effusions. Follow-up examination could be used to assess for mild bibasilar fibrotic interstitial lung disease at the resolution of pleural effusions, if warranted by ongoing suspicion for fibrotic interstitial lung disease. 2. Small bilateral pleural effusions and associated atelectasis or consolidation. 3. Cardiomegaly and coronary artery disease. 4. Enlargement of the main pulmonary artery, as can be seen in pulmonary hypertension. 5. Anasarca. 6. Aortic Atherosclerosis (ICD10-I70.0). Electronically Signed   By: Eddie Candle M.D.   On: 02/15/2020 20:28    Cardiac Studies     Patient  Profile     79 y.o. female with a history of nonischemic cardiomyopathy (EF 45% echo 2016and 2021),minor nonobstructive CAD (cath 2016), severe pulmonary hypertension, obstructive sleep apnea and restrictive lung disease, essential hypertension, hyperlipidemia and type 2 diabetes mellitus presenting with acute exacerbation of combined systolic and diastolic heart failure with both right and left heart failure features. After diuresis, still has severe PAH and hypoxia.  Assessment & Plan    1. . PAH with cor pulmonale - 01/2020 cath mean PA 53, PCWP 18, CI 2.4 - 01/2020 echo LVEF 45-50%, indet DDx, severe RV dysfunction, PASP 52, mod to severe TR - Presumed secondary to chronic restrictive lung disease and obstructive sleep apnea, WHO group 3.   - VQ negative for PE - high rest CT no fibrosis - Previous PFTs(2018)showed no obstruction but fairly significant restriction (65% of predicted)and markedly reduced DLCO(39%, corrected for alveolar volume) - started on sildenafil, monitor for any wosrening hypoxia  - on 10 L HFNC, remains stable since starting sildenafil  2. Acute on chronic systolic HF complicated by RV failure/cor pulmonale  01/2020 echo LVEF 45-50%, indet DDx, severe RV dysfunction, PASP 52, mod to severe TR - Mild nonobstructive CAD by LHC in 2006 and again in 2015.   - changed to oral torsemide yesterday 95m in am 222min pm - neg 2 L yesterday, neg 3.3 L since admission. Downtrend in Cr with diuresis consistent with venous congestion and CHF, continue oral torsemide  3. Paroxysmal atach - no inpatient issues   4. CKD 3 - continue to follow Cr, downtrend from yesterday with change to torsemide.   5. Chronic hypoxic resp failure   For questions or updates, please contact [place] consult [email or web address] for contact info under        Signed, BrCarlyle DollyMD  02/17/2020, 7:56 AM

## 2020-02-17 NOTE — Progress Notes (Signed)
PCCM Progress Note.  Subjective:  Breathing a bit better, weaned to 8 L/min Good volume removal, neg 4.4 L total  BP (!) 101/53 (BP Location: Right Arm)   Pulse 59   Temp 98.8 F (37.1 C) (Oral)   Resp 18   Ht _0  (1.575 m)   Wt 79.1 kg   SpO2 100%   BMI 31.90 kg/m   Comfortable.  Sitting up to chair and eating.  Decreased bilateral basilar breath sounds with some inspiratory crackles.  Only trace pretibial edema  CMP Latest Ref Rng & Units 02/17/2020 02/16/2020 02/15/2020  Glucose 70 - 99 mg/dL 114(H) 155(H) -  BUN 8 - 23 mg/dL 48(H) 49(H) -  Creatinine 0.44 - 1.00 mg/dL 1.76(H) 1.88(H) 2.04(H)  Sodium 135 - 145 mmol/L 138 139 -  Potassium 3.5 - 5.1 mmol/L 4.4 4.2 -  Chloride 98 - 111 mmol/L 101 102 -  CO2 22 - 32 mmol/L 29 28 -  Calcium 8.9 - 10.3 mg/dL 8.5(L) 8.3(L) -  Total Protein 6.0 - 8.5 g/dL - - -  Total Bilirubin 0.0 - 1.2 mg/dL - - -  Alkaline Phos 39 - 117 IU/L - - -  AST 0 - 40 IU/L - - -  ALT 0 - 32 IU/L - - -    CBC Latest Ref Rng & Units 02/15/2020 02/15/2020 02/15/2020  WBC 4.0 - 10.5 K/uL 6.5 - -  Hemoglobin 12.0 - 15.0 g/dL 11.3(L) 12.2 10.2(L)  Hematocrit 36 - 46 % 36.9 36.0 30.0(L)  Platelets 150 - 400 K/uL 190 - -    A/p:  Acute on chronic hypoxic/hypercapnic respiratory failure. - combination of acute on chronic combined CHF, sleep disordered breathing, COPD, WHO group 2 and 3 pulmonary hypertension, and acute on chronic cor pulmonale. This decompensation may relate to the fact that her CPAP broke and she has been off for 3 months.  -continue to diurese as she can tolerate, on torsemide -wean fiO2 as able -mandatory CPAP qhs -pulmicort/ duonebs -now on sildenifil 11m tid, appears to be tolerating -follow BMP and renal fxn (improving)   We will check back on Monday  RBaltazar Apo MD, PhD 02/17/2020, 4:57 PM [facility] [phone] if no answer [phone]

## 2020-02-17 NOTE — Progress Notes (Signed)
PROGRESS NOTE    [name]  UYQ:[id number] DOB: 1940-10-11 DOA: 02/11/2020 PCP: Girtha Rm, NP-C   Brief Narrative:  Patient is 79 year old female with history of chronic combined systolic/diastolic CHF, CKD stage IV, hypertension, OSA on CPAP, diabetes type 2 who was brought to the emergency room with complaints of shortness of breath, bilateral lower extremity swelling, weight gain.  Patient was found to have acute on chronic CHF exacerbation .  Cardiology consulted and following.  Underwent  right heart cath on 02/14/30.  Continues to require 9 to 10 L of oxygen per minute.  Assessment & Plan:   Principal Problem:   Acute respiratory failure with hypoxia (HCC) Active Problems:   Essential hypertension   COPD (chronic obstructive pulmonary disease) (HCC)   OSA on CPAP   CKD (chronic kidney disease) stage 4, GFR 15-29 ml/min (HCC)   Coronary artery disease involving native coronary artery of native heart without angina pectoris   Acute on chronic combined systolic and diastolic CHF (congestive heart failure) (HCC)   CHF exacerbation (HCC)   Acute on chronic hypoxic respiratory failure: Multifactorial.  Underlying CHF, severe pulmonary hypertension,COPD and also possible restrictive lung disease .  This morning she was on nasal cannula at 10L/min.  Continue to monitor respiratory status.  She is on oxygen at night at home at 2L/min.  High-resolution CT scan did not show any fibrotic lung disease  Acute on chronic systolic congestive heart failure: Presented with shortness of breath, lower extremity swelling. echocardiogram done here showed ejection fraction of 45 to 50%, global hypokinesis, severely reduced right ventricular systolic function, elevated pulmonary artery systolic pressure.  Findings consistent with cor pulmonale.  Cardiology following.    Continue to monitor weight, input/output.Continue torsemide.  Underwent right heart catheterization which showed severe pulmonary  hypertension .patient also has ICD for complete heart block  Pulmonary artery hypertension:Likely secondary to  chronic restrictive lung disease, obstructive sleep apnea.  Right heart catheterization done which showed severe pulmonary artery hypertension.  Started on nitroglycerin patch, sildenafil  Chronic respiratory insufficiency/restrictive lung disease: PFTs had shown significant restrictive lung disease with marked reduced DLCO.  Also has history of OSA on CPAP.  She says she is compliant with that.  Follows with pulmonology Dr. Halford Chessman. VQ scan done here did not show any pulmonary embolism.  Pulmonology following.  Paroxysmal atrial tachycardia: Cardiology following.  Continue current management  Hypertension: Monitor blood pressure.  Continue current regimen.  Coronary artery disease: Previous cardiac cath showed minor nonobstructive disease.  On statin, aspirin, beta-blocker.  CKD stage IIIb: Baseline creatinine ranges from 1.6-1.9.  We will continue to monitor.    Diabetes type 2: Recent hemoglobin A1c of 6.  Currently diet-controlled only.  Currently hyperglycemic most likely secondary to steroids.  Continue current regimen  Debility/deconditioning: Patient seen by physical therapy and occupational therapy and recommended skilled nursing facility on discharge.             DVT prophylaxis: Lovenox Code Status: Full Family Communication: daughter at bedside  Status is: Inpatient  Remains inpatient appropriate because:IV treatments appropriate due to intensity of illness or inability to take PO   Dispo: The patient is from: Home              Anticipated d/c is to: SNF              Anticipated d/c date is: Not sure for now              Patient  currently is not medically stable to d/c.  Is still on very high oxygen requirement, needs cardiology clearance before discharge   Consultants: Cardiology  Procedures:None  Antimicrobials:  Anti-infectives (From admission,  onward)   None      Subjective: Patient seen and examined at the bedside today.  Hemodynamically stable.  Comfortable.  Still on 9 to 10 L of oxygen per minute.  Daughter was at the bedside.  Denies any shortness of breath or cough at rest.  States she feels better today.   Objective: Vitals:   02/16/20 1931 02/16/20 2000 02/17/20 0256 02/17/20 0305  BP: (!) 105/59   (!) 118/58  Pulse: 60   59  Resp: 16   21  Temp: 97.9 F (36.6 C)   98.9 F (37.2 C)  TempSrc: Oral   Oral  SpO2: 95% 94%  96%  Weight:   79.1 kg   Height:        Intake/Output Summary (Last 24 hours) at 02/17/2020 5284 Last data filed at 02/17/2020 1324 Gross per 24 hour  Intake 720 ml  Output 3150 ml  Net -2430 ml   Filed Weights   02/15/20 0628 02/16/20 0600 02/17/20 0256  Weight: 81.4 kg 82.4 kg 79.1 kg    Examination:  General exam: Pleasant elderly female, not in distress HEENT:PERRL,Oral mucosa moist, Ear/Nose normal on gross exam Respiratory system: Bilateral basal crackles  cardiovascular system: S1 & S2 heard, RRR. No JVD, murmurs, rubs, gallops or clicks. Gastrointestinal system: Abdomen is nondistended, soft and nontender. No organomegaly or masses felt. Normal bowel sounds heard. Central nervous system: Alert and oriented. No focal neurological deficits. Extremities: No edema, no clubbing ,no cyanosis Skin: No rashes, lesions or ulcers,no icterus ,no pallor  Data Reviewed: I have personally reviewed following labs and imaging studies  CBC: Recent Labs  Lab 02/11/20 2014 02/15/20 1713 02/15/20 1717 02/15/20 1818  WBC 4.0  --   --  6.5  HGB 11.1* 10.2*  12.6 12.2 11.3*  HCT 37.1 30.0*  37.0 36.0 36.9  MCV 90.0  --   --  88.1  PLT 205  --   --  401   Basic Metabolic Panel: Recent Labs  Lab 02/11/20 2014 02/11/20 2014 02/13/20 0439 02/13/20 0439 02/14/20 0702 02/14/20 0702 02/15/20 0428 02/15/20 1713 02/15/20 1717 02/15/20 1818 02/16/20 0110 02/17/20 0358  NA 141   < >  141   < > 139   < > 136 151*  142 143  --  139 138  K 3.6   < > 3.5   < > 4.1   < > 4.2 3.1*  4.1 3.9  --  4.2 4.4  CL 103   < > 104  --  101  --  99  --   --   --  102 101  CO2 30   < > 27  --  26  --  28  --   --   --  28 29  GLUCOSE 132*   < > 203*  --  210*  --  310*  --   --   --  155* 114*  BUN 22   < > 26*  --  37*  --  48*  --   --   --  49* 48*  CREATININE 1.80*   < > 1.80*   < > 1.98*  --  2.17*  --   --  2.04* 1.88* 1.76*  CALCIUM 9.1   < > 9.1  --  8.9  --  8.5*  --   --   --  8.3* 8.5*  MG 2.0  --   --   --   --   --   --   --   --   --   --   --    < > = values in this interval not displayed.   GFR: Estimated Creatinine Clearance: 24.4 mL/min (A) (by C-G formula based on SCr of 1.76 mg/dL (H)). Liver Function Tests: No results for input(s): AST, ALT, ALKPHOS, BILITOT, PROT, ALBUMIN in the last 168 hours. No results for input(s): LIPASE, AMYLASE in the last 168 hours. No results for input(s): AMMONIA in the last 168 hours. Coagulation Profile: No results for input(s): INR, PROTIME in the last 168 hours. Cardiac Enzymes: No results for input(s): CKTOTAL, CKMB, CKMBINDEX, TROPONINI in the last 168 hours. BNP (last 3 results) No results for input(s): PROBNP in the last 8760 hours. HbA1C: Recent Labs    02/15/20 0702  HGBA1C 6.5*   CBG: Recent Labs  Lab 02/16/20 0553 02/16/20 1141 02/16/20 1627 02/16/20 2051 02/17/20 0621  GLUCAP 146* 146* 176* 112* 113*   Lipid Profile: No results for input(s): CHOL, HDL, LDLCALC, TRIG, CHOLHDL, LDLDIRECT in the last 72 hours. Thyroid Function Tests: No results for input(s): TSH, T4TOTAL, FREET4, T3FREE, THYROIDAB in the last 72 hours. Anemia Panel: No results for input(s): VITAMINB12, FOLATE, FERRITIN, TIBC, IRON, RETICCTPCT in the last 72 hours. Sepsis Labs: No results for input(s): PROCALCITON, LATICACIDVEN in the last 168 hours.  Recent Results (from the past 240 hour(s))  SARS Coronavirus 2 by RT PCR (hospital order,  performed in [facility] hospital lab) Nasopharyngeal Nasopharyngeal Swab     Status: None   Collection Time: 02/12/20  1:55 AM   Specimen: Nasopharyngeal Swab  Result Value Ref Range Status   SARS Coronavirus 2 NEGATIVE NEGATIVE Final    Comment: (NOTE) SARS-CoV-2 target nucleic acids are NOT DETECTED.  The SARS-CoV-2 RNA is generally detectable in upper and lower respiratory specimens during the acute phase of infection. The lowest concentration of SARS-CoV-2 viral copies this assay can detect is 250 copies / mL. A negative result does not preclude SARS-CoV-2 infection and should not be used as the sole basis for treatment or other patient management decisions.  A negative result may occur with improper specimen collection / handling, submission of specimen other than nasopharyngeal swab, presence of viral mutation(s) within the areas targeted by this assay, and inadequate number of viral copies (<250 copies / mL). A negative result must be combined with clinical observations, patient history, and epidemiological information.  Fact Sheet for Patients:   StrictlyIdeas.no  Fact Sheet for Healthcare Providers: BankingDealers.co.za  This test is not yet approved or  cleared by the [place] FDA and has been authorized for detection and/or diagnosis of SARS-CoV-2 by FDA under an Emergency Use Authorization (EUA).  This EUA will remain in effect (meaning this test can be used) for the duration of the COVID-19 declaration under Section 564(b)(1) of the Act, 21 U.S.C. section 360bbb-3(b)(1), unless the authorization is terminated or revoked sooner.  Performed at [facility], [place] [address] [place], [place] [address]          Radiology Studies: CARDIAC CATHETERIZATION  Result Date: 02/15/2020 Findings: RA = 20 RV = 72/27 PA = 79/44 (53) PCW = 18 Fick cardiac output/index = 5.35/2.9 Thermo CO/CI = 4.4/2.4 PVR = 6.5 (Fick)  8.0 (Thermo) Ao sat = 94%  PA sat = 64%, 64% SVC sat = 61% Assessment: 1. Severe PAH with normal left-sided pressures Plan/Discussion: There is near equalization or diastolic pressures which can reflect restrictive physiology or elevated R-sided pressure with normal left pressures. Given relatively normal PCWP - restriction felt to be less likely. Glori Bickers, MD 5:36 PM  CT Chest High Resolution  Result Date: 02/15/2020 CLINICAL DATA:  Interstitial lung disease, pulmonary hypertension EXAM: CT CHEST WITHOUT CONTRAST TECHNIQUE: Multidetector CT imaging of the chest was performed following the standard protocol without intravenous contrast. High resolution imaging of the lungs, as well as inspiratory and expiratory imaging, was performed. COMPARISON:  None. FINDINGS: Cardiovascular: Aortic atherosclerosis. Cardiomegaly. Scattered Three-vessel coronary artery calcifications. Left chest multi lead pacer. Enlargement of the main pulmonary artery up to 3.6 cm. No pericardial effusion. Mediastinum/Nodes: No enlarged mediastinal, hilar, or axillary lymph nodes. Thyroid gland, trachea, and esophagus demonstrate no significant findings. Lungs/Pleura: Small bilateral pleural effusions and associated atelectasis or consolidation. No evidence of fibrotic interstitial lung disease. No significant air trapping on expiratory phase imaging. Upper Abdomen: No acute abnormality. Musculoskeletal: No chest wall mass or suspicious bone lesions identified. Anasarca. Disc degenerative disease and ankylosis of the thoracic spine IMPRESSION: 1. No evidence of fibrotic interstitial lung disease, however examination of the lung bases is limited by the presence of pleural effusions. Follow-up examination could be used to assess for mild bibasilar fibrotic interstitial lung disease at the resolution of pleural effusions, if warranted by ongoing suspicion for fibrotic interstitial lung disease. 2. Small bilateral pleural effusions and  associated atelectasis or consolidation. 3. Cardiomegaly and coronary artery disease. 4. Enlargement of the main pulmonary artery, as can be seen in pulmonary hypertension. 5. Anasarca. 6. Aortic Atherosclerosis (ICD10-I70.0). Electronically Signed   By: Eddie Candle M.D.   On: 02/15/2020 20:28        Scheduled Meds: . amLODipine  10 mg Oral Daily  . aspirin  81 mg Oral QHS  . atorvastatin  40 mg Oral Daily  . budesonide (PULMICORT) nebulizer solution  0.5 mg Nebulization BID  . carvedilol  25 mg Oral BID WC  . enoxaparin (LOVENOX) injection  30 mg Subcutaneous Q24H  . ferrous sulfate  325 mg Oral Q breakfast  . hydrALAZINE  25 mg Oral Q8H  . insulin aspart  0-20 Units Subcutaneous TID WC  . insulin aspart  0-5 Units Subcutaneous QHS  . ipratropium-albuterol  3 mL Nebulization BID  . mirabegron ER  50 mg Oral Daily  . potassium chloride  20 mEq Oral Daily  . sildenafil  20 mg Oral TID  . sodium chloride flush  3 mL Intravenous Q12H  . torsemide  40 mg Oral Daily   And  . torsemide  20 mg Oral q1800   Continuous Infusions: . sodium chloride       LOS: 5 days    Time spent: 35 mins,More than 50% of that time was spent in counseling and/or coordination of care.      Shelly Coss, MD [facility] P7/31/2021, 8:22 AM

## 2020-02-18 LAB — BASIC METABOLIC PANEL
Anion gap: 11 (ref 5–15)
BUN: 47 mg/dL — ABNORMAL HIGH (ref 8–23)
CO2: 32 mmol/L (ref 22–32)
Calcium: 9 mg/dL (ref 8.9–10.3)
Chloride: 96 mmol/L — ABNORMAL LOW (ref 98–111)
Creatinine, Ser: 1.98 mg/dL — ABNORMAL HIGH (ref 0.44–1.00)
GFR calc Af Amer: 27 mL/min — ABNORMAL LOW (ref >60)
GFR calc non Af Amer: 23 mL/min — ABNORMAL LOW (ref >60)
Glucose, Bld: 128 mg/dL — ABNORMAL HIGH (ref 70–99)
Potassium: 4.3 mmol/L (ref 3.5–5.1)
Sodium: 139 mmol/L (ref 135–145)

## 2020-02-18 LAB — GLUCOSE, CAPILLARY
Glucose-Capillary: 137 mg/dL — ABNORMAL HIGH (ref 70–99)
Glucose-Capillary: 246 mg/dL — ABNORMAL HIGH (ref 70–99)
Glucose-Capillary: 184 mg/dL — ABNORMAL HIGH (ref 70–99)
Glucose-Capillary: 159 mg/dL — ABNORMAL HIGH (ref 70–99)

## 2020-02-18 NOTE — Progress Notes (Signed)
Progress Note  Patient Name: [name] Date of Encounter: 02/18/2020  [facility] Cardiologist: Sanda Klein, MD   Subjective   Some improvement in SOB  Inpatient Medications    Scheduled Meds: . amLODipine  10 mg Oral Daily  . aspirin  81 mg Oral QHS  . atorvastatin  40 mg Oral Daily  . budesonide (PULMICORT) nebulizer solution  0.5 mg Nebulization BID  . carvedilol  25 mg Oral BID WC  . enoxaparin (LOVENOX) injection  30 mg Subcutaneous Q24H  . ferrous sulfate  325 mg Oral Q breakfast  . hydrALAZINE  25 mg Oral Q8H  . insulin aspart  0-20 Units Subcutaneous TID WC  . insulin aspart  0-5 Units Subcutaneous QHS  . ipratropium-albuterol  3 mL Nebulization BID  . mirabegron ER  50 mg Oral Daily  . potassium chloride  20 mEq Oral Daily  . sildenafil  20 mg Oral TID  . sodium chloride flush  3 mL Intravenous Q12H  . torsemide  40 mg Oral Daily   And  . torsemide  20 mg Oral q1800   Continuous Infusions: . sodium chloride     PRN Meds: sodium chloride, acetaminophen, ondansetron (ZOFRAN) IV, polyethylene glycol, sodium chloride flush   Vital Signs    Vitals:   02/17/20 1947 02/17/20 2002 02/17/20 2205 02/18/20 0427  BP:  (!) 96/60  (!) 132/67  Pulse:  60 60 59  Resp:  _0 Temp:  98.4 F (36.9 C)  98.4 F (36.9 C)  TempSrc:  Oral  Oral  SpO2: 93% 96% 90% 100%  Weight:    78.1 kg  Height:        Intake/Output Summary (Last 24 hours) at 02/18/2020 0737 Last data filed at 02/18/2020 0429 Gross per 24 hour  Intake 820 ml  Output 2550 ml  Net -1730 ml   Last 3 Weights 02/18/2020 02/17/2020 02/16/2020  Weight (lbs) 172 lb 3.2 oz 174 lb 6.4 oz 181 lb 11.2 oz  Weight (kg) 78.109 kg 79.107 kg 82.419 kg      Telemetry    A paced V sensed- Personally Reviewed  ECG    n/a - Personally Reviewed  Physical Exam   GEN: No acute distress.   Neck: No JVD Cardiac: RRR, no murmurs, rubs, or gallops.  Respiratory: decreased breath sounds bilateral  bases GI: Soft, nontender, non-distended  MS: No edema; No deformity. Neuro:  Nonfocal  Psych: Normal affect   Labs    High Sensitivity Troponin:   Recent Labs  Lab 02/11/20 2014 02/11/20 2247  TROPONINIHS 17 18*      Chemistry Recent Labs  Lab 02/15/20 0428 02/15/20 0428 02/15/20 1713 02/15/20 1717 02/15/20 1818 02/16/20 0110 02/17/20 0358  NA 136  --    < > 143  --  139 138  K 4.2  --    < > 3.9  --  4.2 4.4  CL 99  --   --   --   --  102 101  CO2 28  --   --   --   --  28 29  GLUCOSE 310*  --   --   --   --  155* 114*  BUN 48*  --   --   --   --  49* 48*  CREATININE 2.17*   < >  --   --  2.04* 1.88* 1.76*  CALCIUM 8.5*  --   --   --   --  8.3* 8.5*  GFRNONAA 21*   < >  --   --  22* 25* 27*  GFRAA 24*   < >  --   --  26* 29* 31*  ANIONGAP 9  --   --   --   --  9 8   < > = values in this interval not displayed.     Hematology Recent Labs  Lab 02/11/20 2014 02/11/20 2014 02/15/20 1713 02/15/20 1717 02/15/20 1818  WBC 4.0  --   --   --  6.5  RBC 4.12  --   --   --  4.19  HGB 11.1*   < > 10.2*  12.6 12.2 11.3*  HCT 37.1   < > 30.0*  37.0 36.0 36.9  MCV 90.0  --   --   --  88.1  MCH 26.9  --   --   --  27.0  MCHC 29.9*  --   --   --  30.6  RDW 15.5  --   --   --  15.2  PLT 205  --   --   --  190   < > = values in this interval not displayed.    BNP Recent Labs  Lab 02/11/20 2014  BNP 617.2*     DDimer  Recent Labs  Lab 02/12/20 0220  DDIMER 1.07*     Radiology    No results found.  Cardiac Studies    Patient Profile     79 y.o.femalewith a history of nonischemic cardiomyopathy (EF 45% echo 2016and 2021),minor nonobstructive CAD (cath 2016), severe pulmonary hypertension, obstructive sleep apnea and restrictive lung disease, essential hypertension, hyperlipidemia and type 2 diabetes mellitus presenting with acute exacerbation of combined systolic and diastolic heart failure with both right and left heart failure features.After  diuresis, still has severe PAH and hypoxia.  Assessment & Plan    1. . Pulmonary HTN with cor pulmonale - 01/2020 cath mean PA 53, PCWP 18, CI 2.4 - 01/2020 echo LVEF 45-50%, indet DDx, severe RV dysfunction, PASP 52, mod to severe TR - Presumed secondary to chronic restrictive lung disease and obstructive sleep apnea, WHO group 3 with potential group I and II component  - VQ negative for PE - high rest CT no fibrosis - Previous PFTs(2018)showed no obstruction but fairly significant restriction (65% of predicted)and markedly reduced DLCO(39%, corrected for alveolar volume) - started on sildenafil, monitor for any wosrening hypoxia remains stable since starting sildenafil, on 9L [place] down from 10  2. Acute on chronic systolic HF complicated by RV failure/cor pulmonale  01/2020 echo LVEF 45-50%, indet DDx, severe RV dysfunction, PASP 52, mod to severe TR - Mildnonobstructive CAD by [facility] in 2006 and againin 2015.   - changed to oral torsemide 61m in am 266min pm by HF team. On this regimen has been net neg 2L each day.  - neg 2 L yesterday, neg 5.5 L since admission. Labs are pending, had a downtrend in Cr yesterday on this regimen  3. Paroxysmal atach - no inpatient issues   4. CKD 3 - continue to follow Cr, downtrend from yesterday with change to torsemide.   5. Chronic hypoxic resp failure - followed by pulmonary - combined chronic hypoxic/hypercapneic failure due to COPD, OSA, restrictive lung disease  6. HTN - occasional low bp's, we will d/c her hydralazine  For questions or updates, please contact [facility] consult [email or web address] for contact info under  Merrily Pew, MD  02/18/2020, 7:37 AM

## 2020-02-18 NOTE — Progress Notes (Signed)
PROGRESS NOTE    [name]  TGG:[id number] DOB: July 14, 1941 DOA: 02/11/2020 PCP: Girtha Rm, NP-C   Brief Narrative:  Patient is 79 year old female with history of chronic combined systolic/diastolic CHF, CKD stage IV, hypertension, OSA on CPAP, diabetes type 2 who was brought to the emergency room with complaints of shortness of breath, bilateral lower extremity swelling, weight gain.  Patient was found to have acute on chronic CHF exacerbation .  Cardiology consulted and following.  Underwent  right heart cath on 02/14/30.  Continues to require 9 to 10 L of oxygen per minute.  Assessment & Plan:   Principal Problem:   Acute respiratory failure with hypoxia (HCC) Active Problems:   Essential hypertension   COPD (chronic obstructive pulmonary disease) (HCC)   OSA on CPAP   CKD (chronic kidney disease) stage 4, GFR 15-29 ml/min (HCC)   Coronary artery disease involving native coronary artery of native heart without angina pectoris   Acute on chronic combined systolic and diastolic CHF (congestive heart failure) (HCC)   CHF exacerbation (HCC)   Acute on chronic hypoxic respiratory failure: Multifactorial.  Underlying CHF, severe pulmonary hypertension,COPD and also possible restrictive lung disease .  This morning she was on nasal cannula at 9L/min.  Continue to monitor respiratory status.  She is on oxygen at night at home at 2L/min.  High-resolution CT scan did not show any fibrotic lung disease  Acute on chronic systolic congestive heart failure: Presented with shortness of breath, lower extremity swelling. echocardiogram done here showed ejection fraction of 45 to 50%, global hypokinesis, severely reduced right ventricular systolic function, elevated pulmonary artery systolic pressure.  Findings consistent with cor pulmonale.  Cardiology following.    Continue to monitor weight, input/output.Continue torsemide.  Underwent right heart catheterization which showed severe pulmonary  hypertension .patient also has ICD for complete heart block  Pulmonary artery hypertension:Likely secondary to  chronic restrictive lung disease, obstructive sleep apnea.  Right heart catheterization done which showed severe pulmonary artery hypertension.  Continue on  sildenafil  Chronic respiratory insufficiency/restrictive lung disease: PFTs had shown significant restrictive lung disease with marked reduced DLCO.  Also has history of OSA on CPAP.  She says she is compliant with that.  Follows with pulmonology Dr. Halford Chessman. VQ scan done here did not show any pulmonary embolism.  Pulmonology following.  Paroxysmal atrial tachycardia: Cardiology following.  Continue current management  Hypertension: Monitor blood pressure.  Continue current regimen.  Coronary artery disease: Previous cardiac cath showed minor nonobstructive disease.  On statin, aspirin, beta-blocker.  CKD stage IIIb: Baseline creatinine ranges from 1.6-1.9.  We will continue to monitor.    Diabetes type 2: Recent hemoglobin A1c of 6.  Currently diet-controlled only.  Currently hyperglycemic most likely secondary to steroids.  Continue current regimen  Debility/deconditioning: Patient seen by physical therapy and occupational therapy and recommended skilled nursing facility on discharge.             DVT prophylaxis: Lovenox Code Status: Full Family Communication: daughter at bedside  Status is: Inpatient  Remains inpatient appropriate because:IV treatments appropriate due to intensity of illness or inability to take PO   Dispo: The patient is from: Home              Anticipated d/c is to: SNF              Anticipated d/c date is: Not sure for now              Patient currently  is not medically stable to d/c.  Is still on very high oxygen requirement, needs cardiology clearance before discharge   Consultants: Cardiology  Procedures:None  Antimicrobials:  Anti-infectives (From admission, onward)   None       Subjective: Patient seen and examined at the bedside today.  Appears much better today.  Very comfortable but denies any complaints.  Sitting on the chair.  But she is still on 9 L of oxygen per minute.   Objective: Vitals:   02/17/20 1947 02/17/20 2002 02/17/20 2205 02/18/20 0427  BP:  (!) 96/60  (!) 132/67  Pulse:  60 60 59  Resp:  _0 Temp:  98.4 F (36.9 C)  98.4 F (36.9 C)  TempSrc:  Oral  Oral  SpO2: 93% 96% 90% 100%  Weight:    78.1 kg  Height:        Intake/Output Summary (Last 24 hours) at 02/18/2020 0817 Last data filed at 02/18/2020 0429 Gross per 24 hour  Intake 820 ml  Output 2550 ml  Net -1730 ml   Filed Weights   02/16/20 0600 02/17/20 0256 02/18/20 0427  Weight: 82.4 kg 79.1 kg 78.1 kg    Examination:   General exam: Pleasant elderly female, comfortable Respiratory system: Bilateral basal crackles  cardiovascular system: S1 & S2 heard, RRR. No JVD, murmurs, rubs, gallops or clicks. Gastrointestinal system: Abdomen is nondistended, soft and nontender. No organomegaly or masses felt. Normal bowel sounds heard. Central nervous system: Alert and oriented. No focal neurological deficits. Extremities: No edema, no clubbing ,no cyanosis Skin: No rashes, lesions or ulcers,no icterus ,no pallor    Data Reviewed: I have personally reviewed following labs and imaging studies  CBC: Recent Labs  Lab 02/11/20 2014 02/15/20 1713 02/15/20 1717 02/15/20 1818  WBC 4.0  --   --  6.5  HGB 11.1* 10.2*  12.6 12.2 11.3*  HCT 37.1 30.0*  37.0 36.0 36.9  MCV 90.0  --   --  88.1  PLT 205  --   --  837   Basic Metabolic Panel: Recent Labs  Lab 02/11/20 2014 02/13/20 0439 02/14/20 0702 02/14/20 0702 02/15/20 0428 02/15/20 0428 02/15/20 1713 02/15/20 1717 02/15/20 1818 02/16/20 0110 02/17/20 0358 02/18/20 0629  NA 141   < > 139   < > 136   < > 151*  142 143  --  139 138 139  K 3.6   < > 4.1   < > 4.2   < > 3.1*  4.1 3.9  --  4.2 4.4 4.3  CL  103   < > 101  --  99  --   --   --   --  102 101 96*  CO2 30   < > 26  --  28  --   --   --   --  28 29 32  GLUCOSE 132*   < > 210*  --  310*  --   --   --   --  155* 114* 128*  BUN 22   < > 37*  --  48*  --   --   --   --  49* 48* 47*  CREATININE 1.80*   < > 1.98*   < > 2.17*  --   --   --  2.04* 1.88* 1.76* 1.98*  CALCIUM 9.1   < > 8.9  --  8.5*  --   --   --   --  8.3* 8.5*  9.0  MG 2.0  --   --   --   --   --   --   --   --   --   --   --    < > = values in this interval not displayed.   GFR: Estimated Creatinine Clearance: 21.6 mL/min (A) (by C-G formula based on SCr of 1.98 mg/dL (H)). Liver Function Tests: No results for input(s): AST, ALT, ALKPHOS, BILITOT, PROT, ALBUMIN in the last 168 hours. No results for input(s): LIPASE, AMYLASE in the last 168 hours. No results for input(s): AMMONIA in the last 168 hours. Coagulation Profile: No results for input(s): INR, PROTIME in the last 168 hours. Cardiac Enzymes: No results for input(s): CKTOTAL, CKMB, CKMBINDEX, TROPONINI in the last 168 hours. BNP (last 3 results) No results for input(s): PROBNP in the last 8760 hours. HbA1C: No results for input(s): HGBA1C in the last 72 hours. CBG: Recent Labs  Lab 02/17/20 0621 02/17/20 1107 02/17/20 1729 02/17/20 2121 02/18/20 0605  GLUCAP 113* 178* 128* 203* 137*   Lipid Profile: No results for input(s): CHOL, HDL, LDLCALC, TRIG, CHOLHDL, LDLDIRECT in the last 72 hours. Thyroid Function Tests: No results for input(s): TSH, T4TOTAL, FREET4, T3FREE, THYROIDAB in the last 72 hours. Anemia Panel: No results for input(s): VITAMINB12, FOLATE, FERRITIN, TIBC, IRON, RETICCTPCT in the last 72 hours. Sepsis Labs: No results for input(s): PROCALCITON, LATICACIDVEN in the last 168 hours.  Recent Results (from the past 240 hour(s))  SARS Coronavirus 2 by RT PCR (hospital order, performed in [facility] hospital lab) Nasopharyngeal Nasopharyngeal Swab     Status: None   Collection Time:  02/12/20  1:55 AM   Specimen: Nasopharyngeal Swab  Result Value Ref Range Status   SARS Coronavirus 2 NEGATIVE NEGATIVE Final    Comment: (NOTE) SARS-CoV-2 target nucleic acids are NOT DETECTED.  The SARS-CoV-2 RNA is generally detectable in upper and lower respiratory specimens during the acute phase of infection. The lowest concentration of SARS-CoV-2 viral copies this assay can detect is 250 copies / mL. A negative result does not preclude SARS-CoV-2 infection and should not be used as the sole basis for treatment or other patient management decisions.  A negative result may occur with improper specimen collection / handling, submission of specimen other than nasopharyngeal swab, presence of viral mutation(s) within the areas targeted by this assay, and inadequate number of viral copies (<250 copies / mL). A negative result must be combined with clinical observations, patient history, and epidemiological information.  Fact Sheet for Patients:   StrictlyIdeas.no  Fact Sheet for Healthcare Providers: BankingDealers.co.za  This test is not yet approved or  cleared by the [place] FDA and has been authorized for detection and/or diagnosis of SARS-CoV-2 by FDA under an Emergency Use Authorization (EUA).  This EUA will remain in effect (meaning this test can be used) for the duration of the COVID-19 declaration under Section 564(b)(1) of the Act, 21 U.S.C. section 360bbb-3(b)(1), unless the authorization is terminated or revoked sooner.  Performed at [facility], [place] [address] [place], [place] [address]          Radiology Studies: No results found.      Scheduled Meds: . amLODipine  10 mg Oral Daily  . aspirin  81 mg Oral QHS  . atorvastatin  40 mg Oral Daily  . budesonide (PULMICORT) nebulizer solution  0.5 mg Nebulization BID  . carvedilol  25 mg Oral BID WC  . enoxaparin (LOVENOX)  injection  30 mg  Subcutaneous Q24H  . ferrous sulfate  325 mg Oral Q breakfast  . insulin aspart  0-20 Units Subcutaneous TID WC  . insulin aspart  0-5 Units Subcutaneous QHS  . ipratropium-albuterol  3 mL Nebulization BID  . mirabegron ER  50 mg Oral Daily  . potassium chloride  20 mEq Oral Daily  . sildenafil  20 mg Oral TID  . sodium chloride flush  3 mL Intravenous Q12H  . torsemide  40 mg Oral Daily   And  . torsemide  20 mg Oral q1800   Continuous Infusions: . sodium chloride       LOS: 6 days    Time spent: 35 mins,More than 50% of that time was spent in counseling and/or coordination of care.      Shelly Coss, MD [facility] P8/07/2019, 8:17 AM

## 2020-02-18 NOTE — TOC Progression Note (Signed)
Transition of Care [facility]) - Progression Note    Patient Details  Name: [name] MRN: [id number] Date of Birth: 1941-05-17  Transition of Care [facility]) CM/SW [place], LCSW Phone Number: 02/18/2020, 4:49 PM  Clinical Narrative:     CSW met with pt bedside to provide bed offers. Patient asked if her daughter [name] could be called. CSW called [name] and informed them that patient had 2 bed offers. [name] stated that she wanted to research the facilities more and inquired about the referral being sent out to [facility]. CSW informed [name] that she was not sure if they accepted [facility] however would fax out.  [name] stated that her sister would be at hospital after 12:pm tomorrow.  TOC team will continue to assist with discharge planning needs.  Expected Discharge Plan: [place] Barriers to Discharge: Continued Medical Work up, Ship broker  Expected Discharge Plan and Services Expected Discharge Plan: [place] arrangements for the past 2 months: Single Family Home                                       Social Determinants of Health (SDOH) Interventions    Readmission Risk Interventions No flowsheet data found.

## 2020-02-19 ENCOUNTER — Inpatient Hospital Stay (HOSPITAL_COMMUNITY): Payer: Medicare PPO

## 2020-02-19 DIAGNOSIS — I272 Pulmonary hypertension, unspecified: Secondary | ICD-10-CM

## 2020-02-19 LAB — BASIC METABOLIC PANEL
Anion gap: 9 (ref 5–15)
BUN: 47 mg/dL — ABNORMAL HIGH (ref 8–23)
CO2: 35 mmol/L — ABNORMAL HIGH (ref 22–32)
Calcium: 8.7 mg/dL — ABNORMAL LOW (ref 8.9–10.3)
Chloride: 93 mmol/L — ABNORMAL LOW (ref 98–111)
Creatinine, Ser: 1.96 mg/dL — ABNORMAL HIGH (ref 0.44–1.00)
GFR calc Af Amer: 27 mL/min — ABNORMAL LOW (ref >60)
GFR calc non Af Amer: 23 mL/min — ABNORMAL LOW (ref >60)
Glucose, Bld: 113 mg/dL — ABNORMAL HIGH (ref 70–99)
Potassium: 4.4 mmol/L (ref 3.5–5.1)
Sodium: 137 mmol/L (ref 135–145)

## 2020-02-19 LAB — GLUCOSE, CAPILLARY
Glucose-Capillary: 116 mg/dL — ABNORMAL HIGH (ref 70–99)
Glucose-Capillary: 229 mg/dL — ABNORMAL HIGH (ref 70–99)
Glucose-Capillary: 185 mg/dL — ABNORMAL HIGH (ref 70–99)
Glucose-Capillary: 206 mg/dL — ABNORMAL HIGH (ref 70–99)

## 2020-02-19 MED ORDER — SODIUM CHLORIDE 0.9 % IV SOLN
2.0000 g | INTRAVENOUS | Status: DC
  Administered 2020-02-20 – 2020-02-21 (×3): 2 g via INTRAVENOUS
  Filled 2020-02-19: qty 20
  Filled 2020-02-19: qty 2
  Filled 2020-02-19 (×2): qty 20

## 2020-02-19 MED ORDER — ACETAMINOPHEN 325 MG PO TABS: 650.0000 mg | ORAL_TABLET | ORAL | Status: DC | PRN

## 2020-02-19 MED ORDER — ACETAMINOPHEN 325 MG PO TABS
325.0000 mg | ORAL_TABLET | Freq: Once | ORAL | Status: AC
  Administered 2020-02-19: 325 mg via ORAL
  Filled 2020-02-19: qty 1

## 2020-02-19 MED ORDER — SODIUM CHLORIDE 0.9 % IV SOLN
500.0000 mg | INTRAVENOUS | Status: DC
  Administered 2020-02-19 – 2020-02-21 (×3): 500 mg via INTRAVENOUS
  Filled 2020-02-19 (×3): qty 500

## 2020-02-19 NOTE — Progress Notes (Signed)
   Cardiology has signed off. Follow-up with [facility] has been arranged for 03/05/2020 at 10:30am. Patient will need repeat BMET in about 1 week. It looks like SNF has been recommended so this can be performed there.  Darreld Mclean, PA-C 02/19/2020 10:09 AM

## 2020-02-19 NOTE — Progress Notes (Signed)
Progress Note  Patient Name: [name] Date of Encounter: 02/19/2020  [facility] Cardiologist: Sanda Klein, MD   Subjective   No acute overnight events. Patient feels like breathing as improved and almost back to baseline but not quire. She still has wheezing/rhonchi on exam. No chest pain. No palpitations.  Inpatient Medications    Scheduled Meds: . amLODipine  10 mg Oral Daily  . aspirin  81 mg Oral QHS  . atorvastatin  40 mg Oral Daily  . budesonide (PULMICORT) nebulizer solution  0.5 mg Nebulization BID  . carvedilol  25 mg Oral BID WC  . enoxaparin (LOVENOX) injection  30 mg Subcutaneous Q24H  . ferrous sulfate  325 mg Oral Q breakfast  . insulin aspart  0-20 Units Subcutaneous TID WC  . insulin aspart  0-5 Units Subcutaneous QHS  . ipratropium-albuterol  3 mL Nebulization BID  . mirabegron ER  50 mg Oral Daily  . potassium chloride  20 mEq Oral Daily  . sildenafil  20 mg Oral TID  . sodium chloride flush  3 mL Intravenous Q12H  . torsemide  40 mg Oral Daily   And  . torsemide  20 mg Oral q1800   Continuous Infusions: . sodium chloride     PRN Meds: sodium chloride, acetaminophen, ondansetron (ZOFRAN) IV, polyethylene glycol, sodium chloride flush   Vital Signs    Vitals:   02/18/20 2040 02/18/20 2040 02/18/20 2203 02/19/20 0436  BP: (!) 114/58 (!) 114/58  (!) 109/59  Pulse: 61 61 60 60  Resp: _0 Temp: (!) 100.4 F (38 C) (!) 100.4 F (38 C)  98.8 F (37.1 C)  TempSrc: Oral Oral  Oral  SpO2: 96% 96% 95% 96%  Weight:    76.1 kg  Height:        Intake/Output Summary (Last 24 hours) at 02/19/2020 0736 Last data filed at 02/19/2020 0438 Gross per 24 hour  Intake 480 ml  Output 3200 ml  Net -2720 ml   Last 3 Weights 02/19/2020 02/18/2020 02/17/2020  Weight (lbs) 167 lb 12.8 oz 172 lb 3.2 oz 174 lb 6.4 oz  Weight (kg) 76.114 kg 78.109 kg 79.107 kg      Telemetry    A-paced with rates in the 60's. - Personally Reviewed  ECG    No  new ECG tracing since 7/25. - Personally Reviewed  Physical Exam   GEN: No acute distress.   Neck: Supple. Possible JVD with patient sitting completely upright. Cardiac: RRR. Difficult to appreciate any murmurs over rhonchi. Respiratory: No significant increased work of breathing. Decreased breath sounds throughout with expiratory rhonchi/wheezing. GI: Soft, non-tender, non-distended  MS: No to trace lower extremity edema. No deformity. Skin: Warm and dry. Neuro:  No focal deficits. Psych: Normal affect.  Labs    High Sensitivity Troponin:   Recent Labs  Lab 02/11/20 2014 02/11/20 2247  TROPONINIHS 17 18*      Chemistry Recent Labs  Lab 02/17/20 0358 02/18/20 0629 02/19/20 0532  NA 138 139 137  K 4.4 4.3 4.4  CL 101 96* 93*  CO2 29 32 35*  GLUCOSE 114* 128* 113*  BUN 48* 47* 47*  CREATININE 1.76* 1.98* 1.96*  CALCIUM 8.5* 9.0 8.7*  GFRNONAA 27* 23* 23*  GFRAA 31* 27* 27*  ANIONGAP _1 Hematology Recent Labs  Lab 02/15/20 1713 02/15/20 1717 02/15/20 1818  WBC  --   --  6.5  RBC  --   --  4.19  HGB 10.2*  12.6 12.2 11.3*  HCT 30.0*  37.0 36.0 36.9  MCV  --   --  88.1  MCH  --   --  27.0  MCHC  --   --  30.6  RDW  --   --  15.2  PLT  --   --  190    BNPNo results for input(s): BNP, PROBNP in the last 168 hours.   DDimer No results for input(s): DDIMER in the last 168 hours.   Radiology    No results found.  Cardiac Studies   Echocardiogram 02/12/2020: Impressions: 1. Left ventricular ejection fraction, by estimation, is 45 to 50%. The  left ventricle has mildly decreased function. The left ventricle  demonstrates global hypokinesis. Left ventricular diastolic function could  not be evaluated. There is the  interventricular septum is flattened in systole and diastole, consistent  with right ventricular pressure and volume overload.  2. Right ventricular systolic function is severely reduced. The right  ventricular size is severely  enlarged. There is moderately elevated  pulmonary artery systolic pressure. The estimated right ventricular  systolic pressure is 43.1 mmHg.  3. Right atrial size was severely dilated.  4. The mitral valve is grossly normal. Mild mitral valve regurgitation.  No evidence of mitral stenosis.  5. The tricuspid valve is abnormal. Tricuspid valve regurgitation is  moderate to severe.  6. The aortic valve is tricuspid. Aortic valve regurgitation is not  visualized. No aortic stenosis is present.  7. The inferior vena cava is normal in size with greater than 50%  respiratory variability, suggesting right atrial pressure of 3 mmHg.   Comparison(s): No prior Echocardiogram. Unable to view prior  echocardiogram.   Conclusion(s)/Recommendation(s): Findings consistent with Cor Pulmonale.  _______________  Right Heart Catheterization 02/15/2020: Findings: RA = 20 RV = 72/27 PA = 79/44 (53) PCW = 18 Fick cardiac output/index = 5.35/2.9 Thermo CO/CI = 4.4/2.4 PVR = 6.5 (Fick) 8.0 (Thermo) Ao sat = 94% PA sat = 64%, 64% SVC sat = 61%  Assessment: 1. Severe PAH with normal left-sided pressures  Plan/Discussion: There is near equalization or diastolic pressures which can reflect restrictive physiology or elevated R-sided pressure with normal left pressures. Given relatively normal PCWP - restriction felt to be less likely.    Patient Profile     79 y.o. female with a history of minor non-obstructive CAD on cardiac cath in 2016, non-ischemic cardiomyopathy with EF of 2021, severe pulmonary hypertension, restrictive lung disease, obstructive sleep apnea, paroxysmal atrial tachycardia, s/p ICD, hypertension, hyperlipidemia, and type 2 diabetes mellitus who is being seen for evaluation of acute on chronic combined CHF with both right and left heart failure features. [place] on 02/15/2020 showed severe PAH with normal left-sided pressures.   Assessment & Plan    Severe Pulmonary Hypertension  with Cor Pulmonale Chronic Hypoxic/Hypercapneic Respiratory Failure Secondary to COPD, Restrictive Lung Disease, and OSA - Echo on 7/26 showed LVEF of 45-50%, indeterminate diastolic dysfunction, severe RV dysfunction, elevated PASP of 52 mmHg, and moderate to severe TR. - RHC on 7/29 showed mean PA 53, PCWP 18, and CI 2.4. - V/Q scan negative for PE.  - High resolution CT showed no fibrosis. - Previous PFTs in 2018 showed no obstruction but fairly significant restriction (65% of predicted) and markedly reduced DLCO (39%, corrected for alveolar volume).  - Presumed secondary to chronic restrictive lung disease and obstructive sleep apnea (WHO group 3 with potential group I and II component).  -  Started on Sildenafil on 02/16/2020.  - She has rhonchi/wheezing on exam. Currently on 7L via nasal cannula. - Continue management per Pulmonology. - Will [facility] with [facility] after discharge.  Acute on Chronic Systolic CHF With RV Failure  - Echo as above.  - Initially diuresed with IV Lasix but switched to PO Torsemide on 7/30. Documented output of 3.2 L in the past 24 hours and net negative 8.2 L since admission. Weight 167.8 lbs today, down from 172 yesterday and 181 on 7/30. Renal function. - Continue Torsemide 56m in the morning and 283min the evening.  - Continue Coreg 2564mwice daily. - No ACEI/ARB given renal function. - Continue to monitor daily weights, strict I/O's, and renal function.   CAD - Mild non-obstructive CAD noted on cath in 2015.  - High-sensitivity troponin borderline elevated at 17 >> 18. Not consistent with ACS. Consistent with demand ischemia from acute CHF and respiratory failure. - No angina. - Continue aspirin, beta-blocker, and statin.   Paroxysmal Atrial Tachycardia S/p ICD - No episode of atrial tachycardia this admission. - Continue Coreg as above.  Hypertension - BP well controlled.  - Continue Coreg 24m43mice daily and Amlodipine 10mg75mily.  Hyperlipidemia - LDL 38 in 09/2019.  - Continue home Lipitor 40mg 43my.  Type 2 Diabetes Mellitus - Hemoglobin A1c 6.5%. - Management per primary team.   CKD Stage III-IV - Creatinine peaked at 2.17 on 7/29. Stable at 1.96 today. Baseline around 1.6 to 1.9.  - Continue to monitor closely.  For questions or updates, please contact [facility] consult [email or web address] for contact info under        Signed, CallieDarreld Mclean  02/19/2020, 7:36 AM

## 2020-02-19 NOTE — Progress Notes (Signed)
PROGRESS NOTE    [name]  ATF:[id number] DOB: 1940/11/14 DOA: 02/11/2020 PCP: Girtha Rm, NP-C   Brief Narrative:  Patient is 79 year old female with history of chronic combined systolic/diastolic CHF, CKD stage IV, hypertension, OSA on CPAP, diabetes type 2 who was brought to the emergency room with complaints of shortness of breath, bilateral lower extremity swelling, weight gain.  Patient was found to have acute on chronic CHF exacerbation .  Cardiology consulted and following.  Underwent  right heart cath on 02/14/30.  On 5 L of oxygen per minute today.  Overall status has significantly improved.  She is medically stable for discharge to skilled nursing facility as soon as bed is available.  Patient and family requesting for inpatient rehab.  Assessment & Plan:   Principal Problem:   Acute respiratory failure with hypoxia (HCC) Active Problems:   Essential hypertension   COPD (chronic obstructive pulmonary disease) (HCC)   OSA on CPAP   CKD (chronic kidney disease) stage 4, GFR 15-29 ml/min (HCC)   Coronary artery disease involving native coronary artery of native heart without angina pectoris   Acute on chronic combined systolic and diastolic CHF (congestive heart failure) (HCC)   CHF exacerbation (HCC)   Acute on chronic hypoxic respiratory failure: Multifactorial.  Underlying CHF, severe pulmonary hypertension,COPD and also possible restrictive lung disease .  This morning she was on nasal cannula at 5L/min.  Continue to monitor respiratory status.  She is on oxygen at night at home at 2L/min.  High-resolution CT scan did not show any fibrotic lung disease  Acute on chronic systolic congestive heart failure: Presented with shortness of breath, lower extremity swelling. echocardiogram done here showed ejection fraction of 45 to 50%, global hypokinesis, severely reduced right ventricular systolic function, elevated pulmonary artery systolic pressure.  Findings consistent  with cor pulmonale.  Cardiology following.    Continue to monitor weight, input/output.Continue torsemide.  Underwent right heart catheterization which showed severe pulmonary hypertension .patient also has ICD for complete heart block  Pulmonary artery hypertension:Likely secondary to  chronic restrictive lung disease, obstructive sleep apnea.  Right heart catheterization done which showed severe pulmonary artery hypertension.  Continue on  sildenafil  Chronic respiratory insufficiency/restrictive lung disease: PFTs had shown significant restrictive lung disease with marked reduced DLCO.  Also has history of OSA on CPAP.  She says she is compliant with that.  Follows with pulmonology Dr. Halford Chessman. VQ scan done here did not show any pulmonary embolism.  Pulmonology following.  Paroxysmal atrial tachycardia: Cardiology following.  Continue current management  Hypertension: Monitor blood pressure.  Continue current regimen.  Coronary artery disease: Previous cardiac cath showed minor nonobstructive disease.  On statin, aspirin, beta-blocker.  CKD stage IIIb: Baseline creatinine ranges from 1.6-1.9.  We will continue to monitor.    Diabetes type 2: Recent hemoglobin A1c of 6.  Currently diet-controlled only.  Currently hyperglycemic most likely secondary to steroids.  Continue current regimen  Debility/deconditioning: Patient seen by physical therapy and occupational therapy and recommended skilled nursing facility on discharge.  Social worker following.  Patient and family requesting CIR.  I have reconsulted physical therapy/Occupational Therapy.             DVT prophylaxis: Lovenox Code Status: Full Family Communication: daughter on phone Status is: Inpatient  Remains inpatient appropriate because:unsafe DC plan  Dispo: The patient is from: Home              Anticipated d/c is to: SNF vs CIR  Anticipated d/c date is: As soon as possible              Patient currently is  medically stable for discharge.   Consultants: Cardiology  Procedures:None  Antimicrobials:  Anti-infectives (From admission, onward)   None      Subjective: Patient seen and examined the bedside today.  Hemodynamically stable sitting in the chair.  Feels much better.  On 5 L of oxygen per minute.  Denies any shortness of breath or cough.   Objective: Vitals:   02/18/20 2040 02/18/20 2040 02/18/20 2203 02/19/20 0436  BP: (!) 114/58 (!) 114/58  (!) 109/59  Pulse: 61 61 60 60  Resp: _0 Temp: (!) 100.4 F (38 C) (!) 100.4 F (38 C)  98.8 F (37.1 C)  TempSrc: Oral Oral  Oral  SpO2: 96% 96% 95% 96%  Weight:    76.1 kg  Height:        Intake/Output Summary (Last 24 hours) at 02/19/2020 2706 Last data filed at 02/19/2020 2376 Gross per 24 hour  Intake 480 ml  Output 3200 ml  Net -2720 ml   Filed Weights   02/17/20 0256 02/18/20 0427 02/19/20 0436  Weight: 79.1 kg 78.1 kg 76.1 kg    Examination:   General exam: Pleasant elderly female, comfortable  HEENT: Few bibasilar crackles but much improved  respiratory system: Bilateral equal air entry, normal vesicular breath sounds, no wheezes or crackles  Cardiovascular system: S1 & S2 heard, RRR. No JVD, murmurs, rubs, gallops or clicks. Gastrointestinal system: Abdomen is nondistended, soft and nontender. No organomegaly or masses felt. Normal bowel sounds heard. Central nervous system: Alert and oriented.  Extremities: No edema, no clubbing ,no cyanosis Skin: No rashes, lesions or ulcers,no icterus ,no pallor    Data Reviewed: I have personally reviewed following labs and imaging studies  CBC: Recent Labs  Lab 02/15/20 1713 02/15/20 1717 02/15/20 1818  WBC  --   --  6.5  HGB 10.2*  12.6 12.2 11.3*  HCT 30.0*  37.0 36.0 36.9  MCV  --   --  88.1  PLT  --   --  283   Basic Metabolic Panel: Recent Labs  Lab 02/15/20 0428 02/15/20 0428 02/15/20 1713 02/15/20 1717 02/15/20 1818 02/16/20 0110  02/17/20 0358 02/18/20 0629 02/19/20 0532  NA 136  --    < > 143  --  139 138 139 137  K 4.2  --    < > 3.9  --  4.2 4.4 4.3 4.4  CL 99  --   --   --   --  102 101 96* 93*  CO2 28  --   --   --   --  28 29 32 35*  GLUCOSE 310*  --   --   --   --  155* 114* 128* 113*  BUN 48*  --   --   --   --  49* 48* 47* 47*  CREATININE 2.17*   < >  --   --  2.04* 1.88* 1.76* 1.98* 1.96*  CALCIUM 8.5*  --   --   --   --  8.3* 8.5* 9.0 8.7*   < > = values in this interval not displayed.   GFR: Estimated Creatinine Clearance: 21.5 mL/min (A) (by C-G formula based on SCr of 1.96 mg/dL (H)). Liver Function Tests: No results for input(s): AST, ALT, ALKPHOS, BILITOT, PROT, ALBUMIN in the last 168 hours. No results for  input(s): LIPASE, AMYLASE in the last 168 hours. No results for input(s): AMMONIA in the last 168 hours. Coagulation Profile: No results for input(s): INR, PROTIME in the last 168 hours. Cardiac Enzymes: No results for input(s): CKTOTAL, CKMB, CKMBINDEX, TROPONINI in the last 168 hours. BNP (last 3 results) No results for input(s): PROBNP in the last 8760 hours. HbA1C: No results for input(s): HGBA1C in the last 72 hours. CBG: Recent Labs  Lab 02/18/20 0605 02/18/20 1114 02/18/20 1758 02/18/20 2143 02/19/20 0620  GLUCAP 137* 246* 184* 159* 116*   Lipid Profile: No results for input(s): CHOL, HDL, LDLCALC, TRIG, CHOLHDL, LDLDIRECT in the last 72 hours. Thyroid Function Tests: No results for input(s): TSH, T4TOTAL, FREET4, T3FREE, THYROIDAB in the last 72 hours. Anemia Panel: No results for input(s): VITAMINB12, FOLATE, FERRITIN, TIBC, IRON, RETICCTPCT in the last 72 hours. Sepsis Labs: No results for input(s): PROCALCITON, LATICACIDVEN in the last 168 hours.  Recent Results (from the past 240 hour(s))  SARS Coronavirus 2 by RT PCR (hospital order, performed in [facility] hospital lab) Nasopharyngeal Nasopharyngeal Swab     Status: None   Collection Time: 02/12/20  1:55 AM    Specimen: Nasopharyngeal Swab  Result Value Ref Range Status   SARS Coronavirus 2 NEGATIVE NEGATIVE Final    Comment: (NOTE) SARS-CoV-2 target nucleic acids are NOT DETECTED.  The SARS-CoV-2 RNA is generally detectable in upper and lower respiratory specimens during the acute phase of infection. The lowest concentration of SARS-CoV-2 viral copies this assay can detect is 250 copies / mL. A negative result does not preclude SARS-CoV-2 infection and should not be used as the sole basis for treatment or other patient management decisions.  A negative result may occur with improper specimen collection / handling, submission of specimen other than nasopharyngeal swab, presence of viral mutation(s) within the areas targeted by this assay, and inadequate number of viral copies (<250 copies / mL). A negative result must be combined with clinical observations, patient history, and epidemiological information.  Fact Sheet for Patients:   StrictlyIdeas.no  Fact Sheet for Healthcare Providers: BankingDealers.co.za  This test is not yet approved or  cleared by the [place] FDA and has been authorized for detection and/or diagnosis of SARS-CoV-2 by FDA under an Emergency Use Authorization (EUA).  This EUA will remain in effect (meaning this test can be used) for the duration of the COVID-19 declaration under Section 564(b)(1) of the Act, 21 U.S.C. section 360bbb-3(b)(1), unless the authorization is terminated or revoked sooner.  Performed at [facility], [place] [address] [place], [place] [address]          Radiology Studies: No results found.      Scheduled Meds: . amLODipine  10 mg Oral Daily  . aspirin  81 mg Oral QHS  . atorvastatin  40 mg Oral Daily  . budesonide (PULMICORT) nebulizer solution  0.5 mg Nebulization BID  . carvedilol  25 mg Oral BID WC  . enoxaparin (LOVENOX) injection  30 mg Subcutaneous Q24H  .  ferrous sulfate  325 mg Oral Q breakfast  . insulin aspart  0-20 Units Subcutaneous TID WC  . insulin aspart  0-5 Units Subcutaneous QHS  . ipratropium-albuterol  3 mL Nebulization BID  . mirabegron ER  50 mg Oral Daily  . potassium chloride  20 mEq Oral Daily  . sildenafil  20 mg Oral TID  . sodium chloride flush  3 mL Intravenous Q12H  . torsemide  40 mg Oral Daily  And  . torsemide  20 mg Oral q1800   Continuous Infusions: . sodium chloride       LOS: 7 days    Time spent: 35 mins,More than 50% of that time was spent in counseling and/or coordination of care.      Shelly Coss, MD [facility] P8/08/2019, 8:12 AM

## 2020-02-19 NOTE — Progress Notes (Signed)
   02/19/20 2019  Assess: MEWS Score  Temp (!) 102.6 F (39.2 C)  BP (!) 98/41  Pulse Rate 62  Resp 20  SpO2 93 %  Assess: MEWS Score  MEWS Temp 2  MEWS Systolic 1  MEWS Pulse 0  MEWS RR 0  MEWS LOC 0  MEWS Score 3  MEWS Score Color Yellow  Assess: if the MEWS score is Yellow or Red  Were vital signs taken at a resting state? Yes  Focused Assessment Change from prior assessment (see assessment flowsheet)  Early Detection of Sepsis Score *See Row Information* Medium  MEWS guidelines implemented *See Row Information* Yes  Treat  MEWS Interventions Administered scheduled meds/treatments  Take Vital Signs  Increase Vital Sign Frequency  Yellow: Q 2hr X 2 then Q 4hr X 2, if remains yellow, continue Q 4hrs  Escalate  MEWS: Escalate Yellow: discuss with charge nurse/RN and consider discussing with provider and RRT  Notify: Provider  Provider Name/Title Dr Josph Macho  Date Provider Notified 02/19/20  Time Provider Notified 2100  Notification Type Page  Notification Reason Other (Comment) (fever)  Response See new orders  Date of Provider Response 02/19/20  Time of Provider Response 2103  Document  Progress note created (see row info) Yes  Blood culture done, antb started. Will monitor.

## 2020-02-19 NOTE — Progress Notes (Signed)
NAME:  [name], MRN:  [id number], DOB:  [birth year], LOS: 7 ADMISSION DATE:  02/11/2020, CONSULTATION DATE:  7/28 REFERRING MD:  Dr. [name], CHIEF COMPLAINT:  Hypoxia.    Brief History   59F with extensive cardiac and pulmonary history admitted with hypoxemia presumably secondary to CHF exacerbation, however, despite diuresis she remains hypoxemic. PCCM consulted.   History of present illness   79 year old female with PMH as below, which is significant for NICM s/p defibrillator, HFrEF (LVEF 45%), OSA on CPAP, CKD IV, COPD, Pulmonary hypertension, diet controlled DM, and HTN. She is followed by Dr. Halford Chessman in the pulmonary clinic. Her CPAP malfunctioned about 2 months PTA and she has been unable to use it. Has been wearing 2L O2 at night instead. She presented to [facility] ED on 7/25 with complaints of dyspnea and lower extremity edema. O2 sats 88% on room air which did not improve with increased lasix dosing at home x 2 days. Dyspnea was progressive prompting ED presentation.  In the ED she was felt to be volume overloaded and was treated with diuresis. Admitted to the hospitalists service with cardiology consulting. She was treated with supplemental O2 up to 15L, IV diuretics, bronchodilators, and steroids. CXR initially clear and she was worked up for PE with VQ scan, which was negative. On 7/28 despite diuresis she remained hypoxemic requiring 10 L O2 and her renal indices began to increase. PCCM consulted for ongoing hypoxemia.   Her breathing has significantly improved by her estimation since the time of her admission. Her lower extremity edema is much improved. She does still complain of dyspnea on exertion, but no longer of orthopnea. She has no cough and has not experienced subjective fevers.   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 rate 20, height _0  (1.575 m), weight 76.1 kg, SpO2 94 %.        Intake/Output Summary (Last 24 hours) at 02/19/2020 1017 Last data filed at 02/19/2020 0438 Gross per 24 hour  Intake 240 ml  Output 2800 ml  Net -2560 ml   Filed Weights   02/17/20 0256 02/18/20 0427 02/19/20 0436  Weight: 79.1 kg 78.1 kg 76.1 kg    Examination: General: Elderly appearing F, reclined in bed NAD  HENT: NCAT  Pink mmm Anicteric sclera Lungs: CTA bilaterally. Symmetrical chest expansion. No accessory muscle use  Cardiovascular: RRR s1s2. Brisk capillary refill  Abdomen: Soft round ndnt + bowel sounds  Extremities: No acute deformity. Trace BLE edmea  Neuro: AAO x 4  following commands PERRLA   Resolved Hospital Problem list     Assessment & Plan:   Acute on chronic hypoxic and hypercapnic respiratory failure - combination of acute on chronic combined CHF, sleep disordered breathing, COPD, WHO group 2 and 3 pulmonary hypertension, and acute on chronic cor pulmonale.  -Pts home CPAP broke, has been off x 3 months prior to  admission P -continue to diurese as she can tolerate, toresemide 54m qAM 257mqHS  -Wean O2 as able -QHS CPAP -Duonebs, pulmicort  -Continue sildenifil 2043mID -IS, mobility   Best practice:  Diet: as tolerated  Pain/Anxiety/Delirium protocol (if indicated): NA VAP protocol (if indicated): NA DVT prophylaxis: enoxaparin  GI prophylaxis: NA Glucose control: Per primary Mobility: Up with assist Code Status: FULL Family Communication: Patient updated  Disposition: Tele   Labs   CBC: Recent Labs  Lab 02/15/20 1713 02/15/20 1717 02/15/20 1818  WBC  --   --  6.5  HGB 10.2*  12.6 12.2 11.3*  HCT 30.0*  37.0 36.0 36.9  MCV  --   --  88.1  PLT  --   --  [id number] Basic Metabolic Panel: Recent Labs  Lab 02/15/20 0428 02/15/20 0428 02/15/20 1713 02/15/20 1717 02/15/20 1818 02/16/20 0110 02/17/20 0358 02/18/20 0629 02/19/20 0532  NA 136  --    < > 143  --  139 138 139 137  K 4.2  --    < > 3.9  --  4.2 4.4 4.3 4.4  CL 99  --   --   --   --  102 101 96* 93*  CO2 28  --   --   --   --  28 29 32 35*  GLUCOSE 310*  --   --   --   --  155* 114* 128* 113*  BUN 48*  --   --   --   --  49* 48* 47* 47*  CREATININE 2.17*   < >  --   --  2.04* 1.88* 1.76* 1.98* 1.96*  CALCIUM 8.5*  --   --   --   --  8.3* 8.5* 9.0 8.7*   < > = values in this interval not displayed.   GFR: Estimated Creatinine Clearance: 21.5 mL/min (A) (by C-G formula based on SCr of 1.96 mg/dL (H)). Recent Labs  Lab 02/15/20 1818  WBC 6.5    Liver Function Tests: No results for input(s): AST, ALT, ALKPHOS, BILITOT, PROT, ALBUMIN in the last 168 hours. No results for input(s): LIPASE, AMYLASE in the last 168 hours. No results for input(s): AMMONIA in the last 168 hours.  ABG    Component Value Date/Time   PHART 7.421 (H) 06/29/2007 0330   PCO2ART 51.6 (H) 06/29/2007 0330   PO2ART 75.4 (L) 06/29/2007 0330   HCO3 29.2 (H) 02/15/2020 1717   TCO2 31 02/15/2020 1717   O2SAT 61.0 02/15/2020 1717       Coagulation Profile: No results for input(s): INR, PROTIME in the last 168 hours.  Cardiac Enzymes: No results for input(s): CKTOTAL, CKMB, CKMBINDEX, TROPONINI in the last 168 hours.  HbA1C: Hemoglobin A1C  Date/Time Value Ref Range Status  05/07/2017 12:00 AM 6.8  Final   Hgb A1c MFr Bld  Date/Time Value Ref Range Status  02/15/2020 07:02 AM 6.5 (H) 4.8 - 5.6 % Final    Comment:    (NOTE) Pre diabetes:          5.7%-6.4%  Diabetes:              >  6.4%  Glycemic control for   <7.0% adults with diabetes   10/04/2019 02:44 PM 6.0 (H) 4.8 - 5.6 % Final    Comment:             Prediabetes: 5.7 - 6.4          Diabetes: >6.4          Glycemic control for adults with diabetes: <7.0     CBG: Recent Labs  Lab 02/18/20 0605 02/18/20 1114 02/18/20 1758 02/18/20 2143 02/19/20 0620  GLUCAP 137* 246* 184* [place] MSN, AGACNP-BC [place] [phone] If no answer, [phone] 02/19/2020, 10:18 AM

## 2020-02-19 NOTE — Progress Notes (Signed)
Physical Therapy Treatment Patient Details Name: [name] MRN: [id number] DOB: 03/10/1941 Today's Date: 02/19/2020    History of Present Illness 79 y.o. 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    PT Comments    Pt admitted with above diagnosis. Pt was able to ambulate with RW with O2 from 5L to 10 L with activity to matintain sats >85%.  Pt did progress distance but again reiterated to pt that it may be best to perform short bouts of walking with sitting rest breaks so that she can maintain O2 saturation.  Pt needs continued PT to work on endurance. Pt currently with functional limitations due to balance and endurance deficits. Pt will benefit from skilled PT to increase their independence and safety with mobility to allow discharge to the venue listed below.     Follow Up Recommendations  SNF;Supervision/Assistance - 24 hour     Equipment Recommendations  None recommended by PT    Recommendations for Other Services       Precautions / Restrictions Precautions Precautions: Fall Restrictions Weight Bearing Restrictions: No    Mobility  Bed Mobility               General bed mobility comments: Patient seated in recliner upon entry.   Transfers Overall transfer level: Needs assistance Equipment used: Rolling walker (2 wheeled) Transfers: Sit to/from Stand Sit to Stand: Min assist         General transfer comment: minA overall from recliner with cues for hand placement  Ambulation/Gait Ambulation/Gait assistance: Min guard Gait Distance (Feet): 350 Feet Assistive device: Rolling walker (2 wheeled) Gait  Pattern/deviations: Step-through pattern;Decreased stride length;Trunk flexed   Gait velocity interpretation: <1.31 ft/sec, indicative of household ambulator General Gait Details: Needs cues periodically to stay close to RW as well.  Pt on 5LO2 on arrival at 92%.  Pulse ox with activity 78%  therefore had to incr to 10L to keep sats 85% or better. Took 5 min for pt O2 to return to 92% on 5L.  Pt reminded that she may have to continue to take short walks with sitting rest breaks for breathing purposes. Oncr back, pt reports urinating in chair prior to placing purewick therefore assisted daughter with changing pts pad and cleaning her and NT came in to place purewick.    Stairs             Wheelchair Mobility    Modified Rankin (Stroke Patients Only)       Balance Overall balance assessment: Needs assistance Sitting-balance support: No upper extremity supported;Feet supported Sitting balance-Leahy Scale: Fair     Standing balance support: During functional activity;Single extremity supported Standing balance-Leahy Scale: Poor Standing balance comment: relies on UE support for balance                            Cognition Arousal/Alertness: Awake/alert Behavior During Therapy: WFL for tasks assessed/performed Overall Cognitive Status: Within Functional Limits for tasks assessed  Exercises Other Exercises Other Exercises: pursed lip breathing for tasks    General Comments        Pertinent Vitals/Pain Pain Assessment: No/denies pain    Home Living                      Prior Function            PT Goals (current goals can now be found in the care plan section) Acute Rehab PT Goals Patient Stated Goal: Patient requesting short-term SNF rehab.  Progress towards PT goals: Progressing toward goals    Frequency    Min 2X/week      PT Plan Current plan remains appropriate     Co-evaluation              AM-PAC PT "6 Clicks" Mobility   Outcome Measure  Help needed turning from your back to your side while in a flat bed without using bedrails?: None Help needed moving from lying on your back to sitting on the side of a flat bed without using bedrails?: None Help needed moving to and from a bed to a chair (including a wheelchair)?: A Little Help needed standing up from a chair using your arms (e.g., wheelchair or bedside chair)?: A Little Help needed to walk in hospital room?: A Little Help needed climbing 3-5 steps with a railing? : A Lot 6 Click Score: 19    End of Session Equipment Utilized During Treatment: Gait belt;Oxygen Activity Tolerance: Patient limited by fatigue Patient left: with call bell/phone within reach;with family/visitor present;in chair Nurse Communication: Mobility status PT Visit Diagnosis: Muscle weakness (generalized) (M62.81)     Time: 8921-1941 PT Time Calculation (min) (ACUTE ONLY): 23 min  Charges:  $Gait Training: 23-37 mins                     Kaliel Bolds W,PT [place] Pager:  [phone]  Office:  [place] 02/19/2020, 2:26 PM

## 2020-02-19 NOTE — TOC Progression Note (Signed)
Transition of Care [facility]) - Progression Note    Patient Details  Name: [name] MRN: [id number] Date of Birth: 1941/07/15  Transition of Care [facility]) CM/SW [place], [place] Phone Number: 02/19/2020, 5:08 PM  Clinical Narrative:    CSW met with patient and her daughter [name]. Family made a SNF choice of [place]. CSW started insurance authorization, reference [id number]. Patient will need covid at discharge.    Expected Discharge Plan: [place] Barriers to Discharge: Continued Medical Work up, Ship broker  Expected Discharge Plan and Services Expected Discharge Plan: [place] arrangements for the past 2 months: Single Family Home                                       Social Determinants of Health (SDOH) Interventions    Readmission Risk Interventions No flowsheet data found.

## 2020-02-20 LAB — URINALYSIS, ROUTINE W REFLEX MICROSCOPIC
Bilirubin Urine: NEGATIVE
Glucose, UA: NEGATIVE mg/dL
Hgb urine dipstick: NEGATIVE
Ketones, ur: NEGATIVE mg/dL
Leukocytes,Ua: NEGATIVE
Nitrite: NEGATIVE
Protein, ur: NEGATIVE mg/dL
Specific Gravity, Urine: 1.008 (ref 1.005–1.030)
pH: 5 (ref 5.0–8.0)

## 2020-02-20 LAB — BASIC METABOLIC PANEL
Anion gap: 8 (ref 5–15)
BUN: 48 mg/dL — ABNORMAL HIGH (ref 8–23)
CO2: 33 mmol/L — ABNORMAL HIGH (ref 22–32)
Calcium: 8.7 mg/dL — ABNORMAL LOW (ref 8.9–10.3)
Chloride: 94 mmol/L — ABNORMAL LOW (ref 98–111)
Creatinine, Ser: 2.19 mg/dL — ABNORMAL HIGH (ref 0.44–1.00)
GFR calc Af Amer: 24 mL/min — ABNORMAL LOW (ref >60)
GFR calc non Af Amer: 20 mL/min — ABNORMAL LOW (ref >60)
Glucose, Bld: 109 mg/dL — ABNORMAL HIGH (ref 70–99)
Potassium: 4.4 mmol/L (ref 3.5–5.1)
Sodium: 135 mmol/L (ref 135–145)

## 2020-02-20 LAB — CBC WITH DIFFERENTIAL/PLATELET
Abs Immature Granulocytes: 0.03 10*3/uL (ref 0.00–0.07)
Basophils Absolute: 0 10*3/uL (ref 0.0–0.1)
Basophils Relative: 0 %
Eosinophils Absolute: 0.2 10*3/uL (ref 0.0–0.5)
Eosinophils Relative: 2 %
HCT: 35.4 % — ABNORMAL LOW (ref 36.0–46.0)
Hemoglobin: 10.8 g/dL — ABNORMAL LOW (ref 12.0–15.0)
Immature Granulocytes: 1 %
Lymphocytes Relative: 14 %
Lymphs Abs: 0.9 10*3/uL (ref 0.7–4.0)
MCH: 26.8 pg (ref 26.0–34.0)
MCHC: 30.5 g/dL (ref 30.0–36.0)
MCV: 87.8 fL (ref 80.0–100.0)
Monocytes Absolute: 1 10*3/uL (ref 0.1–1.0)
Monocytes Relative: 15 %
Neutro Abs: 4.3 10*3/uL (ref 1.7–7.7)
Neutrophils Relative %: 68 %
Platelets: 198 10*3/uL (ref 150–400)
RBC: 4.03 MIL/uL (ref 3.87–5.11)
RDW: 15.2 % (ref 11.5–15.5)
WBC: 6.3 10*3/uL (ref 4.0–10.5)
nRBC: 0 % (ref 0.0–0.2)

## 2020-02-20 LAB — GLUCOSE, CAPILLARY
Glucose-Capillary: 100 mg/dL — ABNORMAL HIGH (ref 70–99)
Glucose-Capillary: 100 mg/dL — ABNORMAL HIGH (ref 70–99)
Glucose-Capillary: 212 mg/dL — ABNORMAL HIGH (ref 70–99)
Glucose-Capillary: 211 mg/dL — ABNORMAL HIGH (ref 70–99)
Glucose-Capillary: 177 mg/dL — ABNORMAL HIGH (ref 70–99)

## 2020-02-20 LAB — SARS CORONAVIRUS 2 BY RT PCR (HOSPITAL ORDER, PERFORMED IN ~~LOC~~ HOSPITAL LAB): SARS Coronavirus 2: NEGATIVE

## 2020-02-20 NOTE — TOC Progression Note (Signed)
Transition of Care [facility]) - Progression Note    Patient Details  Name: [name] MRN: [id number] Date of Birth: 1941/02/05  Transition of Care [facility]) CM/SW [place], [place] Phone Number: 02/20/2020, 3:43 PM  Clinical Narrative:    CSW received insurance auth for pt.  [employer] reference # [id number], [name] [id number].  Case Mgr Cristobal Goldmann, auth 8/3-8/5 Review date 8/5.  [place] for possible discharge on tomorrow.  Have requested covid through RN.  Physician is aware.     Expected Discharge Plan: [place] Barriers to Discharge: Continued Medical Work up, Ship broker  Expected Discharge Plan and Services Expected Discharge Plan: [place] arrangements for the past 2 months: Single Family Home                                       Social Determinants of Health (SDOH) Interventions    Readmission Risk Interventions No flowsheet data found.

## 2020-02-20 NOTE — Progress Notes (Addendum)
PROGRESS NOTE    [name]  IHK:[id number] DOB: May 27, 1941 DOA: 02/11/2020 PCP: Girtha Rm, NP-C   Brief Narrative:  Patient is 79 year old female with history of chronic combined systolic/diastolic CHF, CKD stage IV, hypertension, OSA on CPAP, diabetes type 2 who was brought to the emergency room with complaints of shortness of breath, bilateral lower extremity swelling, weight gain.  Patient was found to have acute on chronic CHF exacerbation .  Cardiology consulted and following.  Underwent  right heart cath on 02/14/30.  On 5 L of oxygen per minute today.  Overall status has significantly improved.  Ultimate plan is to discharge to skilled nursing facility..  Assessment & Plan:   Principal Problem:   Acute respiratory failure with hypoxia (HCC) Active Problems:   Essential hypertension   COPD (chronic obstructive pulmonary disease) (HCC)   OSA on CPAP   CKD (chronic kidney disease) stage 4, GFR 15-29 ml/min (HCC)   Coronary artery disease involving native coronary artery of native heart without angina pectoris   Acute on chronic combined systolic and diastolic CHF (congestive heart failure) (HCC)   CHF exacerbation (HCC)   Pulmonary hypertension, unspecified (HCC)   Acute on chronic hypoxic respiratory failure: Multifactorial.  Underlying CHF, severe pulmonary hypertension,COPD and also possible restrictive lung disease .  This morning she was on high flow nasal cannula at 5L/min.  Continue to monitor respiratory status and keep her on usual nasal cannula.  She is on oxygen at night at home at 2L/min.  High-resolution CT scan did not show any fibrotic lung disease  Acute on chronic systolic congestive heart failure: Presented with shortness of breath, lower extremity swelling. echocardiogram done here showed ejection fraction of 45 to 50%, global hypokinesis, severely reduced right ventricular systolic function, elevated pulmonary artery systolic pressure.  Findings  consistent with cor pulmonale.  Cardiology following.    Continue to monitor weight, input/output.  Underwent right heart catheterization which showed severe pulmonary hypertension .patient also has ICD for complete heart block Torsemide held due to AKI.  Pulmonary artery hypertension:Likely secondary to  chronic restrictive lung disease, obstructive sleep apnea.  Right heart catheterization done which showed severe pulmonary artery hypertension.  Continue on  sildenafil  Chronic respiratory insufficiency/restrictive lung disease: PFTs had shown significant restrictive lung disease with marked reduced DLCO.  Also has history of OSA on CPAP.  She says she is compliant with that.  Follows with pulmonology Dr. Halford Chessman. VQ scan done here did not show any pulmonary embolism.  Pulmonology following.  Paroxysmal atrial tachycardia: Cardiology following.  Continue current management  Hypertension: Monitor blood pressure.  Continue current regimen.  Coronary artery disease: Previous cardiac cath showed minor nonobstructive disease.  On statin, aspirin, beta-blocker.  CKD stage IIIb: Baseline creatinine ranges from 1.6-1.9.  We will continue to monitor.  Creatinine slightly bumped up today.  Will check BMP tomorrow.  Fever: Had a fever of 102 F  night overnight, Janett Billow showed mild, diffuse chronic appearing increased lung markings with mild left basilar atelectasis and/or infiltrate. Suspicion for pneumonia.  Started on antibiotics. currently afebrile.  Diabetes type 2: Recent hemoglobin A1c of 6.  Currently diet-controlled only.  Currently hyperglycemic most likely secondary to steroids.  Continue current regimen  Debility/deconditioning: Patient seen by physical therapy and occupational therapy and recommended skilled nursing facility on discharge.  Social worker following.               DVT prophylaxis: Lovenox Code Status: Full Family Communication: daughter on phone  on 02/2120 Status is:  Inpatient  Remains inpatient appropriate because:unsafe DC plan  Dispo: The patient is from: Home              Anticipated d/c is to: SNF vs CIR              Anticipated d/c date is: tomorrow        Needs pulmonary clearance before discharge.   Consultants: Cardiology,PCCM  Procedures:None  Antimicrobials:  Anti-infectives (From admission, onward)   Start     Dose/Rate Route Frequency Ordered Stop   02/19/20 2315  cefTRIAXone (ROCEPHIN) 2 g in sodium chloride 0.9 % 100 mL IVPB     Discontinue     2 g 200 mL/hr over 30 Minutes Intravenous Every 24 hours 02/19/20 2226 02/24/20 2314   02/19/20 2315  azithromycin (ZITHROMAX) 500 mg in sodium chloride 0.9 % 250 mL IVPB     Discontinue     500 mg 250 mL/hr over 60 Minutes Intravenous Every 24 hours 02/19/20 2226 02/24/20 2314      Subjective: Patient seen and examined the bedside today.  Hemodynamically stable.  Had a fever of 102 F last night.  Currently on 3 -5 L of oxygen per minute.  Denies any cough or new shortness of breath.   Objective: Vitals:   02/19/20 2019 02/19/20 2200 02/20/20 0042 02/20/20 0503  BP: (!) 98/41 (!) 94/50 (!) 96/52 119/63  Pulse: 62  60 60  Resp: _0 Temp: (!) 102.6 F (39.2 C) (!) 102.1 F (38.9 C) 97.6 F (36.4 C) 98.4 F (36.9 C)  TempSrc: Oral Oral Oral Oral  SpO2: 93% 95%  98%  Weight:    75.8 kg  Height:        Intake/Output Summary (Last 24 hours) at 02/20/2020 0809 Last data filed at 02/20/2020 0600 Gross per 24 hour  Intake 480 ml  Output 1500 ml  Net -1020 ml   Filed Weights   02/18/20 0427 02/19/20 0436 02/20/20 0503  Weight: 78.1 kg 76.1 kg 75.8 kg    Examination:  General exam: Very pleasant elderly female, comfortable, lying in the bed HEENT:PERRL,Oral mucosa moist, Ear/Nose normal on gross exam Respiratory system: Bilateral basal crackles  cardiovascular system: S1 & S2 heard, RRR. No JVD, murmurs, rubs, gallops or clicks. Gastrointestinal system: Abdomen is  nondistended, soft and nontender. No organomegaly or masses felt. Normal bowel sounds heard. Central nervous system: Alert and oriented. No focal neurological deficits. Extremities: No edema, no clubbing ,no cyanosis, distal peripheral pulses palpable. Skin: No rashes, lesions or ulcers,no icterus ,no pallor    Data Reviewed: I have personally reviewed following labs and imaging studies  CBC: Recent Labs  Lab 02/15/20 1713 02/15/20 1717 02/15/20 1818 02/20/20 0510  WBC  --   --  6.5 6.3  NEUTROABS  --   --   --  4.3  HGB 10.2*  12.6 12.2 11.3* 10.8*  HCT 30.0*  37.0 36.0 36.9 35.4*  MCV  --   --  88.1 87.8  PLT  --   --  190 320   Basic Metabolic Panel: Recent Labs  Lab 02/16/20 0110 02/17/20 0358 02/18/20 0629 02/19/20 0532 02/20/20 0510  NA 139 138 139 137 135  K 4.2 4.4 4.3 4.4 4.4  CL 102 101 96* 93* 94*  CO2 28 29 32 35* 33*  GLUCOSE 155* 114* 128* 113* 109*  BUN 49* 48* 47* 47* 48*  CREATININE 1.88* 1.76* 1.98* 1.96* 2.19*  CALCIUM 8.3* 8.5* 9.0 8.7* 8.7*   GFR: Estimated Creatinine Clearance: 19.2 mL/min (A) (by C-G formula based on SCr of 2.19 mg/dL (H)). Liver Function Tests: No results for input(s): AST, ALT, ALKPHOS, BILITOT, PROT, ALBUMIN in the last 168 hours. No results for input(s): LIPASE, AMYLASE in the last 168 hours. No results for input(s): AMMONIA in the last 168 hours. Coagulation Profile: No results for input(s): INR, PROTIME in the last 168 hours. Cardiac Enzymes: No results for input(s): CKTOTAL, CKMB, CKMBINDEX, TROPONINI in the last 168 hours. BNP (last 3 results) No results for input(s): PROBNP in the last 8760 hours. HbA1C: No results for input(s): HGBA1C in the last 72 hours. CBG: Recent Labs  Lab 02/19/20 0620 02/19/20 1150 02/19/20 1623 02/19/20 2123 02/20/20 0624  GLUCAP 116* 229* 185* 206* 100*   Lipid Profile: No results for input(s): CHOL, HDL, LDLCALC, TRIG, CHOLHDL, LDLDIRECT in the last 72 hours. Thyroid  Function Tests: No results for input(s): TSH, T4TOTAL, FREET4, T3FREE, THYROIDAB in the last 72 hours. Anemia Panel: No results for input(s): VITAMINB12, FOLATE, FERRITIN, TIBC, IRON, RETICCTPCT in the last 72 hours. Sepsis Labs: No results for input(s): PROCALCITON, LATICACIDVEN in the last 168 hours.  Recent Results (from the past 240 hour(s))  SARS Coronavirus 2 by RT PCR (hospital order, performed in [facility] hospital lab) Nasopharyngeal Nasopharyngeal Swab     Status: None   Collection Time: 02/12/20  1:55 AM   Specimen: Nasopharyngeal Swab  Result Value Ref Range Status   SARS Coronavirus 2 NEGATIVE NEGATIVE Final    Comment: (NOTE) SARS-CoV-2 target nucleic acids are NOT DETECTED.  The SARS-CoV-2 RNA is generally detectable in upper and lower respiratory specimens during the acute phase of infection. The lowest concentration of SARS-CoV-2 viral copies this assay can detect is 250 copies / mL. A negative result does not preclude SARS-CoV-2 infection and should not be used as the sole basis for treatment or other patient management decisions.  A negative result may occur with improper specimen collection / handling, submission of specimen other than nasopharyngeal swab, presence of viral mutation(s) within the areas targeted by this assay, and inadequate number of viral copies (<250 copies / mL). A negative result must be combined with clinical observations, patient history, and epidemiological information.  Fact Sheet for Patients:   StrictlyIdeas.no  Fact Sheet for Healthcare Providers: BankingDealers.co.za  This test is not yet approved or  cleared by the [place] FDA and has been authorized for detection and/or diagnosis of SARS-CoV-2 by FDA under an Emergency Use Authorization (EUA).  This EUA will remain in effect (meaning this test can be used) for the duration of the COVID-19 declaration under Section 564(b)(1)  of the Act, 21 U.S.C. section 360bbb-3(b)(1), unless the authorization is terminated or revoked sooner.  Performed at [facility], [place] [address] [place], [place] [address]          Radiology Studies: DG CHEST PORT 1 VIEW  Result Date: 02/19/2020 CLINICAL DATA:  Shortness of breath. EXAM: PORTABLE CHEST 1 VIEW COMPARISON:  February 14, 2020 FINDINGS: There is a dual lead AICD. Mild, diffuse chronic appearing increased lung markings are seen. Mild atelectasis and/or infiltrate is seen within the left lung base. There is no evidence of a pleural effusion or pneumothorax. The cardiac silhouette is markedly enlarged. Multilevel degenerative changes seen throughout the thoracic spine. IMPRESSION: Mild, diffuse chronic appearing increased lung markings with mild left basilar atelectasis and/or infiltrate. Electronically Signed   By: Hoover Browns  [place] M.D.   On: 02/19/2020 21:42        Scheduled Meds: . amLODipine  10 mg Oral Daily  . aspirin  81 mg Oral QHS  . atorvastatin  40 mg Oral Daily  . budesonide (PULMICORT) nebulizer solution  0.5 mg Nebulization BID  . carvedilol  25 mg Oral BID WC  . enoxaparin (LOVENOX) injection  30 mg Subcutaneous Q24H  . ferrous sulfate  325 mg Oral Q breakfast  . insulin aspart  0-20 Units Subcutaneous TID WC  . insulin aspart  0-5 Units Subcutaneous QHS  . ipratropium-albuterol  3 mL Nebulization BID  . mirabegron ER  50 mg Oral Daily  . potassium chloride  20 mEq Oral Daily  . sildenafil  20 mg Oral TID  . sodium chloride flush  3 mL Intravenous Q12H  . torsemide  40 mg Oral Daily   And  . torsemide  20 mg Oral q1800   Continuous Infusions: . sodium chloride    . azithromycin 500 mg (02/19/20 2314)  . cefTRIAXone (ROCEPHIN)  IV 2 g (02/20/20 0041)     LOS: 8 days    Time spent: 35 mins,More than 50% of that time was spent in counseling and/or coordination of care.      Shelly Coss, MD [facility] P8/09/2019, 8:09 AM

## 2020-02-20 NOTE — Progress Notes (Signed)
NAME:  [name], MRN:  [id number], DOB:  1940-10-01, LOS: 8 ADMISSION DATE:  02/11/2020, CONSULTATION DATE:  7/28 REFERRING MD:  Dr. [name], CHIEF COMPLAINT:  Hypoxia.    Brief History   41F with extensive cardiac and pulmonary history admitted with hypoxemia presumably secondary to CHF exacerbation, however, despite diuresis she remains hypoxemic. PCCM consulted.   History of present illness   79 year old female with PMH as below, which is significant for NICM s/p defibrillator, HFrEF (LVEF 45%), OSA on CPAP, CKD IV, COPD, Pulmonary hypertension, diet controlled DM, and HTN. She is followed by Dr. Halford Chessman in the pulmonary clinic. Her CPAP malfunctioned about 2 months PTA and she has been unable to use it. Has been wearing 2L O2 at night instead. She presented to [facility] ED on 7/25 with complaints of dyspnea and lower extremity edema. O2 sats 88% on room air which did not improve with increased lasix dosing at home x 2 days. Dyspnea was progressive prompting ED presentation.  In the ED she was felt to be volume overloaded and was treated with diuresis. Admitted to the hospitalists service with cardiology consulting. She was treated with supplemental O2 up to 15L, IV diuretics, bronchodilators, and steroids. CXR initially clear and she was worked up for PE with VQ scan, which was negative. On 7/28 despite diuresis she remained hypoxemic requiring 10 L O2 and her renal indices began to increase. PCCM consulted for ongoing hypoxemia.   Her breathing has significantly improved by her estimation since the time of her admission. Her lower extremity edema is much improved. She does still complain of dyspnea on exertion, but no longer of orthopnea. She has no cough and has not experienced subjective fevers.   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  Significant Hospital Events   8/2 O2 weaned from 7 to 5L  Consults:  Cardiology Pulmonary  Procedures:    Significant Diagnostic Tests:  VQ 7/26 > negative for PE  Micro Data:    Antimicrobials:     Interim history/subjective:  No acute distress at rest function has been weaned down to 5 L with O2 saturations 96% would allow decrease in FiO2 even more  Objective   Blood pressure (!) 93/52, pulse 60, temperature 98.5 F (36.9 C), temperature source Oral, resp. rate 20, height _0  (1.575 m), weight 75.8 kg, SpO2 96 %.        Intake/Output Summary (Last 24 hours) at 02/20/2020 0958 Last data filed at 02/20/2020 0600 Gross per 24 hour  Intake 240 ml  Output 1500 ml  Net -1260 ml   Filed Weights   02/18/20 0427 02/19/20 0436 02/20/20 0503  Weight: 78.1 kg 76.1 kg 75.8 kg    Examination: General: Awake alert no acute distress 79 year old female HEENT: No lymphadenopathy or JVD is appreciated  Neuro: Grossly intact CV: Heart sounds are distant PULM: Diminished in the legs is otherwise clear  GI: soft, bsx4 active  Extremities: warm/dry,  edema  Skin: no rashes or lesions   Resolved Hospital  Problem list     Assessment & Plan:   Acute on chronic hypoxic and hypercapnic respiratory failure - combination of acute on chronic combined CHF, sleep disordered breathing, COPD, WHO group 2 and 3 pulmonary hypertension, and acute on chronic cor pulmonale.  -Pts home CPAP broke, has been off x 3 months prior to admission  Intake/Output Summary (Last 24 hours) at  02/20/2020 1000 Last data filed at 02/20/2020 0600 Gross per 24 hour  Intake 240 ml  Output 1500 ml  Net -1260 ml   Lab Results  Component Value Date   CREATININE 2.19 (H) 02/20/2020   CREATININE 1.96 (H) 02/19/2020   CREATININE 1.98 (H) 02/18/2020     P diuresis as tolerated note creatinine is climbing O2 is now down to 5 L continue CPAP at night.  Reportedly Dr. Lillard Anes ordered her a new machine this needs to be verified.   Continue sildenifil continue bronchodilators mobilize as able pulmonary critical care available as needed.  Best practice:  Diet: Heart health low na Pain/Anxiety/Delirium protocol (if indicated): NA VAP protocol (if indicated): NA DVT prophylaxis: enoxaparin  GI prophylaxis: NA Glucose control: Per primary Mobility: Up with assist Code Status: FULL Family Communication: Patient updated  Disposition: Tele   Labs   CBC: Recent Labs  Lab 02/15/20 1713 02/15/20 1717 02/15/20 1818 02/20/20 0510  WBC  --   --  6.5 6.3  NEUTROABS  --   --   --  4.3  HGB 10.2*  12.6 12.2 11.3* 10.8*  HCT 30.0*  37.0 36.0 36.9 35.4*  MCV  --   --  88.1 87.8  PLT  --   --  190 751    Basic Metabolic Panel: Recent Labs  Lab 02/16/20 0110 02/17/20 0358 02/18/20 0629 02/19/20 0532 02/20/20 0510  NA 139 138 139 137 135  K 4.2 4.4 4.3 4.4 4.4  CL 102 101 96* 93* 94*  CO2 28 29 32 35* 33*  GLUCOSE 155* 114* 128* 113* 109*  BUN 49* 48* 47* 47* 48*  CREATININE 1.88* 1.76* 1.98* 1.96* 2.19*  CALCIUM 8.3* 8.5* 9.0 8.7* 8.7*   GFR: Estimated Creatinine Clearance: 19.2 mL/min (A) (by C-G formula based on SCr of 2.19 mg/dL (H)). Recent Labs  Lab 02/15/20 1818 02/20/20 0510  WBC 6.5 6.3    Liver Function Tests: No results for input(s): AST, ALT, ALKPHOS, BILITOT, PROT, ALBUMIN in the last 168 hours. No results for input(s): LIPASE, AMYLASE in the last 168 hours. No results for input(s): AMMONIA in the last 168 hours.  ABG    Component Value Date/Time     PHART 7.421 (H) 06/29/2007 0330   PCO2ART 51.6 (H) 06/29/2007 0330   PO2ART 75.4 (L) 06/29/2007 0330   HCO3 29.2 (H) 02/15/2020 1717   TCO2 31 02/15/2020 1717   O2SAT 61.0 02/15/2020 1717     Coagulation Profile: No results for input(s): INR, PROTIME in the last 168 hours.  Cardiac Enzymes: No results for input(s): CKTOTAL, CKMB, CKMBINDEX, TROPONINI in the last 168 hours.  HbA1C: Hemoglobin A1C  Date/Time Value Ref Range Status  05/07/2017 12:00 AM 6.8  Final   Hgb A1c MFr Bld  Date/Time Value Ref Range Status  02/15/2020 07:02 AM 6.5 (H) 4.8 - 5.6 % Final    Comment:    (NOTE) Pre diabetes:          5.7%-6.4%  Diabetes:              >6.4%  Glycemic control for   <  7.0% adults with diabetes   10/04/2019 02:44 PM 6.0 (H) 4.8 - 5.6 % Final    Comment:             Prediabetes: 5.7 - 6.4          Diabetes: >6.4          Glycemic control for adults with diabetes: <7.0     CBG: Recent Labs  Lab 02/19/20 0620 02/19/20 1150 02/19/20 1623 02/19/20 2123 02/20/20 0624  GLUCAP 116* 229* 185* 206* 100*     Steve Chenise Mulvihill ACNP Acute Care Nurse Practitioner [place] Please consult Amion 02/20/2020, 9:58 AM\

## 2020-02-21 LAB — GLUCOSE, CAPILLARY
Glucose-Capillary: 87 mg/dL (ref 70–99)
Glucose-Capillary: 191 mg/dL — ABNORMAL HIGH (ref 70–99)
Glucose-Capillary: 187 mg/dL — ABNORMAL HIGH (ref 70–99)
Glucose-Capillary: 189 mg/dL — ABNORMAL HIGH (ref 70–99)

## 2020-02-21 LAB — BASIC METABOLIC PANEL
Anion gap: 9 (ref 5–15)
BUN: 50 mg/dL — ABNORMAL HIGH (ref 8–23)
CO2: 33 mmol/L — ABNORMAL HIGH (ref 22–32)
Calcium: 8.5 mg/dL — ABNORMAL LOW (ref 8.9–10.3)
Chloride: 94 mmol/L — ABNORMAL LOW (ref 98–111)
Creatinine, Ser: 2.07 mg/dL — ABNORMAL HIGH (ref 0.44–1.00)
GFR calc Af Amer: 25 mL/min — ABNORMAL LOW (ref >60)
GFR calc non Af Amer: 22 mL/min — ABNORMAL LOW (ref >60)
Glucose, Bld: 92 mg/dL (ref 70–99)
Potassium: 4.4 mmol/L (ref 3.5–5.1)
Sodium: 136 mmol/L (ref 135–145)

## 2020-02-21 LAB — URINE CULTURE: Culture: <10000 — AB

## 2020-02-21 LAB — STREP PNEUMONIAE URINARY ANTIGEN: Strep Pneumo Urinary Antigen: NEGATIVE

## 2020-02-21 LAB — BRAIN NATRIURETIC PEPTIDE: B Natriuretic Peptide: 364.7 pg/mL — ABNORMAL HIGH (ref 0.0–100.0)

## 2020-02-21 LAB — LEGIONELLA PNEUMOPHILA SEROGP 1 UR AG: L. pneumophila Serogp 1 Ur Ag: NEGATIVE

## 2020-02-21 MED ORDER — SILDENAFIL CITRATE 20 MG PO TABS: 20.0000 mg | ORAL_TABLET | Freq: Three times a day (TID) | ORAL | 0 refills | Status: DC

## 2020-02-21 MED ORDER — TORSEMIDE 20 MG PO TABS
40.0000 mg | ORAL_TABLET | Freq: Every day | ORAL | Status: DC
  Administered 2020-02-21 – 2020-02-22 (×2): 40 mg via ORAL
  Filled 2020-02-21 (×2): qty 2

## 2020-02-21 MED ORDER — POLYETHYLENE GLYCOL 3350 17 G PO PACK: 17.0000 g | PACK | Freq: Every day | ORAL | 0 refills | Status: DC | PRN

## 2020-02-21 MED ORDER — CEFDINIR 300 MG PO CAPS
300.0000 mg | ORAL_CAPSULE | Freq: Two times a day (BID) | ORAL | 0 refills | Status: AC
Start: 2020-02-21 — End: 2020-02-24

## 2020-02-21 MED ORDER — TORSEMIDE 20 MG PO TABS: 40.0000 mg | ORAL_TABLET | Freq: Every day | ORAL | Status: DC

## 2020-02-21 MED ORDER — ALBUTEROL SULFATE HFA 108 (90 BASE) MCG/ACT IN AERS
2.0000 | INHALATION_SPRAY | Freq: Four times a day (QID) | RESPIRATORY_TRACT | Status: DC | PRN
Start: 2020-02-21 — End: 2020-03-27

## 2020-02-21 MED ORDER — BUDESONIDE 0.5 MG/2ML IN SUSP: 0.5000 mg | Freq: Two times a day (BID) | RESPIRATORY_TRACT | 12 refills | Status: DC

## 2020-02-21 MED ORDER — POTASSIUM CHLORIDE CRYS ER 20 MEQ PO TBCR: 20.0000 meq | EXTENDED_RELEASE_TABLET | Freq: Every day | ORAL | Status: DC

## 2020-02-21 MED ORDER — IPRATROPIUM-ALBUTEROL 0.5-2.5 (3) MG/3ML IN SOLN: 3.0000 mL | Freq: Four times a day (QID) | RESPIRATORY_TRACT | Status: DC | PRN

## 2020-02-21 NOTE — Progress Notes (Signed)
RT placed patient on CPAP HS. 3L O2 bleed in needed. Patient tolerating well at this time.

## 2020-02-21 NOTE — TOC Progression Note (Signed)
Transition of Care [facility]) - Progression Note    Patient Details  Name: [name] MRN: [id number] Date of Birth: [birth year]  [facility] CM/SW [place], [place] Phone Number: 02/21/2020, 2:04 PM  Clinical Narrative:     CSW spoke with patient's daughter,Sharon regarding broken Cpap at home. [name] states patient pulmonary doctor is aware of broken Cpap -patient  is scheduled to have sleep study on 03/13/2020.  Thurmond Butts, MSW, [employer] Clinical Social Worker   Expected Discharge Plan: Skilled Nursing Facility Barriers to Discharge: Continued Medical Work up, Ship broker  Expected Discharge Plan and Services Expected Discharge Plan: [place]       Living arrangements for the past 2 months: Single Family Home Expected Discharge Date: 02/21/20                                     Social Determinants of Health (SDOH) Interventions    Readmission Risk Interventions No flowsheet data found.

## 2020-02-21 NOTE — Progress Notes (Signed)
NAME:  [name], MRN:  [id number], DOB:  1941/03/25, LOS: 9 ADMISSION DATE:  02/11/2020, CONSULTATION DATE:  7/28 REFERRING MD:  Dr. [name], CHIEF COMPLAINT:  Hypoxia.    Brief History   54F with extensive cardiac and pulmonary history admitted with hypoxemia presumably secondary to CHF exacerbation, however, despite diuresis she remains hypoxemic. PCCM consulted.   History of present illness   79 year old female with PMH as below, which is significant for NICM s/p defibrillator, HFrEF (LVEF 45%), OSA on CPAP, CKD IV, COPD, Pulmonary hypertension, diet controlled DM, and HTN. She is followed by Dr. Halford Chessman in the pulmonary clinic. Her CPAP malfunctioned about 2 months PTA and she has been unable to use it. Has been wearing 2L O2 at night instead. She presented to [facility] ED on 7/25 with complaints of dyspnea and lower extremity edema. O2 sats 88% on room air which did not improve with increased lasix dosing at home x 2 days. Dyspnea was progressive prompting ED presentation.  In the ED she was felt to be volume overloaded and was treated with diuresis. Admitted to the hospitalists service with cardiology consulting. She was treated with supplemental O2 up to 15L, IV diuretics, bronchodilators, and steroids. CXR initially clear and she was worked up for PE with VQ scan, which was negative. On 7/28 despite diuresis she remained hypoxemic requiring 10 L O2 and her renal indices began to increase. PCCM consulted for ongoing hypoxemia.   Her breathing has significantly improved by her estimation since the time of her admission. Her lower extremity edema is much improved. She does still complain of dyspnea on exertion, but no longer of orthopnea. She has no cough and has not experienced subjective fevers.   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  Significant Hospital Events   8/2 O2 weaned from 7 to 5L  Consults:  Cardiology Pulmonary  Procedures:  02/15/2020 demonstrates pulmonary artery hypertension with normal left-sided pressures.  Significant Diagnostic Tests:  VQ 7/26 > negative for PE  Micro Data:    Antimicrobials:  02/19/2020 Zithromax 02/19/2020 Rocephin  Interim history/subjective:  Continues to improve.  No acute distress at rest.  Reaching maximal hospital benefit.  Objective   Blood pressure (!) 104/41, pulse 60, temperature 98.6 F (37 C), temperature source Oral, resp. rate 16, height _0  (1.575 m), weight 75.8 kg, SpO2 100 %.        Intake/Output Summary (Last 24 hours) at 02/21/2020 0856 Last data filed at 02/21/2020 0840 Gross per 24 hour  Intake 1788.39 ml  Output 1100 ml  Net 688.39 ml   Filed Weights   02/19/20 0436 02/20/20 0503 02/21/20 0514  Weight: 76.1 kg 75.8 kg 75.8 kg    Examination: General: Well-nourished well-developed elderly female sitting in a chair no acute distress HEENT: MM pink/moist no JVD or lymphadenopathy is appreciated Neuro: Grossly intact without focal defect CV: Heart sounds are distant but regular PULM: Diminished in the bases GI: soft, bsx4 active  GU: Voids Extremities: warm/dry, negative edema  Skin:  no rashes or lesions   Resolved Hospital Problem list     Assessment & Plan:   Acute on chronic hypoxic and hypercapnic respiratory failure - combination of acute on chronic combined CHF, sleep disordered breathing, COPD, WHO group 2 and 3 pulmonary  hypertension, and acute on chronic cor pulmonale.  -Pts home CPAP broke, has been off x 3 months prior to admission  Intake/Output Summary (Last 24 hours) at 02/21/2020 0856 Last data filed at 02/21/2020 0840 Gross per 24 hour  Intake 1788.39 ml  Output 1100 ml  Net 688.39 ml   Lab Results  Component Value Date   CREATININE 2.19 (H) 02/20/2020   CREATININE 1.96 (H) 02/19/2020   CREATININE 1.98 (H) 02/18/2020     P Diuresis as tolerated Weaning FiO2 down to baseline Continues to improve CPAP Needs new CPAP as an outpatient Follow-up with Dr. Halford Chessman outpatient Transition to oral antibiotics Continue [place] Antihypertensives continue Pulmonary critical care available as needed  Best practice:  Diet: Heart health low na Pain/Anxiety/Delirium protocol (if indicated): NA VAP protocol (if indicated): NA DVT prophylaxis: enoxaparin  GI prophylaxis: NA Glucose control: Per primary Mobility: Up with assist Code Status: FULL Family Communication: Patient updated 02/21/2020 Disposition: Tele   Labs   CBC: Recent Labs  Lab 02/15/20 1713 02/15/20 1717 02/15/20 1818 02/20/20 0510  WBC  --   --  6.5 6.3  NEUTROABS  --   --   --  4.3  HGB 10.2*   12.6 12.2 11.3* 10.8*  HCT 30.0*   37.0 36.0 36.9 35.4*  MCV  --   --  88.1 87.8  PLT  --   --  190 161    Basic Metabolic Panel: Recent Labs  Lab 02/16/20 0110 02/17/20 0358 02/18/20 0629 02/19/20 0532 02/20/20 0510  NA 139 138 139 137 135  K 4.2 4.4 4.3 4.4 4.4  CL 102 101 96* 93* 94*  CO2 28 29 32 35* 33*  GLUCOSE 155* 114* 128* 113* 109*  BUN 49* 48* 47* 47* 48*  CREATININE 1.88* 1.76* 1.98* 1.96* 2.19*  CALCIUM 8.3* 8.5* 9.0 8.7* 8.7*   GFR: Estimated Creatinine Clearance: 19.2 mL/min (A) (by C-G formula based on SCr of 2.19 mg/dL (H)). Recent Labs  Lab 02/15/20 1818 02/20/20 0510  WBC 6.5 6.3    Liver Function Tests: No results for input(s): AST, ALT, ALKPHOS, BILITOT, PROT, ALBUMIN in the last 168  hours. No results for input(s): LIPASE, AMYLASE in the last 168 hours. No results for input(s): AMMONIA in the last 168 hours.  ABG    Component Value Date/Time   PHART 7.421 (H) 06/29/2007 0330   PCO2ART 51.6 (H) 06/29/2007 0330   PO2ART 75.4 (L) 06/29/2007 0330   HCO3 29.2 (H) 02/15/2020 1717   TCO2 31 02/15/2020 1717   O2SAT 61.0 02/15/2020 1717     Coagulation Profile: No results for input(s): INR, PROTIME in the last 168 hours.  Cardiac Enzymes: No results for input(s): CKTOTAL, CKMB, CKMBINDEX, TROPONINI in the last 168 hours.  HbA1C: Hemoglobin A1C  Date/Time Value Ref Range Status  05/07/2017 12:00 AM 6.8  Final   Hgb A1c MFr Bld  Date/Time Value Ref Range Status  02/15/2020 07:02 AM 6.5 (H) 4.8 - 5.6 % Final    Comment:    (NOTE) Pre diabetes:          5.7%-6.4%  Diabetes:              >6.4%  Glycemic control for   <  7.0% adults with diabetes   10/04/2019 02:44 PM 6.0 (H) 4.8 - 5.6 % Final    Comment:             Prediabetes: 5.7 - 6.4          Diabetes: >6.4          Glycemic control for adults with diabetes: <7.0     CBG: Recent Labs  Lab 02/20/20 0624 02/20/20 1135 02/20/20 1558 02/20/20 2110 02/21/20 0623  GLUCAP 100*   100* 212* 211* 177* 62     Steve Kerryann Allaire ACNP Acute Care Nurse Practitioner [address] Please consult Amion 02/21/2020, 8:56 AM\

## 2020-02-21 NOTE — Plan of Care (Signed)

## 2020-02-21 NOTE — TOC Progression Note (Signed)
Transition of Care [facility]) - Progression Note    Patient Details  Name: [name] MRN: [id number] Date of Birth: Nov 28, 1940  Transition of [facility] CM/SW [place], [place] Phone Number: 02/21/2020, 1:38 PM  Clinical Narrative:     CSW spoke to pts daughter [name] by phone about pts d/c. Sharron inquired about Cpap machine. Gatha Mayer stated that pt has one at home but is broken and not able to be used.   CSW called [facility], they stated that they are able to order it and they will have it tomorrow. CSW updated Gatha Mayer, Nurse and MD for likely tomorrow discharge due to [facility] not having Cpap.   Expected Discharge Plan: [place] Barriers to Discharge: Continued Medical Work up, Ship broker  Expected Discharge Plan and Services Expected Discharge Plan: [place] arrangements for the past 2 months: Single Family Home Expected Discharge Date: 02/21/20                                     Social Determinants of Health (SDOH) Interventions    Readmission Risk Interventions No flowsheet data found.  Emeterio Reeve, Latanya Presser, [place] Social Worker [phone]

## 2020-02-21 NOTE — Discharge Summary (Addendum)
Physician Discharge Summary  [name] NLG:[id number] DOB: [birth year] DOA: 02/11/2020  PCP: Girtha Rm, NP-C  Admit date: 02/11/2020 Discharge date: 02/22/20 Admitted From: Home Disposition:  SNF  Discharge Condition:Stable CODE STATUS:FULL Diet recommendation: Heart Healthy  Brief/Interim Summary:  Patient is 79 year old female with history of chronic combined systolic/diastolic CHF, CKD stage IV, hypertension, OSA on CPAP, diabetes type 2 who was brought to the emergency room with complaints of shortness of breath, bilateral lower extremity swelling, weight gain.  Patient was found to have acute on chronic CHF exacerbation .  Cardiology consulted and following.  Underwent  right heart cath on 02/14/30 with finding of pulmonary HTN.   Overall status has significantly improved.  Currently she is on 2 to 3 days of oxygen per minute.  She is hemodynamically stable for discharge to skilled nursing facility today.  She will follow-up with cardiology as an outpatient.  Following problems were addressed during her hospitalization:  Acute on chronic hypoxic respiratory failure: Multifactorial.  Underlying CHF, severe pulmonary hypertension,COPD and also possible restrictive lung disease .   She is on oxygen at night at home at 2L/min.  High-resolution CT scan did not show any fibrotic lung disease. Please continue CPAP at night.  Acute on chronic systolic congestive heart failure: Presented with shortness of breath, lower extremity swelling. echocardiogram done here showed ejection fraction of 45 to 50%, global hypokinesis, severely reduced right ventricular systolic function, elevated pulmonary artery systolic pressure.  Findings consistent with cor pulmonale.  Cardiology following.  Underwent right heart catheterization which showed severe pulmonary hypertension .patient also has ICD for complete heart block Continue torsemide 40 mg daily.  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  Right heart catheterization done which showed severe pulmonary artery hypertension.  Continue on  sildenafil  Chronic respiratory insufficiency/restrictive lung disease: PFTs had shown significant restrictive lung disease with marked reduced DLCO.  Also has history of OSA on CPAP.  She says she is compliant with that.  Follows with pulmonology Dr. Halford Chessman. VQ scan done here did not show any pulmonary embolism.  Pulmonology was ollowing.  Paroxysmal atrial tachycardia: Cardiology following.  Continue current management  Hypertension: Monitor blood pressure.  Continue current regimen.  Coronary artery disease: Previous cardiac cath showed minor nonobstructive disease.  On statin, aspirin, beta-blocker.  CKD stage IIIb: Baseline creatinine ranges from 1.6-1.9.    Check BMP in 3 weeks   fever: Had a fever of 102 F  2 days ago.CxR showed  mild, diffuse chronic appearing increased lung markings with mild left basilar atelectasis and/or infiltrate. Suspicion for pneumonia.  Started on antibiotics. currently afebrile.  Diabetes type 2: Recent hemoglobin A1c of 6.  Currently diet-controlled only.   Continue current regimen  Debility/deconditioning: Patient seen by physical therapy and occupational therapy and recommended skilled nursing facility on discharge.        Discharge Diagnoses:  Principal Problem:   Acute respiratory failure with hypoxia (HCC) Active Problems:   Essential hypertension   COPD (chronic obstructive pulmonary disease) (HCC)   OSA on CPAP   CKD (chronic kidney disease) stage 4, GFR 15-29 ml/min (HCC)   Coronary artery disease involving native coronary artery of native heart without angina pectoris   Acute on chronic combined systolic and diastolic CHF (congestive heart failure) (HCC)   CHF exacerbation (HCC)   Pulmonary hypertension, unspecified ([place])    Discharge Instructions  Discharge Instructions     Diet - low sodium heart  healthy   Complete by: As directed    Discharge instructions   Complete by: As directed    1)Please take prescribed medications as instructed. 2)Follow up with cardiology on 03/05/2020 at 10:30 AM. 3)Please do a BMP test in 3 days.   Increase activity slowly   Complete by: As directed      Allergies as of 02/22/2020      Reactions   Benazepril Hcl    Lotrel [amlodipine Besy-benazepril Hcl]    Talwin [pentazocine]    Hallulate      Medication List    STOP taking these medications   furosemide 40 MG tablet Commonly known as: LASIX   metolazone 2.5 MG tablet Commonly known as: ZAROXOLYN   potassium chloride 10 MEQ tablet Commonly known as: KLOR-CON     TAKE these medications   Accu-Chek Aviva Plus test strip Generic drug: glucose blood USE  TO TEST BLOOD SUGAR ONE TIME DAILY   Accu-Chek Softclix Lancets lancets USE  AS  INSTRUCTED   albuterol 108 (90 Base) MCG/ACT inhaler Commonly known as: VENTOLIN HFA Inhale 2 puffs into the lungs every 6 (six) hours as needed for wheezing or shortness of breath.   amLODipine 10 MG tablet Commonly known as: NORVASC TAKE 1 TABLET EVERY DAY   aspirin 81 MG tablet Take 81 mg by mouth at bedtime.   atorvastatin 40 MG tablet Commonly known as: LIPITOR TAKE 1 TABLET EVERY DAY   B-D SINGLE USE SWABS REGULAR Pads USE  TO TEST BLOOD SUGAR ONE TIME DAILY   budesonide 0.5 MG/2ML nebulizer solution Commonly known as: PULMICORT Take 2 mLs (0.5 mg total) by nebulization 2 (two) times daily.   carvedilol 25 MG tablet Commonly known as: COREG TAKE 1 TABLET TWICE DAILY WITH A MEAL What changed: See the new instructions.   cefdinir 300 MG capsule Commonly known as: OMNICEF Take 1 capsule (300 mg total) by mouth 2 (two) times daily for 3 days.   ferrous sulfate 325 (65 FE) MG tablet Take 325 mg by mouth daily with breakfast.   ipratropium-albuterol 0.5-2.5 (3) MG/3ML Soln Commonly known as: DUONEB Take  3 mLs by nebulization every 6 (six) hours as needed.   Myrbetriq 50 MG Tb24 tablet Generic drug: mirabegron ER TAKE 1 TABLET EVERY DAY What changed: how much to take   OXYGEN Inhale 2 L into the lungs at bedtime.   polyethylene glycol 17 g packet Commonly known as: MIRALAX / GLYCOLAX Take 17 g by mouth daily as needed for moderate constipation.   potassium chloride SA 20 MEQ tablet Commonly known as: KLOR-CON Take 1 tablet (20 mEq total) by mouth daily.   sildenafil 20 MG tablet Commonly known as: REVATIO Take 1 tablet (20 mg total) by mouth 3 (three) times daily.   torsemide 20 MG tablet Commonly known as: DEMADEX Take 2 tablets (40 mg total) by mouth daily.   VITAMIN D (CHOLECALCIFEROL) PO Take 1 tablet by mouth daily.       Contact information for follow-up providers    [place] Follow up.   Specialty: Cardiology Why: Please follow-up with our [facility] on 03/05/2020 at 10:30am. Please arrive 15 minutes early for check-in. If this date/time does not work for you, please call our office to reschedule.  Contact information: [address] [id number] [place] [phone]           Contact information for after-discharge care    Destination  [facility] .   Service: Skilled Nursing Contact information: [place] [address] [phone]                 Allergies  Allergen Reactions  . Benazepril Hcl   . Lotrel [Amlodipine Besy-Benazepril Hcl]   . Talwin [Pentazocine]     Hallulate    Consultations:  PCCM,cardiology   Procedures/Studies: DG Chest 2 View  Result Date: 02/14/2020 CLINICAL DATA:  79 year old female with history of shortness of breath and hypoxia. EXAM: CHEST - 2 VIEW COMPARISON:  Chest x-ray 02/11/2020. FINDINGS: Lung volumes are normal. No consolidative airspace disease. No pleural effusions.  No evidence of pulmonary edema. Heart size is moderately enlarged. Upper mediastinal contours are within normal limits. Aortic atherosclerosis. Left-sided pacemaker/AICD with lead tips projecting over the expected location of the right atrium and right ventricle. IMPRESSION: 1. No radiographic evidence of acute cardiopulmonary disease. 2. Moderate cardiomegaly. 3. Aortic atherosclerosis. Electronically Signed   By: Vinnie Langton M.D.   On: 02/14/2020 16:06   CARDIAC CATHETERIZATION  Result Date: 02/15/2020 Findings: RA = 20 RV = 72/27 PA = 79/44 (53) PCW = 18 Fick cardiac output/index = 5.35/2.9 Thermo CO/CI = 4.4/2.4 PVR = 6.5 (Fick) 8.0 (Thermo) Ao sat = 94% PA sat = 64%, 64% SVC sat = 61% Assessment: 1. Severe PAH with normal left-sided pressures Plan/Discussion: There is near equalization or diastolic pressures which can reflect restrictive physiology or elevated R-sided pressure with normal left pressures. Given relatively normal PCWP - restriction felt to be less likely. Glori Bickers, MD 5:36 PM  CT Chest High Resolution  Result Date: 02/15/2020 CLINICAL DATA:  Interstitial lung disease, pulmonary hypertension EXAM: CT CHEST WITHOUT CONTRAST TECHNIQUE: Multidetector CT imaging of the chest was performed following the standard protocol without intravenous contrast. High resolution imaging of the lungs, as well as inspiratory and expiratory imaging, was performed. COMPARISON:  None. FINDINGS: Cardiovascular: Aortic atherosclerosis. Cardiomegaly. Scattered Three-vessel coronary artery calcifications. Left chest multi lead pacer. Enlargement of the main pulmonary artery up to 3.6 cm. No pericardial effusion. Mediastinum/Nodes: No enlarged mediastinal, hilar, or axillary lymph nodes. Thyroid gland, trachea, and esophagus demonstrate no significant findings. Lungs/Pleura: Small bilateral pleural effusions and associated atelectasis or consolidation. No evidence of fibrotic interstitial lung disease.  No significant air trapping on expiratory phase imaging. Upper Abdomen: No acute abnormality. Musculoskeletal: No chest wall mass or suspicious bone lesions identified. Anasarca. Disc degenerative disease and ankylosis of the thoracic spine IMPRESSION: 1. No evidence of fibrotic interstitial lung disease, however examination of the lung bases is limited by the presence of pleural effusions. Follow-up examination could be used to assess for mild bibasilar fibrotic interstitial lung disease at the resolution of pleural effusions, if warranted by ongoing suspicion for fibrotic interstitial lung disease. 2. Small bilateral pleural effusions and associated atelectasis or consolidation. 3. Cardiomegaly and coronary artery disease. 4. Enlargement of the main pulmonary artery, as can be seen in pulmonary hypertension. 5. Anasarca. 6. Aortic Atherosclerosis (ICD10-I70.0). Electronically Signed   By: Eddie Candle M.D.   On: 02/15/2020 20:28   NM Pulmonary Perf and Vent  Result Date: 02/12/2020 CLINICAL DATA:  79 year old female with respiratory failure. EXAM: NUCLEAR MEDICINE PERFUSION LUNG SCAN TECHNIQUE: Perfusion images were obtained in multiple projections after intravenous injection of Tc-10mMAA. RADIOPHARMACEUTICALS:  3.6 mCi Tc945mAA IV COMPARISON:  Chest radiograph dated 02/11/2020. FINDINGS: Perfusion: No wedge shaped peripheral perfusion defects to suggest acute pulmonary embolism. IMPRESSION: Negative for pulmonary embolism.  Electronically Signed   By: Anner Crete M.D.   On: 02/12/2020 16:37   DG CHEST PORT 1 VIEW  Result Date: 02/19/2020 CLINICAL DATA:  Shortness of breath. EXAM: PORTABLE CHEST 1 VIEW COMPARISON:  February 14, 2020 FINDINGS: There is a dual lead AICD. Mild, diffuse chronic appearing increased lung markings are seen. Mild atelectasis and/or infiltrate is seen within the left lung base. There is no evidence of a pleural effusion or pneumothorax. The cardiac silhouette is markedly enlarged.  Multilevel degenerative changes seen throughout the thoracic spine. IMPRESSION: Mild, diffuse chronic appearing increased lung markings with mild left basilar atelectasis and/or infiltrate. Electronically Signed   By: Virgina Norfolk M.D.   On: 02/19/2020 21:42   DG Chest Portable 1 View  Result Date: 02/11/2020 CLINICAL DATA:  Shortness of breath EXAM: PORTABLE CHEST 1 VIEW COMPARISON:  04/13/2018 FINDINGS: Cardiac shadow is enlarged. Defibrillator is again noted and stable. The overall inspiratory effort is poor with crowding of vascular markings. No focal infiltrate or effusion is seen. No acute bony abnormality is noted. IMPRESSION: Poor inspiratory effort.  No acute abnormality noted. Electronically Signed   By: Inez Catalina M.D.   On: 02/11/2020 20:00   ECHOCARDIOGRAM COMPLETE  Result Date: 02/12/2020    ECHOCARDIOGRAM REPORT   Patient Name:   AJAYLA IGLESIAS Date of Exam: 02/12/2020 Medical Rec #:  [id number]       Height:       62.0 in Accession #:    [id number]      Weight:       167.6 lb Date of Birth:  [birth year]        BSA:          1.773 m Patient Age:    9 years        BP:           118/62 mmHg Patient Gender: F               HR:           64 bpm. Exam Location:  Inpatient Procedure: 2D Echo Indications:    CHF 428.31  History:        Patient has prior history of Echocardiogram examinations, most                 recent 05/13/2015. CHF and Cardiomyopathy, CAD, COPD and Stroke;                 Risk Factors:Hypertension and Diabetes. Defibrillator. pulmonary                 hypertension.  Sonographer:    Jannett Celestine RDCS (AE) Referring Phys: [place]  1. Left ventricular ejection fraction, by estimation, is 45 to 50%. The left ventricle has mildly decreased function. The left ventricle demonstrates global hypokinesis. Left ventricular diastolic function could not be evaluated. There is the interventricular septum is flattened in systole and diastole, consistent with right  ventricular pressure and volume overload.  2. Right ventricular systolic function is severely reduced. The right ventricular size is severely enlarged. There is moderately elevated pulmonary artery systolic pressure. The estimated right ventricular systolic pressure is 73.7 mmHg.  3. Right atrial size was severely dilated.  4. The mitral valve is grossly normal. Mild mitral valve regurgitation. No evidence of mitral stenosis.  5. The tricuspid valve is abnormal. 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  6. The aortic valve is tricuspid. Aortic valve regurgitation is not visualized.  No aortic stenosis is present.  7. The inferior vena cava is normal in size with greater than 50% respiratory variability, suggesting right atrial pressure of 3 mmHg. Comparison(s): No prior Echocardiogram. Unable to view prior echocardiogram. Conclusion(s)/Recommendation(s): Findings consistent with Cor Pulmonale. FINDINGS  Left Ventricle: Left ventricular ejection fraction, by estimation, is 45 to 50%. The left ventricle has mildly decreased function. The left ventricle demonstrates global hypokinesis. The left ventricular internal cavity size was normal in size. There is  no left ventricular hypertrophy. The interventricular septum is flattened in systole and diastole, consistent with right ventricular pressure and volume overload. Left ventricular diastolic function could not be evaluated due to abnormal septal motion. Left ventricular diastolic function could not be evaluated. Right Ventricle: The right ventricular size is severely enlarged. No increase in right ventricular wall thickness. Right ventricular systolic function is severely reduced. There is moderately elevated pulmonary artery systolic pressure. The tricuspid regurgitant velocity is 3.05 m/s, and with an assumed right atrial pressure of 15 mmHg, the estimated right ventricular systolic pressure is 57.3 mmHg. Left Atrium: Left atrial size was normal in size.  Right Atrium: Right atrial size was severely dilated. Pericardium: Trivial pericardial effusion is present. Mitral Valve: The mitral valve is grossly normal. Mild mitral valve regurgitation. No evidence of mitral valve stenosis. Tricuspid Valve: The tricuspid valve is abnormal. 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 Aortic Valve: The aortic valve is tricuspid. Aortic valve regurgitation is not visualized. No aortic stenosis is present. Aortic valve mean gradient measures 10.0 mmHg. Aortic valve peak gradient measures 15.2 mmHg. Aortic valve area, by VTI measures 1.27 cm. Pulmonic Valve: The pulmonic valve was grossly normal. Pulmonic valve regurgitation is mild. No evidence of pulmonic stenosis. Aorta: The aortic root is normal in size and structure. Venous: The inferior vena cava is normal in size with greater than 50% respiratory variability, suggesting right atrial pressure of 3 mmHg. IAS/Shunts: The atrial septum is grossly normal. Additional Comments: A pacer wire is visualized in the right atrium and right ventricle.  LEFT VENTRICLE PLAX 2D LVIDd:         5.10 cm  Diastology LVIDs:         3.60 cm  LV e' lateral:   12.00 cm/s LV PW:         0.90 cm  LV E/e' lateral: 5.4 LV IVS:        0.90 cm  LV e' medial:    5.55 cm/s LVOT diam:     2.20 cm  LV E/e' medial:  11.6 LV SV:         71 LV SV Index:   40 LVOT Area:     3.80 cm  LEFT ATRIUM           Index       RIGHT ATRIUM           Index LA diam:      5.00 cm 2.82 cm/m  RA Area:     29.50 cm LA Vol (A4C): 32.7 ml 18.44 ml/m RA Volume:   102.00 ml 57.52 ml/m  AORTIC VALVE AV Area (Vmax):    1.73 cm AV Area (Vmean):   1.60 cm AV Area (VTI):     1.27 cm AV Vmax:           195.00 cm/s AV Vmean:          154.000 cm/s AV VTI:            0.558 m AV  Peak Grad:      15.2 mmHg AV Mean Grad:      10.0 mmHg LVOT Vmax:         88.80 cm/s LVOT Vmean:        64.700 cm/s LVOT VTI:          0.186 m LVOT/AV VTI ratio: 0.33  AORTA Ao Root diam: 3.10 cm  MITRAL VALVE               TRICUSPID VALVE MV Area (PHT): 3.08 cm    TR Peak grad:   37.2 mmHg MV Decel Time: 246 msec    TR Vmax:        305.00 cm/s MV E velocity: 64.30 cm/s MV A velocity: 66.80 cm/s  SHUNTS MV E/A ratio:  0.96        Systemic VTI:  0.19 m                            Systemic Diam: 2.20 cm Eleonore Chiquito MD Electronically signed by Eleonore Chiquito MD Signature Date/Time: 02/12/2020/12:32:04 PM    Final       Subjective: Patient seen and examined the bedside today.  Hemodynamically stable for discharge.  Discharge Exam: Vitals:   02/22/20 0431 02/22/20 0925  BP: (!) 106/59   Pulse: (!) 59   Resp: 16   Temp: 98.9 F (37.2 C)   SpO2: 100% 94%   Vitals:   02/21/20 2043 02/21/20 2114 02/22/20 0431 02/22/20 0925  BP: (!) 105/42  (!) 106/59   Pulse: (!) 59  (!) 59   Resp: 17  16   Temp: 98.5 F (36.9 C)  98.9 F (37.2 C)   TempSrc: Oral  Oral   SpO2: 94% 94% 100% 94%  Weight:   75.8 kg   Height:        General: Pt is alert, awake, not in acute distress Cardiovascular: RRR, S1/S2 +, no rubs, no gallops Respiratory: CTA bilaterally, no wheezing, no rhonchi Abdominal: Soft, NT, ND, bowel sounds + Extremities: no edema, no cyanosis    The results of significant diagnostics from this hospitalization (including imaging, microbiology, ancillary and laboratory) are listed below for reference.     Microbiology: Recent Results (from the past 240 hour(s))  Culture, blood (routine x 2)     Status: None (Preliminary result)   Collection Time: 02/19/20 10:50 PM   Specimen: BLOOD  Result Value Ref Range Status   Specimen Description BLOOD LEFT HAND  Final   Special Requests   Final    BOTTLES DRAWN AEROBIC AND ANAEROBIC Blood Culture adequate volume   Culture   Final    NO GROWTH 1 DAY Performed at [facility], [address], [place], [place] [address]    Report Status PENDING  Incomplete  Culture, blood (routine x 2)     Status: None (Preliminary result)    Collection Time: 02/19/20 10:50 PM   Specimen: BLOOD  Result Value Ref Range Status   Specimen Description BLOOD RIGHT HAND  Final   Special Requests   Final    BOTTLES DRAWN AEROBIC AND ANAEROBIC Blood Culture adequate volume   Culture   Final    NO GROWTH 1 DAY Performed at [facility], [place] [address] [place], [place] [address]    Report Status PENDING  Incomplete  Culture, Urine     Status: Abnormal   Collection Time: 02/20/20  6:03 AM  Specimen: Urine, Random  Result Value Ref Range Status   Specimen Description URINE, RANDOM  Final   Special Requests NONE  Final   Culture (A)  Final    <10,000 COLONIES/mL INSIGNIFICANT GROWTH Performed at [facility], [place] [address] [place], [place] [address]    Report Status 02/21/2020 FINAL  Final  SARS Coronavirus 2 by RT PCR (hospital order, performed in [facility] hospital lab) Nasopharyngeal Nasopharyngeal Swab     Status: None   Collection Time: 02/20/20  4:07 PM   Specimen: Nasopharyngeal Swab  Result Value Ref Range Status   SARS Coronavirus 2 NEGATIVE NEGATIVE Final    Comment: (NOTE) SARS-CoV-2 target nucleic acids are NOT DETECTED.  The SARS-CoV-2 RNA is generally detectable in upper and lower respiratory specimens during the acute phase of infection. The lowest concentration of SARS-CoV-2 viral copies this assay can detect is 250 copies / mL. A negative result does not preclude SARS-CoV-2 infection and should not be used as the sole basis for treatment or other patient management decisions.  A negative result may occur with improper specimen collection / handling, submission of specimen other than nasopharyngeal swab, presence of viral mutation(s) within the areas targeted by this assay, and inadequate number of viral copies (<250 copies / mL). A negative result must be combined with clinical observations, patient history, and epidemiological information.  Fact Sheet for Patients:    StrictlyIdeas.no  Fact Sheet for Healthcare Providers: BankingDealers.co.za  This test is not yet approved or  cleared by the [place] FDA and has been authorized for detection and/or diagnosis of SARS-CoV-2 by FDA under an Emergency Use Authorization (EUA).  This EUA will remain in effect (meaning this test can be used) for the duration of the COVID-19 declaration under Section 564(b)(1) of the Act, 21 U.S.C. section 360bbb-3(b)(1), unless the authorization is terminated or revoked sooner.  Performed at [facility], [place] [address] [place], [place] [address]      Labs: BNP (last 3 results) Recent Labs    02/11/20 2014 02/21/20 0633  BNP 617.2* 098.1*   Basic Metabolic Panel: Recent Labs  Lab 02/17/20 0358 02/17/20 0358 02/18/20 0629 02/19/20 0532 02/20/20 0510 02/21/20 0633 02/22/20 0607  NA 138  --  139 137 135 136  --   K 4.4  --  4.3 4.4 4.4 4.4  --   CL 101  --  96* 93* 94* 94*  --   CO2 29  --  32 35* 33* 33*  --   GLUCOSE 114*  --  128* 113* 109* 92  --   BUN 48*  --  47* 47* 48* 50*  --   CREATININE 1.76*   < > 1.98* 1.96* 2.19* 2.07* 2.00*  CALCIUM 8.5*  --  9.0 8.7* 8.7* 8.5*  --    < > = values in this interval not displayed.   Liver Function Tests: No results for input(s): AST, ALT, ALKPHOS, BILITOT, PROT, ALBUMIN in the last 168 hours. No results for input(s): LIPASE, AMYLASE in the last 168 hours. No results for input(s): AMMONIA in the last 168 hours. CBC: Recent Labs  Lab 02/15/20 1713 02/15/20 1717 02/15/20 1818 02/20/20 0510  WBC  --   --  6.5 6.3  NEUTROABS  --   --   --  4.3  HGB 10.2*  12.6 12.2 11.3* 10.8*  HCT 30.0*  37.0 36.0 36.9 35.4*  MCV  --   --  88.1 87.8  PLT  --   --  190 198   Cardiac Enzymes: No results for input(s): CKTOTAL, CKMB, CKMBINDEX, TROPONINI in the last 168 hours. BNP: Invalid input(s): POCBNP CBG: Recent Labs  Lab 02/21/20 0623  02/21/20 1209 02/21/20 1627 02/21/20 2046 02/22/20 0619  GLUCAP 87 191* 187* 189* 101*   D-Dimer No results for input(s): DDIMER in the last 72 hours. Hgb A1c No results for input(s): HGBA1C in the last 72 hours. Lipid Profile No results for input(s): CHOL, HDL, LDLCALC, TRIG, CHOLHDL, LDLDIRECT in the last 72 hours. Thyroid function studies No results for input(s): TSH, T4TOTAL, T3FREE, THYROIDAB in the last 72 hours.  Invalid input(s): FREET3 Anemia work up No results for input(s): VITAMINB12, FOLATE, FERRITIN, TIBC, IRON, RETICCTPCT in the last 72 hours. Urinalysis    Component Value Date/Time   COLORURINE YELLOW 02/20/2020 [place] 02/20/2020 0735   LABSPEC 1.008 02/20/2020 0735   LABSPEC 1.015 08/25/2018 1448   PHURINE 5.0 02/20/2020 0735   GLUCOSEU NEGATIVE 02/20/2020 0735   HGBUR NEGATIVE 02/20/2020 0735   BILIRUBINUR NEGATIVE 02/20/2020 0735   BILIRUBINUR negative 08/25/2018 1448   KETONESUR NEGATIVE 02/20/2020 0735   PROTEINUR NEGATIVE 02/20/2020 0735   UROBILINOGEN 4.0 (H) 07/05/2007 1115   NITRITE NEGATIVE 02/20/2020 0735   LEUKOCYTESUR NEGATIVE 02/20/2020 0735   Sepsis Labs Invalid input(s): PROCALCITONIN,  WBC,  LACTICIDVEN Microbiology Recent Results (from the past 240 hour(s))  Culture, blood (routine x 2)     Status: None (Preliminary result)   Collection Time: 02/19/20 10:50 PM   Specimen: BLOOD  Result Value Ref Range Status   Specimen Description BLOOD LEFT HAND  Final   Special Requests   Final    BOTTLES DRAWN AEROBIC AND ANAEROBIC Blood Culture adequate volume   Culture   Final    NO GROWTH 1 DAY Performed at [facility], [place] [address], [place], [place] [address]    Report Status PENDING  Incomplete  Culture, blood (routine x 2)     Status: None (Preliminary result)   Collection Time: 02/19/20 10:50 PM   Specimen: BLOOD  Result Value Ref Range Status   Specimen Description BLOOD RIGHT HAND  Final   Special  Requests   Final    BOTTLES DRAWN AEROBIC AND ANAEROBIC Blood Culture adequate volume   Culture   Final    NO GROWTH 1 DAY Performed at [facility], [place] [address], [place], [place] [address]    Report Status PENDING  Incomplete  Culture, Urine     Status: Abnormal   Collection Time: 02/20/20  6:03 AM   Specimen: Urine, Random  Result Value Ref Range Status   Specimen Description URINE, RANDOM  Final   Special Requests NONE  Final   Culture (A)  Final    <10,000 COLONIES/mL INSIGNIFICANT GROWTH Performed at [facility], [place] [address] [place], [place] [address]    Report Status 02/21/2020 FINAL  Final  SARS Coronavirus 2 by RT PCR (hospital order, performed in [facility] hospital lab) Nasopharyngeal Nasopharyngeal Swab     Status: None   Collection Time: 02/20/20  4:07 PM   Specimen: Nasopharyngeal Swab  Result Value Ref Range Status   SARS Coronavirus 2 NEGATIVE NEGATIVE Final    Comment: (NOTE) SARS-CoV-2 target nucleic acids are NOT DETECTED.  The SARS-CoV-2 RNA is generally detectable in upper and lower respiratory specimens during the acute phase of infection. The lowest concentration of SARS-CoV-2 viral copies this assay can detect is 250 copies / mL.  A negative result does not preclude SARS-CoV-2 infection and should not be used as the sole basis for treatment or other patient management decisions.  A negative result may occur with improper specimen collection / handling, submission of specimen other than nasopharyngeal swab, presence of viral mutation(s) within the areas targeted by this assay, and inadequate number of viral copies (<250 copies / mL). A negative result must be combined with clinical observations, patient history, and epidemiological information.  Fact Sheet for Patients:   StrictlyIdeas.no  Fact Sheet for Healthcare Providers: BankingDealers.co.za  This test is not yet approved or   cleared by the [place] FDA and has been authorized for detection and/or diagnosis of SARS-CoV-2 by FDA under an Emergency Use Authorization (EUA).  This EUA will remain in effect (meaning this test can be used) for the duration of the COVID-19 declaration under Section 564(b)(1) of the Act, 21 U.S.C. section 360bbb-3(b)(1), unless the authorization is terminated or revoked sooner.  Performed at [facility], [place] [address] [place], [place] [address]     Please note: You were cared for by a hospitalist during your hospital stay. Once you are discharged, your primary care physician will handle any further medical issues. Please note that NO REFILLS for any discharge medications will be authorized once you are discharged, as it is imperative that you return to your primary care physician (or establish a relationship with a primary care physician if you do not have one) for your post hospital discharge needs so that they can reassess your need for medications and monitor your lab values.    Time coordinating discharge: 40 minutes  SIGNED:   Shelly Coss, MD  [facility] 02/22/2020, 9:37 AM Pager [phone]  If 7PM-7AM, please contact night-coverage [email or web address] Password [email or web address]

## 2020-02-21 NOTE — Care Management Important Message (Signed)
Important Message  Patient Details  Name: [name] MRN: [id number] Date of Birth: Aug 30, 1940   Medicare Important Message Given:  Yes     Shelda Altes 02/21/2020, 1:33 PM

## 2020-02-21 NOTE — Progress Notes (Addendum)
Occupational Therapy Treatment Patient Details Name: [name] MRN: [id number] DOB: [birth year] Today's Date: 02/21/2020    History of present illness 79 y.o. 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   OT comments  Pt seated in recliner agreeable to therapy session. Pt performing both room and hallway distance mobility during session, overall performing with minguard assist using RW. Pt requiring increased time and intermittent rest breaks due to fatigue, but overall tolerating well and motivated to progress with therapies. Difficult obtaining good pleth/O2 reading with activity, however SpO2 >95% on 3L start of session, use of 6L during activity as noted pt requiring at least 5L O2 during yesterday PT session with mobility. Pt without significant dyspnea during activity and Spo2 91% after returned to room. Returned to 3L end of session. Continue to recommend SNF level therapies to progress pt towards her PLOF. Will follow while acutely admitted.   Follow Up Recommendations  SNF    Equipment Recommendations  None recommended by OT          Precautions / Restrictions Precautions Precautions: Fall Restrictions Weight Bearing Restrictions: No       Mobility Bed Mobility               General bed mobility comments: Patient seated in recliner upon entry.   Transfers Overall transfer level: Needs assistance Equipment used: Rolling walker (2 wheeled) Transfers: Sit to/from Stand Sit to Stand: Min guard         General transfer comment: for balance and safety, performed x2 during session     Balance Overall balance  assessment: Needs assistance Sitting-balance support: No upper extremity supported;Feet supported Sitting balance-Leahy Scale: Fair     Standing balance support: During functional activity;Single extremity supported Standing balance-Leahy Scale: Poor Standing balance comment: relies on UE support for balance                           ADL either performed or assessed with clinical judgement   ADL Overall ADL's : Needs assistance/impaired                 Upper Body Dressing : Min guard;Sitting Upper Body Dressing Details (indicate cue type and reason): to don new gown                 Functional mobility during ADLs: Min guard;Rolling walker                         Cognition Arousal/Alertness: Awake/alert Behavior During Therapy: WFL for tasks assessed/performed Overall Cognitive Status: Within Functional Limits for tasks assessed                                          Exercises     Shoulder Instructions       General Comments      Pertinent Vitals/ Pain       Pain Assessment: No/denies pain  Home Living  Prior Functioning/Environment              Frequency  Min 2X/week        Progress Toward Goals  OT Goals(current goals can now be found in the care plan section)  Progress towards OT goals: Progressing toward goals  Acute Rehab OT Goals Patient Stated Goal: Patient requesting short-term SNF rehab.  OT Goal Formulation: With patient Time For Goal Achievement: 02/27/20 Potential to Achieve Goals: Good ADL Goals Pt Will Perform Grooming: with modified independence;standing Pt Will Perform Lower Body Bathing: with modified independence;sit to/from stand;sitting/lateral leans;with adaptive equipment Pt Will Perform Lower Body Dressing: with modified independence;with adaptive equipment;sitting/lateral leans;sit to/from stand Pt Will Transfer to  Toilet: with modified independence;ambulating;bedside commode Pt Will Perform Toileting - Clothing Manipulation and hygiene: with modified independence;sitting/lateral leans;sit to/from stand Additional ADL Goal #1: Patient will recall and demonstrate use of 3 energy conservation techniques in prep for BADLs.  Plan Discharge plan remains appropriate    Co-evaluation                 AM-PAC OT "6 Clicks" Daily Activity     Outcome Measure   Help from another person eating meals?: None Help from another person taking care of personal grooming?: A Little Help from another person toileting, which includes using toliet, bedpan, or urinal?: A Little Help from another person bathing (including washing, rinsing, drying)?: A Lot Help from another person to put on and taking off regular upper body clothing?: A Little Help from another person to put on and taking off regular lower body clothing?: A Little 6 Click Score: 18    End of Session Equipment Utilized During Treatment: Gait belt;Rolling walker;Oxygen  OT Visit Diagnosis: Unsteadiness on feet (R26.81);Muscle weakness (generalized) (M62.81)   Activity Tolerance Patient tolerated treatment well   Patient Left in chair;with call bell/phone within reach   Nurse Communication Mobility status        Time: 6967-8938 OT Time Calculation (min): 35 min  Charges: OT General Charges $OT Visit: 1 Visit OT Treatments $Self Care/Home Management : 8-22 mins $Therapeutic Activity: 8-22 mins   Lou Cal, OT Acute Rehabilitation Services Pager [phone] Office [phone]    Raymondo Band 02/21/2020, 4:43 PM

## 2020-02-22 DIAGNOSIS — R262 Difficulty in walking, not elsewhere classified: Secondary | ICD-10-CM | POA: Diagnosis not present

## 2020-02-22 DIAGNOSIS — I5021 Acute systolic (congestive) heart failure: Secondary | ICD-10-CM | POA: Diagnosis not present

## 2020-02-22 DIAGNOSIS — M7989 Other specified soft tissue disorders: Secondary | ICD-10-CM | POA: Diagnosis present

## 2020-02-22 DIAGNOSIS — E1165 Type 2 diabetes mellitus with hyperglycemia: Secondary | ICD-10-CM | POA: Diagnosis not present

## 2020-02-22 DIAGNOSIS — I2781 Cor pulmonale (chronic): Secondary | ICD-10-CM | POA: Diagnosis present

## 2020-02-22 DIAGNOSIS — J9601 Acute respiratory failure with hypoxia: Secondary | ICD-10-CM | POA: Diagnosis not present

## 2020-02-22 DIAGNOSIS — D631 Anemia in chronic kidney disease: Secondary | ICD-10-CM | POA: Diagnosis present

## 2020-02-22 DIAGNOSIS — E871 Hypo-osmolality and hyponatremia: Secondary | ICD-10-CM | POA: Diagnosis not present

## 2020-02-22 DIAGNOSIS — I2510 Atherosclerotic heart disease of native coronary artery without angina pectoris: Secondary | ICD-10-CM | POA: Diagnosis present

## 2020-02-22 DIAGNOSIS — E119 Type 2 diabetes mellitus without complications: Secondary | ICD-10-CM | POA: Diagnosis not present

## 2020-02-22 DIAGNOSIS — Z03818 Encounter for observation for suspected exposure to other biological agents ruled out: Secondary | ICD-10-CM | POA: Diagnosis not present

## 2020-02-22 DIAGNOSIS — I480 Paroxysmal atrial fibrillation: Secondary | ICD-10-CM | POA: Diagnosis not present

## 2020-02-22 DIAGNOSIS — J9600 Acute respiratory failure, unspecified whether with hypoxia or hypercapnia: Secondary | ICD-10-CM | POA: Diagnosis not present

## 2020-02-22 DIAGNOSIS — R498 Other voice and resonance disorders: Secondary | ICD-10-CM | POA: Diagnosis not present

## 2020-02-22 DIAGNOSIS — R0600 Dyspnea, unspecified: Secondary | ICD-10-CM | POA: Diagnosis present

## 2020-02-22 DIAGNOSIS — I959 Hypotension, unspecified: Secondary | ICD-10-CM | POA: Diagnosis not present

## 2020-02-22 DIAGNOSIS — Z9989 Dependence on other enabling machines and devices: Secondary | ICD-10-CM | POA: Diagnosis not present

## 2020-02-22 DIAGNOSIS — Z20822 Contact with and (suspected) exposure to covid-19: Secondary | ICD-10-CM | POA: Diagnosis present

## 2020-02-22 DIAGNOSIS — Z95810 Presence of automatic (implantable) cardiac defibrillator: Secondary | ICD-10-CM | POA: Diagnosis not present

## 2020-02-22 DIAGNOSIS — N179 Acute kidney failure, unspecified: Secondary | ICD-10-CM | POA: Diagnosis present

## 2020-02-22 DIAGNOSIS — E7800 Pure hypercholesterolemia, unspecified: Secondary | ICD-10-CM | POA: Diagnosis present

## 2020-02-22 DIAGNOSIS — R29810 Facial weakness: Secondary | ICD-10-CM | POA: Diagnosis not present

## 2020-02-22 DIAGNOSIS — I5082 Biventricular heart failure: Secondary | ICD-10-CM | POA: Diagnosis present

## 2020-02-22 DIAGNOSIS — R1312 Dysphagia, oropharyngeal phase: Secondary | ICD-10-CM | POA: Diagnosis not present

## 2020-02-22 DIAGNOSIS — I952 Hypotension due to drugs: Secondary | ICD-10-CM | POA: Diagnosis present

## 2020-02-22 DIAGNOSIS — I2721 Secondary pulmonary arterial hypertension: Secondary | ICD-10-CM | POA: Diagnosis present

## 2020-02-22 DIAGNOSIS — E1122 Type 2 diabetes mellitus with diabetic chronic kidney disease: Secondary | ICD-10-CM | POA: Diagnosis present

## 2020-02-22 DIAGNOSIS — G4733 Obstructive sleep apnea (adult) (pediatric): Secondary | ICD-10-CM | POA: Diagnosis present

## 2020-02-22 DIAGNOSIS — M6281 Muscle weakness (generalized): Secondary | ICD-10-CM | POA: Diagnosis not present

## 2020-02-22 DIAGNOSIS — T467X5A Adverse effect of peripheral vasodilators, initial encounter: Secondary | ICD-10-CM | POA: Diagnosis present

## 2020-02-22 DIAGNOSIS — R0902 Hypoxemia: Secondary | ICD-10-CM | POA: Diagnosis not present

## 2020-02-22 DIAGNOSIS — E785 Hyperlipidemia, unspecified: Secondary | ICD-10-CM | POA: Diagnosis present

## 2020-02-22 DIAGNOSIS — I517 Cardiomegaly: Secondary | ICD-10-CM | POA: Diagnosis not present

## 2020-02-22 DIAGNOSIS — I5041 Acute combined systolic (congestive) and diastolic (congestive) heart failure: Secondary | ICD-10-CM | POA: Diagnosis not present

## 2020-02-22 DIAGNOSIS — I442 Atrioventricular block, complete: Secondary | ICD-10-CM | POA: Diagnosis present

## 2020-02-22 DIAGNOSIS — R279 Unspecified lack of coordination: Secondary | ICD-10-CM | POA: Diagnosis not present

## 2020-02-22 DIAGNOSIS — G4734 Idiopathic sleep related nonobstructive alveolar hypoventilation: Secondary | ICD-10-CM | POA: Diagnosis not present

## 2020-02-22 DIAGNOSIS — R0689 Other abnormalities of breathing: Secondary | ICD-10-CM | POA: Diagnosis not present

## 2020-02-22 DIAGNOSIS — I5040 Unspecified combined systolic (congestive) and diastolic (congestive) heart failure: Secondary | ICD-10-CM | POA: Diagnosis not present

## 2020-02-22 DIAGNOSIS — R600 Localized edema: Secondary | ICD-10-CM | POA: Diagnosis not present

## 2020-02-22 DIAGNOSIS — I130 Hypertensive heart and chronic kidney disease with heart failure and stage 1 through stage 4 chronic kidney disease, or unspecified chronic kidney disease: Secondary | ICD-10-CM | POA: Diagnosis present

## 2020-02-22 DIAGNOSIS — I509 Heart failure, unspecified: Secondary | ICD-10-CM | POA: Diagnosis not present

## 2020-02-22 DIAGNOSIS — J449 Chronic obstructive pulmonary disease, unspecified: Secondary | ICD-10-CM | POA: Diagnosis present

## 2020-02-22 DIAGNOSIS — R0602 Shortness of breath: Secondary | ICD-10-CM | POA: Diagnosis not present

## 2020-02-22 DIAGNOSIS — I071 Rheumatic tricuspid insufficiency: Secondary | ICD-10-CM | POA: Diagnosis present

## 2020-02-22 DIAGNOSIS — E1142 Type 2 diabetes mellitus with diabetic polyneuropathy: Secondary | ICD-10-CM | POA: Diagnosis present

## 2020-02-22 DIAGNOSIS — I5023 Acute on chronic systolic (congestive) heart failure: Secondary | ICD-10-CM | POA: Diagnosis present

## 2020-02-22 DIAGNOSIS — N184 Chronic kidney disease, stage 4 (severe): Secondary | ICD-10-CM | POA: Diagnosis present

## 2020-02-22 DIAGNOSIS — J984 Other disorders of lung: Secondary | ICD-10-CM | POA: Diagnosis present

## 2020-02-22 DIAGNOSIS — R471 Dysarthria and anarthria: Secondary | ICD-10-CM | POA: Diagnosis not present

## 2020-02-22 DIAGNOSIS — Z743 Need for continuous supervision: Secondary | ICD-10-CM | POA: Diagnosis not present

## 2020-02-22 DIAGNOSIS — I110 Hypertensive heart disease with heart failure: Secondary | ICD-10-CM | POA: Diagnosis not present

## 2020-02-22 DIAGNOSIS — I2723 Pulmonary hypertension due to lung diseases and hypoxia: Secondary | ICD-10-CM | POA: Diagnosis present

## 2020-02-22 DIAGNOSIS — J9621 Acute and chronic respiratory failure with hypoxia: Secondary | ICD-10-CM | POA: Diagnosis present

## 2020-02-22 LAB — GLUCOSE, CAPILLARY
Glucose-Capillary: 101 mg/dL — ABNORMAL HIGH (ref 70–99)
Glucose-Capillary: 163 mg/dL — ABNORMAL HIGH (ref 70–99)
Glucose-Capillary: 164 mg/dL — ABNORMAL HIGH (ref 70–99)

## 2020-02-22 LAB — CREATININE, SERUM
Creatinine, Ser: 2 mg/dL — ABNORMAL HIGH (ref 0.44–1.00)
GFR calc Af Amer: 26 mL/min — ABNORMAL LOW (ref >60)
GFR calc non Af Amer: 23 mL/min — ABNORMAL LOW (ref >60)

## 2020-02-22 NOTE — TOC Transition Note (Signed)
Transition of Care [facility]) - CM/SW Discharge Note   Patient Details  Name: [name] MRN: [id number] Date of Birth: 1940-08-03  Transition of Care [facility]) CM/SW Contact:  Vinie Sill, Brandt Phone Number: 02/22/2020, 1:42 PM   Clinical Narrative:      Patient will DC to: [facility] Date:02/22/2020 Family Notified:Sahron,daughter Transport PB:AQVO   Per MD patient is ready for discharge. RN, patient, and facility notified of DC. Discharge Summary sent to facility. RN given number for [phone], Room 208-P. Ambulance transport requested for patient.   Clinical Social Worker signing off.  Thurmond Butts, MSW, LCSWA Clinical Social Worker    Final next level of care: Skilled Nursing Facility Barriers to Discharge: Barriers Resolved   Patient Goals and CMS Choice   CMS Medicare.gov Compare Post Acute Care list provided to:: Patient Choice offered to / list presented to : Patient  Discharge Placement PASRR number recieved: 02/14/20            Patient chooses bed at: [facility] Patient to be transferred to facility by: [place] Name of family member notified: Sharon,daughter Patient and family notified of of transfer: 02/22/20  Discharge Plan and Services                                     Social Determinants of Health (SDOH) Interventions     Readmission Risk Interventions No flowsheet data found.

## 2020-02-22 NOTE — Progress Notes (Signed)
Report given to [facility] talked with Lynda,Rn she reported patient going to room 208-p. No further changes noted.

## 2020-02-22 NOTE — Progress Notes (Signed)
Called Lynda,Rn back gave settings for CPAP(was on auto settings).

## 2020-02-22 NOTE — Progress Notes (Signed)
D/C instructions printed and in packet at nursing station for [facility]. Tele and IV's removed. Tolerated well. Awaiting PTAR for transport.

## 2020-02-22 NOTE — Progress Notes (Signed)
PT Cancellation Note  Patient Details Name: [name] MRN: [id number] DOB: 1940-08-28   Cancelled Treatment:    Reason Eval/Treat Not Completed: Other (comment).  Asked pt to do physical therapy and she refused, stating she would wait for care first.  Pt is sitting on side of bed in the dark, very clear she is not interested in doing therapy now.  Re-attempt as time and pt allow.   Ramond Dial 02/22/2020, 12:32 PM   Mee Hives, PT MS Acute Rehab Dept. Number: [place] [facility] [place]

## 2020-02-22 NOTE — Progress Notes (Signed)
Daughter in see patient asking why CNA has not gotten her mom up today she looks like she has not gotten any care this morning. She asked me why her mom had to wait to get off CPAP this morning told her I walked into this morning a surgical case I was required to accompany down to the OR,so the RT was called remove the CPAP in my absence it took a little bit for him to reach our floor since he is not based on our unit. I told her the unit has been very busy today and so has the CNA. The CNA went in and offered care the daughter told her go home and take care of someone else she did not need her. I went in also she told me she had it did not want help. She c/o her mom sitting in the dark,I reported just in the room giving meds and her Mom asked me turn the light out so she could rest the Mom admitted in front of her she did tell me that. Daughter asking me for her discharge time told her about to attend care conference would find out then.

## 2020-02-22 NOTE — Progress Notes (Signed)
Patient seen and examined at the bedside this morning.Hemodynamically stable and comfortable.She is stable for DC to SNF today. DC orders and summary are already on place.

## 2020-02-25 LAB — CULTURE, BLOOD (ROUTINE X 2)
Culture: NO GROWTH
Culture: NO GROWTH
Special Requests: ADEQUATE
Special Requests: ADEQUATE

## 2020-02-27 DIAGNOSIS — I5040 Unspecified combined systolic (congestive) and diastolic (congestive) heart failure: Secondary | ICD-10-CM | POA: Diagnosis not present

## 2020-02-27 DIAGNOSIS — G4734 Idiopathic sleep related nonobstructive alveolar hypoventilation: Secondary | ICD-10-CM | POA: Diagnosis not present

## 2020-02-27 DIAGNOSIS — I2721 Secondary pulmonary arterial hypertension: Secondary | ICD-10-CM | POA: Diagnosis not present

## 2020-02-27 DIAGNOSIS — G4733 Obstructive sleep apnea (adult) (pediatric): Secondary | ICD-10-CM | POA: Diagnosis not present

## 2020-02-29 DIAGNOSIS — R471 Dysarthria and anarthria: Secondary | ICD-10-CM | POA: Diagnosis not present

## 2020-02-29 DIAGNOSIS — R29810 Facial weakness: Secondary | ICD-10-CM | POA: Diagnosis not present

## 2020-02-29 DIAGNOSIS — R600 Localized edema: Secondary | ICD-10-CM | POA: Diagnosis not present

## 2020-02-29 DIAGNOSIS — I5041 Acute combined systolic (congestive) and diastolic (congestive) heart failure: Secondary | ICD-10-CM | POA: Diagnosis not present

## 2020-03-01 ENCOUNTER — Encounter: Payer: Self-pay | Admitting: Cardiovascular Disease

## 2020-03-01 DIAGNOSIS — I2721 Secondary pulmonary arterial hypertension: Secondary | ICD-10-CM | POA: Diagnosis not present

## 2020-03-01 DIAGNOSIS — J984 Other disorders of lung: Secondary | ICD-10-CM | POA: Diagnosis not present

## 2020-03-01 DIAGNOSIS — R600 Localized edema: Secondary | ICD-10-CM | POA: Diagnosis not present

## 2020-03-01 DIAGNOSIS — N184 Chronic kidney disease, stage 4 (severe): Secondary | ICD-10-CM | POA: Diagnosis not present

## 2020-03-01 DIAGNOSIS — I480 Paroxysmal atrial fibrillation: Secondary | ICD-10-CM | POA: Diagnosis not present

## 2020-03-01 DIAGNOSIS — I5041 Acute combined systolic (congestive) and diastolic (congestive) heart failure: Secondary | ICD-10-CM | POA: Diagnosis not present

## 2020-03-01 DIAGNOSIS — E871 Hypo-osmolality and hyponatremia: Secondary | ICD-10-CM | POA: Diagnosis not present

## 2020-03-01 NOTE — Progress Notes (Signed)
Reviewed reports from rehab facility. Weight up 3-4 lb since admission (167 lb at hospital DC, 168 lb at rehab admission, now 171 lb). Has mild ankle edema. Creat up slightly at 2.2 and K high at 5.6.  Recommend: - increase torsemide to 60 mg daily (give additional 20 mg today on top of 40 mg already administered) - recheck labs (have been ordered for Sunday) - continue daily weights.

## 2020-03-03 ENCOUNTER — Emergency Department (HOSPITAL_COMMUNITY): Payer: Medicare PPO

## 2020-03-03 ENCOUNTER — Encounter (HOSPITAL_COMMUNITY): Payer: Self-pay | Admitting: Emergency Medicine

## 2020-03-03 ENCOUNTER — Other Ambulatory Visit: Payer: Self-pay

## 2020-03-03 ENCOUNTER — Inpatient Hospital Stay (HOSPITAL_COMMUNITY)
Admission: EM | Admit: 2020-03-03 | Discharge: 2020-03-07 | DRG: 291 | Disposition: A | Discharge status: Home Health | Payer: Medicare PPO | Attending: Internal Medicine | Admitting: Internal Medicine

## 2020-03-03 DIAGNOSIS — R531 Weakness: Secondary | ICD-10-CM | POA: Diagnosis not present

## 2020-03-03 DIAGNOSIS — T467X5A Adverse effect of peripheral vasodilators, initial encounter: Secondary | ICD-10-CM | POA: Diagnosis present

## 2020-03-03 DIAGNOSIS — I071 Rheumatic tricuspid insufficiency: Secondary | ICD-10-CM | POA: Diagnosis present

## 2020-03-03 DIAGNOSIS — N179 Acute kidney failure, unspecified: Secondary | ICD-10-CM | POA: Diagnosis present

## 2020-03-03 DIAGNOSIS — I509 Heart failure, unspecified: Secondary | ICD-10-CM | POA: Diagnosis not present

## 2020-03-03 DIAGNOSIS — I5023 Acute on chronic systolic (congestive) heart failure: Secondary | ICD-10-CM | POA: Diagnosis present

## 2020-03-03 DIAGNOSIS — N184 Chronic kidney disease, stage 4 (severe): Secondary | ICD-10-CM | POA: Diagnosis present

## 2020-03-03 DIAGNOSIS — J9601 Acute respiratory failure with hypoxia: Secondary | ICD-10-CM | POA: Diagnosis not present

## 2020-03-03 DIAGNOSIS — E1122 Type 2 diabetes mellitus with diabetic chronic kidney disease: Secondary | ICD-10-CM | POA: Diagnosis present

## 2020-03-03 DIAGNOSIS — Z20822 Contact with and (suspected) exposure to covid-19: Secondary | ICD-10-CM | POA: Diagnosis present

## 2020-03-03 DIAGNOSIS — I2721 Secondary pulmonary arterial hypertension: Secondary | ICD-10-CM | POA: Diagnosis present

## 2020-03-03 DIAGNOSIS — E1165 Type 2 diabetes mellitus with hyperglycemia: Secondary | ICD-10-CM | POA: Diagnosis not present

## 2020-03-03 DIAGNOSIS — I130 Hypertensive heart and chronic kidney disease with heart failure and stage 1 through stage 4 chronic kidney disease, or unspecified chronic kidney disease: Principal | ICD-10-CM | POA: Diagnosis present

## 2020-03-03 DIAGNOSIS — I10 Essential (primary) hypertension: Secondary | ICD-10-CM | POA: Diagnosis not present

## 2020-03-03 DIAGNOSIS — I5089 Other heart failure: Secondary | ICD-10-CM | POA: Diagnosis not present

## 2020-03-03 DIAGNOSIS — Z9989 Dependence on other enabling machines and devices: Secondary | ICD-10-CM

## 2020-03-03 DIAGNOSIS — I442 Atrioventricular block, complete: Secondary | ICD-10-CM | POA: Diagnosis present

## 2020-03-03 DIAGNOSIS — Z79899 Other long term (current) drug therapy: Secondary | ICD-10-CM

## 2020-03-03 DIAGNOSIS — I2723 Pulmonary hypertension due to lung diseases and hypoxia: Secondary | ICD-10-CM | POA: Diagnosis present

## 2020-03-03 DIAGNOSIS — R0689 Other abnormalities of breathing: Secondary | ICD-10-CM | POA: Diagnosis not present

## 2020-03-03 DIAGNOSIS — E7800 Pure hypercholesterolemia, unspecified: Secondary | ICD-10-CM | POA: Diagnosis present

## 2020-03-03 DIAGNOSIS — I2510 Atherosclerotic heart disease of native coronary artery without angina pectoris: Secondary | ICD-10-CM | POA: Diagnosis present

## 2020-03-03 DIAGNOSIS — D631 Anemia in chronic kidney disease: Secondary | ICD-10-CM | POA: Diagnosis present

## 2020-03-03 DIAGNOSIS — R0602 Shortness of breath: Secondary | ICD-10-CM | POA: Diagnosis not present

## 2020-03-03 DIAGNOSIS — M7989 Other specified soft tissue disorders: Secondary | ICD-10-CM | POA: Diagnosis present

## 2020-03-03 DIAGNOSIS — Z95810 Presence of automatic (implantable) cardiac defibrillator: Secondary | ICD-10-CM | POA: Diagnosis present

## 2020-03-03 DIAGNOSIS — I517 Cardiomegaly: Secondary | ICD-10-CM | POA: Diagnosis not present

## 2020-03-03 DIAGNOSIS — J9611 Chronic respiratory failure with hypoxia: Secondary | ICD-10-CM

## 2020-03-03 DIAGNOSIS — Z85048 Personal history of other malignant neoplasm of rectum, rectosigmoid junction, and anus: Secondary | ICD-10-CM

## 2020-03-03 DIAGNOSIS — J9621 Acute and chronic respiratory failure with hypoxia: Secondary | ICD-10-CM | POA: Diagnosis present

## 2020-03-03 DIAGNOSIS — D638 Anemia in other chronic diseases classified elsewhere: Secondary | ICD-10-CM | POA: Diagnosis not present

## 2020-03-03 DIAGNOSIS — E1142 Type 2 diabetes mellitus with diabetic polyneuropathy: Secondary | ICD-10-CM | POA: Diagnosis present

## 2020-03-03 DIAGNOSIS — N183 Chronic kidney disease, stage 3 unspecified: Secondary | ICD-10-CM | POA: Diagnosis not present

## 2020-03-03 DIAGNOSIS — I5082 Biventricular heart failure: Secondary | ICD-10-CM | POA: Diagnosis present

## 2020-03-03 DIAGNOSIS — Z8673 Personal history of transient ischemic attack (TIA), and cerebral infarction without residual deficits: Secondary | ICD-10-CM

## 2020-03-03 DIAGNOSIS — I952 Hypotension due to drugs: Secondary | ICD-10-CM | POA: Diagnosis present

## 2020-03-03 DIAGNOSIS — I2781 Cor pulmonale (chronic): Secondary | ICD-10-CM | POA: Diagnosis present

## 2020-03-03 DIAGNOSIS — Z7951 Long term (current) use of inhaled steroids: Secondary | ICD-10-CM

## 2020-03-03 DIAGNOSIS — J449 Chronic obstructive pulmonary disease, unspecified: Secondary | ICD-10-CM | POA: Diagnosis present

## 2020-03-03 DIAGNOSIS — I959 Hypotension, unspecified: Secondary | ICD-10-CM | POA: Diagnosis present

## 2020-03-03 DIAGNOSIS — I5021 Acute systolic (congestive) heart failure: Secondary | ICD-10-CM | POA: Diagnosis not present

## 2020-03-03 DIAGNOSIS — I428 Other cardiomyopathies: Secondary | ICD-10-CM | POA: Diagnosis not present

## 2020-03-03 DIAGNOSIS — E785 Hyperlipidemia, unspecified: Secondary | ICD-10-CM | POA: Diagnosis present

## 2020-03-03 DIAGNOSIS — E119 Type 2 diabetes mellitus without complications: Secondary | ICD-10-CM | POA: Diagnosis not present

## 2020-03-03 DIAGNOSIS — Z9851 Tubal ligation status: Secondary | ICD-10-CM

## 2020-03-03 DIAGNOSIS — R0609 Other forms of dyspnea: Secondary | ICD-10-CM

## 2020-03-03 DIAGNOSIS — Z87891 Personal history of nicotine dependence: Secondary | ICD-10-CM

## 2020-03-03 DIAGNOSIS — Z9981 Dependence on supplemental oxygen: Secondary | ICD-10-CM

## 2020-03-03 DIAGNOSIS — G4733 Obstructive sleep apnea (adult) (pediatric): Secondary | ICD-10-CM | POA: Diagnosis present

## 2020-03-03 DIAGNOSIS — I110 Hypertensive heart disease with heart failure: Secondary | ICD-10-CM | POA: Diagnosis not present

## 2020-03-03 DIAGNOSIS — Z888 Allergy status to other drugs, medicaments and biological substances status: Secondary | ICD-10-CM

## 2020-03-03 DIAGNOSIS — R0600 Dyspnea, unspecified: Secondary | ICD-10-CM | POA: Diagnosis present

## 2020-03-03 DIAGNOSIS — I272 Pulmonary hypertension, unspecified: Secondary | ICD-10-CM

## 2020-03-03 DIAGNOSIS — R0902 Hypoxemia: Secondary | ICD-10-CM | POA: Diagnosis not present

## 2020-03-03 DIAGNOSIS — Z7982 Long term (current) use of aspirin: Secondary | ICD-10-CM

## 2020-03-03 DIAGNOSIS — J984 Other disorders of lung: Secondary | ICD-10-CM | POA: Diagnosis present

## 2020-03-03 HISTORY — DX: Dyspnea, unspecified: R06.00

## 2020-03-03 LAB — CBC
HCT: 31.8 % — ABNORMAL LOW (ref 36.0–46.0)
Hemoglobin: 9.4 g/dL — ABNORMAL LOW (ref 12.0–15.0)
MCH: 26.9 pg (ref 26.0–34.0)
MCHC: 29.6 g/dL — ABNORMAL LOW (ref 30.0–36.0)
MCV: 90.9 fL (ref 80.0–100.0)
Platelets: 226 10*3/uL (ref 150–400)
RBC: 3.5 MIL/uL — ABNORMAL LOW (ref 3.87–5.11)
RDW: 16.7 % — ABNORMAL HIGH (ref 11.5–15.5)
WBC: 4 10*3/uL (ref 4.0–10.5)
nRBC: 0 % (ref 0.0–0.2)

## 2020-03-03 LAB — BASIC METABOLIC PANEL
Anion gap: 12 (ref 5–15)
BUN: 58 mg/dL — ABNORMAL HIGH (ref 8–23)
CO2: 26 mmol/L (ref 22–32)
Calcium: 9.1 mg/dL (ref 8.9–10.3)
Chloride: 97 mmol/L — ABNORMAL LOW (ref 98–111)
Creatinine, Ser: 3.42 mg/dL — ABNORMAL HIGH (ref 0.44–1.00)
GFR calc Af Amer: 14 mL/min — ABNORMAL LOW (ref >60)
GFR calc non Af Amer: 12 mL/min — ABNORMAL LOW (ref >60)
Glucose, Bld: 200 mg/dL — ABNORMAL HIGH (ref 70–99)
Potassium: 4.8 mmol/L (ref 3.5–5.1)
Sodium: 135 mmol/L (ref 135–145)

## 2020-03-03 NOTE — Progress Notes (Signed)
Notified that patient brought to ER by EMS. Not yet roomed. Please contact cardiology on call to assess the patient when she receives an ER room assignment. Notified on call cardiology provider Dr. Kalman Shan.  Buford Dresser, MD, PhD [facility]  [address], [place],  [address] [phone]

## 2020-03-03 NOTE — ED Triage Notes (Signed)
Pt BIB [facility] from [facility], c/o shortness of breath x 2 days and weight gain. Hx CHF. Pt denies chest pain, normally wears 2L [place], has had to increase to 6L. EMS reports pt has also had issues with hypotension x weeks.

## 2020-03-04 ENCOUNTER — Inpatient Hospital Stay (HOSPITAL_COMMUNITY): Payer: Medicare PPO

## 2020-03-04 ENCOUNTER — Telehealth: Payer: Self-pay | Admitting: Pulmonary Disease

## 2020-03-04 DIAGNOSIS — I2510 Atherosclerotic heart disease of native coronary artery without angina pectoris: Secondary | ICD-10-CM | POA: Diagnosis present

## 2020-03-04 DIAGNOSIS — I442 Atrioventricular block, complete: Secondary | ICD-10-CM | POA: Diagnosis present

## 2020-03-04 DIAGNOSIS — I5021 Acute systolic (congestive) heart failure: Secondary | ICD-10-CM | POA: Diagnosis not present

## 2020-03-04 DIAGNOSIS — Z95810 Presence of automatic (implantable) cardiac defibrillator: Secondary | ICD-10-CM

## 2020-03-04 DIAGNOSIS — I5023 Acute on chronic systolic (congestive) heart failure: Secondary | ICD-10-CM | POA: Diagnosis present

## 2020-03-04 DIAGNOSIS — E119 Type 2 diabetes mellitus without complications: Secondary | ICD-10-CM

## 2020-03-04 DIAGNOSIS — T467X5A Adverse effect of peripheral vasodilators, initial encounter: Secondary | ICD-10-CM | POA: Diagnosis present

## 2020-03-04 DIAGNOSIS — I959 Hypotension, unspecified: Secondary | ICD-10-CM

## 2020-03-04 DIAGNOSIS — Z9989 Dependence on other enabling machines and devices: Secondary | ICD-10-CM

## 2020-03-04 DIAGNOSIS — E1142 Type 2 diabetes mellitus with diabetic polyneuropathy: Secondary | ICD-10-CM | POA: Diagnosis present

## 2020-03-04 DIAGNOSIS — G4733 Obstructive sleep apnea (adult) (pediatric): Secondary | ICD-10-CM

## 2020-03-04 DIAGNOSIS — I2723 Pulmonary hypertension due to lung diseases and hypoxia: Secondary | ICD-10-CM | POA: Diagnosis present

## 2020-03-04 DIAGNOSIS — I2781 Cor pulmonale (chronic): Secondary | ICD-10-CM | POA: Diagnosis present

## 2020-03-04 DIAGNOSIS — N184 Chronic kidney disease, stage 4 (severe): Secondary | ICD-10-CM | POA: Diagnosis present

## 2020-03-04 DIAGNOSIS — E7800 Pure hypercholesterolemia, unspecified: Secondary | ICD-10-CM

## 2020-03-04 DIAGNOSIS — Z20822 Contact with and (suspected) exposure to covid-19: Secondary | ICD-10-CM | POA: Diagnosis present

## 2020-03-04 DIAGNOSIS — I952 Hypotension due to drugs: Secondary | ICD-10-CM | POA: Diagnosis present

## 2020-03-04 DIAGNOSIS — D631 Anemia in chronic kidney disease: Secondary | ICD-10-CM | POA: Diagnosis present

## 2020-03-04 DIAGNOSIS — E785 Hyperlipidemia, unspecified: Secondary | ICD-10-CM | POA: Diagnosis present

## 2020-03-04 DIAGNOSIS — J449 Chronic obstructive pulmonary disease, unspecified: Secondary | ICD-10-CM | POA: Diagnosis present

## 2020-03-04 DIAGNOSIS — R0600 Dyspnea, unspecified: Secondary | ICD-10-CM | POA: Diagnosis present

## 2020-03-04 DIAGNOSIS — M7989 Other specified soft tissue disorders: Secondary | ICD-10-CM | POA: Diagnosis present

## 2020-03-04 DIAGNOSIS — J9621 Acute and chronic respiratory failure with hypoxia: Secondary | ICD-10-CM | POA: Diagnosis present

## 2020-03-04 DIAGNOSIS — I2721 Secondary pulmonary arterial hypertension: Secondary | ICD-10-CM

## 2020-03-04 DIAGNOSIS — J984 Other disorders of lung: Secondary | ICD-10-CM | POA: Diagnosis present

## 2020-03-04 DIAGNOSIS — N179 Acute kidney failure, unspecified: Secondary | ICD-10-CM | POA: Diagnosis present

## 2020-03-04 DIAGNOSIS — I5082 Biventricular heart failure: Secondary | ICD-10-CM | POA: Diagnosis present

## 2020-03-04 DIAGNOSIS — I071 Rheumatic tricuspid insufficiency: Secondary | ICD-10-CM | POA: Diagnosis present

## 2020-03-04 DIAGNOSIS — E1122 Type 2 diabetes mellitus with diabetic chronic kidney disease: Secondary | ICD-10-CM | POA: Diagnosis present

## 2020-03-04 DIAGNOSIS — I509 Heart failure, unspecified: Secondary | ICD-10-CM | POA: Diagnosis not present

## 2020-03-04 DIAGNOSIS — I428 Other cardiomyopathies: Secondary | ICD-10-CM | POA: Diagnosis not present

## 2020-03-04 DIAGNOSIS — I130 Hypertensive heart and chronic kidney disease with heart failure and stage 1 through stage 4 chronic kidney disease, or unspecified chronic kidney disease: Secondary | ICD-10-CM | POA: Diagnosis present

## 2020-03-04 HISTORY — DX: Hypotension, unspecified: I95.9

## 2020-03-04 LAB — URINALYSIS, ROUTINE W REFLEX MICROSCOPIC
Bilirubin Urine: NEGATIVE
Glucose, UA: NEGATIVE mg/dL
Hgb urine dipstick: NEGATIVE
Ketones, ur: NEGATIVE mg/dL
Leukocytes,Ua: NEGATIVE
Nitrite: NEGATIVE
Protein, ur: NEGATIVE mg/dL
Specific Gravity, Urine: 1.009 (ref 1.005–1.030)
pH: 5 (ref 5.0–8.0)

## 2020-03-04 LAB — HEPATIC FUNCTION PANEL
ALT: 15 U/L (ref 0–44)
AST: 16 U/L (ref 15–41)
Albumin: 3.7 g/dL (ref 3.5–5.0)
Alkaline Phosphatase: 109 U/L (ref 38–126)
Bilirubin, Direct: 0.2 mg/dL (ref 0.0–0.2)
Indirect Bilirubin: 0.8 mg/dL (ref 0.3–0.9)
Total Bilirubin: 1 mg/dL (ref 0.3–1.2)
Total Protein: 7.1 g/dL (ref 6.5–8.1)

## 2020-03-04 LAB — BRAIN NATRIURETIC PEPTIDE: B Natriuretic Peptide: 466.5 pg/mL — ABNORMAL HIGH (ref 0.0–100.0)

## 2020-03-04 LAB — CBG MONITORING, ED: Glucose-Capillary: 176 mg/dL — ABNORMAL HIGH (ref 70–99)

## 2020-03-04 LAB — CREATININE, URINE, RANDOM: Creatinine, Urine: 103.34 mg/dL

## 2020-03-04 LAB — SARS CORONAVIRUS 2 BY RT PCR (HOSPITAL ORDER, PERFORMED IN ~~LOC~~ HOSPITAL LAB): SARS Coronavirus 2: NEGATIVE

## 2020-03-04 LAB — PHOSPHORUS: Phosphorus: 5.8 mg/dL — ABNORMAL HIGH (ref 2.5–4.6)

## 2020-03-04 LAB — MAGNESIUM: Magnesium: 2.5 mg/dL — ABNORMAL HIGH (ref 1.7–2.4)

## 2020-03-04 LAB — SODIUM, URINE, RANDOM: Sodium, Ur: 38 mmol/L

## 2020-03-04 MED ORDER — ATORVASTATIN CALCIUM 40 MG PO TABS
40.0000 mg | ORAL_TABLET | Freq: Every day | ORAL | Status: DC
  Administered 2020-03-04 – 2020-03-06 (×3): 40 mg via ORAL
  Filled 2020-03-04 (×3): qty 1

## 2020-03-04 MED ORDER — BUDESONIDE 0.5 MG/2ML IN SUSP
0.5000 mg | Freq: Two times a day (BID) | RESPIRATORY_TRACT | Status: DC
  Filled 2020-03-04 (×2): qty 2

## 2020-03-04 MED ORDER — ACETAMINOPHEN 325 MG PO TABS
650.0000 mg | ORAL_TABLET | ORAL | Status: DC | PRN
  Administered 2020-03-05 (×2): 650 mg via ORAL
  Filled 2020-03-04 (×2): qty 2

## 2020-03-04 MED ORDER — SODIUM CHLORIDE 0.9% FLUSH: 3.0000 mL | INTRAVENOUS | Status: DC | PRN

## 2020-03-04 MED ORDER — FUROSEMIDE 10 MG/ML IJ SOLN
60.0000 mg | Freq: Once | INTRAMUSCULAR | Status: AC
  Administered 2020-03-04: 60 mg via INTRAVENOUS
  Filled 2020-03-04: qty 8

## 2020-03-04 MED ORDER — ONDANSETRON HCL 4 MG/2ML IJ SOLN: 4.0000 mg | Freq: Four times a day (QID) | INTRAMUSCULAR | Status: DC | PRN

## 2020-03-04 MED ORDER — SODIUM CHLORIDE 0.9 % IV SOLN: 250.0000 mL | INTRAVENOUS | Status: DC | PRN

## 2020-03-04 MED ORDER — SODIUM CHLORIDE 0.9% FLUSH
3.0000 mL | Freq: Two times a day (BID) | INTRAVENOUS | Status: DC
  Administered 2020-03-04 – 2020-03-07 (×6): 3 mL via INTRAVENOUS

## 2020-03-04 MED ORDER — HEPARIN SODIUM (PORCINE) 5000 UNIT/ML IJ SOLN
5000.0000 [IU] | Freq: Three times a day (TID) | INTRAMUSCULAR | Status: DC
  Administered 2020-03-04 – 2020-03-07 (×9): 5000 [IU] via SUBCUTANEOUS
  Filled 2020-03-04 (×9): qty 1

## 2020-03-04 MED ORDER — BUDESONIDE 0.5 MG/2ML IN SUSP
0.5000 mg | Freq: Two times a day (BID) | RESPIRATORY_TRACT | Status: DC
  Administered 2020-03-04 – 2020-03-07 (×6): 0.5 mg via RESPIRATORY_TRACT
  Filled 2020-03-04 (×7): qty 2

## 2020-03-04 MED ORDER — INSULIN ASPART 100 UNIT/ML ~~LOC~~ SOLN
0.0000 [IU] | Freq: Three times a day (TID) | SUBCUTANEOUS | Status: DC
  Administered 2020-03-04 – 2020-03-06 (×4): 1 [IU] via SUBCUTANEOUS

## 2020-03-04 MED ORDER — ASPIRIN EC 81 MG PO TBEC
81.0000 mg | DELAYED_RELEASE_TABLET | Freq: Every day | ORAL | Status: DC
  Administered 2020-03-04 – 2020-03-06 (×3): 81 mg via ORAL
  Filled 2020-03-04 (×3): qty 1

## 2020-03-04 MED ORDER — FUROSEMIDE 10 MG/ML IJ SOLN
80.0000 mg | Freq: Two times a day (BID) | INTRAMUSCULAR | Status: DC
  Administered 2020-03-05 (×2): 80 mg via INTRAVENOUS
  Filled 2020-03-04 (×4): qty 8

## 2020-03-04 MED ORDER — IPRATROPIUM-ALBUTEROL 0.5-2.5 (3) MG/3ML IN SOLN: 3.0000 mL | Freq: Four times a day (QID) | RESPIRATORY_TRACT | Status: DC | PRN

## 2020-03-04 NOTE — Consult Note (Signed)
Cardiology Consultation:   Patient ID: [name] MRN: [id number]; DOB: June 09, 1941  Admit date: 03/03/2020 Date of Consult: 03/04/2020  Primary Care Provider: Girtha Rm, NP-C [facility] Cardiologist: Sanda Klein, MD  [facility] Electrophysiologist:  None    Patient Profile:   [name] is a 79 y.o. female with a hx of HTN, COPD, systolic CHF w/ biventricular dysfunction, severe PAH, nonobstructive CAD, DM2, CKD stage IV followed by Dr. [name], OSA on CPAP who is being seen today for the evaluation of acute CHF at the request of Dr. Tamala Julian.   History of Present Illness:   [name] is followed by Dr. [name] for the above cardiac issues. The patient has a long standing history of chronic systolic heart failure/NICM and pulmonary hypertension. Patient had mild nonobstructive CAD by [facility] in 2006 and again in 2015. She has a s/p Medtronic ICD, also w/ restrictive lung disease followed by Dr. Halford Chessman, OSA on CPAP, essential htn, CKD, HLD, and T2DM. Echo in 2016 showed LVEF 45% w/ mild AS, normal RV function but severe pulmonary HTN, PASP 70.   The patient was admitted 02/11/20-02/21/20 for acute on chronic hypoxic respiratory failure and acute on chronic systolic HF. BNP 617. D-dimer elevated but V/Q scan negative for PE. Echo showed LVEF 45-50%, w/ global hypokinesis. RV severely enlarged w/ moderately elevated pulmonary artery systolic pressure, with mod-TR. RHC showed severe PAH w/ normal left-sided pressures. CO/CI normal. Patient was seen by [facility] team who recommended home O2 and sildenafil 20 mg TID was started. Lasix was changed to torsemide 40 mg in the AM and 7m in the PM. The patient was discharged to rehab.   The patient presented to the ED 03/04/20 for shortness of breath. Patient reported progressive lower leg edema and sob for the last 4-5 days despite increase in torsemide. At the rehab center she was not on a low salt diet. Patient reports orthopnea. She is on  CPAP at night and 2L O2. Reported weight up from baseline 160lbs to 174lbs. Her daughter was concerned patient was not receiving sildenafil as prescribed. Denied fever, chills, recent illness.   In the ED BP 102/47, pulse 59, afebrile, RR 18, 96%. Labs showed potassium 4.8, glucose 200, creatinine 3.42, WBC 4.0, Hgb 9.4. BNP 466. COVID negative. EKG shows a-paced rhythm. CXR with cardiomegaly with minimal vascular congestion.    Past Medical History:  Diagnosis Date  . Anemia   . CAD (coronary artery disease)   . Cardiac defibrillator in situ   . Cataract    bilateral 2017  . CHF (congestive heart failure) ([facility]   . Chronic systolic heart failure ([facility]   . CKD (chronic kidney disease) stage 4, GFR 15-29 ml/min (HCC)   . COPD (chronic obstructive pulmonary disease) ([facility]   . HTN (hypertension)   . Malignant neoplasm of rectum ([facility] 09/08/2017   Dr. [place] notes she was due for colonoscopy in 12/2016  . OSA on CPAP 09/08/2017  . Other cardiomyopathies ([facility]   . Oxygen deficiency    2 liters at night  . Pulmonary HTN ([facility]   . Sleep apnea    uses c-pap  . Stroke ([facility]   . Type 2 diabetes, controlled, with peripheral neuropathy ([facility]   . Urge incontinence of urine     Past Surgical History:  Procedure Laterality Date  . BIOPSY  03/29/2018   Procedure: BIOPSY;  Surgeon: AYetta Flock MD;  Location: [facility];  Service: Gastroenterology;;  .  COLONOSCOPY    . COLONOSCOPY WITH PROPOFOL N/A 03/29/2018   Procedure: COLONOSCOPY WITH PROPOFOL;  Surgeon: Yetta Flock, MD;  Location: [facility];  Service: Gastroenterology;  Laterality: N/A;  . ESOPHAGOGASTRODUODENOSCOPY (EGD) WITH PROPOFOL N/A 03/29/2018   Procedure: ESOPHAGOGASTRODUODENOSCOPY (EGD) WITH PROPOFOL;  Surgeon: Yetta Flock, MD;  Location: [facility];  Service: Gastroenterology;  Laterality: N/A;  . POLYPECTOMY  03/29/2018   Procedure: POLYPECTOMY;  Surgeon: Yetta Flock, MD;   Location: [facility];  Service: Gastroenterology;;  . RIGHT HEART CATH N/A 02/15/2020   Procedure: RIGHT HEART CATH;  Surgeon: Jolaine Artist, MD;  Location: [facility];  Service: Cardiovascular;  Laterality: N/A;  . TONSILLECTOMY AND ADENOIDECTOMY     age 78  . TUBAL LIGATION     years ago  . UPPER GASTROINTESTINAL ENDOSCOPY    . UPPER GI ENDOSCOPY  11/28/2009   mild erosive esophagitis, mild erosive changes in stomach, chronic constipation     Home Medications:  Prior to Admission medications   Medication Sig Start Date End Date Taking? Authorizing Provider  ACCU-CHEK AVIVA PLUS test strip USE  TO TEST BLOOD SUGAR ONE TIME DAILY Patient taking differently: 1 each by Other route as needed (To test blood sugar).  02/08/20  Yes Henson, Vickie L, NP-C  Accu-Chek Softclix Lancets lancets USE  AS  INSTRUCTED Patient taking differently: 1 each by Other route as needed.  06/26/19  Yes Henson, Vickie L, NP-C  albuterol (VENTOLIN HFA) 108 (90 Base) MCG/ACT inhaler Inhale 2 puffs into the lungs every 6 (six) hours as needed for wheezing or shortness of breath. 02/21/20  Yes Shelly Coss, MD  Alcohol Swabs (B-D SINGLE USE SWABS REGULAR) PADS USE  TO TEST BLOOD SUGAR ONE TIME DAILY Patient taking differently: 1 each by Other route as needed (Clean skin before blood sugar reading).  07/27/19  Yes Henson, Vickie L, NP-C  amLODipine (NORVASC) 10 MG tablet TAKE 1 TABLET EVERY DAY Patient taking differently: Take 10 mg by mouth daily.  01/16/20  Yes Henson, Vickie L, NP-C  aspirin 81 MG tablet Take 81 mg by mouth at bedtime.    Yes [provider]  atorvastatin (LIPITOR) 40 MG tablet TAKE 1 TABLET EVERY DAY Patient taking differently: Take 40 mg by mouth daily.  01/16/20  Yes Henson, Vickie L, NP-C  budesonide (PULMICORT) 0.5 MG/2ML nebulizer solution Take 2 mLs (0.5 mg total) by nebulization 2 (two) times daily. 02/21/20  Yes Shelly Coss, MD  carvedilol (COREG) 25 MG tablet TAKE 1  TABLET TWICE DAILY WITH A MEAL Patient taking differently: Take 25 mg by mouth 2 (two) times daily with a meal.  01/24/19  Yes Henson, Vickie L, NP-C  cholecalciferol (VITAMIN D3) 25 MCG (1000 UNIT) tablet Take 1,000 Units by mouth daily.   Yes [provider]  ferrous sulfate 325 (65 FE) MG tablet Take 325 mg by mouth daily.    Yes [provider]  Ipratropium-Albuterol (COMBIVENT RESPIMAT) 20-100 MCG/ACT AERS respimat Inhale 1 puff into the lungs in the morning, at noon, and at bedtime.   Yes [provider]  ipratropium-albuterol (DUONEB) 0.5-2.5 (3) MG/3ML SOLN Take 3 mLs by nebulization every 6 (six) hours as needed. Patient taking differently: Take 3 mLs by nebulization every 6 (six) hours as needed (wheezing, SOB).  02/21/20  Yes Shelly Coss, MD  MYRBETRIQ 50 MG TB24 tablet TAKE 1 TABLET EVERY DAY Patient taking differently: Take 50 mg by mouth daily as needed (overactive bladder).  10/25/19  Yes Henson, Vickie L, NP-C  OXYGEN Inhale 2 L into the lungs continuous.    Yes [provider]  polyethylene glycol (MIRALAX / GLYCOLAX) 17 g packet Take 17 g by mouth daily as needed for moderate constipation. 02/21/20  Yes Adhikari, Tamsen Meek, MD  potassium chloride SA (KLOR-CON) 20 MEQ tablet Take 1 tablet (20 mEq total) by mouth daily. 02/22/20  Yes Shelly Coss, MD  sildenafil (REVATIO) 20 MG tablet Take 1 tablet (20 mg total) by mouth 3 (three) times daily. 02/21/20  Yes Shelly Coss, MD  torsemide (DEMADEX) 20 MG tablet Take 2 tablets (40 mg total) by mouth daily. Patient taking differently: Take 60 mg by mouth daily.  02/21/20  Yes Shelly Coss, MD    Inpatient Medications: Scheduled Meds: . aspirin EC  81 mg Oral QHS  . atorvastatin  40 mg Oral Daily  . budesonide  0.5 mg Nebulization BID  . furosemide  60 mg Intravenous Once  . heparin  5,000 Units Subcutaneous Q8H  . insulin aspart  0-6 Units Subcutaneous TID WC  . sodium chloride flush  3 mL Intravenous  Q12H   Continuous Infusions: . sodium chloride     PRN Meds: sodium chloride, acetaminophen, ipratropium-albuterol, ondansetron (ZOFRAN) IV, sodium chloride flush  Allergies:    Allergies  Allergen Reactions  . Benazepril Hcl Other (See Comments)    Unknown  . Lotrel [Amlodipine Besy-Benazepril Hcl] Other (See Comments)    Hypotension   . Talwin [Pentazocine] Other (See Comments)    Hallucinations      Social History:   Social History   Socioeconomic History  . Marital status: Widowed    Spouse name: Not on file  . Number of children: Not on file  . Years of education: Not on file  . Highest education level: Not on file  Occupational History  . Not on file  Tobacco Use  . Smoking status: Former Smoker    Quit date: 03/14/2009    Years since quitting: 10.9  . Smokeless tobacco: Never Used  . Tobacco comment: quit smoking 2010  Substance and Sexual Activity  . Alcohol use: No  . Drug use: No  . Sexual activity: Not on file  Other Topics Concern  . Not on file  Social History Narrative   Lives alone.  2 daughters and 1 granddaughter in [place]   Social Determinants of Health   Financial Resource Strain:   . Difficulty of Paying Living Expenses:   Food Insecurity:   . Worried About Charity fundraiser in the Last Year:   . Arboriculturist in the Last Year:   Transportation Needs:   . Film/video editor (Medical):   [name] Lack of Transportation (Non-Medical):   Physical Activity:   . Days of Exercise per Week:   . Minutes of Exercise per Session:   Stress:   . Feeling of Stress :   Social Connections:   . Frequency of Communication with Friends and Family:   . Frequency of Social Gatherings with Friends and Family:   . Attends Religious Services:   . Active Member of Clubs or Organizations:   . Attends Archivist Meetings:   [name] Marital Status:   Intimate Partner Violence:   . Fear of Current or Ex-Partner:   . Emotionally Abused:   [name] Physically  Abused:   . Sexually Abused:     Family History:   Family History  Problem Relation Age of Onset  .  Colon polyps Daughter   . Colon cancer Neg Hx   . Esophageal cancer Neg Hx   . Rectal cancer Neg Hx   . Stomach cancer Neg Hx      ROS:  Please see the history of present illness.  All other ROS reviewed and negative.     Physical Exam/Data:   Vitals:   03/04/20 1233 03/04/20 1234 03/04/20 1235 03/04/20 1415  BP:    (!) 118/55  Pulse: 60 64 (!) 58 (!) 58  Resp:    (!) 21  Temp:      TempSrc:      SpO2: 100% 95% 96% 97%   No intake or output data in the 24 hours ending 03/04/20 1422 Last 3 Weights 02/22/2020 02/21/2020 02/20/2020  Weight (lbs) 167 lb 167 lb 167 lb 1.7 oz  Weight (kg) 75.751 kg 75.751 kg 75.8 kg     There is no height or weight on file to calculate BMI.  General:  Well nourished, well developed, in no acute distress HEENT: normal Lymph: no adenopathy Neck: + JVD Endocrine:  No thryomegaly Vascular: No carotid bruits; FA pulses 2+ bilaterally without bruits  Cardiac:  normal S1, S2; RRR; no murmur  Lungs:  Minimal crackles at bases Abd: soft, nontender, no hepatomegaly  Ext: 2+ edema Musculoskeletal:  No deformities, BUE and BLE strength normal and equal Skin: warm and dry  Neuro:  CNs 2-12 intact, no focal abnormalities noted Psych:  Normal affect   EKG:  The EKG was personally reviewed and demonstrates:  A-paced rhythm, HR 60s   Relevant CV Studies:  Echo 02/12/20 1. Left ventricular ejection fraction, by estimation, is 45 to 50%. The  left ventricle has mildly decreased function. The left ventricle  demonstrates global hypokinesis. Left ventricular diastolic function could  not be evaluated. There is the  interventricular septum is flattened in systole and diastole, consistent  with right ventricular pressure and volume overload.  2. Right ventricular systolic function is severely reduced. The right  ventricular size is severely enlarged. There  is moderately elevated  pulmonary artery systolic pressure. The estimated right ventricular  systolic pressure is 52.1 mmHg.  3. Right atrial size was severely dilated.  4. The mitral valve is grossly normal. Mild mitral valve regurgitation.  No evidence of mitral stenosis.  5. The tricuspid valve is abnormal. Tricuspid valve regurgitation is  moderate to severe.  6. The aortic valve is tricuspid. Aortic valve regurgitation is not  visualized. No aortic stenosis is present.  7. The inferior vena cava is normal in size with greater than 50%  respiratory variability, suggesting right atrial pressure of 3 mmHg.   Comparison(s): No prior Echocardiogram. Unable to view prior  echocardiogram.   RH cath 02/15/20 RA = 20 RV = 72/27 PA = 79/44 (53) PCW = 18 Fick cardiac output/index = 5.35/2.9 Thermo CO/CI = 4.4/2.4 PVR = 6.5 (Fick) 8.0 (Thermo) Ao sat = 94% PA sat = 64%, 64% SVC sat = 61%  LHC 06/2014 Left main: LMCA with mild plaqing and no obstructive CAD LAD: tortuous but essentially normal LCx: mild plaquing and no obstructive CAD RCA: essentially normal LVEF 35-40%  Assessment: 1. Severe PAH with normal left-sided pressures  Plan/Discussion:  There is near equalization or diastolic pressures which can reflect restrictive physiology or elevated R-sided pressure with normal left pressures. Given relatively normal PCWP - restriction felt to be less likely.   Glori Bickers, MD  5:36 PM     Laboratory Data:  High Sensitivity Troponin:   Recent Labs  Lab 02/11/20 2014 02/11/20 2247  TROPONINIHS 17 18*     Chemistry Recent Labs  Lab 03/03/20 1718  NA 135  K 4.8  CL 97*  CO2 26  GLUCOSE 200*  BUN 58*  CREATININE 3.42*  CALCIUM 9.1  GFRNONAA 12*  GFRAA 14*  ANIONGAP 12    Recent Labs  Lab 03/04/20 1010  PROT 7.1  ALBUMIN 3.7  AST 16  ALT 15  ALKPHOS 109  BILITOT 1.0   Hematology Recent Labs  Lab 03/03/20 1718  WBC 4.0  RBC 3.50*   HGB 9.4*  HCT 31.8*  MCV 90.9  MCH 26.9  MCHC 29.6*  RDW 16.7*  PLT 226   BNP Recent Labs  Lab 03/04/20 0940  BNP 466.5*    DDimer No results for input(s): DDIMER in the last 168 hours.   Radiology/Studies:  DG Chest 2 View  Result Date: 03/03/2020 CLINICAL DATA:  Shortness of breath and lower extremity edema. EXAM: CHEST - 2 VIEW COMPARISON:  02/19/2020 FINDINGS: Left-sided pacemaker unchanged. Lungs are somewhat hypoinflated without focal airspace consolidation or effusion. Subtle hazy prominence of the central perihilar markings likely mild vascular congestion. Stable moderate cardiomegaly. Remainder of the exam is unchanged. IMPRESSION: Cardiomegaly with suggestion of minimal vascular congestion. Electronically Signed   By: Marin Olp M.D.   On: 03/03/2020 17:31   {  Assessment and Plan:   Acute on chronic respiratory failure - multifactorial with PAH and CHF and restrictive lung disease - supplemental O2 at home - On 3L O2  Acute on chronic CHF with RV failure - long standing chronic systolic CHF/NICM and pulmonary hypertension - mild nonobstructive CAD by [facility] in 2006 and again in 2015 - s/p Medtronic device - Echo last admission showed EF 45-5-%, RV severely enlarged w/ moderately reduced systolic system - pta torsemide 60 mg daily for the last 3 days. Pt had gained about 10lbs of fluid since being in the rehab facility. Also recently started on sildenafil which was already held for hypotension  - BNP up to 466 and CXR with cardiomegaly and minimal vascular congestion - Patient is volume up on exam and needs diuresis. Strict I/Os, daily weights, and monitor creatinine with diuresis  Severe PAH - RHC last admission showed severe PAH and normal left sided pressures - sildenafil started during the last admission>>plan to hold  CKD stage 3-4 - creatinine baseline around 1.6 - on admission 3.4 - nephrology following  HTN - pta amlodipine 59m daily, Coreg 236m BID, sildenafil>>held for hypotension  HLD - atorvastatin  For questions or updates, please contact [facility] Please consult [email or web address] for contact info under    Signed, Rosalio Catterton H Ninfa MeekerPA-C  03/04/2020 2:22 PM

## 2020-03-04 NOTE — ED Provider Notes (Signed)
[facility] EMERGENCY DEPARTMENT Provider Note   CSN: [id number] Arrival date & time: 03/03/20  1637     History Chief Complaint  Patient presents with  . Shortness of Breath    Erin MACKOWIAK is a 79 y.o. female.  HPI   Patient presents to the ED for shortness of breath via EMS.  The patient is currently residing in rehabilitation where she was sent after a recent hospitalization for CHF exacerbation.  Since last week, the patient has had increasing shortness of breath, worsening over the last 2 days more specifically.  Patient endorses approximately 5 to 10 pound weight gain.  No chest pain.  Previous to the last hospitalization, the patient only wore oxygen at night while on CPAP however now has baseline O2 requirement of 2 L nasal cannula.  Patient had some hypoxia when EMS presented yesterday and O2 was titrated up to 6 L.  Patient denies any sick contacts.  No nausea, vomiting or diarrhea.  Does endorse lower extremity edema.  No urinary symptoms.  Patient has received COVID-19 vaccination x2.     Past Medical History:  Diagnosis Date  . Anemia   . CAD (coronary artery disease)   . Cardiac defibrillator in situ   . Cataract    bilateral 2017  . CHF (congestive heart failure) ([place])   . Chronic systolic heart failure ([place])   . CKD (chronic kidney disease) stage 4, GFR 15-29 ml/min (HCC)   . COPD (chronic obstructive pulmonary disease) ([place])   . HTN (hypertension)   . 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 Rollene Rotunda in [place]. Per notes she was due for colonoscopy in 12/2016  . OSA on CPAP 09/08/2017  . Other cardiomyopathies ([place])   . Oxygen deficiency    2 liters at night  . Pulmonary HTN ([place])   . Sleep apnea    uses c-pap  . Stroke ([place])   . 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 Urge incontinence of urine     Patient Active Problem List   Diagnosis Date Noted  . Acute on chronic systolic CHF (congestive  heart failure) ([place]) 03/04/2020  . Pulmonary hypertension, unspecified ([place])   . Acute on chronic combined systolic and diastolic CHF (congestive heart failure) ([place]) 02/12/2020  . Acute respiratory failure with hypoxia ([place]) 02/12/2020  . CHF exacerbation ([place]) 02/12/2020  . D dimer value normal   . Diabetes mellitus type 2, diet-controlled ([place])   . Abnormal result of cardiovascular function study, unspecified 09/16/2018  . Callus 09/16/2018  . Claudication of both lower extremities ([place]) 09/16/2018  . Dependence on other enabling machines and devices 09/16/2018  . Dizziness 09/16/2018  . Dyspnea, unspecified 09/16/2018  . Medicare annual wellness visit, subsequent 09/16/2018  . Onychomycosis due to dermatophyte 09/16/2018  . Overgrown toenails 09/16/2018  . Other secondary pulmonary hypertension ([place]) 09/16/2018  . Iron deficiency anemia   . On home O2   . Benign neoplasm of colon   . PAH (pulmonary artery hypertension) ([place]) 09/09/2017  . PAT (paroxysmal atrial tachycardia) ([place]) 09/09/2017  . Hypercholesterolemia 09/09/2017  . OSA on CPAP 09/08/2017  . 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 Other cardiomyopathies ([place])   . Chronic combined systolic (congestive) and diastolic (congestive) heart failure ([place])   . CKD (chronic kidney disease) stage 4, GFR 15-29 ml/min (HCC)   . ICD (implantable cardioverter-defibrillator) in  place   . Urge incontinence of urine   . Coronary artery disease involving native coronary artery of native heart without angina pectoris   . Chronic kidney disease 09/06/2017  . Restrictive lung disease 05/04/2017  . Body mass index (bmi) 33.0-33.9, adult 06/13/2014  . DEPRESSION 07/26/2007  . Essential hypertension 07/26/2007  . CARDIOMYOPATHY 07/26/2007  . COPD (chronic obstructive pulmonary disease) ([place]) 07/26/2007    Past Surgical History:  Procedure Laterality Date  .  BIOPSY  03/29/2018   Procedure: BIOPSY;  Surgeon: Yetta Flock, MD;  Location: [facility];  Service: Gastroenterology;;  . COLONOSCOPY    . COLONOSCOPY WITH PROPOFOL N/A 03/29/2018   Procedure: COLONOSCOPY WITH PROPOFOL;  Surgeon: Yetta Flock, MD;  Location: [facility];  Service: Gastroenterology;  Laterality: N/A;  . ESOPHAGOGASTRODUODENOSCOPY (EGD) WITH PROPOFOL N/A 03/29/2018   Procedure: ESOPHAGOGASTRODUODENOSCOPY (EGD) WITH PROPOFOL;  Surgeon: Yetta Flock, MD;  Location: [facility];  Service: Gastroenterology;  Laterality: N/A;  . POLYPECTOMY  03/29/2018   Procedure: POLYPECTOMY;  Surgeon: Yetta Flock, MD;  Location: [facility];  Service: Gastroenterology;;  . RIGHT HEART CATH N/A 02/15/2020   Procedure: RIGHT HEART CATH;  Surgeon: Jolaine Artist, MD;  Location: [facility];  Service: Cardiovascular;  Laterality: N/A;  . TONSILLECTOMY AND ADENOIDECTOMY     age 37  . TUBAL LIGATION     years ago  . UPPER GASTROINTESTINAL ENDOSCOPY    . UPPER GI ENDOSCOPY  11/28/2009   mild erosive esophagitis, mild erosive changes in stomach, chronic constipation     OB History   No obstetric history on file.     Family History  Problem Relation Age of Onset  . Colon polyps Daughter   . Colon cancer Neg Hx   . Esophageal cancer Neg Hx   . Rectal cancer Neg Hx   . Stomach cancer Neg Hx     Social History   Tobacco Use  . Smoking status: Former Smoker    Quit date: 03/14/2009    Years since quitting: 10.9  . Smokeless tobacco: Never Used  . Tobacco comment: quit smoking 2010  Substance Use Topics  . Alcohol use: No  . Drug use: No    Home Medications Prior to Admission medications   Medication Sig Start Date End Date Taking? Authorizing Provider  ACCU-CHEK AVIVA PLUS test strip USE  TO TEST BLOOD SUGAR ONE TIME DAILY Patient taking differently: 1 each by Other route as needed (To test blood sugar).  02/08/20  Yes Henson, Vickie L,  NP-C  Accu-Chek Softclix Lancets lancets USE  AS  INSTRUCTED Patient taking differently: 1 each by Other route as needed.  06/26/19  Yes Henson, Vickie L, NP-C  albuterol (VENTOLIN HFA) 108 (90 Base) MCG/ACT inhaler Inhale 2 puffs into the lungs every 6 (six) hours as needed for wheezing or shortness of breath. 02/21/20  Yes Shelly Coss, MD  Alcohol Swabs (B-D SINGLE USE SWABS REGULAR) PADS USE  TO TEST BLOOD SUGAR ONE TIME DAILY Patient taking differently: 1 each by Other route as needed (Clean skin before blood sugar reading).  07/27/19  Yes Henson, Vickie L, NP-C  amLODipine (NORVASC) 10 MG tablet TAKE 1 TABLET EVERY DAY Patient taking differently: Take 10 mg by mouth daily.  01/16/20  Yes Henson, Vickie L, NP-C  aspirin 81 MG tablet Take 81 mg by mouth at bedtime.    Yes [provider]  atorvastatin (LIPITOR) 40 MG tablet TAKE 1 TABLET EVERY DAY Patient  taking differently: Take 40 mg by mouth daily.  01/16/20  Yes Henson, Vickie L, NP-C  budesonide (PULMICORT) 0.5 MG/2ML nebulizer solution Take 2 mLs (0.5 mg total) by nebulization 2 (two) times daily. 02/21/20  Yes Shelly Coss, MD  carvedilol (COREG) 25 MG tablet TAKE 1 TABLET TWICE DAILY WITH A MEAL Patient taking differently: Take 25 mg by mouth 2 (two) times daily with a meal.  01/24/19  Yes Henson, Vickie L, NP-C  cholecalciferol (VITAMIN D3) 25 MCG (1000 UNIT) tablet Take 1,000 Units by mouth daily.   Yes [provider]  ferrous sulfate 325 (65 FE) MG tablet Take 325 mg by mouth daily.    Yes [provider]  Ipratropium-Albuterol (COMBIVENT RESPIMAT) 20-100 MCG/ACT AERS respimat Inhale 1 puff into the lungs in the morning, at noon, and at bedtime.   Yes [provider]  ipratropium-albuterol (DUONEB) 0.5-2.5 (3) MG/3ML SOLN Take 3 mLs by nebulization every 6 (six) hours as needed. Patient taking differently: Take 3 mLs by nebulization every 6 (six) hours as needed (wheezing, SOB).  02/21/20  Yes Shelly Coss, MD  MYRBETRIQ 50 MG TB24 tablet TAKE 1 TABLET EVERY DAY Patient taking differently: Take 50 mg by mouth daily as needed (overactive bladder).  10/25/19  Yes Henson, Vickie L, NP-C  OXYGEN Inhale 2 L into the lungs continuous.    Yes [provider]  polyethylene glycol (MIRALAX / GLYCOLAX) 17 g packet Take 17 g by mouth daily as needed for moderate constipation. 02/21/20  Yes Adhikari, Tamsen Meek, MD  potassium chloride SA (KLOR-CON) 20 MEQ tablet Take 1 tablet (20 mEq total) by mouth daily. 02/22/20  Yes Shelly Coss, MD  sildenafil (REVATIO) 20 MG tablet Take 1 tablet (20 mg total) by mouth 3 (three) times daily. 02/21/20  Yes Shelly Coss, MD  torsemide (DEMADEX) 20 MG tablet Take 2 tablets (40 mg total) by mouth daily. Patient taking differently: Take 60 mg by mouth daily.  02/21/20  Yes Shelly Coss, MD    Allergies    Benazepril hcl, Lotrel [amlodipine besy-benazepril hcl], and Talwin [pentazocine]  Review of Systems   Review of Systems  Constitutional: Positive for fatigue. Negative for chills and fever.  HENT: Negative for ear pain and sore throat.   Eyes: Negative for pain and visual disturbance.  Respiratory: Positive for shortness of breath. Negative for cough.   Cardiovascular: Positive for leg swelling. Negative for chest pain and palpitations.  Gastrointestinal: Negative for abdominal pain and vomiting.  Genitourinary: Negative for dysuria and hematuria.  Musculoskeletal: Negative for arthralgias and back pain.  Skin: Negative for color change and rash.  Neurological: Negative for seizures and syncope.  All other systems reviewed and are negative.   Physical Exam Updated Vital Signs BP (!) 102/47   Pulse (!) 59   Temp 98.5 F (36.9 C) (Oral)   Resp 18   SpO2 96%   Physical Exam Vitals and nursing note reviewed.  Constitutional:      General: She is not in acute distress.    Appearance: She is well-developed and normal weight. She is ill-appearing. She  is not toxic-appearing.  HENT:     Head: Normocephalic and atraumatic.  Eyes:     Extraocular Movements: Extraocular movements intact.     Conjunctiva/sclera: Conjunctivae normal.     Pupils: Pupils are equal, round, and reactive to light.  Cardiovascular:     Rate and Rhythm: Normal rate and regular rhythm.     Pulses: Normal pulses.  Heart sounds: No murmur heard.   Pulmonary:     Effort: Pulmonary effort is normal. Tachypnea present. No respiratory distress.     Breath sounds: Rhonchi present.  Abdominal:     General: There is no distension.     Palpations: Abdomen is soft.     Tenderness: There is no abdominal tenderness. There is no guarding.  Musculoskeletal:     Cervical back: Normal range of motion and neck supple. No rigidity.     Right lower leg: Edema present.     Left lower leg: Edema present.  Skin:    General: Skin is warm and dry.     Capillary Refill: Capillary refill takes less than 2 seconds.  Neurological:     General: No focal deficit present.     Mental Status: She is alert and oriented to person, place, and time. Mental status is at baseline.     GCS: GCS eye subscore is 4. GCS verbal subscore is 5. GCS motor subscore is 6.  Psychiatric:        Mood and Affect: Mood normal.        Behavior: Behavior normal.     ED Results / Procedures / Treatments   Labs (all labs ordered are listed, but only abnormal results are displayed) Labs Reviewed  BASIC METABOLIC PANEL - Abnormal; Notable for the following components:      Result Value   Chloride 97 (*)    Glucose, Bld 200 (*)    BUN 58 (*)    Creatinine, Ser 3.42 (*)    GFR calc non Af Amer 12 (*)    GFR calc Af Amer 14 (*)    All other components within normal limits  CBC - Abnormal; Notable for the following components:   RBC 3.50 (*)    Hemoglobin 9.4 (*)    HCT 31.8 (*)    MCHC 29.6 (*)    RDW 16.7 (*)    All other components within normal limits  BRAIN NATRIURETIC PEPTIDE - Abnormal;  Notable for the following components:   B Natriuretic Peptide 466.5 (*)    All other components within normal limits  MAGNESIUM - Abnormal; Notable for the following components:   Magnesium 2.5 (*)    All other components within normal limits  PHOSPHORUS - Abnormal; Notable for the following components:   Phosphorus 5.8 (*)    All other components within normal limits  SARS CORONAVIRUS 2 BY RT PCR (HOSPITAL ORDER, [facility])  HEPATIC FUNCTION PANEL  CREATININE, URINE, RANDOM  SODIUM, URINE, RANDOM  UREA NITROGEN, URINE    EKG EKG Interpretation  Date/Time:  Sunday March 03 2020 16:33:08 EDT Ventricular Rate:  61 PR Interval:    QRS Duration: 76 QT Interval:  438 QTC Calculation: 440 R Axis:   42 Text Interpretation: Atrial-paced rhythm with prolonged AV conduction Low voltage QRS Cannot rule out Anterior infarct , age undetermined Abnormal ECG When compared with ECG of 02/11/2020, Premature ventricular complexes are no longer present Confirmed by Delora Fuel ([id number]) on 03/03/2020 11:05:27 PM   Radiology DG Chest 2 View  Result Date: 03/03/2020 CLINICAL DATA:  Shortness of breath and lower extremity edema. EXAM: CHEST - 2 VIEW COMPARISON:  02/19/2020 FINDINGS: Left-sided pacemaker unchanged. Lungs are somewhat hypoinflated without focal airspace consolidation or effusion. Subtle hazy prominence of the central perihilar markings likely mild vascular congestion. Stable moderate cardiomegaly. Remainder of the exam is unchanged. IMPRESSION: Cardiomegaly with suggestion of minimal vascular  congestion. Electronically Signed   By: Marin Olp M.D.   On: 03/03/2020 17:31    Procedures Procedures (including critical care time)  Medications Ordered in ED Medications  furosemide (LASIX) injection 60 mg (has no administration in time range)  ipratropium-albuterol (DUONEB) 0.5-2.5 (3) MG/3ML nebulizer solution 3 mL (has no administration in time range)   budesonide (PULMICORT) nebulizer solution 0.5 mg (has no administration in time range)  sodium chloride flush (NS) 0.9 % injection 3 mL (has no administration in time range)  sodium chloride flush (NS) 0.9 % injection 3 mL (has no administration in time range)  0.9 %  sodium chloride infusion (has no administration in time range)  acetaminophen (TYLENOL) tablet 650 mg (has no administration in time range)  ondansetron (ZOFRAN) injection 4 mg (has no administration in time range)  heparin injection 5,000 Units (has no administration in time range)  aspirin EC tablet 81 mg (has no administration in time range)  atorvastatin (LIPITOR) tablet 40 mg (has no administration in time range)  insulin aspart (novoLOG) injection 0-6 Units (has no administration in time range)    ED Course   [name] is a 79 y.o. female with PMHx listed that presents to the Emergency Department complaint of Shortness of Breath  ED Course: Initial exam completed.   Ill-appearing but hemodynamically stable.  Nontoxic.  Afebrile.  Physical exam significant for age-appropriate 79 year old female with bilateral lower extremity edema, right worse than left, tachypnea, decreased breath sounds at the bases with some faint rhonchi, abdomen soft, nondistended, nontender.  Initial differential includes CHF exacerbation, acute kidney injury, electrolyte abnormalities, pneumonia, viral illness, liver failure, ACS, and COPD/asthma exacerbation.   Triage work-up reviewed.  BMP with elevated creatinine 3.42; baseline approximately 2.  CBC without evidence of leukocytosis or leukopenia and stable hemoglobin although anemic at 9.4; most recently 10.8.  CXR with cardiomegaly and some evidence of vascular congestion.  Additional labs added on including BNP.  BNP elevated 466; recent admission for CHF exacerbation BNP approximately 600.  Magnesium 2.5.  Hepatic function unremarkable.  Phosphorus 5.8.  COVID-19 negative.  Overall,  likely presentation today is multifactorial and consistent with chronic lung disease and chronic CHF exacerbations.  Discussed with the hospitalist who agreed to admit the patient to their service for further management.  We discussed the patient's AKI and will treat with Lasix as likely cardiorenal and trend creatinine.  60 mg IV Lasix ordered.  Diagnostics Vital Signs: reviewed Labs: reviewed and significant findings discussed above Imaging: personally reviewed images interpreted by radiology EKG: reviewed Records: nursing notes along with previous records reviewed and pertinent data discussed   Consults:  Hospitalist   Reevaluation/Disposition:  Upon reevaluation, patients symptoms stable.    All questions answered. Patient and/or family was understanding and in agreement with today's assessment and plan.   Sherolyn Buba, MD Emergency Medicine, PGY-3   Note: Dragon medical dictation software was used in the creation of this note.   Final Clinical Impression(s) / ED Diagnoses Final diagnoses:  Acute on chronic congestive heart failure, unspecified heart failure type ([place])  Dyspnea on exertion    Rx / DC Orders ED Discharge Orders    None       Frann Rider, MD 03/04/20 Lurena Nida    Carmin Muskrat, MD 03/06/20 [phone]

## 2020-03-04 NOTE — ED Notes (Signed)
RN paged MD Shalhoub, and inquires about the lasix administration. MD states to hold for an hour to see if the pt BP rises and if it doesn't to message him back for reevaluation.

## 2020-03-04 NOTE — H&P (Addendum)
History and Physical    [name] VVO:[id number] DOB: 1941-01-12 DOA: 03/03/2020  Referring MD/NP/PA: Jose Persia, MD (resident) PCP: Girtha Rm, NP-C  Patient coming from: [facility] rehab via EMS  Chief Complaint: Shortness of breath  I have personally briefly reviewed patient's old medical records in [place]   HPI: [name] is a 79 y.o. female with medical history significant of HTN, COPD, systolic CHF followed by Dr. [name], PAH, nonobstructive CAD, DM type II controlled, CKD stage IV followed by Dr. [name], and OSA on CPAP presented with progressively worsening shortness of breath over the last 4 days.  Just recently hospitalized 7/25-8/5 for acute on chronic systolic congestive heart failure with EF found to be 45 to 50% with global hypokinesis and elevated pulmonary artery pressures.  Prior to that hospitalization patient only used oxygen at night with CPAP.  She reported progressively worsening swelling of her lower extremities, weight gain, and had been wearing 2 L nasal cannula oxygen continuously.  Denies any recent sick contacts, chest pain, nausea, vomiting, or diarrhea.  Due to her symptoms they have followed up with her providers and have been recommended to increase to 60 mg of torsemide daily which she had done for the last 3 days without any improvement in symptoms.  Urine outputs were noted to be minimal.   ED Course: Upon admission into the emergency department patient was seen to be afebrile with systolic blood pressures initially as low as 86/50, and O2 saturations maintained on 4-6 L of nasal.  Labs were significant for hemoglobin 9.4, BUN 58, creatinine 3.42, glucose 200, and BNP 466.5.  Chest x-ray significant for cardiomegaly with mild vascular congestion.  Patient was ordered 60 mg of Lasix IV.  TRH called to admit.  Review of Systems  Constitutional: Positive for malaise/fatigue. Negative for fever.  HENT: Negative for congestion and  nosebleeds.   Eyes: Negative for photophobia and pain.  Respiratory: Positive for shortness of breath. Negative for cough.   Cardiovascular: Positive for orthopnea and leg swelling. Negative for chest pain.  Gastrointestinal: Negative for abdominal pain, diarrhea, nausea and vomiting.  Genitourinary: Negative for dysuria and hematuria.  Musculoskeletal: Negative for joint pain and myalgias.  Skin: Negative for rash.  Neurological: Negative for focal weakness and loss of consciousness.  Endo/Heme/Allergies: Negative for polydipsia. Does not bruise/bleed easily.  Psychiatric/Behavioral: Negative for substance abuse. The patient is not nervous/anxious.     Past Medical History:  Diagnosis Date  . Anemia   . CAD (coronary artery disease)   . Cardiac defibrillator in situ   . Cataract    bilateral 2017  . CHF (congestive heart failure) ([place])   . Chronic systolic heart failure ([place])   . CKD (chronic kidney disease) stage 4, GFR 15-29 ml/min (HCC)   . COPD (chronic obstructive pulmonary disease) ([place])   . HTN (hypertension)   . 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 Rollene Rotunda in [place]. Per notes she was due for colonoscopy in 12/2016  . OSA on CPAP 09/08/2017  . Other cardiomyopathies ([place])   . Oxygen deficiency    2 liters at night  . Pulmonary HTN ([place])   . Sleep apnea    uses c-pap  . Stroke ([place])   . Type 2 diabetes, controlled, with peripheral neuropathy ([employer])   . Urge incontinence of urine     Past Surgical History:  Procedure Laterality Date  . BIOPSY  03/29/2018   Procedure: BIOPSY;  Surgeon:  Armbruster, Carlota Raspberry, MD;  Location: [facility];  Service: Gastroenterology;;  . COLONOSCOPY    . COLONOSCOPY WITH PROPOFOL N/A 03/29/2018   Procedure: COLONOSCOPY WITH PROPOFOL;  Surgeon: Yetta Flock, MD;  Location: [facility];  Service: Gastroenterology;  Laterality: N/A;  . ESOPHAGOGASTRODUODENOSCOPY (EGD) WITH PROPOFOL N/A 03/29/2018   Procedure:  ESOPHAGOGASTRODUODENOSCOPY (EGD) WITH PROPOFOL;  Surgeon: Yetta Flock, MD;  Location: [facility];  Service: Gastroenterology;  Laterality: N/A;  . POLYPECTOMY  03/29/2018   Procedure: POLYPECTOMY;  Surgeon: Yetta Flock, MD;  Location: [facility];  Service: Gastroenterology;;  . RIGHT HEART CATH N/A 02/15/2020   Procedure: RIGHT HEART CATH;  Surgeon: Jolaine Artist, MD;  Location: [facility];  Service: Cardiovascular;  Laterality: N/A;  . TONSILLECTOMY AND ADENOIDECTOMY     age 13  . TUBAL LIGATION     years ago  . UPPER GASTROINTESTINAL ENDOSCOPY    . UPPER GI ENDOSCOPY  11/28/2009   mild erosive esophagitis, mild erosive changes in stomach, chronic constipation     reports that she quit smoking about 10 years ago. She has never used smokeless tobacco. She reports that she does not drink alcohol and does not use drugs.  Allergies  Allergen Reactions  . Benazepril Hcl Other (See Comments)    Unknown  . Lotrel [Amlodipine Besy-Benazepril Hcl] Other (See Comments)    Hypotension   . Talwin [Pentazocine] Other (See Comments)    Hallucinations      Family History  Problem Relation Age of Onset  . Colon polyps Daughter   . Colon cancer Neg Hx   . Esophageal cancer Neg Hx   . Rectal cancer Neg Hx   . Stomach cancer Neg Hx     Prior to Admission medications   Medication Sig Start Date End Date Taking? Authorizing Provider  ACCU-CHEK AVIVA PLUS test strip USE  TO TEST BLOOD SUGAR ONE TIME DAILY Patient taking differently: 1 each by Other route as needed (To test blood sugar).  02/08/20  Yes Henson, Vickie L, NP-C  Accu-Chek Softclix Lancets lancets USE  AS  INSTRUCTED Patient taking differently: 1 each by Other route as needed.  06/26/19  Yes Henson, Vickie L, NP-C  albuterol (VENTOLIN HFA) 108 (90 Base) MCG/ACT inhaler Inhale 2 puffs into the lungs every 6 (six) hours as needed for wheezing or shortness of breath. 02/21/20  Yes Shelly Coss, MD   Alcohol Swabs (B-D SINGLE USE SWABS REGULAR) PADS USE  TO TEST BLOOD SUGAR ONE TIME DAILY Patient taking differently: 1 each by Other route as needed (Clean skin before blood sugar reading).  07/27/19  Yes Henson, Vickie L, NP-C  amLODipine (NORVASC) 10 MG tablet TAKE 1 TABLET EVERY DAY Patient taking differently: Take 10 mg by mouth daily.  01/16/20  Yes Henson, Vickie L, NP-C  aspirin 81 MG tablet Take 81 mg by mouth at bedtime.    Yes [provider]  atorvastatin (LIPITOR) 40 MG tablet TAKE 1 TABLET EVERY DAY Patient taking differently: Take 40 mg by mouth daily.  01/16/20  Yes Henson, Vickie L, NP-C  budesonide (PULMICORT) 0.5 MG/2ML nebulizer solution Take 2 mLs (0.5 mg total) by nebulization 2 (two) times daily. 02/21/20  Yes Shelly Coss, MD  carvedilol (COREG) 25 MG tablet TAKE 1 TABLET TWICE DAILY WITH A MEAL Patient taking differently: Take 25 mg by mouth 2 (two) times daily with a meal.  01/24/19  Yes Henson, Vickie L, NP-C  cholecalciferol (VITAMIN D3) 25 MCG (1000  UNIT) tablet Take 1,000 Units by mouth daily.   Yes [provider]  ferrous sulfate 325 (65 FE) MG tablet Take 325 mg by mouth daily.    Yes [provider]  Ipratropium-Albuterol (COMBIVENT RESPIMAT) 20-100 MCG/ACT AERS respimat Inhale 1 puff into the lungs in the morning, at noon, and at bedtime.   Yes [provider]  ipratropium-albuterol (DUONEB) 0.5-2.5 (3) MG/3ML SOLN Take 3 mLs by nebulization every 6 (six) hours as needed. Patient taking differently: Take 3 mLs by nebulization every 6 (six) hours as needed (wheezing, SOB).  02/21/20  Yes Shelly Coss, MD  MYRBETRIQ 50 MG TB24 tablet TAKE 1 TABLET EVERY DAY Patient taking differently: Take 50 mg by mouth daily as needed (overactive bladder).  10/25/19  Yes Henson, Vickie L, NP-C  OXYGEN Inhale 2 L into the lungs continuous.    Yes [provider]  polyethylene glycol (MIRALAX / GLYCOLAX) 17 g packet Take 17 g by mouth daily  as needed for moderate constipation. 02/21/20  Yes Adhikari, Tamsen Meek, MD  potassium chloride SA (KLOR-CON) 20 MEQ tablet Take 1 tablet (20 mEq total) by mouth daily. 02/22/20  Yes Shelly Coss, MD  sildenafil (REVATIO) 20 MG tablet Take 1 tablet (20 mg total) by mouth 3 (three) times daily. 02/21/20  Yes Shelly Coss, MD  torsemide (DEMADEX) 20 MG tablet Take 2 tablets (40 mg total) by mouth daily. Patient taking differently: Take 60 mg by mouth daily.  02/21/20  Yes Shelly Coss, MD    Physical Exam:  Constitutional: Elderly female who appears to be in some respiratory distress Vitals:   03/04/20 0834 03/04/20 0839 03/04/20 0844 03/04/20 0849  BP: (!) 102/47     Pulse: (!) 59 60 (!) 59 (!) 59  Resp:      Temp:      TempSrc:      SpO2: 91% (!) 89% 96% 96%   Eyes: PERRL, lids and conjunctivae normal ENMT: Mucous membranes are moist. Posterior pharynx clear of any exudate or lesions. .  Neck: normal, supple, no masses, no thyromegaly.  JVD appreciated. Respiratory: Positive crackles heard in both lung fields. Cardiovascular: Bradycardic, no murmurs / rubs / gallops.  3+ pitting edema extremity edema. 2+ pedal pulses. No carotid bruits.  Abdomen: no tenderness, no masses palpated. No hepatosplenomegaly. Bowel sounds positive.  Musculoskeletal: no clubbing / cyanosis. No joint deformity upper and lower extremities. Good ROM, no contractures. Normal muscle tone.  Skin: no rashes, lesions, ulcers. No induration Neurologic: CN 2-12 grossly intact. Sensation intact, DTR normal. Strength 5/5 in all 4.  Psychiatric: Normal judgment and insight. Alert and oriented x 3. Normal mood.     Labs on Admission: I have personally reviewed following labs and imaging studies  CBC: Recent Labs  Lab 03/03/20 1718  WBC 4.0  HGB 9.4*  HCT 31.8*  MCV 90.9  PLT 947   Basic Metabolic Panel: Recent Labs  Lab 03/03/20 1718 03/04/20 1010  NA 135  --   K 4.8  --   CL 97*  --   CO2 26  --    GLUCOSE 200*  --   BUN 58*  --   CREATININE 3.42*  --   CALCIUM 9.1  --   MG  --  2.5*  PHOS  --  5.8*   GFR: Estimated Creatinine Clearance: 12.3 mL/min (A) (by C-G formula based on SCr of 3.42 mg/dL (H)). Liver Function Tests: Recent Labs  Lab 03/04/20 1010  AST 16  ALT  15  ALKPHOS 109  BILITOT 1.0  PROT 7.1  ALBUMIN 3.7   No results for input(s): LIPASE, AMYLASE in the last 168 hours. No results for input(s): AMMONIA in the last 168 hours. Coagulation Profile: No results for input(s): INR, PROTIME in the last 168 hours. Cardiac Enzymes: No results for input(s): CKTOTAL, CKMB, CKMBINDEX, TROPONINI in the last 168 hours. BNP (last 3 results) No results for input(s): PROBNP in the last 8760 hours. HbA1C: No results for input(s): HGBA1C in the last 72 hours. CBG: No results for input(s): GLUCAP in the last 168 hours. Lipid Profile: No results for input(s): CHOL, HDL, LDLCALC, TRIG, CHOLHDL, LDLDIRECT in the last 72 hours. Thyroid Function Tests: No results for input(s): TSH, T4TOTAL, FREET4, T3FREE, THYROIDAB in the last 72 hours. Anemia Panel: No results for input(s): VITAMINB12, FOLATE, FERRITIN, TIBC, IRON, RETICCTPCT in the last 72 hours. Urine analysis:    Component Value Date/Time   COLORURINE YELLOW 02/20/2020 [place] 02/20/2020 0735   LABSPEC 1.008 02/20/2020 0735   LABSPEC 1.015 08/25/2018 1448   PHURINE 5.0 02/20/2020 0735   GLUCOSEU NEGATIVE 02/20/2020 0735   HGBUR NEGATIVE 02/20/2020 0735   BILIRUBINUR NEGATIVE 02/20/2020 0735   BILIRUBINUR negative 08/25/2018 1448   KETONESUR NEGATIVE 02/20/2020 0735   PROTEINUR NEGATIVE 02/20/2020 0735   UROBILINOGEN 4.0 (H) 07/05/2007 1115   NITRITE NEGATIVE 02/20/2020 0735   LEUKOCYTESUR NEGATIVE 02/20/2020 0735   Sepsis Labs: Recent Results (from the past 240 hour(s))  SARS Coronavirus 2 by RT PCR (hospital order, performed in [facility] hospital lab) Nasopharyngeal Nasopharyngeal Swab      Status: None   Collection Time: 03/04/20  8:38 AM   Specimen: Nasopharyngeal Swab  Result Value Ref Range Status   SARS Coronavirus 2 NEGATIVE NEGATIVE Final    Comment: (NOTE) SARS-CoV-2 target nucleic acids are NOT DETECTED.  The SARS-CoV-2 RNA is generally detectable in upper and lower respiratory specimens during the acute phase of infection. The lowest concentration of SARS-CoV-2 viral copies this assay can detect is 250 copies / mL. A negative result does not preclude SARS-CoV-2 infection and should not be used as the sole basis for treatment or other patient management decisions.  A negative result may occur with improper specimen collection / handling, submission of specimen other than nasopharyngeal swab, presence of viral mutation(s) within the areas targeted by this assay, and inadequate number of viral copies (<250 copies / mL). A negative result must be combined with clinical observations, patient history, and epidemiological information.  Fact Sheet for Patients:   StrictlyIdeas.no  Fact Sheet for Healthcare Providers: BankingDealers.co.za  This test is not yet approved or  cleared by the [place] FDA and has been authorized for detection and/or diagnosis of SARS-CoV-2 by FDA under an Emergency Use Authorization (EUA).  This EUA will remain in effect (meaning this test can be used) for the duration of the COVID-19 declaration under Section 564(b)(1) of the Act, 21 U.S.C. section 360bbb-3(b)(1), unless the authorization is terminated or revoked sooner.  Performed at [facility], [place] [address] [place], [place] [address]      Radiological Exams on Admission: DG Chest 2 View  Result Date: 03/03/2020 CLINICAL DATA:  Shortness of breath and lower extremity edema. EXAM: CHEST - 2 VIEW COMPARISON:  02/19/2020 FINDINGS: Left-sided pacemaker unchanged. Lungs are somewhat hypoinflated without focal airspace  consolidation or effusion. Subtle hazy prominence of the central perihilar markings likely mild vascular congestion. Stable moderate cardiomegaly. Remainder of the  exam is unchanged. IMPRESSION: Cardiomegaly with suggestion of minimal vascular congestion. Electronically Signed   By: Marin Olp M.D.   On: 03/03/2020 17:31    EKG: Independently reviewed.  Atrial paced rhythm 61 bpm  Assessment/Plan Acute on chronic respiratory failure with hypoxia secondary to systolic congestive heart failure: Patient presents with progressively worsening shortness of breath and lower extremity swelling.  Last EF noted to be 45-50% with global hypokinesis and signs of cor pulmonale on 7/26.  BNP was elevated at 466.5.  Chest x-ray showing cardiomegaly with vascular congestion.  Ordered to be given 60 mg Lasix IV. -Admit to a telemetry bed  -Heart failure orders set  initiated   -Continuous pulse oximetry with nasal cannula oxygen as needed to keep O2 saturations >92% -Strict I&Os and daily weights -Elevate lower extremities -Cardiology consulted, will follow-up for further recommendation  Transient hypotension: Home medications include amlodipine 10 mg daily, Coreg 25 mg twice daily, and torsemide 60 mg daily. -Currently holding home blood pressure medications while attempting to diurese   Acute renal failure superimposed on chronic kidney disease stage IV: Patient presents with creatinine elevated up to 3.42 with BUN 58.  Baseline creatinine previously noted to be around 2.  Question possibility of a cardiorenal syndrome.  Patient is followed by Dr. [name] of [facility] in the outpatient setting. -Check urine creatinine, sodium, and urea -Dr. Candiss Norse of nephrology formally consulted,  will follow-up for further recommendation  Restrictive lung disease/chronic respiratory insufficiency: Prior PFTs had shown significant restrictive lung disease with marked reduced DLCO.   -Continue budesonide  nebs and duo nebs as needed for shortness of breath/wheezing  Complete heart block s/p ICD   Pulmonary artery hypertension: Thought secondary to chronic restrictive lung disease and obstructive sleep apnea.  Right heart catheter from 7/29 showed severe pulmonary artery hypertension. -Held sildenafil   Diabetes mellitus type 2: Patient appears relatively well controlled as last hemoglobin A1c 6.5 on 7/29.  On admission glucose elevated up to 200. -Hypoglycemic protocol -CBGs q. AC with a very sensitive sliding scale insulin  Anemia of chronic disease: Hemoglobin 9.4 which appears near patient's baseline.  Hyperlipidemia -Continue atorvastatin  Debility: Patient has been in rehab since her last hospitalization.  Obstructive sleep apnea on CPAP -Continue CPAP nightly  DVT prophylaxis: Heparin Code Status: Full Family Communication: Daughter updated at bedside Disposition Plan: Likely discharge back to rehab Consults called: Cardiology Admission status: Inpatient  Norval Morton MD [facility] Pager [phone]   If 7PM-7AM, please contact night-coverage [email or web address] Password [facility]  03/04/2020, 11:37 AM

## 2020-03-04 NOTE — ED Notes (Signed)
pts oxygen saturation noted to drop to 84% when supplemental oxygen disconnected

## 2020-03-04 NOTE — ED Notes (Signed)
RN has messaged MD Shalhoub and informed him of pt current BP,  And urinary output, he does state to hold lasix for tonight and resume tomorrow. RN will follow orders and continue to monitor pt

## 2020-03-04 NOTE — Progress Notes (Signed)
Nephrology [facility]  Requesting provider: Norval Morton, MD Reason for consult: aki on ckd   Assessment/Recommendations: [name] is a/an 79 y.o. 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 Now with Cr 3.4 -AKI likely secondary to cardiorenal syndrome and hypotension leading to decreased EABV/renal perfusion -recommend diuresis for now. Start with lasix 82m BID (IV), can increase dose up to 1254mtwice daily depending on her response -Check Renal U/S -UA with sediment exam -Currently no indication for HD -Continue to monitor daily Cr, Dose meds for GFR<15 -Monitor Daily I/Os, Daily weight  -Maintain MAP>65 for optimal renal perfusion.   Acute on chronic respiratory failure secondary to systolic CHF -Lasix as above.  Monitor I's and O's and daily weights.  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  CPAP nightly  Anemia of chronic disease -Transfuse for Hgb<7 g/dL.  Hemoglobin currently stable, monitor for now  Diabetes Mellitus type 2: mgmt per primary service  Recommendations conveyed to primary service.   [facility] 03/04/2020 2:19 PM   _____________________________________________________________________________________   History of Present Illness: [name] a/an [age].o. female with a past medical history of CKD 3 (followed by Dr. BhCarolin Sicks hypertension, COPD, systolic CHF, PAH, nonobstructive CAD, complete heart block status post ICD, type II DM, OSA on CPAP [facility].  She was just recently hospitalized from 7/25-8/5 (discharged to rehab)  for acute on chronic systolic heart failure and found to have an EF around 45 to 50% along with global hypokinesis and elevated pulmonary artery pressures (underwent right heart cath on 02/15/2020).  At that time she was diuresed adequately and did have some rise in creatinine due to this.  Urged with torsemide 60 mg daily and she had done that for the last 3 days without any improvement in her symptoms. Found to also be hypotensive upon her arrival here (86/50), per daughter and patient was not getting sildenafil on time at the facility and felt like it was being given close together. They also report minimal urine output in the last 3 days (total of anywhere between 300-400cc in the last 3 days). Does also report orthopnea. Denies n/v/d, chest pain.   Medications:  Current Facility-Administered Medications  Medication Dose Route Frequency Provider Last Rate Last Admin  . 0.9 %  sodium chloride infusion  250 mL Intravenous PRN SmFuller Plan, MD      . acetaminophen (TYLENOL) tablet 650 mg  650 mg Oral Q4H PRN SmFuller Plan, MD      . aspirin EC tablet 81 mg  81 mg Oral QHS Smith, Rondell A, MD      . atorvastatin (LIPITOR) tablet 40 mg  40 mg Oral Daily Smith, Rondell A, MD      . budesonide (PULMICORT) nebulizer solution 0.5 mg  0.5 mg Nebulization BID Smith, Rondell A, MD      . furosemide (LASIX) injection 60 mg  60 mg Intravenous Once Smith, Rondell A, MD      . heparin injection 5,000 Units  5,000 Units Subcutaneous Q8H Smith, Rondell A, MD      . insulin aspart (novoLOG) injection 0-6 Units  0-6 Units Subcutaneous TID WC Smith, Rondell A, MD      . ipratropium-albuterol (DUONEB) 0.5-2.5 (3) MG/3ML nebulizer solution 3 mL  3 mL Nebulization Q6H PRN Smith, Rondell A, MD      . ondansetron (ZOFRAN) injection 4 mg  4 mg Intravenous Q6H PRN Smith, Rondell A, MD      . sodium chloride flush (NS) 0.9 % injection 3 mL  3 mL Intravenous Q12H Smith, Rondell A, MD      . sodium chloride flush  (NS) 0.9 % injection 3 mL  3 mL Intravenous PRN Norval Morton, MD       Current Outpatient Medications  Medication Sig Dispense Refill  . ACCU-CHEK AVIVA PLUS test strip USE  TO TEST BLOOD SUGAR ONE TIME DAILY (Patient taking differently: 1 each by Other route as needed (To test blood sugar). ) 100 strip 1  . Accu-Chek Softclix Lancets lancets USE  AS  INSTRUCTED (Patient taking differently: 1 each by Other route as needed. ) 100 each 2  . albuterol (VENTOLIN HFA) 108 (90 Base) MCG/ACT inhaler Inhale 2 puffs into the lungs every 6 (six) hours as needed for wheezing or shortness of breath.    . Alcohol Swabs (B-D SINGLE USE SWABS REGULAR) PADS USE  TO TEST BLOOD SUGAR ONE TIME DAILY (Patient taking differently: 1 each by Other route as needed (Clean skin before blood sugar reading). ) 100 each 1  . amLODipine (NORVASC) 10 MG tablet TAKE 1 TABLET EVERY DAY (Patient taking differently: Take 10 mg by mouth daily. ) 90 tablet 0  . aspirin 81 MG tablet Take 81 mg by mouth at bedtime.     [name] atorvastatin (LIPITOR) 40 MG tablet TAKE 1 TABLET EVERY DAY (Patient taking differently: Take 40 mg by mouth daily. ) 90 tablet 0  . budesonide (PULMICORT) 0.5 MG/2ML nebulizer solution Take 2 mLs (0.5 mg total) by nebulization 2 (two) times daily.  12  . carvedilol (COREG) 25 MG tablet TAKE 1 TABLET TWICE DAILY WITH A MEAL (Patient taking differently: Take 25 mg by mouth 2 (two) times daily with a meal. ) 90 tablet 0  . cholecalciferol (VITAMIN D3) 25 MCG (1000 UNIT) tablet Take 1,000 Units by mouth daily.    . ferrous sulfate 325 (65 FE) MG tablet Take 325 mg by mouth daily.     . Ipratropium-Albuterol (COMBIVENT RESPIMAT) 20-100 MCG/ACT AERS respimat Inhale 1 puff into the lungs in the morning, at noon, and at bedtime.    [name] ipratropium-albuterol (DUONEB) 0.5-2.5 (3) MG/3ML SOLN Take 3 mLs by nebulization every 6 (six) hours as needed. (Patient taking differently: Take 3 mLs by nebulization every 6 (six) hours as  needed (wheezing, SOB). ) 360 mL   . MYRBETRIQ 50 MG TB24 tablet TAKE 1 TABLET EVERY DAY (Patient taking differently: Take 50 mg by mouth daily as needed (overactive bladder). ) 90 tablet 0  . OXYGEN Inhale 2 L into the lungs continuous.     . polyethylene glycol (MIRALAX / GLYCOLAX) 17 g packet Take 17 g by mouth daily as needed for moderate constipation. 14 each 0  . potassium chloride SA (KLOR-CON) 20 MEQ tablet Take 1 tablet (20 mEq total) by mouth daily.    . sildenafil (REVATIO) 20 MG tablet Take 1 tablet (20 mg total) by mouth 3 (three) times daily.  0  . torsemide (DEMADEX) 20 MG tablet Take 2 tablets (40 mg total) by mouth daily. (Patient taking differently: Take 60 mg by mouth  daily. )       ALLERGIES Benazepril hcl, Lotrel [amlodipine besy-benazepril hcl], and Talwin [pentazocine]  MEDICAL HISTORY Past Medical History:  Diagnosis Date  . Anemia   . CAD (coronary artery disease)   . Cardiac defibrillator in situ   . Cataract    bilateral 2017  . CHF (congestive heart failure) ([place])   . Chronic systolic heart failure ([place])   . CKD (chronic kidney disease) stage 4, GFR 15-29 ml/min ([facility])   . COPD (chronic obstructive pulmonary disease) ([place])   . HTN (hypertension)   . 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 Rollene Rotunda in [place]. Per notes she was due for colonoscopy in 12/2016  . OSA on CPAP 09/08/2017  . Other cardiomyopathies ([place])   . Oxygen deficiency    2 liters at night  . Pulmonary HTN ([place])   . Sleep apnea    uses c-pap  . Stroke ([place])   . Type 2 diabetes, controlled, with peripheral neuropathy ([place])   . Urge incontinence of urine      SOCIAL HISTORY Social History   Socioeconomic History  . Marital status: Widowed    Spouse name: Not on file  . Number of children: Not on file  . Years of education: Not on file  . Highest education level: Not on file  Occupational History  . Not on file  Tobacco Use  . Smoking status: Former Smoker    Quit  date: 03/14/2009    Years since quitting: 10.9  . Smokeless tobacco: Never Used  . Tobacco comment: quit smoking 2010  Substance and Sexual Activity  . Alcohol use: No  . Drug use: No  . Sexual activity: Not on file  Other Topics Concern  . Not on file  Social History Narrative   Lives alone.  2 daughters and 1 granddaughter in [place]   Social Determinants of Health   Financial Resource Strain:   . Difficulty of Paying Living Expenses:   Food Insecurity:   . Worried About Charity fundraiser in the Last Year:   . Arboriculturist in the Last Year:   Transportation Needs:   . Film/video editor (Medical):   [name] Lack of Transportation (Non-Medical):   Physical Activity:   . Days of Exercise per Week:   . Minutes of Exercise per Session:   Stress:   . Feeling of Stress :   Social Connections:   . Frequency of Communication with Friends and Family:   . Frequency of Social Gatherings with Friends and Family:   . Attends Religious Services:   . Active Member of Clubs or Organizations:   . Attends Archivist Meetings:   [name] Marital Status:   Intimate Partner Violence:   . Fear of Current or Ex-Partner:   . Emotionally Abused:   [name] Physically Abused:   . Sexually Abused:      FAMILY HISTORY Family History  Problem Relation Age of Onset  . Colon polyps Daughter   . Colon cancer Neg Hx   . Esophageal cancer Neg Hx   . Rectal cancer Neg Hx   . Stomach cancer Neg Hx      Review of Systems: 12 systems reviewed Otherwise as per HPI, all other systems reviewed and negative  Physical Exam: Vitals:   03/04/20 1235 03/04/20 1415  BP:  (!) 118/55  Pulse: (!) 58 (!) 58  Resp:  (!) 21  Temp:    SpO2: 96% 97%  No intake/output data recorded. No intake or output data in the 24 hours ending 03/04/20 1419 General: well-appearing, no acute distress HEENT: anicteric sclera, oropharynx clear without lesions CV: regular rate, normal rhythm, no murmurs, no gallops, no  rubs. +jvd Lungs: bibasilar crackles, unlabored, bl chest expansion Abd: soft, non-tender, non-distended Skin: no visible lesions or rashes Psych: alert, engaged, appropriate mood and affect Musculoskeletal: 3+ pitting edema bl le's up to thighs Neuro: normal speech, no gross focal deficits   Test Results Reviewed Lab Results  Component Value Date   NA 135 03/03/2020   K 4.8 03/03/2020   CL 97 (L) 03/03/2020   CO2 26 03/03/2020   BUN 58 (H) 03/03/2020   CREATININE 3.42 (H) 03/03/2020   CALCIUM 9.1 03/03/2020   ALBUMIN 3.7 03/04/2020   PHOS 5.8 (H) 03/04/2020     I have reviewed all relevant outside healthcare records related to the patient's kidney injury.

## 2020-03-05 ENCOUNTER — Encounter (HOSPITAL_COMMUNITY): Payer: Self-pay | Admitting: Internal Medicine

## 2020-03-05 ENCOUNTER — Encounter (HOSPITAL_COMMUNITY): Payer: Medicare PPO

## 2020-03-05 ENCOUNTER — Inpatient Hospital Stay (HOSPITAL_COMMUNITY): Payer: Medicare PPO

## 2020-03-05 DIAGNOSIS — I509 Heart failure, unspecified: Secondary | ICD-10-CM

## 2020-03-05 DIAGNOSIS — I5021 Acute systolic (congestive) heart failure: Secondary | ICD-10-CM | POA: Diagnosis not present

## 2020-03-05 LAB — GLUCOSE, CAPILLARY
Glucose-Capillary: 172 mg/dL — ABNORMAL HIGH (ref 70–99)
Glucose-Capillary: 140 mg/dL — ABNORMAL HIGH (ref 70–99)
Glucose-Capillary: 155 mg/dL — ABNORMAL HIGH (ref 70–99)
Glucose-Capillary: 134 mg/dL — ABNORMAL HIGH (ref 70–99)

## 2020-03-05 LAB — CBC WITH DIFFERENTIAL/PLATELET
Abs Immature Granulocytes: 0.01 10*3/uL (ref 0.00–0.07)
Basophils Absolute: 0 10*3/uL (ref 0.0–0.1)
Basophils Relative: 1 %
Eosinophils Absolute: 0.2 10*3/uL (ref 0.0–0.5)
Eosinophils Relative: 4 %
HCT: 31.5 % — ABNORMAL LOW (ref 36.0–46.0)
Hemoglobin: 9.4 g/dL — ABNORMAL LOW (ref 12.0–15.0)
Immature Granulocytes: 0 %
Lymphocytes Relative: 21 %
Lymphs Abs: 0.8 10*3/uL (ref 0.7–4.0)
MCH: 27.4 pg (ref 26.0–34.0)
MCHC: 29.8 g/dL — ABNORMAL LOW (ref 30.0–36.0)
MCV: 91.8 fL (ref 80.0–100.0)
Monocytes Absolute: 0.6 10*3/uL (ref 0.1–1.0)
Monocytes Relative: 15 %
Neutro Abs: 2.2 10*3/uL (ref 1.7–7.7)
Neutrophils Relative %: 59 %
Platelets: 220 10*3/uL (ref 150–400)
RBC: 3.43 MIL/uL — ABNORMAL LOW (ref 3.87–5.11)
RDW: 16.9 % — ABNORMAL HIGH (ref 11.5–15.5)
WBC: 3.7 10*3/uL — ABNORMAL LOW (ref 4.0–10.5)
nRBC: 0 % (ref 0.0–0.2)

## 2020-03-05 LAB — ECHOCARDIOGRAM LIMITED
Area-P 1/2: 2.95 cm2
Calc EF: 49 %
Height: 62 in
S' Lateral: 4.4 cm
Single Plane A2C EF: 44.7 %
Single Plane A4C EF: 52.9 %
Weight: 2744.29 oz

## 2020-03-05 LAB — BASIC METABOLIC PANEL
Anion gap: 11 (ref 5–15)
BUN: 60 mg/dL — ABNORMAL HIGH (ref 8–23)
CO2: 28 mmol/L (ref 22–32)
Calcium: 9.3 mg/dL (ref 8.9–10.3)
Chloride: 100 mmol/L (ref 98–111)
Creatinine, Ser: 2.91 mg/dL — ABNORMAL HIGH (ref 0.44–1.00)
GFR calc Af Amer: 17 mL/min — ABNORMAL LOW (ref >60)
GFR calc non Af Amer: 15 mL/min — ABNORMAL LOW (ref >60)
Glucose, Bld: 102 mg/dL — ABNORMAL HIGH (ref 70–99)
Potassium: 4 mmol/L (ref 3.5–5.1)
Sodium: 139 mmol/L (ref 135–145)

## 2020-03-05 LAB — CBG MONITORING, ED: Glucose-Capillary: 95 mg/dL (ref 70–99)

## 2020-03-05 LAB — MAGNESIUM: Magnesium: 2.6 mg/dL — ABNORMAL HIGH (ref 1.7–2.4)

## 2020-03-05 LAB — UREA NITROGEN, URINE: Urea Nitrogen, Ur: 346 mg/dL

## 2020-03-05 MED ORDER — PERFLUTREN LIPID MICROSPHERE
1.0000 mL | INTRAVENOUS | Status: AC | PRN
  Administered 2020-03-05: 2 mL via INTRAVENOUS
  Filled 2020-03-05: qty 10

## 2020-03-05 NOTE — Progress Notes (Signed)
PROGRESS NOTE    [name]  JAS:[id number] DOB: 09/06/1940 DOA: 03/03/2020 PCP: Girtha Rm, NP-C    Brief Narrative:  79 y.o. female with medical history significant of HTN, COPD, systolic CHF followed by Dr. [name], PAH, nonobstructive CAD, DM type II controlled, CKD stage IV followed by Dr. [name], and OSA on CPAP presented with progressively worsening shortness of breath over the last 4 days.  Just recently hospitalized 7/25-8/5 for acute on chronic systolic congestive heart failure with EF found to be 45 to 50% with global hypokinesis and elevated pulmonary artery pressures.  Prior to that hospitalization patient only used oxygen at night with CPAP.  She reported progressively worsening swelling of her lower extremities, weight gain, and had been wearing 2 L nasal cannula oxygen continuously.  Denies any recent sick contacts, chest pain, nausea, vomiting, or diarrhea.  Due to her symptoms they have followed up with her providers and have been recommended to increase to 60 mg of torsemide daily which she had done for the last 3 days without any improvement in symptoms.  Urine outputs were noted to be minimal.   ED Course: Upon admission into the emergency department patient was seen to be afebrile with systolic blood pressures initially as low as 86/50, and O2 saturations maintained on 4-6 L of nasal.  Labs were significant for hemoglobin 9.4, BUN 58, creatinine 3.42, glucose 200, and BNP 466.5.  Chest x-ray significant for cardiomegaly with mild vascular congestion.  Patient was ordered 60 mg of Lasix IV.  TRH called to admit.  Assessment & Plan:   Principal Problem:   Acute on chronic systolic CHF (congestive heart failure) (HCC) Active Problems:   OSA on CPAP   ICD (implantable cardioverter-defibrillator) in place   PAH (pulmonary artery hypertension) ([place])   Hypercholesterolemia   Acute on chronic respiratory failure with hypoxia (HCC)   Diabetes mellitus type 2,  diet-controlled (HCC)   Transient hypotension   Acute renal failure superimposed on stage 4 chronic kidney disease (HCC)  Acute on chronic respiratory failure with hypoxia secondary to systolic congestive heart failure: Patient presents with progressively worsening shortness of breath and lower extremity swelling.  Last EF noted to be 45-50% with global hypokinesis and signs of cor pulmonale on 7/26.  BNP was elevated at 466.5.  Chest x-ray showing cardiomegaly with vascular congestion.  Ordered to be given 60 mg Lasix IV at time of presentation -Continuous pulse oximetry with nasal cannula oxygen as needed to keep O2 saturations >92% -Strict I&Os and daily weights -Elevate lower extremities -Cardiology consulted. Rec to continue lasix 79m IV bid -Repeat bmet in AM  Transient hypotension: Home medications include amlodipine 10 mg daily, Coreg 25 mg twice daily, and torsemide 60 mg daily. -Currently holding home blood pressure medications while attempting to diurese  -BP well controlled at this time  Acute renal failure superimposed on chronic kidney disease stage IV: Patient presents with creatinine elevated up to 3.42 with BUN 58.  Baseline creatinine previously noted to be around 2.  Question possibility of a cardiorenal syndrome.  [facility]. -Nephrology following. -Cr improving with diuresis -recheck bmet in AM  Restrictive lung disease/chronic respiratory insufficiency: Prior PFTs had shown significant restrictive lung disease with marked reduced DLCO.   -Continue budesonide nebs and duo nebs as needed for shortness of breath/wheezing -Breathing comfortably this AM  Complete heart block s/p ICD   Pulmonary artery hypertension: Thought secondary  to chronic restrictive lung disease and obstructive sleep apnea.  Right heart catheter from 7/29 showed severe pulmonary artery hypertension. -Held  sildenafil at time of admit -Repeat 2d echo per Cardiology  Diabetes mellitus type 2: Patient appears relatively well controlled as last hemoglobin A1c 6.5 on 7/29.  On admission glucose elevated up to 200. -Hypoglycemic protocol -Continue with CBGs q. AC with a very sensitive sliding scale insulin  Anemia of chronic disease: Hemoglobin 9.4 which appears near patient's baseline.  Hyperlipidemia -Continue atorvastatin as tolerated  Debility: Patient has been in rehab since her last hospitalization.  Obstructive sleep apnea on CPAP -Continue CPAP nightly  DVT prophylaxis: heparin subq Code Status: Full Family Communication: Pt in room, daughter at bedside  Status is: Inpatient  Remains inpatient appropriate because:Ongoing diagnostic testing needed not appropriate for outpatient work up, Unsafe d/c plan and IV treatments appropriate due to intensity of illness or inability to take PO   Dispo: The patient is from: Home              Anticipated d/c is to: Home              Anticipated d/c date is: 3 days              Patient currently is not medically stable to d/c.       Consultants:   Cardiology  Nephrology  Procedures:     Antimicrobials: Anti-infectives (From admission, onward)   None       Subjective: Feels better today. LE swelling improving  Objective: Vitals:   03/05/20 0715 03/05/20 0800 03/05/20 0915 03/05/20 1118  BP: (!) 105/53 102/80 109/60 (!) 112/48  Pulse: 61 62 61 60  Resp: _0 Temp:   97.9 F (36.6 C) 98.1 F (36.7 C)  TempSrc:   Oral Oral  SpO2: 98% 95% 100% 100%  Weight:   77.8 kg   Height:   _1  (1.575 m)     Intake/Output Summary (Last 24 hours) at 03/05/2020 1517 Last data filed at 03/05/2020 1422 Gross per 24 hour  Intake 240 ml  Output 1850 ml  Net -1610 ml   Filed Weights   03/05/20 0915  Weight: 77.8 kg    Examination: General exam: Awake, laying in bed, in nad Respiratory system: Normal  respiratory effort, no wheezing Cardiovascular system: regular rate, s1, s2 Gastrointestinal system: Soft, nondistended, positive BS Central nervous system: CN2-12 grossly intact, strength intact Extremities: Perfused, no clubbing Skin: Normal skin turgor, no notable skin lesions seen Psychiatry: Mood normal // no visual hallucinations   Data Reviewed: I have personally reviewed following labs and imaging studies  CBC: Recent Labs  Lab 03/03/20 1718 03/05/20 0402  WBC 4.0 3.7*  NEUTROABS  --  2.2  HGB 9.4* 9.4*  HCT 31.8* 31.5*  MCV 90.9 91.8  PLT 226 763   Basic Metabolic Panel: Recent Labs  Lab 03/03/20 1718 03/04/20 1010 03/05/20 0402  NA 135  --  139  K 4.8  --  4.0  CL 97*  --  100  CO2 26  --  28  GLUCOSE 200*  --  102*  BUN 58*  --  60*  CREATININE 3.42*  --  2.91*  CALCIUM 9.1  --  9.3  MG  --  2.5* 2.6*  PHOS  --  5.8*  --    GFR: Estimated Creatinine Clearance: 14.6 mL/min (A) (by C-G formula based on SCr of 2.91 mg/dL (H)). Liver Function  Tests: Recent Labs  Lab 03/04/20 1010  AST 16  ALT 15  ALKPHOS 109  BILITOT 1.0  PROT 7.1  ALBUMIN 3.7   No results for input(s): LIPASE, AMYLASE in the last 168 hours. No results for input(s): AMMONIA in the last 168 hours. Coagulation Profile: No results for input(s): INR, PROTIME in the last 168 hours. Cardiac Enzymes: No results for input(s): CKTOTAL, CKMB, CKMBINDEX, TROPONINI in the last 168 hours. BNP (last 3 results) No results for input(s): PROBNP in the last 8760 hours. HbA1C: No results for input(s): HGBA1C in the last 72 hours. CBG: Recent Labs  Lab 03/04/20 1921 03/04/20 2245 03/05/20 0817 03/05/20 1114  GLUCAP 172* 176* 95 140*   Lipid Profile: No results for input(s): CHOL, HDL, LDLCALC, TRIG, CHOLHDL, LDLDIRECT in the last 72 hours. Thyroid Function Tests: No results for input(s): TSH, T4TOTAL, FREET4, T3FREE, THYROIDAB in the last 72 hours. Anemia Panel: No results for input(s):  VITAMINB12, FOLATE, FERRITIN, TIBC, IRON, RETICCTPCT in the last 72 hours. Sepsis Labs: No results for input(s): PROCALCITON, LATICACIDVEN in the last 168 hours.  Recent Results (from the past 240 hour(s))  SARS Coronavirus 2 by RT PCR (hospital order, performed in [facility] hospital lab) Nasopharyngeal Nasopharyngeal Swab     Status: None   Collection Time: 03/04/20  8:38 AM   Specimen: Nasopharyngeal Swab  Result Value Ref Range Status   SARS Coronavirus 2 NEGATIVE NEGATIVE Final    Comment: (NOTE) SARS-CoV-2 target nucleic acids are NOT DETECTED.  The SARS-CoV-2 RNA is generally detectable in upper and lower respiratory specimens during the acute phase of infection. The lowest concentration of SARS-CoV-2 viral copies this assay can detect is 250 copies / mL. A negative result does not preclude SARS-CoV-2 infection and should not be used as the sole basis for treatment or other patient management decisions.  A negative result may occur with improper specimen collection / handling, submission of specimen other than nasopharyngeal swab, presence of viral mutation(s) within the areas targeted by this assay, and inadequate number of viral copies (<250 copies / mL). A negative result must be combined with clinical observations, patient history, and epidemiological information.  Fact Sheet for Patients:   StrictlyIdeas.no  Fact Sheet for Healthcare Providers: BankingDealers.co.za  This test is not yet approved or  cleared by the [place] FDA and has been authorized for detection and/or diagnosis of SARS-CoV-2 by FDA under an Emergency Use Authorization (EUA).  This EUA will remain in effect (meaning this test can be used) for the duration of the COVID-19 declaration under Section 564(b)(1) of the Act, 21 U.S.C. section 360bbb-3(b)(1), unless the authorization is terminated or revoked sooner.  Performed at [facility],  [place] [address], [place], [place] [address]      Radiology Studies: DG Chest 2 View  Result Date: 03/03/2020 CLINICAL DATA:  Shortness of breath and lower extremity edema. EXAM: CHEST - 2 VIEW COMPARISON:  02/19/2020 FINDINGS: Left-sided pacemaker unchanged. Lungs are somewhat hypoinflated without focal airspace consolidation or effusion. Subtle hazy prominence of the central perihilar markings likely mild vascular congestion. Stable moderate cardiomegaly. Remainder of the exam is unchanged. IMPRESSION: Cardiomegaly with suggestion of minimal vascular congestion. Electronically Signed   By: Marin Olp M.D.   On: 03/03/2020 17:31   US RENAL  Result Date: 03/04/2020 CLINICAL DATA:  Acute kidney injury.  CKD. EXAM: RENAL / URINARY TRACT ULTRASOUND COMPLETE COMPARISON:  12/22/2017 FINDINGS: Right Kidney: Renal measurements: 9.2 x 3.6 x 4.8 cm =  volume: 83 mL . Echogenicity within normal limits. No mass or hydronephrosis visualized. Left Kidney: Renal measurements: 9.2 x 4.8 x 4.5 cm = volume: 105 mL. Echogenicity within normal limits. No mass or hydronephrosis visualized. Bladder: Appears normal for degree of bladder distention. Other: None. IMPRESSION: Unremarkable ultrasound of the kidneys and bladder. No evidence of obstructive uropathy. Electronically Signed   By: Davina Poke D.O.   On: 03/04/2020 16:19   ECHOCARDIOGRAM LIMITED  Result Date: 03/05/2020    ECHOCARDIOGRAM LIMITED REPORT   Patient Name:   TIANA SIVERTSON Date of Exam: 03/05/2020 Medical Rec #:  [id number]       Height:       62.0 in Accession #:    [id number]      Weight:       171.5 lb Date of Birth:  03/01/1941        BSA:          1.791 m Patient Age:    41 years        BP:           109/60 mmHg Patient Gender: F               HR:           61 bpm. Exam Location:  Inpatient Procedure: Limited Echo, Intracardiac Opacification Agent, Limited Color Doppler            and Cardiac Doppler Indications:    CHF-Acute Systolic 413.24 / M01.02   History:        Patient has prior history of Echocardiogram examinations, most                 recent 02/12/2020. Defibrillator, Pulmonary HTN and COPD,                 Signs/Symptoms:Hypotension; Risk Factors:Former Smoker,                 Diabetes, Hypertension and Sleep Apnea. Acute on chronic                 respiratory failure secondary to systolic CHF, Cor Pulmonale,                 Chronic kidney disease, secondary anemia.  Sonographer:    Darlina Sicilian RDCS Referring Phys: [id number] [place]  1. Left ventricular ejection fraction, by estimation, is 45 to 50%. The left ventricle has mildly decreased function. The left ventricle demonstrates regional wall motion abnormalities. The left ventricular internal cavity size was mildly dilated. There  is the interventricular septum is flattened in systole and diastole, consistent with right ventricular pressure and volume overload.  2. Right ventricular systolic function is moderately reduced. The right ventricular size is moderately enlarged. There is moderately elevated pulmonary artery systolic pressure. The estimated right ventricular systolic pressure is 40.3 mmHg.  3. Right atrial size was severely dilated.  4. The mitral valve is normal in structure. Trivial mitral valve regurgitation.  5. The tricuspid valve is abnormal. 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  6. The aortic valve is tricuspid. Aortic valve regurgitation is not visualized. No aortic stenosis is present.  7. The inferior vena cava is dilated in size with <50% respiratory variability, suggesting right atrial pressure of 15 mmHg. FINDINGS  Left Ventricle: Left ventricular ejection fraction, by estimation, is 45 to 50%. The left ventricle has mildly decreased function. The left ventricle demonstrates regional wall motion abnormalities. Definity contrast agent was given IV  to delineate the left ventricular endocardial borders. The left ventricular internal cavity  size was mildly dilated. There is no left ventricular hypertrophy. The interventricular septum is flattened in systole and diastole, consistent with right ventricular pressure and  volume overload. Right Ventricle: The right ventricular size is moderately enlarged. Right ventricular systolic function is moderately reduced. There is moderately elevated pulmonary artery systolic pressure. The tricuspid regurgitant velocity is 2.84 m/s, and with an assumed right atrial pressure of 15 mmHg, the estimated right ventricular systolic pressure is 17.7 mmHg. Right Atrium: Right atrial size was severely dilated. Mitral Valve: The mitral valve is normal in structure. Trivial mitral valve regurgitation. Tricuspid Valve: The tricuspid valve is abnormal. 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 Aortic Valve: The aortic valve is tricuspid. Aortic valve regurgitation is not visualized. No aortic stenosis is present. Venous: The inferior vena cava is dilated in size with less than 50% respiratory variability, suggesting right atrial pressure of 15 mmHg. LEFT VENTRICLE PLAX 2D LVIDd:         5.55 cm      Diastology LVIDs:         4.40 cm      LV e' lateral:   7.56 cm/s LV PW:         1.00 cm      LV E/e' lateral: 8.7 LV IVS:        0.85 cm      LV e' medial:    4.05 cm/s                             LV E/e' medial:  16.2  LV Volumes (MOD) LV vol d, MOD A2C: 163.0 ml LV vol d, MOD A4C: 139.0 ml LV vol s, MOD A2C: 90.1 ml LV vol s, MOD A4C: 65.4 ml LV SV MOD A2C:     72.9 ml LV SV MOD A4C:     139.0 ml LV SV MOD BP:      73.8 ml LEFT ATRIUM         Index      RIGHT ATRIUM           Index LA diam:    4.60 cm 2.57 cm/m RA Area:     28.60 cm                                RA Volume:   106.00 ml 59.19 ml/m  AORTIC VALVE LVOT Vmax:   82.20 cm/s LVOT Vmean:  53.800 cm/s LVOT VTI:    0.190 m MITRAL VALVE               TRICUSPID VALVE MV Area (PHT): 2.95 cm    TR Peak grad:   32.3 mmHg MV Decel Time: 257 msec    TR Vmax:         284.00 cm/s MV E velocity: 65.60 cm/s MV A velocity: 69.00 cm/s  SHUNTS MV E/A ratio:  0.95        Systemic VTI: 0.19 m Oswaldo Milian MD Electronically signed by Oswaldo Milian MD Signature Date/Time: 03/05/2020/2:36:28 PM    Final     Scheduled Meds: . aspirin EC  81 mg Oral QHS  . atorvastatin  40 mg Oral Daily  . budesonide  0.5 mg Nebulization BID  . furosemide  80 mg Intravenous BID  . heparin  5,000 Units Subcutaneous  Q8H  . insulin aspart  0-6 Units Subcutaneous TID WC  . sodium chloride flush  3 mL Intravenous Q12H   Continuous Infusions: . sodium chloride       LOS: 1 day   Marylu Lund, MD [facility] Pager On Amion  If 7PM-7AM, please contact night-coverage 03/05/2020, 3:17 PM

## 2020-03-05 NOTE — ED Notes (Signed)
Pt c.o shoulder pain , will medicate per [facility]

## 2020-03-05 NOTE — Telephone Encounter (Signed)
Received Haynes Bast Ottinger

## 2020-03-05 NOTE — Progress Notes (Signed)
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 Follows with Dr. [name]. Admit Cr 3.4. -AKI likely secondary to cardiorenal syndrome and hypotension leading to decreased EABV/renal perfusion. Cr 3.4>2.9 today -continue with lasix 52m IV BID for now -renal u/s without obstruction -UA bland -Currently no indication for HD -hopeful for further recovery will see where her kidney function plateaus -Continue to monitor daily Cr, Dose meds for GFR<15 -Monitor Daily I/Os, Daily weight  -Maintain MAP>65 for optimal renal perfusion.   Acute on chronic respiratory failure secondary to systolic CHF -Lasix as above.  Monitor I's and O's and daily weights.  Consider Foley if unable to obtain accurate output. Would probably end up doing torsemide 845mdaily on discharge (equivalent of lasix 16053motal which she is receiving currently) -appreciate assistance from cardiology.  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 Repeat echo pending  Hypotension, likely med effect from sildenafil -Holding amlodipine and Coreg  Chronic respiratory insufficiency with restrictive lung disease and OSA: Nebs as needed.  CPAP nightly  Anemia of chronic disease -Transfuse for Hgb<7 g/dL.  Hemoglobin currently stable, monitor for now. If worsening, check iron panel  Diabetes Mellitus type 2: mgmt per primary service  Recommendations conveyed to primary service.   VikGean QuintD [facility] 03/05/2020, 9:54 AM    Subjective:   No acute events. Patient reports that she is urinating more. Emptied 800cc this AM (purewick). Breathing is about the same but slightly better. Legs still feel heavy. No other complaints.   Objective:   BP 109/60 (BP Location: Left Arm)   Pulse 61    Temp 97.9 F (36.6 C) (Oral)   Resp 18   Ht _0  (1.575 m)   Wt 77.8 kg   SpO2 100%   BMI 31.37 kg/m   Intake/Output Summary (Last 24 hours) at 03/05/2020 0954 Last data filed at 03/05/2020 0820 Gross per 24 hour  Intake --  Output 1150 ml  Net -1150 ml   Weight change:   Physical Exam: Gen:NAD, sitting slightly up in bed CVS:s1s2, +murmur, no r/g, +JVD Resp:diminished air entry with slight bibasilar rales, unlabored, bl chest expansion, on [place] AbdZSM:OLMBt/nd Ext:2+ pitting edema bl le's Skin: no rash Neuro: awake, alert, oriented, speech clear and coherent, moves all ext spontaneously  Imaging: DG Chest 2 View  Result Date: 03/03/2020 CLINICAL DATA:  Shortness of breath and lower extremity edema. EXAM: CHEST - 2 VIEW COMPARISON:  02/19/2020 FINDINGS: Left-sided pacemaker unchanged. Lungs are somewhat hypoinflated without focal airspace consolidation or effusion. Subtle hazy prominence of the central perihilar markings likely mild vascular congestion. Stable moderate cardiomegaly. Remainder of the exam is unchanged. IMPRESSION: Cardiomegaly with suggestion of minimal vascular congestion. Electronically Signed   By: DanMarin OlpD.   On: 03/03/2020 17:31   [place]  Result Date: 03/04/2020 CLINICAL DATA:  Acute kidney injury.  CKD. EXAM: RENAL / URINARY TRACT ULTRASOUND COMPLETE COMPARISON:  12/22/2017 FINDINGS: Right Kidney: Renal measurements: 9.2 x 3.6 x 4.8 cm = volume: 83 mL . Echogenicity within normal limits. No mass or hydronephrosis visualized. Left Kidney: Renal measurements: 9.2 x 4.8 x 4.5 cm = volume: 105 mL. Echogenicity within normal limits. No mass or hydronephrosis visualized. Bladder: Appears normal for degree of bladder distention. Other: None. IMPRESSION: Unremarkable ultrasound of the kidneys and bladder. No evidence of obstructive uropathy.  Electronically Signed   By: Davina Poke D.O.   On: 03/04/2020 16:19    Labs: BMET Recent Labs  Lab  03/03/20 1718 03/04/20 1010 03/05/20 0402  NA 135  --  139  K 4.8  --  4.0  CL 97*  --  100  CO2 26  --  28  GLUCOSE 200*  --  102*  BUN 58*  --  60*  CREATININE 3.42*  --  2.91*  CALCIUM 9.1  --  9.3  PHOS  --  5.8*  --    CBC Recent Labs  Lab 03/03/20 1718 03/05/20 0402  WBC 4.0 3.7*  NEUTROABS  --  2.2  HGB 9.4* 9.4*  HCT 31.8* 31.5*  MCV 90.9 91.8  PLT 226 220    Medications:    . aspirin EC  81 mg Oral QHS  . atorvastatin  40 mg Oral Daily  . budesonide  0.5 mg Nebulization BID  . furosemide  80 mg Intravenous BID  . heparin  5,000 Units Subcutaneous Q8H  . insulin aspart  0-6 Units Subcutaneous TID WC  . sodium chloride flush  3 mL Intravenous Q12H

## 2020-03-05 NOTE — TOC Initial Note (Signed)
Transition of Care [facility]) - Initial/Assessment Note    Patient Details  Name: [name] MRN: [id number] Date of Birth: Dec 03, 1940  Transition of Care [facility]) CM/SW Contact:    [name], [place] Phone Number: 03/05/2020, 11:25 AM  Clinical Narrative:                 CSW received consult for discharge planning. CSW spoke with patient and her daughter [name] regarding regarding whether or not patient would like to return to [facility]. Patient reported that she would like home health services due to having a bad experience with [facility].  Patient reports preference for [facility] since she has used them before and liked their care. CSW will provided Medicare [place] ratings list. CSW discussed equipment needs with patient and she requested a bedside commode, oxygen, and concentrator. Patient's daughter requested assistance in obtaining a CPAP machine as patient's CPAP is broken. Patient was scheduled for a sleep study on 02/29/20 but missed her appointment due to being in rehab. CSW confirmed PCP and address with patient. Patient's daughter states that she will pick her up at discharge. No further questions reported at this time. CSW to continue to follow and assist with discharge planning needs.  Expected Discharge Plan: Acute to Acute Transfer Barriers to Discharge: Insurance Authorization, Continued Medical Work up   Patient Goals and CMS Choice Patient states their goals for this hospitalization and ongoing recovery are:: To get better soon. CMS Medicare.gov Compare Post Acute Care list provided to:: Patient Choice offered to / list presented to : NA  Expected Discharge Plan and Services Expected Discharge Plan: Acute to Acute Transfer In-house Referral: Clinical Social Work   Post Acute Care Choice: [place] arrangements for the past 2 months: [place]                                      Prior Living Arrangements/Services Living arrangements  for the past 2 months: [place] Lives with:: Self Patient language and need for interpreter reviewed:: Yes Do you feel safe going back to the place where you live?: Yes      Need for Family Participation in Patient Care: No (Comment) Care giver support system in place?: Yes (comment) Current home services: DME Criminal Activity/Legal Involvement Pertinent to Current Situation/Hospitalization: No - Comment as needed  Activities of Daily Living Home Assistive Devices/Equipment: Cane (specify quad or straight), CPAP, Walker (specify type), Eyeglasses ADL Screening (condition at time of admission) Patient's cognitive ability adequate to safely complete daily activities?: Yes Is the patient deaf or have difficulty hearing?: No Does the patient have difficulty seeing, even when wearing glasses/contacts?: No Does the patient have difficulty concentrating, remembering, or making decisions?: No Patient able to express need for assistance with ADLs?: Yes Does the patient have difficulty dressing or bathing?: No Independently performs ADLs?: Yes (appropriate for developmental age) Does the patient have difficulty walking or climbing stairs?: Yes Weakness of Legs: Both Weakness of Arms/Hands: None  Permission Sought/Granted Permission sought to share information with : Family Supports Permission granted to share information with : Yes, Verbal Permission Granted  Share Information with NAME: [name] [phone]     Permission granted to share info w Relationship: Daughter  Permission granted to share info w Contact Information: [phone]  Emotional Assessment Appearance:: Appears stated age Attitude/Demeanor/Rapport: Engaged Affect (typically observed): Pleasant  Orientation: : Oriented to Self, Oriented to Place, Oriented to  Time, Oriented to Situation Alcohol / Substance Use: Not Applicable Psych Involvement: No (comment)  Admission diagnosis:  Dyspnea on  exertion [R06.00] AKI (acute kidney injury) ([place]) [N17.9] Acute on chronic systolic CHF (congestive heart failure) (HCC) [I50.23] Acute on chronic congestive heart failure, unspecified heart failure type [facility]) [I50.9] Patient Active Problem List   Diagnosis Date Noted  . Acute on chronic systolic CHF (congestive heart failure) ([place]) 03/04/2020  . Transient hypotension 03/04/2020  . Acute renal failure superimposed on stage 4 chronic kidney disease ([place]) 03/04/2020  . Pulmonary hypertension, unspecified ([place])   . Acute on chronic combined systolic and diastolic CHF (congestive heart failure) ([place]) 02/12/2020  . Acute on chronic respiratory failure with hypoxia ([place]) 02/12/2020  . CHF exacerbation ([place]) 02/12/2020  . D dimer value normal   . Diabetes mellitus type 2, diet-controlled ([place])   . Abnormal result of cardiovascular function study, unspecified 09/16/2018  . Callus 09/16/2018  . Claudication of both lower extremities ([place]) 09/16/2018  . Dependence on other enabling machines and devices 09/16/2018  . Dizziness 09/16/2018  . Dyspnea, unspecified 09/16/2018  . Medicare annual wellness visit, subsequent 09/16/2018  . Onychomycosis due to dermatophyte 09/16/2018  . Overgrown toenails 09/16/2018  . Other secondary pulmonary hypertension ([place]) 09/16/2018  . Iron deficiency anemia   . On home O2   . Benign neoplasm of colon   . PAH (pulmonary artery hypertension) ([place]) 09/09/2017  . PAT (paroxysmal atrial tachycardia) ([place]) 09/09/2017  . Hypercholesterolemia 09/09/2017  . OSA on CPAP 09/08/2017  . 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 Type 2 diabetes, controlled, with peripheral neuropathy ([place])   . Other cardiomyopathies ([place])   . Chronic combined systolic (congestive) and diastolic (congestive) heart failure ([place])   . CKD (chronic kidney disease) stage 4, GFR 15-29 ml/min (HCC)   . ICD (implantable cardioverter-defibrillator) in place   . Urge incontinence of urine    . Coronary artery disease involving native coronary artery of native heart without angina pectoris   . Chronic kidney disease 09/06/2017  . Restrictive lung disease 05/04/2017  . Body mass index (bmi) 33.0-33.9, adult 06/13/2014  . DEPRESSION 07/26/2007  . Essential hypertension 07/26/2007  . CARDIOMYOPATHY 07/26/2007  . COPD (chronic obstructive pulmonary disease) ([place]) 07/26/2007   PCP:  Girtha Rm, NP-C Pharmacy:   [facility] [phone] [place], [place] [address] [place] [address] Phone: [phone] Fax: [facility] [place], [place] [address] Phone: [phone] Fax: [phone]     Social Determinants of Health (SDOH) Interventions    Readmission Risk Interventions No flowsheet data found.

## 2020-03-05 NOTE — ED Notes (Signed)
Ordered breakfast 

## 2020-03-05 NOTE — Plan of Care (Signed)
  Problem: Education: Goal: Ability to demonstrate management of disease process will improve Outcome: Progressing Goal: Ability to verbalize understanding of medication therapies will improve Outcome: Progressing Goal: Individualized Educational Video(s) Outcome: Progressing   Problem: Activity: Goal: Capacity to carry out activities will improve Outcome: Progressing   Problem: Cardiac: Goal: Ability to achieve and maintain adequate cardiopulmonary perfusion will improve Outcome: Progressing   Problem: Education: Goal: Ability to describe self-care measures that may prevent or decrease complications (Diabetes Survival Skills Education) will improve Outcome: Progressing Goal: Individualized Educational Video(s) Outcome: Progressing   Problem: Coping: Goal: Ability to adjust to condition or change in health will improve Outcome: Progressing   Problem: Fluid Volume: Goal: Ability to maintain a balanced intake and output will improve Outcome: Progressing   Problem: Health Behavior/Discharge Planning: Goal: Ability to identify and utilize available resources and services will improve Outcome: Progressing Goal: Ability to manage health-related needs will improve Outcome: Progressing   Problem: Metabolic: Goal: Ability to maintain appropriate glucose levels will improve Outcome: Progressing   Problem: Nutritional: Goal: Maintenance of adequate nutrition will improve Outcome: Progressing Goal: Progress toward achieving an optimal weight will improve Outcome: Progressing   Problem: Skin Integrity: Goal: Risk for impaired skin integrity will decrease Outcome: Progressing   Problem: Tissue Perfusion: Goal: Adequacy of tissue perfusion will improve Outcome: Progressing

## 2020-03-05 NOTE — Progress Notes (Signed)
  Echocardiogram 2D Echocardiogram limited with definity has been performed.  Darlina Sicilian M 03/05/2020, 11:19 AM

## 2020-03-05 NOTE — Progress Notes (Signed)
Cardiology Progress Note  Patient ID: [name] MRN: [id number] DOB: [birth year] Date of Encounter: 03/05/2020  Primary Cardiologist: Sanda Klein, MD  Subjective   Chief Complaint: Still short of breath  HPI: Still short of breath this morning.  Good urine output.  Creatinine is coming down.  ROS:  All other ROS reviewed and negative. Pertinent positives noted in the HPI.     Inpatient Medications  Scheduled Meds: . aspirin EC  81 mg Oral QHS  . atorvastatin  40 mg Oral Daily  . budesonide  0.5 mg Nebulization BID  . furosemide  80 mg Intravenous BID  . heparin  5,000 Units Subcutaneous Q8H  . insulin aspart  0-6 Units Subcutaneous TID WC  . sodium chloride flush  3 mL Intravenous Q12H   Continuous Infusions: . sodium chloride     PRN Meds: sodium chloride, acetaminophen, ipratropium-albuterol, ondansetron (ZOFRAN) IV, sodium chloride flush   Vital Signs   Vitals:   03/05/20 0545 03/05/20 0625 03/05/20 0715 03/05/20 0800  BP: 107/77  (!) 105/53 102/80  Pulse: 61 (!) 58 61 62  Resp: _0 Temp:      TempSrc:      SpO2: 94% 96% 98% 95%    Intake/Output Summary (Last 24 hours) at 03/05/2020 0850 Last data filed at 03/05/2020 0820 Gross per 24 hour  Intake --  Output 1150 ml  Net -1150 ml   Last 3 Weights 02/22/2020 02/21/2020 02/20/2020  Weight (lbs) 167 lb 167 lb 167 lb 1.7 oz  Weight (kg) 75.751 kg 75.751 kg 75.8 kg      Telemetry  Overnight telemetry shows sinus rhythm with heart rate in the 60s, appears to be a paced, intermittent V pacing noted, which I personally reviewed.   ECG  The most recent ECG shows a paced 61 bpm, which I personally reviewed.   Physical Exam   Vitals:   03/05/20 0545 03/05/20 0625 03/05/20 0715 03/05/20 0800  BP: 107/77  (!) 105/53 102/80  Pulse: 61 (!) 58 61 62  Resp: _1 Temp:      TempSrc:      SpO2: 94% 96% 98% 95%     Intake/Output Summary (Last 24 hours) at 03/05/2020 0850 Last data filed at  03/05/2020 0820 Gross per 24 hour  Intake --  Output 1150 ml  Net -1150 ml    Last 3 Weights 02/22/2020 02/21/2020 02/20/2020  Weight (lbs) 167 lb 167 lb 167 lb 1.7 oz  Weight (kg) 75.751 kg 75.751 kg 75.8 kg    There is no height or weight on file to calculate BMI.   General: Well nourished, well developed, in no acute distress Head: Atraumatic, normal size  Eyes: PEERLA, EOMI  Neck: Supple, JVD 15 to 17 cm of water Endocrine: No thryomegaly Cardiac: Normal S1, S2; RRR; 3 out of 6 holosystolic murmur Lungs: Rales Abd: Soft, nontender, no hepatomegaly  Ext: Trace edema Musculoskeletal: No deformities, BUE and BLE strength normal and equal Skin: Warm and dry, no rashes   Neuro: Alert and oriented to person, place, time, and situation, CNII-XII grossly intact, no focal deficits  Psych: Normal mood and affect   Labs  High Sensitivity Troponin:   Recent Labs  Lab 02/11/20 2014 02/11/20 2247  TROPONINIHS 17 18*     Cardiac EnzymesNo results for input(s): TROPONINI in the last 168 hours. No results for input(s): TROPIPOC in the last 168 hours.  Chemistry Recent Labs  Lab 03/03/20 1718  03/04/20 1010 03/05/20 0402  NA 135  --  139  K 4.8  --  4.0  CL 97*  --  100  CO2 26  --  28  GLUCOSE 200*  --  102*  BUN 58*  --  60*  CREATININE 3.42*  --  2.91*  CALCIUM 9.1  --  9.3  PROT  --  7.1  --   ALBUMIN  --  3.7  --   AST  --  16  --   ALT  --  15  --   ALKPHOS  --  109  --   BILITOT  --  1.0  --   GFRNONAA 12*  --  15*  GFRAA 14*  --  17*  ANIONGAP 12  --  11    Hematology Recent Labs  Lab 03/03/20 1718 03/05/20 0402  WBC 4.0 3.7*  RBC 3.50* 3.43*  HGB 9.4* 9.4*  HCT 31.8* 31.5*  MCV 90.9 91.8  MCH 26.9 27.4  MCHC 29.6* 29.8*  RDW 16.7* 16.9*  PLT 226 220   BNP Recent Labs  Lab 03/04/20 0940  BNP 466.5*    DDimer No results for input(s): DDIMER in the last 168 hours.   Radiology  DG Chest 2 View  Result Date: 03/03/2020 CLINICAL DATA:  Shortness of  breath and lower extremity edema. EXAM: CHEST - 2 VIEW COMPARISON:  02/19/2020 FINDINGS: Left-sided pacemaker unchanged. Lungs are somewhat hypoinflated without focal airspace consolidation or effusion. Subtle hazy prominence of the central perihilar markings likely mild vascular congestion. Stable moderate cardiomegaly. Remainder of the exam is unchanged. IMPRESSION: Cardiomegaly with suggestion of minimal vascular congestion. Electronically Signed   By: Marin Olp M.D.   On: 03/03/2020 17:31   US RENAL  Result Date: 03/04/2020 CLINICAL DATA:  Acute kidney injury.  CKD. EXAM: RENAL / URINARY TRACT ULTRASOUND COMPLETE COMPARISON:  12/22/2017 FINDINGS: Right Kidney: Renal measurements: 9.2 x 3.6 x 4.8 cm = volume: 83 mL . Echogenicity within normal limits. No mass or hydronephrosis visualized. Left Kidney: Renal measurements: 9.2 x 4.8 x 4.5 cm = volume: 105 mL. Echogenicity within normal limits. No mass or hydronephrosis visualized. Bladder: Appears normal for degree of bladder distention. Other: None. IMPRESSION: Unremarkable ultrasound of the kidneys and bladder. No evidence of obstructive uropathy. Electronically Signed   By: Davina Poke D.O.   On: 03/04/2020 16:19    Cardiac Studies  TTE 02/12/2020 1. Left ventricular ejection fraction, by estimation, is 45 to 50%. The  left ventricle has mildly decreased function. The left ventricle  demonstrates global hypokinesis. Left ventricular diastolic function could  not be evaluated. There is the  interventricular septum is flattened in systole and diastole, consistent  with right ventricular pressure and volume overload.  2. Right ventricular systolic function is severely reduced. The right  ventricular size is severely enlarged. There is moderately elevated  pulmonary artery systolic pressure. The estimated right ventricular  systolic pressure is 56.8 mmHg.  3. Right atrial size was severely dilated.  4. The mitral valve is grossly  normal. Mild mitral valve regurgitation.  No evidence of mitral stenosis.  5. The tricuspid valve is abnormal. Tricuspid valve regurgitation is  moderate to severe.  6. The aortic valve is tricuspid. Aortic valve regurgitation is not  visualized. No aortic stenosis is present.  7. The inferior vena cava is normal in size with greater than 50%  respiratory variability, suggesting right atrial pressure of 3 mmHg.   Patient Profile  Erin Wigger  [name] is a 79 y.o. 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  Assessment & Plan   1.  Volume overload/right heart failure/cor pulmonale/severe tricuspid regurgitation/systolic heart failure EF 45 to 50% -Recent admission in July.  Right heart cath confirmed severe pulmonary hypertension with normal LV filling pressures.  Felt to be secondary to restrictive lung disease. -Appears to have been started on sildenafil.  Presents a few weeks later volume overloaded.  Suspect this is from right to left shunting the overloaded the left side. -Hold BP medications -Creatinine is improving.  Continue with Lasix 80 mg IV twice daily per nephrology. -Since her numbers are heading in the right direction I doubt she will need a PICC line and coox.  We will continue with medical management for now.  2.  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  Creatinine is improving. -This does not appear to be related to left-sided heart failure. -She does have a history of systolic heart failure.  We'll repeat a limited echo today just to assess left-sided filling pressures as well as her  RV.  For questions or updates, please contact [place] Please consult [email or web address] for contact info under   Time Spent with Patient: I have spent a total of 25 minutes with patient reviewing hospital notes, telemetry, EKGs, labs and examining the patient as well as establishing an assessment and plan that was discussed with the patient.  > 50% of time was spent in direct patient care.    Signed, Addison Naegeli. Audie Box, [place]  03/05/2020 8:50 AM

## 2020-03-06 ENCOUNTER — Ambulatory Visit (INDEPENDENT_AMBULATORY_CARE_PROVIDER_SITE_OTHER): Payer: Medicare PPO | Admitting: *Deleted

## 2020-03-06 DIAGNOSIS — I428 Other cardiomyopathies: Secondary | ICD-10-CM | POA: Diagnosis not present

## 2020-03-06 LAB — BASIC METABOLIC PANEL
Anion gap: 10 (ref 5–15)
BUN: 52 mg/dL — ABNORMAL HIGH (ref 8–23)
CO2: 30 mmol/L (ref 22–32)
Calcium: 9.2 mg/dL (ref 8.9–10.3)
Chloride: 98 mmol/L (ref 98–111)
Creatinine, Ser: 2.4 mg/dL — ABNORMAL HIGH (ref 0.44–1.00)
GFR calc Af Amer: 21 mL/min — ABNORMAL LOW (ref >60)
GFR calc non Af Amer: 18 mL/min — ABNORMAL LOW (ref >60)
Glucose, Bld: 141 mg/dL — ABNORMAL HIGH (ref 70–99)
Potassium: 4 mmol/L (ref 3.5–5.1)
Sodium: 138 mmol/L (ref 135–145)

## 2020-03-06 LAB — GLUCOSE, CAPILLARY
Glucose-Capillary: 113 mg/dL — ABNORMAL HIGH (ref 70–99)
Glucose-Capillary: 173 mg/dL — ABNORMAL HIGH (ref 70–99)
Glucose-Capillary: 127 mg/dL — ABNORMAL HIGH (ref 70–99)
Glucose-Capillary: 204 mg/dL — ABNORMAL HIGH (ref 70–99)

## 2020-03-06 LAB — MAGNESIUM: Magnesium: 2.4 mg/dL (ref 1.7–2.4)

## 2020-03-06 MED ORDER — TORSEMIDE 20 MG PO TABS: 60.0000 mg | ORAL_TABLET | Freq: Every day | ORAL | Status: DC

## 2020-03-06 MED ORDER — POLYETHYLENE GLYCOL 3350 17 G PO PACK
17.0000 g | PACK | Freq: Every day | ORAL | Status: DC
  Administered 2020-03-06 – 2020-03-07 (×2): 17 g via ORAL
  Filled 2020-03-06 (×2): qty 1

## 2020-03-06 MED ORDER — CARVEDILOL 3.125 MG PO TABS
3.1250 mg | ORAL_TABLET | Freq: Two times a day (BID) | ORAL | Status: DC
  Administered 2020-03-06 – 2020-03-07 (×2): 3.125 mg via ORAL
  Filled 2020-03-06 (×3): qty 1

## 2020-03-06 MED ORDER — SENNOSIDES-DOCUSATE SODIUM 8.6-50 MG PO TABS
1.0000 | ORAL_TABLET | Freq: Every evening | ORAL | Status: DC | PRN
  Administered 2020-03-06: 1 via ORAL
  Filled 2020-03-06: qty 1

## 2020-03-06 MED ORDER — TORSEMIDE 20 MG PO TABS
60.0000 mg | ORAL_TABLET | Freq: Every day | ORAL | Status: DC
  Administered 2020-03-06 – 2020-03-07 (×2): 60 mg via ORAL
  Filled 2020-03-06 (×2): qty 3

## 2020-03-06 NOTE — Progress Notes (Signed)
SATURATION QUALIFICATIONS: (This note is used to comply with regulatory documentation for home oxygen)  Patient Saturations on Room Air at Rest = NT (pt 85% SpO2 on 2L O2)  Patient Saturations on Room Air while Ambulating = NT  Patient Saturations on 4 Liters of oxygen while Ambulating = 90%  Please briefly explain why patient needs home oxygen: To maintain oxygen saturations at 90 or above at rest and with mobility.    Wyona Almas, PT, DPT Acute Rehabilitation Services Pager [phone] Office [phone]

## 2020-03-06 NOTE — Plan of Care (Signed)
  Problem: Activity: Goal: Capacity to carry out activities will improve Outcome: Progressing   Problem: Coping: Goal: Ability to adjust to condition or change in health will improve Outcome: Progressing   Problem: Activity: Goal: Capacity to carry out activities will improve Outcome: Progressing   Problem: Coping: Goal: Ability to adjust to condition or change in health will improve Outcome: Progressing

## 2020-03-06 NOTE — Evaluation (Signed)
Physical Therapy Evaluation Patient Details Name: [name] MRN: [id number] DOB: 1941-02-21 Today's Date: 03/06/2020   History of Present Illness  Pt is a 79 y.o. 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   Clinical Impression  Pt admitted from SNF but pt and pt daughter requesting to return home upon discharge with HHPT/OT. On PT evaluation, pt able to participate in seated warm up exercise and ambulate 200 feet with a walker at a supervision level. SpO2 85% on 2L O2; bumped up to 4L O2 where she maintained 90-92%. HR stable. Education provided regarding endurance conservation strategies and oxygen management. See below for recommendations.     Follow Up Recommendations Home health PT;Supervision for mobility/OOB    Equipment Recommendations  3in1 (PT)    Recommendations for Other Services       Precautions / Restrictions Precautions Precautions: Other (comment) Precaution Comments: watch O2 Restrictions Weight Bearing Restrictions: No      Mobility  Bed Mobility               General bed mobility comments: OOB in chair upon entry  Transfers Overall transfer level: Modified independent Equipment used: Rolling walker (2 wheeled)                Ambulation/Gait Ambulation/Gait assistance: Supervision Gait Distance (Feet): 200 Feet Assistive device: Rolling walker (2 wheeled) Gait Pattern/deviations: Step-through pattern;Decreased stride length;Trunk flexed Gait velocity: decreased   General Gait Details: Cues for walker proximity, able to self cue for one standing rest break  Stairs            Wheelchair Mobility    Modified Rankin (Stroke Patients Only)       Balance Overall balance assessment: Needs assistance Sitting-balance support: Feet supported Sitting  balance-Leahy Scale: Good     Standing balance support: No upper extremity supported;During functional activity Standing balance-Leahy Scale: Fair                               Pertinent Vitals/Pain Pain Assessment: No/denies pain    Home Living Family/patient expects to be discharged to:: Private residence Living Arrangements: Alone Available Help at Discharge: Family;Available PRN/intermittently Type of Home: Apartment Home Access: Level entry     Home Layout: One level Home Equipment: Walker - 4 wheels;Cane - single point;Shower seat;Bedside commode;Wheelchair - manual      Prior Function Level of Independence: Independent with assistive device(s)         Comments: Before previous admission, pt independent with use of Rollator vs SPC in community     Hand Dominance   Dominant Hand: Right    Extremity/Trunk Assessment   Upper Extremity Assessment Upper Extremity Assessment: Overall WFL for tasks assessed    Lower Extremity Assessment Lower Extremity Assessment: Overall WFL for tasks assessed    Cervical / Trunk Assessment Cervical / Trunk Assessment: Normal  Communication   Communication: No difficulties  Cognition Arousal/Alertness: Awake/alert Behavior During Therapy: WFL for tasks assessed/performed Overall Cognitive Status: Within Functional Limits for tasks assessed                                        General Comments      Exercises General Exercises -  Lower Extremity Long Arc Quad: Both;10 reps;Seated Hip Flexion/Marching: Both;10 reps;Seated   Assessment/Plan    PT Assessment Patient needs continued PT services  PT Problem List Decreased activity tolerance;Decreased balance;Decreased mobility;Decreased knowledge of use of DME;Cardiopulmonary status limiting activity;Decreased knowledge of precautions       PT Treatment Interventions DME instruction;Gait training;Functional mobility training;Therapeutic  activities;Therapeutic exercise;Balance training;Patient/family education    PT Goals (Current goals can be found in the Care Plan section)  Acute Rehab PT Goals Patient Stated Goal: to return home PT Goal Formulation: With patient/family Time For Goal Achievement: 03/20/20 Potential to Achieve Goals: Good    Frequency Min 3X/week   Barriers to discharge        Co-evaluation               AM-PAC PT "6 Clicks" Mobility  Outcome Measure Help needed turning from your back to your side while in a flat bed without using bedrails?: None Help needed moving from lying on your back to sitting on the side of a flat bed without using bedrails?: None Help needed moving to and from a bed to a chair (including a wheelchair)?: None Help needed standing up from a chair using your arms (e.g., wheelchair or bedside chair)?: None Help needed to walk in hospital room?: None Help needed climbing 3-5 steps with a railing? : A Little 6 Click Score: 23    End of Session Equipment Utilized During Treatment: Oxygen Activity Tolerance: Patient tolerated treatment well Patient left: in bed;with call bell/phone within reach;with family/visitor present Nurse Communication: Mobility status PT Visit Diagnosis: Muscle weakness (generalized) (M62.81)    Time: 3496-1164 PT Time Calculation (min) (ACUTE ONLY): 28 min   Charges:   PT Evaluation $PT Eval Moderate Complexity: 1 Mod PT Treatments $Gait Training: 8-22 mins          Wyona Almas, PT, DPT Acute Rehabilitation Services Pager [phone] Office [phone]   Deno Etienne 03/06/2020, 10:55 AM

## 2020-03-06 NOTE — Progress Notes (Addendum)
PROGRESS NOTE  [name] XKG:[id number] DOB: [birth year] DOA: 03/03/2020 PCP: Girtha Rm, NP-C   LOS: 2 days   Brief narrative: As per HPI,  79 y.o.femalewith past medical history significant ofhypertension, COPD, systolic congestive heart failure  followed by Dr.Croitoru, PAH, nonobstructive CAD, DM type II controlled, CKD stage IV followed by Dr.Bhandari, and OSA on CPAP presented to the hospital with progressively worsening shortness of breath for 4 days. Just recently hospitalized 7/25-8/5 for acute on chronic systolic congestive heart failure with EF found to be 45 to 50% with global hypokinesis and elevated pulmonary artery pressures. Prior to that hospitalization, patient only used oxygen at night with CPAP. She reported progressively worsening swelling of her lower extremities, weight gain, and had been wearing 2 L nasal cannula oxygen continuously. Upon admission into the emergency department, patient was seen to be afebrile with systolic blood pressuresinitially as low as 86/50, and O2 saturations maintained on4-6 L of nasal. Labs were significant for hemoglobin 9.4, BUN 58, creatinine 3.42, glucose 200, and BNP 466.5. Chest x-ray significant for cardiomegaly with mild vascular congestion. Patient was ordered 60 mg of Lasix IV.  Patient was then admitted hospital for further evaluation and treatment.  Assessment/Plan:  Principal Problem:   Acute on chronic systolic CHF (congestive heart failure) (HCC) Active Problems:   OSA on CPAP   ICD (implantable cardioverter-defibrillator) in place   PAH (pulmonary artery hypertension) ([place])   Hypercholesterolemia   Acute on chronic respiratory failure with hypoxia (HCC)   Diabetes mellitus type 2, diet-controlled (HCC)   Transient hypotension   Acute renal failure superimposed on stage 4 chronic kidney disease ([place])  Acute on chronic respiratory failure with hypoxia secondary to systolic congestive heart failure: On 2 L  of oxygen by nasal cannula at home. Last EF noted to be 45-50% with global hypokinesis and signs of cor pulmonale on 7/26.  Cardiology on board and patient was on 80 mg of Lasix IV Lasix twice a day.  This has been changed to torsemide 60 mg oral daily starting today.  Follow daily BMP.  Strict intake and output charting, daily weights.  Transient hypotension: Home medications included amlodipine 10 mg daily, Coreg 25 mg twice daily, andtorsemide 60 mg daily.  Currently on Coreg 3.125 twice a day, aspirin, Lipitor.  Monitor blood pressure closely.  Acute kidney injury on chronic kidney disease stage IV: Patient presented with creatinine elevated up to 3.42 with BUN 58. Baseline creatinine previously noted to be around 2. Possibility of cardiorenal syndrome. Creatinine of 2.4 today.  Patient is followed by [facility] in the outpatient setting.  Creatinine improving with diuresis.  Will follow nephrology recommendation.  Renal ultrasound without obstruction.  Urinalysis was bland.  Lab Results  Component Value Date   CREATININE 2.40 (H) 03/06/2020   CREATININE 2.91 (H) 03/05/2020   CREATININE 3.42 (H) 03/03/2020    Restrictive lung disease/chronic respiratory failure prior PFTs had shown significant restrictive lung disease with marked reduced DLCO.Continue steroid nebulizers.  Complete heart blocks/p ICD .  No acute issues.  Pulmonary artery hypertension: Thought secondary to chronic restrictive lung disease and obstructive sleep apnea. Right heart catheterization from 7/29 showed severe pulmonary artery hypertension.  Sildenafil on hold.  2D echocardiogram limited repeated on 03/05/2020 showed LV ejection fraction of 45 to 50% with evidence of volume overload and moderately reduced right ventricular systolic function.  Diabetes mellitus type 2:  last hemoglobin A1c 6.5on 7/29.  Continue sliding scale insulin, Accu-Cheks, diabetic  diet.  Latest POC  glucose of 173  Anemia of chronic disease: Latest hemoglobin at 9.4.  No evidence of bleeding.  Hyperlipidemia -Continue atorvastatin   Debility: Patient has been in rehab since her last hospitalization.  Patient's family wish home health on discharge at this time  Obstructive sleep apnea on CPAP -Continue CPAP nightly.  Encouraged to use at night  DVT prophylaxis: heparin injection 5,000 Units Start: 03/04/20 1400  Code Status: Full code  Family Communication: Spoke with the patient's daughter at bedside.  Status is: Inpatient  Remains inpatient appropriate because:IV treatments appropriate due to intensity of illness or inability to take PO and Inpatient level of care appropriate due to severity of illness   Dispo: The patient is from: [facility]              Anticipated d/c is to: Home with home health.  Spoke with the patient's daughter who wishes to go home with home health services.  Inquiring about new CPAP machine and nebulizer on discharge.              Anticipated d/c date is: 1-2 days              Patient currently is not medically stable to d/c.  Consultants:  Cardiology  Nephrology  Procedures:  None  Antibiotics:  . None  Anti-infectives (From admission, onward)   None     Subjective: Today, patient was seen and examined at bedside.  Complains of constipation.  Denies chest pain, palpitation, dizziness.  Patient has mild dyspnea but has been on diuresis.  Objective: Vitals:   03/06/20 0803 03/06/20 0849  BP:  (!) 98/54  Pulse:  (!) 59  Resp:  18  Temp:  97.8 F (36.6 C)  SpO2: 96% 96%    Intake/Output Summary (Last 24 hours) at 03/06/2020 0922 Last data filed at 03/06/2020 0435 Gross per 24 hour  Intake 660 ml  Output 1800 ml  Net -1140 ml   Filed Weights   03/05/20 0915 03/06/20 0435  Weight: 77.8 kg 77.4 kg   Body mass index is 31.22 kg/m.   Physical Exam: GENERAL: Patient is alert awake and oriented. Not in obvious  distress.  Obese, on nasal cannula oxygen at 2 L/min. HENT: No scleral pallor or icterus. Pupils equally reactive to light. Oral mucosa is moist NECK: is supple, no gross swelling noted. CHEST: Diminished breath sounds bilaterally.  Left chest wall AICD in place.  Coarse breath sounds noted. CVS: S1 and S2 heard, no murmur. Regular rate and rhythm.  ABDOMEN: Soft, non-tender, bowel sounds are present. EXTREMITIES: Trace edema noted CNS: Cranial nerves are intact. No focal motor deficits. SKIN: warm and dry without rashes.  Data Review: I have personally reviewed the following laboratory data and studies,  CBC: Recent Labs  Lab 03/03/20 1718 03/05/20 0402  WBC 4.0 3.7*  NEUTROABS  --  2.2  HGB 9.4* 9.4*  HCT 31.8* 31.5*  MCV 90.9 91.8  PLT 226 500   Basic Metabolic Panel: Recent Labs  Lab 03/03/20 1718 03/04/20 1010 03/05/20 0402  NA 135  --  139  K 4.8  --  4.0  CL 97*  --  100  CO2 26  --  28  GLUCOSE 200*  --  102*  BUN 58*  --  60*  CREATININE 3.42*  --  2.91*  CALCIUM 9.1  --  9.3  MG  --  2.5* 2.6*  PHOS  --  5.8*  --  Liver Function Tests: Recent Labs  Lab 03/04/20 1010  AST 16  ALT 15  ALKPHOS 109  BILITOT 1.0  PROT 7.1  ALBUMIN 3.7   No results for input(s): LIPASE, AMYLASE in the last 168 hours. No results for input(s): AMMONIA in the last 168 hours. Cardiac Enzymes: No results for input(s): CKTOTAL, CKMB, CKMBINDEX, TROPONINI in the last 168 hours. BNP (last 3 results) Recent Labs    02/11/20 2014 02/21/20 0633 03/04/20 0940  BNP 617.2* 364.7* 466.5*    ProBNP (last 3 results) No results for input(s): PROBNP in the last 8760 hours.  CBG: Recent Labs  Lab 03/05/20 0817 03/05/20 1114 03/05/20 1612 03/05/20 2102 03/06/20 0644  GLUCAP 95 140* 155* 134* 113*   Recent Results (from the past 240 hour(s))  SARS Coronavirus 2 by RT PCR (hospital order, performed in [facility] hospital lab) Nasopharyngeal Nasopharyngeal Swab      Status: None   Collection Time: 03/04/20  8:38 AM   Specimen: Nasopharyngeal Swab  Result Value Ref Range Status   SARS Coronavirus 2 NEGATIVE NEGATIVE Final    Comment: (NOTE) SARS-CoV-2 target nucleic acids are NOT DETECTED.  The SARS-CoV-2 RNA is generally detectable in upper and lower respiratory specimens during the acute phase of infection. The lowest concentration of SARS-CoV-2 viral copies this assay can detect is 250 copies / mL. A negative result does not preclude SARS-CoV-2 infection and should not be used as the sole basis for treatment or other patient management decisions.  A negative result may occur with improper specimen collection / handling, submission of specimen other than nasopharyngeal swab, presence of viral mutation(s) within the areas targeted by this assay, and inadequate number of viral copies (<250 copies / mL). A negative result must be combined with clinical observations, patient history, and epidemiological information.  Fact Sheet for Patients:   StrictlyIdeas.no  Fact Sheet for Healthcare Providers: BankingDealers.co.za  This test is not yet approved or  cleared by the [place] FDA and has been authorized for detection and/or diagnosis of SARS-CoV-2 by FDA under an Emergency Use Authorization (EUA).  This EUA will remain in effect (meaning this test can be used) for the duration of the COVID-19 declaration under Section 564(b)(1) of the Act, 21 U.S.C. section 360bbb-3(b)(1), unless the authorization is terminated or revoked sooner.  Performed at [facility], [place] [address] [place], [place] [address]      Studies: US RENAL  Result Date: 03/04/2020 CLINICAL DATA:  Acute kidney injury.  CKD. EXAM: RENAL / URINARY TRACT ULTRASOUND COMPLETE COMPARISON:  12/22/2017 FINDINGS: Right Kidney: Renal measurements: 9.2 x 3.6 x 4.8 cm = volume: 83 mL . Echogenicity within normal limits. No mass or  hydronephrosis visualized. Left Kidney: Renal measurements: 9.2 x 4.8 x 4.5 cm = volume: 105 mL. Echogenicity within normal limits. No mass or hydronephrosis visualized. Bladder: Appears normal for degree of bladder distention. Other: None. IMPRESSION: Unremarkable ultrasound of the kidneys and bladder. No evidence of obstructive uropathy. Electronically Signed   By: Davina Poke D.O.   On: 03/04/2020 16:19   ECHOCARDIOGRAM LIMITED  Result Date: 03/05/2020    ECHOCARDIOGRAM LIMITED REPORT   Patient Name:   [name] Date of Exam: 03/05/2020 Medical Rec #:  [id number]       Height:       62.0 in Accession #:    [id number]      Weight:       171.5 lb Date of Birth:  May 04, 1941  BSA:          1.791 m Patient Age:    51 years        BP:           109/60 mmHg Patient Gender: F               HR:           61 bpm. Exam Location:  Inpatient Procedure: Limited Echo, Intracardiac Opacification Agent, Limited Color Doppler            and Cardiac Doppler Indications:    CHF-Acute Systolic 149.70 / Y63.78  History:        Patient has prior history of Echocardiogram examinations, most                 recent 02/12/2020. Defibrillator, Pulmonary HTN and COPD,                 Signs/Symptoms:Hypotension; Risk Factors:Former Smoker,                 Diabetes, Hypertension and Sleep Apnea. Acute on chronic                 respiratory failure secondary to systolic CHF, Cor Pulmonale,                 Chronic kidney disease, secondary anemia.  Sonographer:    Darlina Sicilian RDCS Referring Phys: [id number] [place]  1. Left ventricular ejection fraction, by estimation, is 45 to 50%. The left ventricle has mildly decreased function. The left ventricle demonstrates regional wall motion abnormalities. The left ventricular internal cavity size was mildly dilated. There  is the interventricular septum is flattened in systole and diastole, consistent with right ventricular pressure and volume overload.  2.  Right ventricular systolic function is moderately reduced. The right ventricular size is moderately enlarged. There is moderately elevated pulmonary artery systolic pressure. The estimated right ventricular systolic pressure is 74.1 mmHg.  3. Right atrial size was severely dilated.  4. The mitral valve is normal in structure. Trivial mitral valve regurgitation.  5. The tricuspid valve is abnormal. 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  6. The aortic valve is tricuspid. Aortic valve regurgitation is not visualized. No aortic stenosis is present.  7. The inferior vena cava is dilated in size with <50% respiratory variability, suggesting right atrial pressure of 15 mmHg. FINDINGS  Left Ventricle: Left ventricular ejection fraction, by estimation, is 45 to 50%. The left ventricle has mildly decreased function. The left ventricle demonstrates regional wall motion abnormalities. Definity contrast agent was given IV to delineate the left ventricular endocardial borders. The left ventricular internal cavity size was mildly dilated. There is no left ventricular hypertrophy. The interventricular septum is flattened in systole and diastole, consistent with right ventricular pressure and  volume overload. Right Ventricle: The right ventricular size is moderately enlarged. Right ventricular systolic function is moderately reduced. There is moderately elevated pulmonary artery systolic pressure. The tricuspid regurgitant velocity is 2.84 m/s, and with an assumed right atrial pressure of 15 mmHg, the estimated right ventricular systolic pressure is 28.7 mmHg. Right Atrium: Right atrial size was severely dilated. Mitral Valve: The mitral valve is normal in structure. Trivial mitral valve regurgitation. Tricuspid Valve: The tricuspid valve is abnormal. 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 Aortic Valve: The aortic valve is tricuspid. Aortic valve regurgitation is not visualized. No aortic stenosis is  present. Venous: The inferior vena cava is dilated in  size with less than 50% respiratory variability, suggesting right atrial pressure of 15 mmHg. LEFT VENTRICLE PLAX 2D LVIDd:         5.55 cm      Diastology LVIDs:         4.40 cm      LV e' lateral:   7.56 cm/s LV PW:         1.00 cm      LV E/e' lateral: 8.7 LV IVS:        0.85 cm      LV e' medial:    4.05 cm/s                             LV E/e' medial:  16.2  LV Volumes (MOD) LV vol d, MOD A2C: 163.0 ml LV vol d, MOD A4C: 139.0 ml LV vol s, MOD A2C: 90.1 ml LV vol s, MOD A4C: 65.4 ml LV SV MOD A2C:     72.9 ml LV SV MOD A4C:     139.0 ml LV SV MOD BP:      73.8 ml LEFT ATRIUM         Index      RIGHT ATRIUM           Index LA diam:    4.60 cm 2.57 cm/m RA Area:     28.60 cm                                RA Volume:   106.00 ml 59.19 ml/m  AORTIC VALVE LVOT Vmax:   82.20 cm/s LVOT Vmean:  53.800 cm/s LVOT VTI:    0.190 m MITRAL VALVE               TRICUSPID VALVE MV Area (PHT): 2.95 cm    TR Peak grad:   32.3 mmHg MV Decel Time: 257 msec    TR Vmax:        284.00 cm/s MV E velocity: 65.60 cm/s MV A velocity: 69.00 cm/s  SHUNTS MV E/A ratio:  0.95        Systemic VTI: 0.19 m Oswaldo Milian MD Electronically signed by Oswaldo Milian MD Signature Date/Time: 03/05/2020/2:36:28 PM    Final       Flora Lipps, MD  [facility] 03/06/2020

## 2020-03-06 NOTE — Progress Notes (Signed)
Progress Note  Patient Name: [name] Date of Encounter: 03/06/2020  Primary Cardiologist: Sanda Klein, MD  Subjective   Doing very well this AM. Up to chair. Minimal SOB with ambulation. No chest pain  Inpatient Medications    Scheduled Meds: . aspirin EC  81 mg Oral QHS  . atorvastatin  40 mg Oral Daily  . budesonide  0.5 mg Nebulization BID  . carvedilol  3.125 mg Oral BID WC  . furosemide  80 mg Intravenous BID  . heparin  5,000 Units Subcutaneous Q8H  . insulin aspart  0-6 Units Subcutaneous TID WC  . sodium chloride flush  3 mL Intravenous Q12H   Continuous Infusions: . sodium chloride     PRN Meds: sodium chloride, acetaminophen, ipratropium-albuterol, ondansetron (ZOFRAN) IV, sodium chloride flush   Vital Signs    Vitals:   03/05/20 2104 03/06/20 0227 03/06/20 0435 03/06/20 0803  BP: (!) 106/44 119/65 112/61   Pulse: (!) 58 61 60   Resp: _0 Temp: 97.7 F (36.5 C) (!) 97.5 F (36.4 C) (!) 97.5 F (36.4 C)   TempSrc: Oral Oral Oral   SpO2: 97% 100% 97% 96%  Weight:   77.4 kg   Height:        Intake/Output Summary (Last 24 hours) at 03/06/2020 0810 Last data filed at 03/06/2020 0435 Gross per 24 hour  Intake 420 ml  Output 2600 ml  Net -2180 ml   Filed Weights   03/05/20 0915 03/06/20 0435  Weight: 77.8 kg 77.4 kg    Physical Exam   General: Elderly, NAD Neck: Negative for carotid bruits. No JVD Lungs: Bilateral rhonchi. Breathing is unlabored. Cardiovascular: RRR with S1 S2. No murmurs Abdomen: Soft, non-tender, non-distended. No obvious abdominal masses. Extremities: No edema. Radial pulses 2+ bilaterally Neuro: Alert and oriented. No focal deficits. No facial asymmetry. MAE spontaneously. Psych: Responds to questions appropriately with normal affect.    Labs    Chemistry Recent Labs  Lab 03/03/20 1718 03/04/20 1010 03/05/20 0402  NA 135  --  139  K 4.8  --  4.0  CL 97*  --  100  CO2 26  --  28  GLUCOSE  200*  --  102*  BUN 58*  --  60*  CREATININE 3.42*  --  2.91*  CALCIUM 9.1  --  9.3  PROT  --  7.1  --   ALBUMIN  --  3.7  --   AST  --  16  --   ALT  --  15  --   ALKPHOS  --  109  --   BILITOT  --  1.0  --   GFRNONAA 12*  --  15*  GFRAA 14*  --  17*  ANIONGAP 12  --  11     Hematology Recent Labs  Lab 03/03/20 1718 03/05/20 0402  WBC 4.0 3.7*  RBC 3.50* 3.43*  HGB 9.4* 9.4*  HCT 31.8* 31.5*  MCV 90.9 91.8  MCH 26.9 27.4  MCHC 29.6* 29.8*  RDW 16.7* 16.9*  PLT 226 220    Cardiac EnzymesNo results for input(s): TROPONINI in the last 168 hours. No results for input(s): TROPIPOC in the last 168 hours.   BNP Recent Labs  Lab 03/04/20 0940  BNP 466.5*     DDimer No results for input(s): DDIMER in the last 168 hours.   Radiology    US RENAL  Result Date: 03/04/2020 CLINICAL DATA:  Acute  kidney injury.  CKD. EXAM: RENAL / URINARY TRACT ULTRASOUND COMPLETE COMPARISON:  12/22/2017 FINDINGS: Right Kidney: Renal measurements: 9.2 x 3.6 x 4.8 cm = volume: 83 mL . Echogenicity within normal limits. No mass or hydronephrosis visualized. Left Kidney: Renal measurements: 9.2 x 4.8 x 4.5 cm = volume: 105 mL. Echogenicity within normal limits. No mass or hydronephrosis visualized. Bladder: Appears normal for degree of bladder distention. Other: None. IMPRESSION: Unremarkable ultrasound of the kidneys and bladder. No evidence of obstructive uropathy. Electronically Signed   By: Davina Poke D.O.   On: 03/04/2020 16:19   ECHOCARDIOGRAM LIMITED  Result Date: 03/05/2020    ECHOCARDIOGRAM LIMITED REPORT   Patient Name:   [name] Date of Exam: 03/05/2020 Medical Rec #:  [id number]       Height:       62.0 in Accession #:    [id number]      Weight:       171.5 lb Date of Birth:  1941-03-15        BSA:          1.791 m Patient Age:    79 years        BP:           109/60 mmHg Patient Gender: F               HR:           61 bpm. Exam Location:  Inpatient Procedure: Limited Echo,  Intracardiac Opacification Agent, Limited Color Doppler            and Cardiac Doppler Indications:    CHF-Acute Systolic 196.22 / W97.98  History:        Patient has prior history of Echocardiogram examinations, most                 recent 02/12/2020. Defibrillator, Pulmonary HTN and COPD,                 Signs/Symptoms:Hypotension; Risk Factors:Former Smoker,                 Diabetes, Hypertension and Sleep Apnea. Acute on chronic                 respiratory failure secondary to systolic CHF, Cor Pulmonale,                 Chronic kidney disease, secondary anemia.  Sonographer:    Darlina Sicilian RDCS Referring Phys: [id number] [place]  1. Left ventricular ejection fraction, by estimation, is 45 to 50%. The left ventricle has mildly decreased function. The left ventricle demonstrates regional wall motion abnormalities. The left ventricular internal cavity size was mildly dilated. There  is the interventricular septum is flattened in systole and diastole, consistent with right ventricular pressure and volume overload.  2. Right ventricular systolic function is moderately reduced. The right ventricular size is moderately enlarged. There is moderately elevated pulmonary artery systolic pressure. The estimated right ventricular systolic pressure is 74.0 mmHg.  3. Right atrial size was severely dilated.  4. The mitral valve is normal in structure. Trivial mitral valve regurgitation.  5. The tricuspid valve is abnormal. 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  6. The aortic valve is tricuspid. Aortic valve regurgitation is not visualized. No aortic stenosis is present.  7. The inferior vena cava is dilated in size with <50% respiratory variability, suggesting right atrial pressure of 15 mmHg. FINDINGS  Left Ventricle: Left ventricular ejection fraction, by  estimation, is 45 to 50%. The left ventricle has mildly decreased function. The left ventricle demonstrates regional wall motion  abnormalities. Definity contrast agent was given IV to delineate the left ventricular endocardial borders. The left ventricular internal cavity size was mildly dilated. There is no left ventricular hypertrophy. The interventricular septum is flattened in systole and diastole, consistent with right ventricular pressure and  volume overload. Right Ventricle: The right ventricular size is moderately enlarged. Right ventricular systolic function is moderately reduced. There is moderately elevated pulmonary artery systolic pressure. The tricuspid regurgitant velocity is 2.84 m/s, and with an assumed right atrial pressure of 15 mmHg, the estimated right ventricular systolic pressure is 34.2 mmHg. Right Atrium: Right atrial size was severely dilated. Mitral Valve: The mitral valve is normal in structure. Trivial mitral valve regurgitation. Tricuspid Valve: The tricuspid valve is abnormal. 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 Aortic Valve: The aortic valve is tricuspid. Aortic valve regurgitation is not visualized. No aortic stenosis is present. Venous: The inferior vena cava is dilated in size with less than 50% respiratory variability, suggesting right atrial pressure of 15 mmHg. LEFT VENTRICLE PLAX 2D LVIDd:         5.55 cm      Diastology LVIDs:         4.40 cm      LV e' lateral:   7.56 cm/s LV PW:         1.00 cm      LV E/e' lateral: 8.7 LV IVS:        0.85 cm      LV e' medial:    4.05 cm/s                             LV E/e' medial:  16.2  LV Volumes (MOD) LV vol d, MOD A2C: 163.0 ml LV vol d, MOD A4C: 139.0 ml LV vol s, MOD A2C: 90.1 ml LV vol s, MOD A4C: 65.4 ml LV SV MOD A2C:     72.9 ml LV SV MOD A4C:     139.0 ml LV SV MOD BP:      73.8 ml LEFT ATRIUM         Index      RIGHT ATRIUM           Index LA diam:    4.60 cm 2.57 cm/m RA Area:     28.60 cm                                RA Volume:   106.00 ml 59.19 ml/m  AORTIC VALVE LVOT Vmax:   82.20 cm/s LVOT Vmean:  53.800 cm/s LVOT VTI:     0.190 m MITRAL VALVE               TRICUSPID VALVE MV Area (PHT): 2.95 cm    TR Peak grad:   32.3 mmHg MV Decel Time: 257 msec    TR Vmax:        284.00 cm/s MV E velocity: 65.60 cm/s MV A velocity: 69.00 cm/s  SHUNTS MV E/A ratio:  0.95        Systemic VTI: 0.19 m Oswaldo Milian MD Electronically signed by Oswaldo Milian MD Signature Date/Time: 03/05/2020/2:36:28 PM    Final    Telemetry    03/06/20 NSR - Personally Reviewed  ECG    No new  tracing as of 03/06/20- Personally Reviewed  Cardiac Studies   TTE 02/12/2020 1. Left ventricular ejection fraction, by estimation, is 45 to 50%. The  left ventricle has mildly decreased function. The left ventricle  demonstrates global hypokinesis. Left ventricular diastolic function could  not be evaluated. There is the  interventricular septum is flattened in systole and diastole, consistent  with right ventricular pressure and volume overload.  2. Right ventricular systolic function is severely reduced. The right  ventricular size is severely enlarged. There is moderately elevated  pulmonary artery systolic pressure. The estimated right ventricular  systolic pressure is 67.5 mmHg.  3. Right atrial size was severely dilated.  4. The mitral valve is grossly normal. Mild mitral valve regurgitation.  No evidence of mitral stenosis.  5. The tricuspid valve is abnormal. Tricuspid valve regurgitation is  moderate to severe.  6. The aortic valve is tricuspid. Aortic valve regurgitation is not  visualized. No aortic stenosis is present.  7. The inferior vena cava is normal in size with greater than 50%  respiratory variability, suggesting right atrial pressure of 3 mmHg.   Patient Profile     79 y.o. 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  Assessment & Plan    1. Right heart failure/cor pulmonale with severe TR and systolic HF with EF at 91-63%: -RHC on prior hospital admission 01/2020 with severe pulmonary HTN with normal filling pressures felt to be secondary to restrictive lung disease>>started on Sildenafil however re-presented to [facility] with fluid volume overload  -Plan to continue with IV Lasix 8m BID per nephrology  -Creatinine improved on yesterday labs at 2.91, pending BMET this AM   2. 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 Right ventricular systolic pressure is 484.6mmHg.   Signed, JKathyrn DrownNP-C [facility] Pager: [phone] 92098/18/2021, 8:10 AM     For questions or updates, please contact   Please consult [email or web address] for contact info under Cardiology/STEMI.

## 2020-03-06 NOTE — Plan of Care (Signed)
  Problem: Education: Goal: Ability to demonstrate management of disease process will improve Outcome: Progressing Goal: Ability to verbalize understanding of medication therapies will improve Outcome: Progressing Goal: Individualized Educational Video(s) Outcome: Progressing   Problem: Activity: Goal: Capacity to carry out activities will improve Outcome: Progressing   Problem: Cardiac: Goal: Ability to achieve and maintain adequate cardiopulmonary perfusion will improve Outcome: Progressing   Problem: Education: Goal: Ability to describe self-care measures that may prevent or decrease complications (Diabetes Survival Skills Education) will improve Outcome: Progressing Goal: Individualized Educational Video(s) Outcome: Progressing   Problem: Coping: Goal: Ability to adjust to condition or change in health will improve Outcome: Progressing   Problem: Fluid Volume: Goal: Ability to maintain a balanced intake and output will improve Outcome: Progressing   Problem: Health Behavior/Discharge Planning: Goal: Ability to identify and utilize available resources and services will improve Outcome: Progressing Goal: Ability to manage health-related needs will improve Outcome: Progressing   Problem: Metabolic: Goal: Ability to maintain appropriate glucose levels will improve Outcome: Progressing   Problem: Nutritional: Goal: Maintenance of adequate nutrition will improve Outcome: Progressing Goal: Progress toward achieving an optimal weight will improve Outcome: Progressing   Problem: Skin Integrity: Goal: Risk for impaired skin integrity will decrease Outcome: Progressing   Problem: Tissue Perfusion: Goal: Adequacy of tissue perfusion will improve Outcome: Progressing

## 2020-03-06 NOTE — Progress Notes (Signed)
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 Follows with Dr. [name]. Admit Cr 3.4. -AKI likely secondary to cardiorenal syndrome and hypotension leading to decreased EABV/renal perfusion. Cr 3.4>2.9 yesterday -transition diuretics to oral -renal u/s without obstruction -UA bland -hopeful for further recovery will see where her kidney function plateaus -Continue to monitor daily Cr, Dose meds for GFR<15 -Monitor Daily I/Os, Daily weight  -Maintain MAP>65 for optimal renal perfusion.   Acute on chronic respiratory failure secondary to systolic CHF -given significant improvement in volume status with lasix 9m iv bid, will transition to torsemide 694mdaily today to start with (can go up to 8066maily if needed) -appreciate assistance from cardiology.  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 Repeat echo pending  Hypotension, likely med effect from sildenafil. H/o HTN -coreg restarted per cardiology.   Chronic respiratory insufficiency with restrictive lung disease and OSA: Nebs as needed.  CPAP nightly  Anemia of chronic disease -Transfuse for Hgb<7 g/dL.  Hemoglobin currently stable, monitor for now. If worsening, check iron panel  Diabetes Mellitus type 2: mgmt per primary service  Recommendations conveyed to primary service.   VikGean QuintD [facility] 03/05/2020, 9:54 AM    Subjective:   No acute events.  Feels significantly better.  Still has some orthopnea but dyspnea on exertion is much better today.   Objective:   BP (!) 98/54 (BP Location: Right Arm)   Pulse (!) 59   Temp 97.8 F (36.6 C) (Oral)   Resp 18   Ht _0  (1.575 m)   Wt 77.4 kg   SpO2 96%   BMI 31.22 kg/m   Intake/Output Summary (Last  24 hours) at 03/06/2020 0920 Last data filed at 03/06/2020 0435 Gross per 24 hour  Intake 660 ml  Output 1800 ml  Net -1140 ml   Weight change:   Physical Exam: Gen:NAD, sitting up in chair CVS:s1s2, +murmur, no r/g Resp:diminished air entry bibasilar with fine crackles, unlabored, bl chest expansion, on Stanley AbdDBZ:MCEYt/nd Ext: very trace dependent edema bl le's (improved) Skin: no rash Neuro: awake, alert, oriented, speech clear and coherent, moves all ext spontaneously  Imaging: [place]  Result Date: 03/04/2020 CLINICAL DATA:  Acute kidney injury.  CKD. EXAM: RENAL / URINARY TRACT ULTRASOUND COMPLETE COMPARISON:  12/22/2017 FINDINGS: Right Kidney: Renal measurements: 9.2 x 3.6 x 4.8 cm = volume: 83 mL . Echogenicity within normal limits. No mass or hydronephrosis visualized. Left Kidney: Renal measurements: 9.2 x 4.8 x 4.5 cm = volume: 105 mL. Echogenicity within normal limits. No mass or hydronephrosis visualized. Bladder: Appears normal for degree of bladder distention. Other: None. IMPRESSION: Unremarkable ultrasound of the kidneys and bladder. No evidence of obstructive uropathy. Electronically Signed   By: NicDavina PokeO.   On: 03/04/2020 16:19   ECHOCARDIOGRAM LIMITED  Result Date: 03/05/2020    ECHOCARDIOGRAM LIMITED REPORT   Patient Name:   AMESEYLAH WERNERTte of Exam: 03/05/2020 Medical Rec #:  [id number]    Height:       62.0 in Accession #:    [id number]   Weight:       171.5 lb Date of Birth:  [birth year]     BSA:          1.791 m  Patient Age:    79 years        BP:           109/60 mmHg Patient Gender: F               HR:           61 bpm. Exam Location:  Inpatient Procedure: Limited Echo, Intracardiac Opacification Agent, Limited Color Doppler            and Cardiac Doppler Indications:    CHF-Acute Systolic 932.35 / T73.22  History:        Patient has prior history of Echocardiogram examinations, most                 recent 02/12/2020. Defibrillator, Pulmonary HTN  and COPD,                 Signs/Symptoms:Hypotension; Risk Factors:Former Smoker,                 Diabetes, Hypertension and Sleep Apnea. Acute on chronic                 respiratory failure secondary to systolic CHF, Cor Pulmonale,                 Chronic kidney disease, secondary anemia.  Sonographer:    Darlina Sicilian RDCS Referring Phys: [id number] [place]  1. Left ventricular ejection fraction, by estimation, is 45 to 50%. The left ventricle has mildly decreased function. The left ventricle demonstrates regional wall motion abnormalities. The left ventricular internal cavity size was mildly dilated. There  is the interventricular septum is flattened in systole and diastole, consistent with right ventricular pressure and volume overload.  2. Right ventricular systolic function is moderately reduced. The right ventricular size is moderately enlarged. There is moderately elevated pulmonary artery systolic pressure. The estimated right ventricular systolic pressure is 62.3 mmHg.  3. Right atrial size was severely dilated.  4. The mitral valve is normal in structure. Trivial mitral valve regurgitation.  5. The tricuspid valve is abnormal. 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  6. The aortic valve is tricuspid. Aortic valve regurgitation is not visualized. No aortic stenosis is present.  7. The inferior vena cava is dilated in size with <50% respiratory variability, suggesting right atrial pressure of 15 mmHg. FINDINGS  Left Ventricle: Left ventricular ejection fraction, by estimation, is 45 to 50%. The left ventricle has mildly decreased function. The left ventricle demonstrates regional wall motion abnormalities. Definity contrast agent was given IV to delineate the left ventricular endocardial borders. The left ventricular internal cavity size was mildly dilated. There is no left ventricular hypertrophy. The interventricular septum is flattened in systole and diastole,  consistent with right ventricular pressure and  volume overload. Right Ventricle: The right ventricular size is moderately enlarged. Right ventricular systolic function is moderately reduced. There is moderately elevated pulmonary artery systolic pressure. The tricuspid regurgitant velocity is 2.84 m/s, and with an assumed right atrial pressure of 15 mmHg, the estimated right ventricular systolic pressure is 76.2 mmHg. Right Atrium: Right atrial size was severely dilated. Mitral Valve: The mitral valve is normal in structure. Trivial mitral valve regurgitation. Tricuspid Valve: The tricuspid valve is abnormal. 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 Aortic Valve: The aortic valve is tricuspid. Aortic valve regurgitation is not visualized. No aortic stenosis is present. Venous: The inferior vena cava is dilated in size with less than 50% respiratory variability, suggesting right atrial pressure of  15 mmHg. LEFT VENTRICLE PLAX 2D LVIDd:         5.55 cm      Diastology LVIDs:         4.40 cm      LV e' lateral:   7.56 cm/s LV PW:         1.00 cm      LV E/e' lateral: 8.7 LV IVS:        0.85 cm      LV e' medial:    4.05 cm/s                             LV E/e' medial:  16.2  LV Volumes (MOD) LV vol d, MOD A2C: 163.0 ml LV vol d, MOD A4C: 139.0 ml LV vol s, MOD A2C: 90.1 ml LV vol s, MOD A4C: 65.4 ml LV SV MOD A2C:     72.9 ml LV SV MOD A4C:     139.0 ml LV SV MOD BP:      73.8 ml LEFT ATRIUM         Index      RIGHT ATRIUM           Index LA diam:    4.60 cm 2.57 cm/m RA Area:     28.60 cm                                RA Volume:   106.00 ml 59.19 ml/m  AORTIC VALVE LVOT Vmax:   82.20 cm/s LVOT Vmean:  53.800 cm/s LVOT VTI:    0.190 m MITRAL VALVE               TRICUSPID VALVE MV Area (PHT): 2.95 cm    TR Peak grad:   32.3 mmHg MV Decel Time: 257 msec    TR Vmax:        284.00 cm/s MV E velocity: 65.60 cm/s MV A velocity: 69.00 cm/s  SHUNTS MV E/A ratio:  0.95        Systemic VTI: 0.19 m Oswaldo Milian MD Electronically signed by Oswaldo Milian MD Signature Date/Time: 03/05/2020/2:36:28 PM    Final     Labs: BMET Recent Labs  Lab 03/03/20 1718 03/04/20 1010 03/05/20 0402  NA 135  --  139  K 4.8  --  4.0  CL 97*  --  100  CO2 26  --  28  GLUCOSE 200*  --  102*  BUN 58*  --  60*  CREATININE 3.42*  --  2.91*  CALCIUM 9.1  --  9.3  PHOS  --  5.8*  --    CBC Recent Labs  Lab 03/03/20 1718 03/05/20 0402  WBC 4.0 3.7*  NEUTROABS  --  2.2  HGB 9.4* 9.4*  HCT 31.8* 31.5*  MCV 90.9 91.8  PLT 226 220    Medications:    . aspirin EC  81 mg Oral QHS  . atorvastatin  40 mg Oral Daily  . budesonide  0.5 mg Nebulization BID  . carvedilol  3.125 mg Oral BID WC  . furosemide  80 mg Intravenous BID  . heparin  5,000 Units Subcutaneous Q8H  . insulin aspart  0-6 Units Subcutaneous TID WC  . sodium chloride flush  3 mL Intravenous Q12H

## 2020-03-07 LAB — BASIC METABOLIC PANEL
Anion gap: 11 (ref 5–15)
BUN: 54 mg/dL — ABNORMAL HIGH (ref 8–23)
CO2: 29 mmol/L (ref 22–32)
Calcium: 9.1 mg/dL (ref 8.9–10.3)
Chloride: 99 mmol/L (ref 98–111)
Creatinine, Ser: 2.48 mg/dL — ABNORMAL HIGH (ref 0.44–1.00)
GFR calc Af Amer: 20 mL/min — ABNORMAL LOW (ref >60)
GFR calc non Af Amer: 18 mL/min — ABNORMAL LOW (ref >60)
Glucose, Bld: 93 mg/dL (ref 70–99)
Potassium: 4.2 mmol/L (ref 3.5–5.1)
Sodium: 139 mmol/L (ref 135–145)

## 2020-03-07 LAB — GLUCOSE, CAPILLARY
Glucose-Capillary: 90 mg/dL (ref 70–99)
Glucose-Capillary: 141 mg/dL — ABNORMAL HIGH (ref 70–99)

## 2020-03-07 MED ORDER — CARVEDILOL 3.125 MG PO TABS: 3.1250 mg | ORAL_TABLET | Freq: Two times a day (BID) | ORAL | 2 refills | Status: DC

## 2020-03-07 MED ORDER — TORSEMIDE 20 MG PO TABS: 60.0000 mg | ORAL_TABLET | Freq: Every day | ORAL | 2 refills | Status: DC

## 2020-03-07 NOTE — Consult Note (Signed)
[facility] CM Inpatient Consult   03/07/2020  [name] February 26, 1941 [id number]  [place] [facility] [[facility] Patient:   Patient screened for high risk score for unplanned readmission score and for less than 30 day readmission hospitalization, from a skilled facility and notes states returning home with home health at this time. Medical record reviewed and had followed in progression meeting this week to check if potential [place] [facility] service needs.    Primary Care Provider is  Girtha Rm, NP-Cthis provider is listed to provide the transition of care [TOC] for post hospital follow up.  Plan:  Spoke with inpatient TOC team 03/06/20 regarding disposition needs. Will have [facility] RN assigned for out reach. Call attempted but no answer.  Patient transitioning out.  For questions contact:   Natividad Brood, RN BSN [facility] Liaison  [phone] business mobile phone Toll free office [phone]  Fax number: [phone] [email or web address]

## 2020-03-07 NOTE — Progress Notes (Signed)
D/C instructions given and reviewed with daughter at bedside. Questions answered at this time and encouraged to call with any further questions. IV and tele removed, tolerated well.

## 2020-03-07 NOTE — Progress Notes (Addendum)
Cardiology Progress Note  Patient ID: [name] MRN: [id number] DOB: 1940/11/24 Date of Encounter: 03/07/2020  Primary Cardiologist: Sanda Klein, MD  Subjective   Chief Complaint: SOB improving.  HPI: SOB improving. Not back to baseline.   ROS:  All other ROS reviewed and negative. Pertinent positives noted in the HPI.     Inpatient Medications  Scheduled Meds: . aspirin EC  81 mg Oral QHS  . atorvastatin  40 mg Oral Daily  . budesonide  0.5 mg Nebulization BID  . carvedilol  3.125 mg Oral BID WC  . heparin  5,000 Units Subcutaneous Q8H  . insulin aspart  0-6 Units Subcutaneous TID WC  . polyethylene glycol  17 g Oral Daily  . sodium chloride flush  3 mL Intravenous Q12H  . torsemide  60 mg Oral Daily   Continuous Infusions: . sodium chloride     PRN Meds: sodium chloride, acetaminophen, ipratropium-albuterol, ondansetron (ZOFRAN) IV, senna-docusate, sodium chloride flush   Vital Signs   Vitals:   03/06/20 2100 03/07/20 0043 03/07/20 0455 03/07/20 0515  BP:  (!) 112/46 (!) 115/58   Pulse:  (!) 59 (!) 58   Resp:  17 20   Temp:  99 F (37.2 C) 99 F (37.2 C)   TempSrc:  Oral Oral   SpO2: 98% 97% 100%   Weight:    76.4 kg  Height:        Intake/Output Summary (Last 24 hours) at 03/07/2020 0806 Last data filed at 03/07/2020 0517 Gross per 24 hour  Intake 720 ml  Output 504 ml  Net 216 ml   Last 3 Weights 03/07/2020 03/06/2020 03/05/2020  Weight (lbs) 168 lb 6.4 oz 170 lb 11.2 oz 171 lb 8.3 oz  Weight (kg) 76.386 kg 77.429 kg 77.8 kg      Telemetry  Overnight telemetry shows A paced rhythm, which I personally reviewed.   ECG  The most recent ECG shows A paced rhythm, which I personally reviewed.   Physical Exam   Vitals:   03/06/20 2100 03/07/20 0043 03/07/20 0455 03/07/20 0515  BP:  (!) 112/46 (!) 115/58   Pulse:  (!) 59 (!) 58   Resp:  17 20   Temp:  99 F (37.2 C) 99 F (37.2 C)   TempSrc:  Oral Oral   SpO2: 98% 97% 100%   Weight:     76.4 kg  Height:         Intake/Output Summary (Last 24 hours) at 03/07/2020 0806 Last data filed at 03/07/2020 0517 Gross per 24 hour  Intake 720 ml  Output 504 ml  Net 216 ml    Last 3 Weights 03/07/2020 03/06/2020 03/05/2020  Weight (lbs) 168 lb 6.4 oz 170 lb 11.2 oz 171 lb 8.3 oz  Weight (kg) 76.386 kg 77.429 kg 77.8 kg    Body mass index is 30.8 kg/m.  General: Well nourished, well developed, in no acute distress Head: Atraumatic, normal size  Eyes: PEERLA, EOMI  Neck: Supple, +JVD Endocrine: No thryomegaly Cardiac: Normal S1, S2; 3/6 HSM Lungs: +rales Abd: Soft, nontender, no hepatomegaly  Ext: No edema, pulses 2+ Musculoskeletal: No deformities, BUE and BLE strength normal and equal Skin: Warm and dry, no rashes   Neuro: Alert and oriented to person, place, time, and situation, CNII-XII grossly intact, no focal deficits  Psych: Normal mood and affect   Labs  High Sensitivity Troponin:   Recent Labs  Lab 02/11/20 2014 02/11/20 2247  TROPONINIHS 17 18*  Cardiac EnzymesNo results for input(s): TROPONINI in the last 168 hours. No results for input(s): TROPIPOC in the last 168 hours.  Chemistry Recent Labs  Lab 03/03/20 1718 03/04/20 1010 03/05/20 0402 03/06/20 0855 03/07/20 0447  NA   < >  --  139 138 139  K   < >  --  4.0 4.0 4.2  CL   < >  --  100 98 99  CO2   < >  --  _0 GLUCOSE   < >  --  102* 141* 93  BUN   < >  --  60* 52* 54*  CREATININE   < >  --  2.91* 2.40* 2.48*  CALCIUM   < >  --  9.3 9.2 9.1  PROT  --  7.1  --   --   --   ALBUMIN  --  3.7  --   --   --   AST  --  16  --   --   --   ALT  --  15  --   --   --   ALKPHOS  --  109  --   --   --   BILITOT  --  1.0  --   --   --   GFRNONAA   < >  --  15* 18* 18*  GFRAA   < >  --  17* 21* 20*  ANIONGAP   < >  --  _1 < > = values in this interval not displayed.    Hematology Recent Labs  Lab 03/03/20 1718 03/05/20 0402  WBC 4.0 3.7*  RBC 3.50* 3.43*  HGB 9.4* 9.4*  HCT  31.8* 31.5*  MCV 90.9 91.8  MCH 26.9 27.4  MCHC 29.6* 29.8*  RDW 16.7* 16.9*  PLT 226 220   BNP Recent Labs  Lab 03/04/20 0940  BNP 466.5*    DDimer No results for input(s): DDIMER in the last 168 hours.   Radiology  ECHOCARDIOGRAM LIMITED  Result Date: 03/05/2020    ECHOCARDIOGRAM LIMITED REPORT   Patient Name:   [name] Date of Exam: 03/05/2020 Medical Rec #:  [id number]       Height:       62.0 in Accession #:    [id number]      Weight:       171.5 lb Date of Birth:  1940-09-13        BSA:          1.791 m Patient Age:    79 years        BP:           109/60 mmHg Patient Gender: F               HR:           61 bpm. Exam Location:  Inpatient Procedure: Limited Echo, Intracardiac Opacification Agent, Limited Color Doppler            and Cardiac Doppler Indications:    CHF-Acute Systolic 094.70 / J62.83  History:        Patient has prior history of Echocardiogram examinations, most                 recent 02/12/2020. Defibrillator, Pulmonary HTN and COPD,                 Signs/Symptoms:Hypotension; Risk Factors:Former Smoker,  Diabetes, Hypertension and Sleep Apnea. Acute on chronic                 respiratory failure secondary to systolic CHF, Cor Pulmonale,                 Chronic kidney disease, secondary anemia.  Sonographer:    Darlina Sicilian RDCS Referring Phys: [id number] [place]  1. Left ventricular ejection fraction, by estimation, is 45 to 50%. The left ventricle has mildly decreased function. The left ventricle demonstrates regional wall motion abnormalities. The left ventricular internal cavity size was mildly dilated. There  is the interventricular septum is flattened in systole and diastole, consistent with right ventricular pressure and volume overload.  2. Right ventricular systolic function is moderately reduced. The right ventricular size is moderately enlarged. There is moderately elevated pulmonary artery systolic pressure. The estimated  right ventricular systolic pressure is 11.9 mmHg.  3. Right atrial size was severely dilated.  4. The mitral valve is normal in structure. Trivial mitral valve regurgitation.  5. The tricuspid valve is abnormal. 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  6. The aortic valve is tricuspid. Aortic valve regurgitation is not visualized. No aortic stenosis is present.  7. The inferior vena cava is dilated in size with <50% respiratory variability, suggesting right atrial pressure of 15 mmHg. FINDINGS  Left Ventricle: Left ventricular ejection fraction, by estimation, is 45 to 50%. The left ventricle has mildly decreased function. The left ventricle demonstrates regional wall motion abnormalities. Definity contrast agent was given IV to delineate the left ventricular endocardial borders. The left ventricular internal cavity size was mildly dilated. There is no left ventricular hypertrophy. The interventricular septum is flattened in systole and diastole, consistent with right ventricular pressure and  volume overload. Right Ventricle: The right ventricular size is moderately enlarged. Right ventricular systolic function is moderately reduced. There is moderately elevated pulmonary artery systolic pressure. The tricuspid regurgitant velocity is 2.84 m/s, and with an assumed right atrial pressure of 15 mmHg, the estimated right ventricular systolic pressure is 14.7 mmHg. Right Atrium: Right atrial size was severely dilated. Mitral Valve: The mitral valve is normal in structure. Trivial mitral valve regurgitation. Tricuspid Valve: The tricuspid valve is abnormal. 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 Aortic Valve: The aortic valve is tricuspid. Aortic valve regurgitation is not visualized. No aortic stenosis is present. Venous: The inferior vena cava is dilated in size with less than 50% respiratory variability, suggesting right atrial pressure of 15 mmHg. LEFT VENTRICLE PLAX 2D LVIDd:         5.55  cm      Diastology LVIDs:         4.40 cm      LV e' lateral:   7.56 cm/s LV PW:         1.00 cm      LV E/e' lateral: 8.7 LV IVS:        0.85 cm      LV e' medial:    4.05 cm/s                             LV E/e' medial:  16.2  LV Volumes (MOD) LV vol d, MOD A2C: 163.0 ml LV vol d, MOD A4C: 139.0 ml LV vol s, MOD A2C: 90.1 ml LV vol s, MOD A4C: 65.4 ml LV SV MOD A2C:     72.9 ml LV SV MOD  A4C:     139.0 ml LV SV MOD BP:      73.8 ml LEFT ATRIUM         Index      RIGHT ATRIUM           Index LA diam:    4.60 cm 2.57 cm/m RA Area:     28.60 cm                                RA Volume:   106.00 ml 59.19 ml/m  AORTIC VALVE LVOT Vmax:   82.20 cm/s LVOT Vmean:  53.800 cm/s LVOT VTI:    0.190 m MITRAL VALVE               TRICUSPID VALVE MV Area (PHT): 2.95 cm    TR Peak grad:   32.3 mmHg MV Decel Time: 257 msec    TR Vmax:        284.00 cm/s MV E velocity: 65.60 cm/s MV A velocity: 69.00 cm/s  SHUNTS MV E/A ratio:  0.95        Systemic VTI: 0.19 m Oswaldo Milian MD Electronically signed by Oswaldo Milian MD Signature Date/Time: 03/05/2020/2:36:28 PM    Final     Cardiac Studies  TTE 03/05/2020 1. Left ventricular ejection fraction, by estimation, is 45 to 50%. The  left ventricle has mildly decreased function. The left ventricle  demonstrates regional wall motion abnormalities. The left ventricular  internal cavity size was mildly dilated. There  is the interventricular septum is flattened in systole and diastole,  consistent with right ventricular pressure and volume overload.  2. Right ventricular systolic function is moderately reduced. The right  ventricular size is moderately enlarged. There is moderately elevated  pulmonary artery systolic pressure. The estimated right ventricular  systolic pressure is 73.7 mmHg.  3. Right atrial size was severely dilated.  4. The mitral valve is normal in structure. Trivial mitral valve  regurgitation.  5. The tricuspid valve is abnormal.  Tricuspid valve regurgitation is  moderate to severe.  6. The aortic valve is tricuspid. Aortic valve regurgitation is not  visualized. No aortic stenosis is present.  7. The inferior vena cava is dilated in size with <50% respiratory  variability, suggesting right atrial pressure of 15 mmHg.   Patient Profile  ROSETTA RUPNOW is a 79 y.o. 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   Assessment & Plan   1.  Right heart failure/cor pulmonale secondary to severe pulmonary pretension, severe tricuspid regurgitation/systolic heart failure ejection fraction 45 to 50% -She appears euvolemic today.  Tolerating torsemide 60 mg daily well. -Creatinine has come down and reached its nadir -Back on Coreg 3.125 mg twice daily.  Would recommend to continue this. -Would recommend to continue her torsemide 60 mg daily -I would hold her amlodipine moving forward.  I think she is stable for discharge from a heart failure standpoint. -She will need home health set up as well as home oxygen.  May not go today.  2.  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  This could've made her acutely worse. -Limited echo unchanged.  [facility] will sign off.   Medication Recommendations: Coreg 3.125 mg twice daily, torsemide 60 mg daily, stop home amlodipine Other recommendations (labs, testing, etc): None Follow up as an outpatient: We will  arrange outpatient follow-up in 1 to 2 weeks  For questions or updates, please contact [place] Please consult [email or web address] for contact info under   Time Spent with Patient: I have spent a total of 25 minutes with patient reviewing hospital notes, telemetry, EKGs, labs and examining the patient as well as establishing an assessment and plan that was discussed with the patient.  > 50% of time was spent in direct patient care.     Signed, Addison Naegeli. Audie Box, [place]  03/07/2020 8:06 AM

## 2020-03-07 NOTE — Plan of Care (Signed)
  Problem: Education: Goal: Ability to demonstrate management of disease process will improve Outcome: Progressing Goal: Ability to verbalize understanding of medication therapies will improve Outcome: Progressing Goal: Individualized Educational Video(s) Outcome: Progressing   Problem: Activity: Goal: Capacity to carry out activities will improve Outcome: Progressing   Problem: Cardiac: Goal: Ability to achieve and maintain adequate cardiopulmonary perfusion will improve Outcome: Progressing   Problem: Education: Goal: Ability to describe self-care measures that may prevent or decrease complications (Diabetes Survival Skills Education) will improve Outcome: Progressing Goal: Individualized Educational Video(s) Outcome: Progressing   Problem: Coping: Goal: Ability to adjust to condition or change in health will improve Outcome: Progressing   Problem: Fluid Volume: Goal: Ability to maintain a balanced intake and output will improve Outcome: Progressing   Problem: Health Behavior/Discharge Planning: Goal: Ability to identify and utilize available resources and services will improve Outcome: Progressing Goal: Ability to manage health-related needs will improve Outcome: Progressing   Problem: Metabolic: Goal: Ability to maintain appropriate glucose levels will improve Outcome: Progressing   Problem: Nutritional: Goal: Maintenance of adequate nutrition will improve Outcome: Progressing Goal: Progress toward achieving an optimal weight will improve Outcome: Progressing   Problem: Skin Integrity: Goal: Risk for impaired skin integrity will decrease Outcome: Progressing   Problem: Tissue Perfusion: Goal: Adequacy of tissue perfusion will improve Outcome: Progressing

## 2020-03-07 NOTE — Progress Notes (Signed)
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 Follows with Dr. [name]. Admit Cr 3.4. -AKI likely secondary to cardiorenal syndrome and hypotension leading to decreased EABV/renal perfusion. Cr 3.4>2.9>2.4 yesterday. Cr stable at 2.5 today -oral diuresis -renal u/s without obstruction -UA bland -hopeful for further recovery will see where her kidney function plateaus -Continue to monitor daily Cr, Dose meds for GFR<15 -Monitor Daily I/Os, Daily weight  -Maintain MAP>65 for optimal renal perfusion.   Acute on chronic respiratory failure secondary to systolic CHF -given significant improvement in volume status, continue with torsemide 34m daily for now -appreciate assistance from cardiology.  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 Repeat echo pending  Hypotension, likely med effect from sildenafil. H/o HTN -coreg restarted per cardiology. Sildenafil on hold.  Chronic respiratory insufficiency with restrictive lung disease and OSA: Nebs as needed.  CPAP nightly. Chronic home o2, needs this to be set up.  Anemia of chronic disease -Transfuse for Hgb<7 g/dL.  Hemoglobin stable, monitor for now.  Diabetes Mellitus type 2: mgmt per primary service  Okay from nephrology perspective for discharge, discussed with daughter to make a follow up appointment at our office with Dr. BCarolin Sicks  VGean Quint MD [facility] 03/05/2020, 9:54 AM    Subjective:   No acute events.  Continues to feel better from a breathing standpoint, still feels like she has ways to go. Eager to go home.   Objective:   BP (!) 115/58 (BP Location: Right Arm)   Pulse (!) 58   Temp 99 F (37.2 C) (Oral)   Resp 20   Ht _0  (1.575 m)   Wt 76.4 kg   SpO2 98%   BMI 30.80  kg/m   Intake/Output Summary (Last 24 hours) at 03/07/2020 1024 Last data filed at 03/07/2020 02671Gross per 24 hour  Intake 720 ml  Output 504 ml  Net 216 ml   Weight change: -1.414 kg  Physical Exam: Gen:NAD, sitting up in chair CVS:s1s2, +murmur, no r/g Resp: unlabored, bl chest expansion, on [place] AIWP:YKDX nt/nd Ext: no edema bl le's (improved) Skin: no rash Neuro: awake, alert, oriented, speech clear and coherent, moves all ext spontaneously  Imaging: ECHOCARDIOGRAM LIMITED  Result Date: 03/05/2020    ECHOCARDIOGRAM LIMITED REPORT   Patient Name:   Erin NAKAJIMADate of Exam: 03/05/2020 Medical Rec #:  [id number]      Height:       62.0 in Accession #:    [id number]     Weight:       171.5 lb Date of Birth:  104-Feb-1942       BSA:          1.791 m Patient Age:    89years        BP:           109/60 mmHg Patient Gender: F               HR:           61 bpm. Exam Location:  Inpatient Procedure: Limited Echo, Intracardiac Opacification Agent, Limited Color Doppler            and Cardiac Doppler Indications:    CHF-Acute Systolic 4902.40/ IX73.53 History:        Patient has prior history of Echocardiogram examinations, most  recent 02/12/2020. Defibrillator, Pulmonary HTN and COPD,                 Signs/Symptoms:Hypotension; Risk Factors:Former Smoker,                 Diabetes, Hypertension and Sleep Apnea. Acute on chronic                 respiratory failure secondary to systolic CHF, Cor Pulmonale,                 Chronic kidney disease, secondary anemia.  Sonographer:    Darlina Sicilian RDCS Referring Phys: [id number] [place]  1. Left ventricular ejection fraction, by estimation, is 45 to 50%. The left ventricle has mildly decreased function. The left ventricle demonstrates regional wall motion abnormalities. The left ventricular internal cavity size was mildly dilated. There  is the interventricular septum is flattened in systole and diastole, consistent  with right ventricular pressure and volume overload.  2. Right ventricular systolic function is moderately reduced. The right ventricular size is moderately enlarged. There is moderately elevated pulmonary artery systolic pressure. The estimated right ventricular systolic pressure is 94.8 mmHg.  3. Right atrial size was severely dilated.  4. The mitral valve is normal in structure. Trivial mitral valve regurgitation.  5. The tricuspid valve is abnormal. 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  6. The aortic valve is tricuspid. Aortic valve regurgitation is not visualized. No aortic stenosis is present.  7. The inferior vena cava is dilated in size with <50% respiratory variability, suggesting right atrial pressure of 15 mmHg. FINDINGS  Left Ventricle: Left ventricular ejection fraction, by estimation, is 45 to 50%. The left ventricle has mildly decreased function. The left ventricle demonstrates regional wall motion abnormalities. Definity contrast agent was given IV to delineate the left ventricular endocardial borders. The left ventricular internal cavity size was mildly dilated. There is no left ventricular hypertrophy. The interventricular septum is flattened in systole and diastole, consistent with right ventricular pressure and  volume overload. Right Ventricle: The right ventricular size is moderately enlarged. Right ventricular systolic function is moderately reduced. There is moderately elevated pulmonary artery systolic pressure. The tricuspid regurgitant velocity is 2.84 m/s, and with an assumed right atrial pressure of 15 mmHg, the estimated right ventricular systolic pressure is 54.6 mmHg. Right Atrium: Right atrial size was severely dilated. Mitral Valve: The mitral valve is normal in structure. Trivial mitral valve regurgitation. Tricuspid Valve: The tricuspid valve is abnormal. 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 Aortic Valve: The aortic valve is tricuspid. Aortic valve  regurgitation is not visualized. No aortic stenosis is present. Venous: The inferior vena cava is dilated in size with less than 50% respiratory variability, suggesting right atrial pressure of 15 mmHg. LEFT VENTRICLE PLAX 2D LVIDd:         5.55 cm      Diastology LVIDs:         4.40 cm      LV e' lateral:   7.56 cm/s LV PW:         1.00 cm      LV E/e' lateral: 8.7 LV IVS:        0.85 cm      LV e' medial:    4.05 cm/s                             LV E/e' medial:  16.2  LV  Volumes (MOD) LV vol d, MOD A2C: 163.0 ml LV vol d, MOD A4C: 139.0 ml LV vol s, MOD A2C: 90.1 ml LV vol s, MOD A4C: 65.4 ml LV SV MOD A2C:     72.9 ml LV SV MOD A4C:     139.0 ml LV SV MOD BP:      73.8 ml LEFT ATRIUM         Index      RIGHT ATRIUM           Index LA diam:    4.60 cm 2.57 cm/m RA Area:     28.60 cm                                RA Volume:   106.00 ml 59.19 ml/m  AORTIC VALVE LVOT Vmax:   82.20 cm/s LVOT Vmean:  53.800 cm/s LVOT VTI:    0.190 m MITRAL VALVE               TRICUSPID VALVE MV Area (PHT): 2.95 cm    TR Peak grad:   32.3 mmHg MV Decel Time: 257 msec    TR Vmax:        284.00 cm/s MV E velocity: 65.60 cm/s MV A velocity: 69.00 cm/s  SHUNTS MV E/A ratio:  0.95        Systemic VTI: 0.19 m Oswaldo Milian MD Electronically signed by Oswaldo Milian MD Signature Date/Time: 03/05/2020/2:36:28 PM    Final     Labs: BMET Recent Labs  Lab 03/03/20 1718 03/04/20 1010 03/05/20 0402 03/06/20 0855 03/07/20 0447  NA 135  --  139 138 139  K 4.8  --  4.0 4.0 4.2  CL 97*  --  100 98 99  CO2 26  --  _0 GLUCOSE 200*  --  102* 141* 93  BUN 58*  --  60* 52* 54*  CREATININE 3.42*  --  2.91* 2.40* 2.48*  CALCIUM 9.1  --  9.3 9.2 9.1  PHOS  --  5.8*  --   --   --    CBC Recent Labs  Lab 03/03/20 1718 03/05/20 0402  WBC 4.0 3.7*  NEUTROABS  --  2.2  HGB 9.4* 9.4*  HCT 31.8* 31.5*  MCV 90.9 91.8  PLT 226 220    Medications:    . aspirin EC  81 mg Oral QHS  . atorvastatin  40 mg Oral  Daily  . budesonide  0.5 mg Nebulization BID  . carvedilol  3.125 mg Oral BID WC  . heparin  5,000 Units Subcutaneous Q8H  . insulin aspart  0-6 Units Subcutaneous TID WC  . polyethylene glycol  17 g Oral Daily  . sodium chloride flush  3 mL Intravenous Q12H  . torsemide  60 mg Oral Daily

## 2020-03-07 NOTE — Plan of Care (Signed)
  Problem: Education: Goal: Ability to demonstrate management of disease process will improve 03/07/2020 1252 by Mickie Kay, RN Outcome: Adequate for Discharge 03/07/2020 1032 by Mickie Kay, RN Outcome: Progressing Goal: Ability to verbalize understanding of medication therapies will improve 03/07/2020 1252 by Mickie Kay, RN Outcome: Adequate for Discharge 03/07/2020 1032 by Mickie Kay, RN Outcome: Progressing Goal: Individualized Educational Video(s) 03/07/2020 1252 by Mickie Kay, RN Outcome: Adequate for Discharge 03/07/2020 1032 by Mickie Kay, RN Outcome: Progressing   Problem: Activity: Goal: Capacity to carry out activities will improve 03/07/2020 1252 by Mickie Kay, RN Outcome: Adequate for Discharge 03/07/2020 1032 by Mickie Kay, RN Outcome: Progressing   Problem: Cardiac: Goal: Ability to achieve and maintain adequate cardiopulmonary perfusion will improve 03/07/2020 1252 by Mickie Kay, RN Outcome: Adequate for Discharge 03/07/2020 1032 by Mickie Kay, RN Outcome: Progressing   Problem: Education: Goal: Ability to describe self-care measures that may prevent or decrease complications (Diabetes Survival Skills Education) will improve 03/07/2020 1252 by Mickie Kay, RN Outcome: Adequate for Discharge 03/07/2020 1032 by Mickie Kay, RN Outcome: Progressing Goal: Individualized Educational Video(s) 03/07/2020 1252 by Mickie Kay, RN Outcome: Adequate for Discharge 03/07/2020 1032 by Mickie Kay, RN Outcome: Progressing   Problem: Coping: Goal: Ability to adjust to condition or change in health will improve 03/07/2020 1252 by Mickie Kay, RN Outcome: Adequate for Discharge 03/07/2020 1032 by Mickie Kay, RN Outcome: Progressing   Problem: Fluid Volume: Goal: Ability to maintain a balanced intake and output will improve 03/07/2020 1252 by Mickie Kay, RN Outcome: Adequate for  Discharge 03/07/2020 1032 by Mickie Kay, RN Outcome: Progressing   Problem: Health Behavior/Discharge Planning: Goal: Ability to identify and utilize available resources and services will improve 03/07/2020 1252 by Mickie Kay, RN Outcome: Adequate for Discharge 03/07/2020 1032 by Mickie Kay, RN Outcome: Progressing Goal: Ability to manage health-related needs will improve 03/07/2020 1252 by Mickie Kay, RN Outcome: Adequate for Discharge 03/07/2020 1032 by Mickie Kay, RN Outcome: Progressing   Problem: Metabolic: Goal: Ability to maintain appropriate glucose levels will improve 03/07/2020 1252 by Mickie Kay, RN Outcome: Adequate for Discharge 03/07/2020 1032 by Mickie Kay, RN Outcome: Progressing   Problem: Nutritional: Goal: Maintenance of adequate nutrition will improve 03/07/2020 1252 by Mickie Kay, RN Outcome: Adequate for Discharge 03/07/2020 1032 by Mickie Kay, RN Outcome: Progressing Goal: Progress toward achieving an optimal weight will improve 03/07/2020 1252 by Mickie Kay, RN Outcome: Adequate for Discharge 03/07/2020 1032 by Mickie Kay, RN Outcome: Progressing   Problem: Skin Integrity: Goal: Risk for impaired skin integrity will decrease 03/07/2020 1252 by Mickie Kay, RN Outcome: Adequate for Discharge 03/07/2020 1032 by Mickie Kay, RN Outcome: Progressing   Problem: Tissue Perfusion: Goal: Adequacy of tissue perfusion will improve 03/07/2020 1252 by Mickie Kay, RN Outcome: Adequate for Discharge 03/07/2020 1032 by Mickie Kay, RN Outcome: Progressing

## 2020-03-07 NOTE — Discharge Summary (Signed)
Physician Discharge Summary  [name] DBZ:[id number] DOB: Feb 16, 1941 DOA: 03/03/2020  PCP: Girtha Rm, NP-C  Admit date: 03/03/2020 Discharge date: 03/07/2020  Admitted From: Home  Discharge disposition: Home with home health   Recommendations for Outpatient Follow-Up:   . Follow up with your primary care provider in one week.  . Check CBC, BMP, mg in the next visit . Follow-up with cardiology, 1 to 2 weeks. . Follow-up with nephrology Dr. [name], in 1 to 2 weeks.   Discharge Diagnosis:   Principal Problem:   Acute on chronic systolic CHF (congestive heart failure) (HCC) Active Problems:   OSA on CPAP   ICD (implantable cardioverter-defibrillator) in place   PAH (pulmonary artery hypertension) ([place])   Hypercholesterolemia   Acute on chronic respiratory failure with hypoxia (HCC)   Diabetes mellitus type 2, diet-controlled ([place])   Transient hypotension   Acute renal failure superimposed on stage 4 chronic kidney disease ([place])    Discharge Condition: Improved.  Diet recommendation: Low sodium, heart healthy.  Carbohydrate-modified.    Wound care: None.  Code status: Full.   History of Present Illness:   79 y.o.femalewith past medical history significant ofhypertension, COPD, systolic congestive heart failure  followed by Dr.Croitoru, PAH, nonobstructive CAD, DM type II controlled, CKD stage IV followed by Dr.Bhandari, and OSA on CPAP presented to the hospital with progressively worsening shortness of breath for 4 days. Just recently hospitalized, 7/25-8/5 for acute on chronic systolic congestive heart failure with EF found to be 45 to 50% with global hypokinesis and elevated pulmonary artery pressures. Prior to that hospitalization, patient only used oxygen at night with CPAP. She reported progressively worsening swelling of her lower extremities, weight gain, and had been wearing 2 L nasal cannula oxygen continuously. Upon admission into the  emergency department, patient was seen to be afebrile with systolic blood pressuresinitially as low as 86/50, and O2 saturations maintained on4-6 L of nasal. Labs were significant for hemoglobin 9.4, BUN 58, creatinine 3.42, glucose 200, and BNP 466.5. Chest x-ray significant for cardiomegaly with mild vascular congestion. Patient was ordered 60 mg of Lasix IV.  Patient was then admitted hospital for further evaluation and treatment.   Hospital Course:   Following conditions were addressed during hospitalization as listed below,  Acute on chronic respiratory failure with hypoxia secondary to systolic congestive heart failure: On 2 L of oxygen by nasal cannula at home. Last EF noted to be 45-50% with global hypokinesis and signs of cor pulmonale on 7/26.  Cardiology on board and patient was on 80 mg of Lasix IV Lasix twice a day.  This has been changed to torsemide 60 mg oral daily starting 03/06/20.  This dose will be continued on discharge.   Continue daily weights and fluid restriction at home.  Transient hypotension: Home medications included amlodipine 10 mg daily, Coreg 25 mg twice daily, andtorsemide 60 mg daily.    Patient was changed to Coreg 3.125 twice a day, and amlodipine has been discontinued.  Torsemide will be continued at 60 mg daily.  Blood pressure has improved at this time.  Acute kidney injury on chronic kidney disease stage IV: Patient presented with creatinine elevated up to 3.42 with BUN 58. Baseline creatinine previously noted to be around 2. Possibility of cardiorenal syndrome. Creatinine of 2.4 prior to discharge.  Patient is [facility] in the outpatient setting.    Renal ultrasound without obstruction.  Urinalysis was bland.  Nephrology followed the patient  during hospitalization.  Recommend torsemide 60 mg on discharge.  Follow-up with nephrology as outpatient.  Restrictive lung disease/chronic respiratory failure prior  PFTs had shown significant restrictive lung disease with marked reduced DLCO.Patient will continue oxygen nebulizers inhalers at home.  Complete heart blocks/p ICD .  No acute issues.  Pulmonary artery hypertension: Thought secondary to chronic restrictive lung disease and obstructive sleep apnea. Right heart catheterization from 7/29 showed severe pulmonary artery hypertension.    Discontinue sildenafil..  2D echocardiogram limited repeated on 03/05/2020 showed LV ejection fraction of 45 to 50% with evidence of volume overload and moderately reduced right ventricular systolic function.  Diabetes mellitus type 2:  last hemoglobin A1c 6.5on 7/29.    Received sliding scale insulin during hospitalization.  Continue diabetic diet, Accu-Cheks.  Anemia of chronic disease: Latest hemoglobin at 9.4.  No evidence of bleeding.  Hyperlipidemia Continue statins  Debility: Patient had been in rehab since her last hospitalization.  Patient's family wished home health on discharge at this time  Obstructive sleep apnea on CPAP -Has been using CPAP during hospitalization and has greatly benefited from this.  We will continue oxygen during daytime and CPAP at nighttime and during sleeping..    Disposition.  At this time, patient is stable for disposition home with home health.  Spoke with the patient daughter at bedside.  Medical Consultants:    Cardiology  Nephrology  Procedures:    None Subjective:   Today, patient feels better.  Wishes to go home.  Denies any  chest pain, palpitation, improvement of shortness of breath.  Discharge Exam:   Vitals:   03/07/20 0455 03/07/20 0844  BP: (!) 115/58   Pulse: (!) 58   Resp: 20   Temp: 99 F (37.2 C)   SpO2: 100% 98%   Vitals:   03/07/20 0043 03/07/20 0455 03/07/20 0515 03/07/20 0844  BP: (!) 112/46 (!) 115/58    Pulse: (!) 59 (!) 58    Resp: 17 20    Temp: 99 F (37.2 C) 99 F (37.2 C)    TempSrc: Oral Oral    SpO2: 97% 100%   98%  Weight:   76.4 kg   Height:        General: Alert awake, not in obvious distress, obese, on nasal cannula 2 L/min HENT: pupils equally reacting to light,  No scleral pallor or icterus noted. Oral mucosa is moist.  Chest: Coarse breath sounds noted.  Diminished breath sounds bilaterally. No crackles or wheezes.  Left chest wall AICD in place, CVS: S1 &S2 heard. No murmur.  Regular rate and rhythm. Abdomen: Soft, nontender, nondistended.  Bowel sounds are heard.   Extremities: No cyanosis, clubbing, trace edema. peripheral pulses are palpable. Psych: Alert, awake and oriented, normal mood CNS:  No cranial nerve deficits.  Power equal in all extremities.   Skin: Warm and dry.  No rashes noted.  The results of significant diagnostics from this hospitalization (including imaging, microbiology, ancillary and laboratory) are listed below for reference.     Diagnostic Studies:   DG Chest 2 View  Result Date: 03/03/2020 CLINICAL DATA:  Shortness of breath and lower extremity edema. EXAM: CHEST - 2 VIEW COMPARISON:  02/19/2020 FINDINGS: Left-sided pacemaker unchanged. Lungs are somewhat hypoinflated without focal airspace consolidation or effusion. Subtle hazy prominence of the central perihilar markings likely mild vascular congestion. Stable moderate cardiomegaly. Remainder of the exam is unchanged. IMPRESSION: Cardiomegaly with suggestion of minimal vascular congestion. Electronically Signed   By: Marin Olp  M.D.   On: 03/03/2020 17:31   US RENAL  Result Date: 03/04/2020 CLINICAL DATA:  Acute kidney injury.  CKD. EXAM: RENAL / URINARY TRACT ULTRASOUND COMPLETE COMPARISON:  12/22/2017 FINDINGS: Right Kidney: Renal measurements: 9.2 x 3.6 x 4.8 cm = volume: 83 mL . Echogenicity within normal limits. No mass or hydronephrosis visualized. Left Kidney: Renal measurements: 9.2 x 4.8 x 4.5 cm = volume: 105 mL. Echogenicity within normal limits. No mass or hydronephrosis visualized. Bladder:  Appears normal for degree of bladder distention. Other: None. IMPRESSION: Unremarkable ultrasound of the kidneys and bladder. No evidence of obstructive uropathy. Electronically Signed   By: Davina Poke D.O.   On: 03/04/2020 16:19     Labs:   Basic Metabolic Panel: Recent Labs  Lab 03/03/20 1718 03/03/20 1718 03/04/20 1010 03/05/20 0402 03/05/20 0402 03/06/20 0855 03/07/20 0447  NA 135  --   --  139  --  138 139  K 4.8   < >  --  4.0   < > 4.0 4.2  CL 97*  --   --  100  --  98 99  CO2 26  --   --  28  --  30 29  GLUCOSE 200*  --   --  102*  --  141* 93  BUN 58*  --   --  60*  --  52* 54*  CREATININE 3.42*  --   --  2.91*  --  2.40* 2.48*  CALCIUM 9.1  --   --  9.3  --  9.2 9.1  MG  --   --  2.5* 2.6*  --  2.4  --   PHOS  --   --  5.8*  --   --   --   --    < > = values in this interval not displayed.   GFR Estimated Creatinine Clearance: 17 mL/min (A) (by C-G formula based on SCr of 2.48 mg/dL (H)). Liver Function Tests: Recent Labs  Lab 03/04/20 1010  AST 16  ALT 15  ALKPHOS 109  BILITOT 1.0  PROT 7.1  ALBUMIN 3.7   No results for input(s): LIPASE, AMYLASE in the last 168 hours. No results for input(s): AMMONIA in the last 168 hours. Coagulation profile No results for input(s): INR, PROTIME in the last 168 hours.  CBC: Recent Labs  Lab 03/03/20 1718 03/05/20 0402  WBC 4.0 3.7*  NEUTROABS  --  2.2  HGB 9.4* 9.4*  HCT 31.8* 31.5*  MCV 90.9 91.8  PLT 226 220   Cardiac Enzymes: No results for input(s): CKTOTAL, CKMB, CKMBINDEX, TROPONINI in the last 168 hours. BNP: Invalid input(s): POCBNP CBG: Recent Labs  Lab 03/06/20 1209 03/06/20 1717 03/06/20 2057 03/07/20 0559 03/07/20 1054  GLUCAP 173* 127* 204* 90 141*   D-Dimer No results for input(s): DDIMER in the last 72 hours. Hgb A1c No results for input(s): HGBA1C in the last 72 hours. Lipid Profile No results for input(s): CHOL, HDL, LDLCALC, TRIG, CHOLHDL, LDLDIRECT in the last 72  hours. Thyroid function studies No results for input(s): TSH, T4TOTAL, T3FREE, THYROIDAB in the last 72 hours.  Invalid input(s): FREET3 Anemia work up No results for input(s): VITAMINB12, FOLATE, FERRITIN, TIBC, IRON, RETICCTPCT in the last 72 hours. Microbiology Recent Results (from the past 240 hour(s))  SARS Coronavirus 2 by RT PCR (hospital order, performed in [facility] hospital lab) Nasopharyngeal Nasopharyngeal Swab     Status: None   Collection Time: 03/04/20  8:38 AM  Specimen: Nasopharyngeal Swab  Result Value Ref Range Status   SARS Coronavirus 2 NEGATIVE NEGATIVE Final    Comment: (NOTE) SARS-CoV-2 target nucleic acids are NOT DETECTED.  The SARS-CoV-2 RNA is generally detectable in upper and lower respiratory specimens during the acute phase of infection. The lowest concentration of SARS-CoV-2 viral copies this assay can detect is 250 copies / mL. A negative result does not preclude SARS-CoV-2 infection and should not be used as the sole basis for treatment or other patient management decisions.  A negative result may occur with improper specimen collection / handling, submission of specimen other than nasopharyngeal swab, presence of viral mutation(s) within the areas targeted by this assay, and inadequate number of viral copies (<250 copies / mL). A negative result must be combined with clinical observations, patient history, and epidemiological information.  Fact Sheet for Patients:   StrictlyIdeas.no  Fact Sheet for Healthcare Providers: BankingDealers.co.za  This test is not yet approved or  cleared by the [place] FDA and has been authorized for detection and/or diagnosis of SARS-CoV-2 by FDA under an Emergency Use Authorization (EUA).  This EUA will remain in effect (meaning this test can be used) for the duration of the COVID-19 declaration under Section 564(b)(1) of the Act, 21 U.S.C. section  360bbb-3(b)(1), unless the authorization is terminated or revoked sooner.  Performed at [facility], [place] [address] [place], [place] [address]      Discharge Instructions:   Discharge Instructions    Diet - low sodium heart healthy   Complete by: As directed    Fluid restriction 185m/day   Discharge instructions   Complete by: As directed    Follow-up with your primary care physician in 1 week.  Check blood work at that time.  Follow-up with cardiology in 1 to 2 weeks.  Cardiology office to contact you.  Medications have been changed.  Dose of torsemide has been increased.  Follow-up with nephrology as outpatient in 1 to 2 weeks.   Increase activity slowly   Complete by: As directed      Allergies as of 03/07/2020      Reactions   Benazepril Hcl Other (See Comments)   Unknown   Lotrel [amlodipine Besy-benazepril Hcl] Other (See Comments)   Hypotension   Talwin [pentazocine] Other (See Comments)   Hallucinations        Medication List    STOP taking these medications   amLODipine 10 MG tablet Commonly known as: NORVASC   sildenafil 20 MG tablet Commonly known as: REVATIO     TAKE these medications   Accu-Chek Aviva Plus test strip Generic drug: glucose blood USE  TO TEST BLOOD SUGAR ONE TIME DAILY What changed: See the new instructions.   Accu-Chek Softclix Lancets lancets USE  AS  INSTRUCTED What changed:   how much to take  how to take this  when to take this  reasons to take this  additional instructions   albuterol 108 (90 Base) MCG/ACT inhaler Commonly known as: VENTOLIN HFA Inhale 2 puffs into the lungs every 6 (six) hours as needed for wheezing or shortness of breath.   aspirin 81 MG tablet Take 81 mg by mouth at bedtime.   atorvastatin 40 MG tablet Commonly known as: LIPITOR TAKE 1 TABLET EVERY DAY   B-D SINGLE USE SWABS REGULAR Pads USE  TO TEST BLOOD SUGAR ONE TIME DAILY What changed: See the new instructions.   budesonide  0.5 MG/2ML nebulizer solution Commonly known as: PULMICORT  Take 2 mLs (0.5 mg total) by nebulization 2 (two) times daily.   carvedilol 3.125 MG tablet Commonly known as: COREG Take 1 tablet (3.125 mg total) by mouth 2 (two) times daily with a meal. What changed:   medication strength  See the new instructions.   cholecalciferol 25 MCG (1000 UNIT) tablet Commonly known as: VITAMIN D3 Take 1,000 Units by mouth daily.   ferrous sulfate 325 (65 FE) MG tablet Take 325 mg by mouth daily.   Combivent Respimat 20-100 MCG/ACT Aers respimat Generic drug: Ipratropium-Albuterol Inhale 1 puff into the lungs in the morning, at noon, and at bedtime. What changed: Another medication with the same name was changed. Make sure you understand how and when to take each.   ipratropium-albuterol 0.5-2.5 (3) MG/3ML Soln Commonly known as: DUONEB Take 3 mLs by nebulization every 6 (six) hours as needed. What changed: reasons to take this   Myrbetriq 50 MG Tb24 tablet Generic drug: mirabegron ER TAKE 1 TABLET EVERY DAY What changed:   how much to take  when to take this  reasons to take this   OXYGEN Inhale 2 L into the lungs continuous.   polyethylene glycol 17 g packet Commonly known as: MIRALAX / GLYCOLAX Take 17 g by mouth daily as needed for moderate constipation.   potassium chloride SA 20 MEQ tablet Commonly known as: KLOR-CON Take 1 tablet (20 mEq total) by mouth daily.   torsemide 20 MG tablet Commonly known as: DEMADEX Take 3 tablets (60 mg total) by mouth daily.            Durable Medical Equipment  (From admission, onward)         Start     Ordered   03/07/20 1306  For home use only DME oxygen  Once       Question Answer Comment  Length of Need Lifetime   Mode or (Route) Nasal cannula   Liters per Minute 2   Frequency Continuous (stationary and portable oxygen unit needed)   Oxygen conserving device Yes   Oxygen delivery system Gas      03/07/20 1305    03/07/20 1216  For home use only DME Other see comment  Once       Comments: Replacement cpap auto 4-20, mask of choice and supplies  Question:  Length of Need  Answer:  Lifetime   03/07/20 1216   03/07/20 1121  For home use only DME 3 n 1  Once        03/07/20 1120   03/07/20 1015  For home use only DME Nebulizer machine  Once       Question Answer Comment  Patient needs a nebulizer to treat with the following condition COPD (chronic obstructive pulmonary disease) ([place])   Length of Need Lifetime      03/07/20 1014          Follow-up Information    Henson, Vickie L, NP-C. Go on 03/13/2020.   Specialty: Family Medicine Why: Regular follow-up, blood work _0 :Holiday representative information: [place] [address] [phone]        Croitoru, Dani Gobble, MD Follow up in 2 week(s).   Specialty: Cardiology Why: office to call for appointment.  If you do not hear from the office in few days please call the office Contact information: [place] [address] [phone]        [facility] Follow up.   Why: cpap, home oxygen, neb machine,  3 n 1  Contact information: [place] [address] [phone]        [facility], [facility] Follow up.   Specialty: Home Health Services Why: HHRN,HHPT Contact information: [place] [address] [phone]                Time coordinating discharge: 39 minutes  Signed:  Hesper Venturella  [facility] 03/07/2020, 1:06 PM

## 2020-03-07 NOTE — TOC Initial Note (Signed)
Transition of Care [facility]) - Initial/Assessment Note    Patient Details  Name: [name] MRN: [id number] Date of Birth: 12-14-1940  Transition of [facility]) CM/SW Contact:    Zenon Mayo, RN Phone Number: 03/07/2020, 12:55 PM  Clinical Narrative:                 NCM spoke with patient and daughter at bedside, NCM offered choice for HHRN,HHPT, daughter states they would like [facility] but they could not take the referral so she states they do not have a preference, they were ok with [facility], NCM made referral to [facility] with [facility] for [place], he is able to take referral.  Soc will begin 24 to 48 hrs post dc.  Patient will need oxygen, neb machine, cpap and 3 n 1.  Referral made to [facility] with [facility]. He will bring 3 n 1 and neb machine to the room.  Respiratory will be scheduled to come to patient's home today or tomorrow per [facility] to set up cpap.  Per Gwinda Passe daughter will have to pay 940.00 for it today until they run thru insurance after sleep study and they will reimburse the daughter afterwards.   Daughter is [name] [phone]. Patient will be transported home via car by daughter after oxygen has been delivered to home.  Expected Discharge Plan: [place] Barriers to Discharge: No Barriers Identified   Patient Goals and CMS Choice Patient states their goals for this hospitalization and ongoing recovery are:: be independent CMS Medicare.gov Compare Post Acute Care list provided to:: Patient Represenative (must comment) Choice offered to / list presented to : Adult Children  Expected Discharge Plan and Services Expected Discharge Plan: [place] In-house Referral: Clinical Social Work Discharge Planning Services: CM Consult Post Acute Care Choice: Durable Medical Equipment, Home Health Living arrangements for the past 2 months: [place] Expected Discharge Date: 03/07/20               DME Arranged: Continuous passive motion machine,  Oxygen, 3-N-1, Nebulizer machine DME Agency: [employer] Date DME Agency Contacted: 03/07/20 Time DME Agency Contacted: [id number] Representative spoke with at DME Agency: Staci Acosta HH Arranged: RN, Disease Management, PT [place] Agency: [place] Date [place]: 03/07/20 Time [place]: 1000 Representative spoke with at [place]  Prior Living Arrangements/Services Living arrangements for the past 2 months: [place] with:: Self Patient language and need for interpreter reviewed:: Yes Do you feel safe going back to the place where you live?: Yes      Need for Family Participation in Patient Care: Yes (Comment) Care giver support system in place?: Yes (comment) Current home services: DME (oxygen, cpap) Criminal Activity/Legal Involvement Pertinent to Current Situation/Hospitalization: No - Comment as needed  Activities of Daily Living Home Assistive Devices/Equipment: Cane (specify quad or straight), CPAP, Walker (specify type), Eyeglasses ADL Screening (condition at time of admission) Patient's cognitive ability adequate to safely complete daily activities?: Yes Is the patient deaf or have difficulty hearing?: No Does the patient have difficulty seeing, even when wearing glasses/contacts?: No Does the patient have difficulty concentrating, remembering, or making decisions?: No Patient able to express need for assistance with ADLs?: Yes Does the patient have difficulty dressing or bathing?: No Independently performs ADLs?: Yes (appropriate for developmental age) Does the patient have difficulty walking or climbing stairs?: Yes Weakness of Legs: Both Weakness of Arms/Hands: None  Permission Sought/Granted Permission  sought to share information with : Family Supports Permission granted to share information with : Yes, Verbal Permission Granted  Share Information with NAME: Avaleigh Decuir [phone]     Permission granted to share  info w Relationship: Daughter  Permission granted to share info w Contact Information: [phone]  Emotional Assessment Appearance:: Appears stated age Attitude/Demeanor/Rapport: Engaged Affect (typically observed): Appropriate Orientation: : Oriented to Self, Oriented to Place, Oriented to  Time, Oriented to Situation Alcohol / Substance Use: Not Applicable Psych Involvement: No (comment)  Admission diagnosis:  Dyspnea on exertion [R06.00] AKI (acute kidney injury) ([place]) [N17.9] Acute on chronic systolic CHF (congestive heart failure) (HCC) [I50.23] Acute on chronic congestive heart failure, unspecified heart failure type ([place]) [I50.9] Patient Active Problem List   Diagnosis Date Noted  . Acute on chronic systolic CHF (congestive heart failure) ([place]) 03/04/2020  . Transient hypotension 03/04/2020  . Acute renal failure superimposed on stage 4 chronic kidney disease ([place]) 03/04/2020  . Pulmonary hypertension, unspecified ([place])   . Acute on chronic combined systolic and diastolic CHF (congestive heart failure) ([place]) 02/12/2020  . Acute on chronic respiratory failure with hypoxia ([place]) 02/12/2020  . CHF exacerbation ([place]) 02/12/2020  . D dimer value normal   . Diabetes mellitus type 2, diet-controlled ([place])   . Abnormal result of cardiovascular function study, unspecified 09/16/2018  . Callus 09/16/2018  . Claudication of both lower extremities ([place]) 09/16/2018  . Dependence on other enabling machines and devices 09/16/2018  . Dizziness 09/16/2018  . Dyspnea, unspecified 09/16/2018  . Medicare annual wellness visit, subsequent 09/16/2018  . Onychomycosis due to dermatophyte 09/16/2018  . Overgrown toenails 09/16/2018  . Other secondary pulmonary hypertension ([place]) 09/16/2018  . Iron deficiency anemia   . On home O2   . Benign neoplasm of colon   . PAH (pulmonary artery hypertension) ([place]) 09/09/2017  . PAT (paroxysmal atrial tachycardia) ([place]) 09/09/2017  .  Hypercholesterolemia 09/09/2017  . OSA on CPAP 09/08/2017  . 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 Type 2 diabetes, controlled, with peripheral neuropathy ([place])   . 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 Chronic combined systolic (congestive) and diastolic (congestive) heart failure ([place])   . CKD (chronic kidney disease) stage 4, GFR 15-29 ml/min (HCC)   . ICD (implantable cardioverter-defibrillator) in place   . Urge incontinence of urine   . Coronary artery disease involving native coronary artery of native heart without angina pectoris   . Chronic kidney disease 09/06/2017  . Restrictive lung disease 05/04/2017  . Body mass index (bmi) 33.0-33.9, adult 06/13/2014  . DEPRESSION 07/26/2007  . Essential hypertension 07/26/2007  . CARDIOMYOPATHY 07/26/2007  . COPD (chronic obstructive pulmonary disease) ([place]) 07/26/2007   PCP:  Girtha Rm, NP-C Pharmacy:   [facility] [place], [place] - [place] [facility] [place] [address] [place] [address] Phone: [phone] Fax: [facility] [place], [place] [address] Phone: [phone] Fax: [phone]     Social Determinants of Health (SDOH) Interventions Social Connections Interventions: Intervention Not Indicated Transportation Interventions: Intervention Not Indicated  Readmission Risk Interventions Readmission Risk Prevention Plan 03/07/2020  Transportation Screening Complete  PCP or Specialist Appt within 3-5 Days Complete  HRI or [place] Complete  Social Work Consult for [place] Planning/Counseling Complete  Palliative Care Screening Not Applicable  Medication Review Press photographer) Complete  Some recent data might be hidden

## 2020-03-07 NOTE — Plan of Care (Signed)
  Problem: Activity: Goal: Capacity to carry out activities will improve Outcome: Progressing   Problem: Coping: Goal: Ability to adjust to condition or change in health will improve Outcome: Progressing

## 2020-03-07 NOTE — Progress Notes (Signed)
Patient declined CPAP use at this time due to frequent visits to the bedside commode for upset stomach. Daughter at bedside and in agreement. Patient and family member aware to call for Respiratory if patient changes her mind.  Patient resting comfortably on 2L [place].

## 2020-03-07 NOTE — Evaluation (Signed)
Occupational Therapy Evaluation Patient Details Name: [name] MRN: [id number] DOB: 1940/08/13 Today's Date: 03/07/2020    History of Present Illness Pt is a 79 y.o. 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    Clinical Impression   Pt admitted with the above diagnoses and presents with below problem list. Pt will benefit from continued acute OT to address the below listed deficits and maximize independence with basic ADLs. PTA pt mod I to independent with ADLs Pt currently min guard with LB ADLs and shower transfers, supervision with functional mobility utilizing rw. Daughter present throughout session. Pt on 3L O2 throughout session.      Follow Up Recommendations  Home health OT;Supervision/Assistance - 24 hour    Equipment Recommendations  None recommended by OT    Recommendations for Other Services       Precautions / Restrictions Precautions Precautions: Other (comment) Precaution Comments: watch O2 Restrictions Weight Bearing Restrictions: No      Mobility Bed Mobility               General bed mobility comments: OOB in chair upon entry  Transfers Overall transfer level: Modified independent Equipment used: Rolling walker (2 wheeled) Transfers: Sit to/from Stand Sit to Stand: Supervision         General transfer comment: to/from recliner and comfort height toilet    Balance Overall balance assessment: Needs assistance Sitting-balance support: Feet supported Sitting balance-Leahy Scale: Good     Standing balance support: No upper extremity supported;During functional activity Standing balance-Leahy Scale: Fair Standing balance comment: relies on UE support for balance while walking. able to stnad at sink to wash hands                           ADL either  performed or assessed with clinical judgement   ADL Overall ADL's : Needs assistance/impaired Eating/Feeding: Set up;Sitting   Grooming: Min guard;Wash/dry hands;Standing   Upper Body Bathing: Set up;Sitting   Lower Body Bathing: Min guard;Sit to/from stand   Upper Body Dressing : Set up;Sitting   Lower Body Dressing: Min guard;Sit to/from stand   Toilet Transfer: Min guard;Ambulation;RW;Comfort height toilet;Grab bars   Toileting- Clothing Manipulation and Hygiene: Min guard;Sitting/lateral lean;Sit to/from stand;Supervision/safety;Set up   Tub/ Shower Transfer: Min guard;Ambulation;Rolling walker;Shower seat   Functional mobility during ADLs: Supervision/safety;Rolling walker General ADL Comments: Pt completed in room mobility, toilet transfer, pericare, and grooming task standing at sink. Energy conservation education provided for pt and daughter as related to ADLs and functional transfers/mobility     Vision Baseline Vision/History: Wears glasses       Perception     Praxis      Pertinent Vitals/Pain Pain Assessment: No/denies pain     Hand Dominance Right   Extremity/Trunk Assessment Upper Extremity Assessment Upper Extremity Assessment: Generalized weakness;Overall [facility] for tasks assessed   Lower Extremity Assessment Lower Extremity Assessment: Defer to PT evaluation       Communication Communication Communication: No difficulties   Cognition Arousal/Alertness: Awake/alert Behavior During Therapy: WFL for tasks assessed/performed Overall Cognitive Status: Within Functional Limits for tasks assessed                                     General Comments  On 3L O2 throughout session    Exercises     Shoulder Instructions      Home Living Family/patient expects to be discharged to:: Private residence Living Arrangements: Alone Available Help at Discharge: Family;Available PRN/intermittently Type of Home: Apartment Home Access: Level  entry     Home Layout: One level     Bathroom Shower/Tub: Teacher, early years/pre: Standard     Home Equipment: Environmental consultant - 4 wheels;Cane - single point;Shower seat;Bedside commode;Wheelchair - manual          Prior Functioning/Environment Level of Independence: Independent with assistive device(s)        Comments: Before previous admission, pt independent with use of Rollator vs SPC in community        OT Problem List: Decreased strength;Decreased activity tolerance;Impaired balance (sitting and/or standing);Decreased knowledge of use of DME or AE      OT Treatment/Interventions: Self-care/ADL training;Therapeutic exercise;Energy conservation;DME and/or AE instruction;Therapeutic activities;Patient/family education;Balance training    OT Goals(Current goals can be found in the care plan section) Acute Rehab OT Goals Patient Stated Goal: to return home OT Goal Formulation: With patient Time For Goal Achievement: 03/21/20 Potential to Achieve Goals: Good  OT Frequency: Min 2X/week   Barriers to D/C:            Co-evaluation              AM-PAC OT "6 Clicks" Daily Activity     Outcome Measure Help from another person eating meals?: None Help from another person taking care of personal grooming?: None Help from another person toileting, which includes using toliet, bedpan, or urinal?: A Little Help from another person bathing (including washing, rinsing, drying)?: A Little Help from another person to put on and taking off regular upper body clothing?: None Help from another person to put on and taking off regular lower body clothing?: A Little 6 Click Score: 21   End of Session Equipment Utilized During Treatment: Rolling walker;Oxygen  Activity Tolerance: Patient tolerated treatment well Patient left: in chair;with call bell/phone within reach;with family/visitor present  OT Visit Diagnosis: Unsteadiness on feet (R26.81);Muscle weakness  (generalized) (M62.81)                Time: 6468-0321 OT Time Calculation (min): 22 min Charges:  OT General Charges $OT Visit: 1 Visit OT Evaluation $OT Eval Low Complexity: [place], OT Acute Rehabilitation Services Pager: [phone] Office: [phone]   Hortencia Pilar 03/07/2020, 12:19 PM

## 2020-03-08 ENCOUNTER — Ambulatory Visit: Payer: Medicare PPO | Admitting: Gastroenterology

## 2020-03-08 ENCOUNTER — Telehealth: Payer: Self-pay | Admitting: Pulmonary Disease

## 2020-03-08 ENCOUNTER — Other Ambulatory Visit: Payer: Self-pay

## 2020-03-08 LAB — CUP PACEART REMOTE DEVICE CHECK
Battery Remaining Longevity: 31 mo
Battery Voltage: 2.94 V
Brady Statistic AP VP Percent: 0.65 %
Brady Statistic AP VS Percent: 96.46 %
Brady Statistic AS VP Percent: 0.02 %
Brady Statistic AS VS Percent: 2.87 %
Brady Statistic RA Percent Paced: 96.15 %
Brady Statistic RV Percent Paced: 0.77 %
Date Time Interrogation Session: 20210819172604
HighPow Impedance: 36 Ohm
HighPow Impedance: 53 Ohm
Implantable Pulse Generator Implant Date: 20131120
Lead Channel Impedance Value: 304 Ohm
Lead Channel Impedance Value: 323 Ohm
Lead Channel Impedance Value: 437 Ohm
Lead Channel Pacing Threshold Amplitude: 0.625 V
Lead Channel Pacing Threshold Amplitude: 0.75 V
Lead Channel Pacing Threshold Pulse Width: 0.4 ms
Lead Channel Pacing Threshold Pulse Width: 0.4 ms
Lead Channel Sensing Intrinsic Amplitude: 0.75 mV
Lead Channel Sensing Intrinsic Amplitude: 0.75 mV
Lead Channel Sensing Intrinsic Amplitude: 4.5 mV
Lead Channel Sensing Intrinsic Amplitude: 4.5 mV
Lead Channel Setting Pacing Amplitude: 1.75 V
Lead Channel Setting Pacing Amplitude: 2 V
Lead Channel Setting Pacing Pulse Width: 0.4 ms
Lead Channel Setting Sensing Sensitivity: 0.3 mV

## 2020-03-08 NOTE — Patient Outreach (Signed)
[place] [facility]) Care Management  03/08/2020  [name] 09-Oct-1940 [id number]   Referral Date: 03/07/20 Referral Source: Hospital Liaison Referral Reason: Post Hospitalization follow up (03-03-20 to 03-07-20 CHF)   Outreach Attempt: Call back to daughter [name] as requested.  No answer.  HIPAA compliant voice message left.   Plan: If no return call will attempt again within 4 business days and send letter.     [name], RN, MSN [facility] Care Management Care Management Coordinator Direct Line [phone] Toll Free: [phone]  Fax: [phone]

## 2020-03-08 NOTE — Telephone Encounter (Signed)
Called and spoke with daughter [name] per DPR she states that her mother was admitted into hospital on 02/11/20 and then went to rehab and then was readmitted into hospital on 03/03/20. States that while she was in the hospital she was taking Albuterol rescue inhaler, Pulmicort nebs, Duoneb nebs and Combivent and states they did not refill them for her so she does not have them.  Dr. Halford Chessman can you please advise if patient needs to continue on these medications and if so are you ok with [place] sending in prescriptions.  [facility] is [facility] [address]

## 2020-03-08 NOTE — Patient Outreach (Signed)
[place] [facility]) Care Management  03/08/2020  SHARAI OVERBAY 1941-04-24 [id number]   Telephone call to daughter [name] for hospital follow up to assess needs. Daughter asking for call back shortly as they are at an appointment.  Advised CM would attempt later today.  She verbalized understanding.  Plan: RN CM will attempt again later today.    [name], RN, MSN [place] Management Care Management Coordinator Direct Line [phone] Cell [phone] Toll Free: [phone]  Fax: [phone]

## 2020-03-08 NOTE — Progress Notes (Signed)
Remote ICD transmission.   

## 2020-03-09 DIAGNOSIS — I2721 Secondary pulmonary arterial hypertension: Secondary | ICD-10-CM | POA: Diagnosis not present

## 2020-03-09 DIAGNOSIS — J9621 Acute and chronic respiratory failure with hypoxia: Secondary | ICD-10-CM | POA: Diagnosis not present

## 2020-03-09 DIAGNOSIS — J449 Chronic obstructive pulmonary disease, unspecified: Secondary | ICD-10-CM | POA: Diagnosis not present

## 2020-03-09 DIAGNOSIS — I2510 Atherosclerotic heart disease of native coronary artery without angina pectoris: Secondary | ICD-10-CM | POA: Diagnosis not present

## 2020-03-09 DIAGNOSIS — D631 Anemia in chronic kidney disease: Secondary | ICD-10-CM | POA: Diagnosis not present

## 2020-03-09 DIAGNOSIS — I130 Hypertensive heart and chronic kidney disease with heart failure and stage 1 through stage 4 chronic kidney disease, or unspecified chronic kidney disease: Secondary | ICD-10-CM | POA: Diagnosis not present

## 2020-03-09 DIAGNOSIS — N184 Chronic kidney disease, stage 4 (severe): Secondary | ICD-10-CM | POA: Diagnosis not present

## 2020-03-09 DIAGNOSIS — E1122 Type 2 diabetes mellitus with diabetic chronic kidney disease: Secondary | ICD-10-CM | POA: Diagnosis not present

## 2020-03-09 DIAGNOSIS — I5023 Acute on chronic systolic (congestive) heart failure: Secondary | ICD-10-CM | POA: Diagnosis not present

## 2020-03-09 NOTE — Telephone Encounter (Signed)
Okay to send refill for pulmicort, duoneb, and combivent.  She doesn't need albuterol if she has duoneb and combivent, since both these already have albuterol in them.

## 2020-03-11 ENCOUNTER — Telehealth: Payer: Self-pay | Admitting: Family Medicine

## 2020-03-11 ENCOUNTER — Other Ambulatory Visit: Payer: Self-pay

## 2020-03-11 DIAGNOSIS — I2510 Atherosclerotic heart disease of native coronary artery without angina pectoris: Secondary | ICD-10-CM | POA: Diagnosis not present

## 2020-03-11 DIAGNOSIS — J449 Chronic obstructive pulmonary disease, unspecified: Secondary | ICD-10-CM | POA: Diagnosis not present

## 2020-03-11 DIAGNOSIS — I5023 Acute on chronic systolic (congestive) heart failure: Secondary | ICD-10-CM | POA: Diagnosis not present

## 2020-03-11 DIAGNOSIS — E1122 Type 2 diabetes mellitus with diabetic chronic kidney disease: Secondary | ICD-10-CM | POA: Diagnosis not present

## 2020-03-11 DIAGNOSIS — D631 Anemia in chronic kidney disease: Secondary | ICD-10-CM | POA: Diagnosis not present

## 2020-03-11 DIAGNOSIS — I2721 Secondary pulmonary arterial hypertension: Secondary | ICD-10-CM | POA: Diagnosis not present

## 2020-03-11 DIAGNOSIS — N184 Chronic kidney disease, stage 4 (severe): Secondary | ICD-10-CM | POA: Diagnosis not present

## 2020-03-11 DIAGNOSIS — I130 Hypertensive heart and chronic kidney disease with heart failure and stage 1 through stage 4 chronic kidney disease, or unspecified chronic kidney disease: Secondary | ICD-10-CM | POA: Diagnosis not present

## 2020-03-11 DIAGNOSIS — J9621 Acute and chronic respiratory failure with hypoxia: Secondary | ICD-10-CM | POA: Diagnosis not present

## 2020-03-11 MED ORDER — COMBIVENT RESPIMAT 20-100 MCG/ACT IN AERS: 1.0000 | INHALATION_SPRAY | Freq: Three times a day (TID) | RESPIRATORY_TRACT | 11 refills | Status: DC

## 2020-03-11 MED ORDER — BUDESONIDE 0.5 MG/2ML IN SUSP: 0.5000 mg | Freq: Two times a day (BID) | RESPIRATORY_TRACT | 11 refills | Status: DC

## 2020-03-11 MED ORDER — IPRATROPIUM-ALBUTEROL 0.5-2.5 (3) MG/3ML IN SOLN: 3.0000 mL | Freq: Four times a day (QID) | RESPIRATORY_TRACT | 11 refills | Status: DC | PRN

## 2020-03-11 NOTE — Telephone Encounter (Signed)
Left message for arnold to call me back

## 2020-03-11 NOTE — Telephone Encounter (Signed)
Called and spoke with patient's daughter to let her know that we were sending in 3 prescriptions to preferred pharmacy. Informed her that patient did not need albuterol since Duoneb and Combivent have it in there already. She expressed understanding. Nothing further needed at this time.

## 2020-03-11 NOTE — Patient Outreach (Addendum)
[name]) Care Management  [place]  03/11/2020   [name] [birth year] [id number]  Subjective: Telephone call to daughter [name].  Discussed reason for call and [facility] services. She is agreeable to services.  She also encourages calls to her mother as well. Patient recently in the hospital after being at [name].  Patient was diagnosed with heart failure but daughter [name] states that her mother has had heat failure since 2008. She states patient is watching her weight and they know signs of heart failure such as swelling, shortness or breath and fatigue.  They have no questions about discharge information and questions about inhalers has been answered by Pulmonary doctor.   Patient also with medical history significant of HTN, COPD, systolic CHF followed by Dr. [name], PAH, nonobstructive CAD, DM type II controlled, CKD stage IV followed by Dr. [name], and OSA on CPAP.  Patient lives alone but family is very active in her care and patient is able to get around her apartment without assistive devices.  However patient does use walker outside the home per daughter [name].   Spoke with patient after call with daughter [name]. Patient also agreeable to services. Patient is weighing and most recent weight was 153.4 lbs.  Discussed signs of heart failure with patient and when to notify physician. Patient has home health involved.  PT visited on Saturday and nurse is to come today.  Patient has follow ups scheduled with PCP, Cardiology, and pulmonary and family to transport.    Objective:   Encounter Medications:  Outpatient Encounter Medications as of 03/11/2020  Medication Sig  . ACCU-CHEK AVIVA PLUS test strip USE  TO TEST BLOOD SUGAR ONE TIME DAILY (Patient taking differently: 1 each by Other route as needed (To test blood sugar). )  . Accu-Chek Softclix Lancets lancets USE  AS  INSTRUCTED (Patient taking differently: 1 each by Other route as needed. )  . Alcohol  Swabs (B-D SINGLE USE SWABS REGULAR) PADS USE  TO TEST BLOOD SUGAR ONE TIME DAILY (Patient taking differently: 1 each by Other route as needed (Clean skin before blood sugar reading). )  . aspirin 81 MG tablet Take 81 mg by mouth at bedtime.   [name] atorvastatin (LIPITOR) 40 MG tablet TAKE 1 TABLET EVERY DAY (Patient taking differently: Take 40 mg by mouth daily. )  . budesonide (PULMICORT) 0.5 MG/2ML nebulizer solution Take 2 mLs (0.5 mg total) by nebulization 2 (two) times daily.  . carvedilol (COREG) 3.125 MG tablet Take 1 tablet (3.125 mg total) by mouth 2 (two) times daily with a meal.  . cholecalciferol (VITAMIN D3) 25 MCG (1000 UNIT) tablet Take 1,000 Units by mouth daily.  . ferrous sulfate 325 (65 FE) MG tablet Take 325 mg by mouth daily.   [name] ipratropium-albuterol (DUONEB) 0.5-2.5 (3) MG/3ML SOLN Take 3 mLs by nebulization every 6 (six) hours as needed (wheezing, SOB).  . MYRBETRIQ 50 MG TB24 tablet TAKE 1 TABLET EVERY DAY (Patient taking differently: Take 50 mg by mouth daily as needed (overactive bladder). )  . OXYGEN Inhale 2 L into the lungs continuous.   . polyethylene glycol (MIRALAX / GLYCOLAX) 17 g packet Take 17 g by mouth daily as needed for moderate constipation.  . potassium chloride SA (KLOR-CON) 20 MEQ tablet Take 1 tablet (20 mEq total) by mouth daily.  [name] torsemide (DEMADEX) 20 MG tablet Take 3 tablets (60 mg total) by mouth daily.  [name] albuterol (VENTOLIN HFA) 108 (90 Base) MCG/ACT inhaler Inhale  2 puffs into the lungs every 6 (six) hours as needed for wheezing or shortness of breath. (Patient not taking: Reported on 03/11/2020)  . Ipratropium-Albuterol (COMBIVENT RESPIMAT) 20-100 MCG/ACT AERS respimat Inhale 1 puff into the lungs in the morning, at noon, and at bedtime. (Patient not taking: Reported on 03/11/2020)   No facility-administered encounter medications on file as of 03/11/2020.    Functional Status:  In your present state of health, do you have any difficulty performing  the following activities: 03/11/2020 03/05/2020  Hearing? N N  Vision? N N  Difficulty concentrating or making decisions? N N  Walking or climbing stairs? Y Y  Comment needs assist -  Dressing or bathing? Y N  Comment family assist -  Doing errands, shopping? [name]  Comment family assists -  Conservation officer, nature and eating ? Y -  Using the Toilet? N -  In the past six months, have you accidently leaked urine? Y -  Comment incontinence -  Do you have problems with loss of bowel control? N -  Managing your Medications? Y -  Comment Family manages -  Managing your Finances? Y -  Comment family manages -  Housekeeping or managing your Housekeeping? Y -  Comment family helps -  Some recent data might be hidden    Fall/Depression Screening: Fall Risk  03/11/2020 10/04/2019 04/04/2019  Falls in the past year? 0 0 0  Number falls in past yr: - 0 0  Injury with Fall? - 0 0   PHQ 2/9 Scores 03/11/2020 10/04/2019 04/04/2019 01/27/2018 09/01/2017  PHQ - 2 Score 0 0 0 0 0    Assessment: Patient with recent hospitalization related to heart failure. Patient has family support and all follow ups are scheduled.   Plan:  [facility] CM Care Plan Problem One     Most Recent Value  Care Plan Problem One Kon[place]ledge deficit heart failure  Role Documenting the Problem One Care Management Telephonic Coordinator  Care Plan for Problem One Active  THN Long Term Goal  Patient will not experience unplanned hospital readmission within the next 31 days  THN Long Term Goal Start Date 03/11/20  Interventions for Problem One Long Term Goal Discussed with patient current clinical condition since hospital discharge, confirmed that patient has all medications and denies concerns around medications, reviewed post-hospital discharge instructions with patient and confirmed that patient has reliable transportation to all scheduled provider appointments post-hospital discharge  [facility] CM Short Term Goal #1  Patient will be abteto verbalize  signs of heart failure within 21 days.  THN CM Short Term Goal #1 Start Date 03/11/20  Interventions for Short Term Goal #1 Discussed with patient sgins fo heart failure such as 2-3 weight gain within a day, sweling, shortness of breath and fatigue.  THN CM Short Term Goal #2  Patient will follow up with PCP within the next 14 days.  THN CM Short Term Goal #2 Start Date 03/11/20  Interventions for Short Term Goal #2 Patient has appointment on 03-13-20.  Patient has transportation to appointments. Discussed importance of follow ups.     RN CM will provide ongoing education and support to patient through phone calls.   RN CM will send welcome packet with consent to patient.   RN CM will send initial barriers letter, assessment, and care plan to primary care physician.   RN CM will contact patient in the month of September and patient agrees to next contact.   Hadlee Burback Durwin Reges, RN, MSN [facility]  Care Management Care Management Coordinator Direct Line [phone] Cell [phone] Toll Free: [phone]  Fax: [phone]

## 2020-03-11 NOTE — Telephone Encounter (Signed)
Gave verbals orders

## 2020-03-11 NOTE — Telephone Encounter (Signed)
Ok to order therapy

## 2020-03-11 NOTE — Telephone Encounter (Signed)
Arnold with [facility] health called & said PT saw pt last Saturday 03/09/20 & needing verbal ok for frequency of 1 week 1 and 2 weeks 4 and 1 week 2. T# [phone]  Also pt does not have her inhalers and needs albuterol for nebulizer.  Michela Pitcher they are on her discharge list from hospital.  Daughter just called to get prescription filled.  Advised him they were refilled by pulmonary.  She has follow up appt with you Wednesday.  He needs verbal order

## 2020-03-12 DIAGNOSIS — I2510 Atherosclerotic heart disease of native coronary artery without angina pectoris: Secondary | ICD-10-CM | POA: Diagnosis not present

## 2020-03-12 DIAGNOSIS — I5023 Acute on chronic systolic (congestive) heart failure: Secondary | ICD-10-CM | POA: Diagnosis not present

## 2020-03-12 DIAGNOSIS — I130 Hypertensive heart and chronic kidney disease with heart failure and stage 1 through stage 4 chronic kidney disease, or unspecified chronic kidney disease: Secondary | ICD-10-CM | POA: Diagnosis not present

## 2020-03-12 DIAGNOSIS — J449 Chronic obstructive pulmonary disease, unspecified: Secondary | ICD-10-CM | POA: Diagnosis not present

## 2020-03-12 DIAGNOSIS — D631 Anemia in chronic kidney disease: Secondary | ICD-10-CM | POA: Diagnosis not present

## 2020-03-12 DIAGNOSIS — N184 Chronic kidney disease, stage 4 (severe): Secondary | ICD-10-CM | POA: Diagnosis not present

## 2020-03-12 DIAGNOSIS — E1122 Type 2 diabetes mellitus with diabetic chronic kidney disease: Secondary | ICD-10-CM | POA: Diagnosis not present

## 2020-03-12 DIAGNOSIS — I2721 Secondary pulmonary arterial hypertension: Secondary | ICD-10-CM | POA: Diagnosis not present

## 2020-03-12 DIAGNOSIS — J9621 Acute and chronic respiratory failure with hypoxia: Secondary | ICD-10-CM | POA: Diagnosis not present

## 2020-03-13 ENCOUNTER — Other Ambulatory Visit: Payer: Self-pay

## 2020-03-13 ENCOUNTER — Encounter: Payer: Self-pay | Admitting: Family Medicine

## 2020-03-13 ENCOUNTER — Other Ambulatory Visit: Payer: Self-pay | Admitting: Family Medicine

## 2020-03-13 ENCOUNTER — Encounter (HOSPITAL_BASED_OUTPATIENT_CLINIC_OR_DEPARTMENT_OTHER): Payer: Medicare PPO | Admitting: Pulmonary Disease

## 2020-03-13 ENCOUNTER — Ambulatory Visit: Payer: Medicare PPO | Admitting: Family Medicine

## 2020-03-13 VITALS — BP 140/78 | HR 62 | Wt 154.4 lb

## 2020-03-13 DIAGNOSIS — J449 Chronic obstructive pulmonary disease, unspecified: Secondary | ICD-10-CM

## 2020-03-13 DIAGNOSIS — I5042 Chronic combined systolic (congestive) and diastolic (congestive) heart failure: Secondary | ICD-10-CM | POA: Diagnosis not present

## 2020-03-13 DIAGNOSIS — G4733 Obstructive sleep apnea (adult) (pediatric): Secondary | ICD-10-CM

## 2020-03-13 DIAGNOSIS — Z09 Encounter for follow-up examination after completed treatment for conditions other than malignant neoplasm: Secondary | ICD-10-CM | POA: Diagnosis not present

## 2020-03-13 DIAGNOSIS — Z9989 Dependence on other enabling machines and devices: Secondary | ICD-10-CM | POA: Diagnosis not present

## 2020-03-13 DIAGNOSIS — N189 Chronic kidney disease, unspecified: Secondary | ICD-10-CM | POA: Diagnosis not present

## 2020-03-13 DIAGNOSIS — E119 Type 2 diabetes mellitus without complications: Secondary | ICD-10-CM

## 2020-03-13 DIAGNOSIS — Z9981 Dependence on supplemental oxygen: Secondary | ICD-10-CM

## 2020-03-13 MED ORDER — POTASSIUM CHLORIDE ER 10 MEQ PO TBCR: 20.0000 meq | EXTENDED_RELEASE_TABLET | Freq: Every day | ORAL | 0 refills | Status: DC

## 2020-03-13 NOTE — Progress Notes (Signed)
Subjective:    Patient ID: [name], female    DOB: 05-06-1941, 79 y.o.   MRN: [id number]  HPI  Chief Complaint  Patient presents with   hospital    hospital follow-up   She is here for hospital discharge follow up. She will also follow up with cardiology, nephrology and pulmonology.  Hospitalized from 03/03/20-03/07/2020 for acute on chronic CHF.   She had gained almost 30 lbs of weight at the time of her hospital admission.  States she is doing much better today. Her daughter is with her.   She is now using home oxygen 2 L continuous. She is using her CPAP but has a new sleep study later this month.   She has PT in home and a HHN.    Daughter requests K-dur 10 meq tablets instead of 20 meg.  She did not get a prescription for this upon discharge.   HTN-  BP at home 139/72 today.  She has a record of her pulse, BP, weight and oxygen sats.   COPD- Dr. Halford Chessman sent her nebulizers  Combivent is too expensive. She has not had nebs or inhalers since discharge and has not needed them.   DM- A1c 6.5% on 02/15/2020. Diet controlled.   CKD- renal function improved throughout hospital stay.   Denies fever, chills, dizziness, chest pain, palpitations, shortness of breath, abdominal pain, N/V/D, urinary symptoms, LE edema.   Reviewed allergies, medications, past medical, surgical, family, and social history.    Review of Systems Pertinent positives and negatives in the history of present illness.     Objective:   Physical Exam BP 140/78    Pulse 62    Wt 154 lb 6.4 oz (70 kg)    SpO2 99%    BMI 28.24 kg/m   Alert and in no distress. Oxygen [place] on under mask.  Cardiac exam shows a regular rate and rhythm.. Lungs are clear to auscultation, slightly diminished in bases. Extremities without edema. Normal speech, mood and thought process.        Assessment & Plan:  Hospital discharge follow-up - Plan: Magnesium, CBC with Differential/Platelet, Comprehensive metabolic  panel -here with her daughter for hospital f/u. Reports doing much better. Appears close to baseline. Reviewed discharge summaries, medications, labs and imaging. Closely followed by several specialists. I will check labs as recommended and follow up.   Chronic combined systolic (congestive) and diastolic (congestive) heart failure (HCC) - Plan: Magnesium, CBC with Differential/Platelet, Comprehensive metabolic panel -continue keeping a close eye on weight and intake/output. She is doing a great job with this. Follow up with cardiologist as scheduled.   OSA on CPAP -continue CPAP and oxygen. Follow with with pulmonology   On home O2 -continue monitoring oxygen sats as she has been. Doing well.   Diabetes mellitus type 2, diet-controlled (HCC) -A1c shows diabetes well controlled. No medications needed for DM  Chronic obstructive pulmonary disease, unspecified COPD type ([place]) - Plan: CBC with Differential/Platelet, Comprehensive metabolic panel -closely followed by Dr. Halford Chessman.   Chronic kidney disease, unspecified CKD stage - Plan: Magnesium, CBC with Differential/Platelet, Comprehensive metabolic panel -follow up with nephrologist. Check labs and follow up

## 2020-03-14 ENCOUNTER — Ambulatory Visit: Payer: Medicare PPO | Admitting: Gastroenterology

## 2020-03-14 LAB — CBC WITH DIFFERENTIAL/PLATELET
Basophils Absolute: 0 10*3/uL (ref 0.0–0.2)
Basos: 1 %
EOS (ABSOLUTE): 0.2 10*3/uL (ref 0.0–0.4)
Eos: 6 %
Hematocrit: 36.7 % (ref 34.0–46.6)
Hemoglobin: 11.6 g/dL (ref 11.1–15.9)
Immature Grans (Abs): 0 10*3/uL (ref 0.0–0.1)
Immature Granulocytes: 0 %
Lymphocytes Absolute: 0.7 10*3/uL (ref 0.7–3.1)
Lymphs: 22 %
MCH: 27.9 pg (ref 26.6–33.0)
MCHC: 31.6 g/dL (ref 31.5–35.7)
MCV: 88 fL (ref 79–97)
Monocytes Absolute: 0.5 10*3/uL (ref 0.1–0.9)
Monocytes: 14 %
Neutrophils Absolute: 1.9 10*3/uL (ref 1.4–7.0)
Neutrophils: 57 %
Platelets: 257 10*3/uL (ref 150–450)
RBC: 4.16 x10E6/uL (ref 3.77–5.28)
RDW: 15.2 % (ref 11.7–15.4)
WBC: 3.3 10*3/uL — ABNORMAL LOW (ref 3.4–10.8)

## 2020-03-14 LAB — COMPREHENSIVE METABOLIC PANEL
ALT: 12 IU/L (ref 0–32)
AST: 23 IU/L (ref 0–40)
Albumin/Globulin Ratio: 1.5 (ref 1.2–2.2)
Albumin: 4.6 g/dL (ref 3.6–4.6)
Alkaline Phosphatase: 118 IU/L (ref 48–121)
BUN/Creatinine Ratio: 26 (ref 12–28)
BUN: 63 mg/dL — ABNORMAL HIGH (ref 8–27)
Bilirubin Total: 1 mg/dL (ref 0.0–1.2)
CO2: 31 mmol/L — ABNORMAL HIGH (ref 20–29)
Calcium: 9.8 mg/dL (ref 8.7–10.3)
Chloride: 98 mmol/L (ref 96–106)
Creatinine, Ser: 2.38 mg/dL — ABNORMAL HIGH (ref 0.57–1.00)
GFR calc Af Amer: 21 mL/min/{1.73_m2} — ABNORMAL LOW (ref >59)
GFR calc non Af Amer: 19 mL/min/{1.73_m2} — ABNORMAL LOW (ref >59)
Globulin, Total: 3.1 g/dL (ref 1.5–4.5)
Glucose: 127 mg/dL — ABNORMAL HIGH (ref 65–99)
Potassium: 4.9 mmol/L (ref 3.5–5.2)
Sodium: 144 mmol/L (ref 134–144)
Total Protein: 7.7 g/dL (ref 6.0–8.5)

## 2020-03-14 LAB — MAGNESIUM: Magnesium: 2.8 mg/dL — ABNORMAL HIGH (ref 1.6–2.3)

## 2020-03-14 NOTE — Progress Notes (Signed)
Please fax her lab results to her nephrologist and put on cover page that her magnesium is elevated.

## 2020-03-15 DIAGNOSIS — D631 Anemia in chronic kidney disease: Secondary | ICD-10-CM | POA: Diagnosis not present

## 2020-03-15 DIAGNOSIS — E1122 Type 2 diabetes mellitus with diabetic chronic kidney disease: Secondary | ICD-10-CM | POA: Diagnosis not present

## 2020-03-15 DIAGNOSIS — J9621 Acute and chronic respiratory failure with hypoxia: Secondary | ICD-10-CM | POA: Diagnosis not present

## 2020-03-15 DIAGNOSIS — J449 Chronic obstructive pulmonary disease, unspecified: Secondary | ICD-10-CM | POA: Diagnosis not present

## 2020-03-15 DIAGNOSIS — I130 Hypertensive heart and chronic kidney disease with heart failure and stage 1 through stage 4 chronic kidney disease, or unspecified chronic kidney disease: Secondary | ICD-10-CM | POA: Diagnosis not present

## 2020-03-15 DIAGNOSIS — I2510 Atherosclerotic heart disease of native coronary artery without angina pectoris: Secondary | ICD-10-CM | POA: Diagnosis not present

## 2020-03-15 DIAGNOSIS — I5023 Acute on chronic systolic (congestive) heart failure: Secondary | ICD-10-CM | POA: Diagnosis not present

## 2020-03-15 DIAGNOSIS — I2721 Secondary pulmonary arterial hypertension: Secondary | ICD-10-CM | POA: Diagnosis not present

## 2020-03-15 DIAGNOSIS — N184 Chronic kidney disease, stage 4 (severe): Secondary | ICD-10-CM | POA: Diagnosis not present

## 2020-03-19 DIAGNOSIS — I5023 Acute on chronic systolic (congestive) heart failure: Secondary | ICD-10-CM | POA: Diagnosis not present

## 2020-03-19 DIAGNOSIS — J9621 Acute and chronic respiratory failure with hypoxia: Secondary | ICD-10-CM | POA: Diagnosis not present

## 2020-03-19 DIAGNOSIS — I2510 Atherosclerotic heart disease of native coronary artery without angina pectoris: Secondary | ICD-10-CM | POA: Diagnosis not present

## 2020-03-19 DIAGNOSIS — N184 Chronic kidney disease, stage 4 (severe): Secondary | ICD-10-CM | POA: Diagnosis not present

## 2020-03-19 DIAGNOSIS — E1122 Type 2 diabetes mellitus with diabetic chronic kidney disease: Secondary | ICD-10-CM | POA: Diagnosis not present

## 2020-03-19 DIAGNOSIS — I2721 Secondary pulmonary arterial hypertension: Secondary | ICD-10-CM | POA: Diagnosis not present

## 2020-03-19 DIAGNOSIS — I130 Hypertensive heart and chronic kidney disease with heart failure and stage 1 through stage 4 chronic kidney disease, or unspecified chronic kidney disease: Secondary | ICD-10-CM | POA: Diagnosis not present

## 2020-03-19 DIAGNOSIS — D631 Anemia in chronic kidney disease: Secondary | ICD-10-CM | POA: Diagnosis not present

## 2020-03-19 DIAGNOSIS — J449 Chronic obstructive pulmonary disease, unspecified: Secondary | ICD-10-CM | POA: Diagnosis not present

## 2020-03-21 DIAGNOSIS — I5023 Acute on chronic systolic (congestive) heart failure: Secondary | ICD-10-CM | POA: Diagnosis not present

## 2020-03-21 DIAGNOSIS — E1122 Type 2 diabetes mellitus with diabetic chronic kidney disease: Secondary | ICD-10-CM | POA: Diagnosis not present

## 2020-03-21 DIAGNOSIS — I2721 Secondary pulmonary arterial hypertension: Secondary | ICD-10-CM | POA: Diagnosis not present

## 2020-03-21 DIAGNOSIS — I130 Hypertensive heart and chronic kidney disease with heart failure and stage 1 through stage 4 chronic kidney disease, or unspecified chronic kidney disease: Secondary | ICD-10-CM | POA: Diagnosis not present

## 2020-03-21 DIAGNOSIS — D631 Anemia in chronic kidney disease: Secondary | ICD-10-CM | POA: Diagnosis not present

## 2020-03-21 DIAGNOSIS — N184 Chronic kidney disease, stage 4 (severe): Secondary | ICD-10-CM | POA: Diagnosis not present

## 2020-03-21 DIAGNOSIS — J449 Chronic obstructive pulmonary disease, unspecified: Secondary | ICD-10-CM | POA: Diagnosis not present

## 2020-03-21 DIAGNOSIS — I2510 Atherosclerotic heart disease of native coronary artery without angina pectoris: Secondary | ICD-10-CM | POA: Diagnosis not present

## 2020-03-21 DIAGNOSIS — J9621 Acute and chronic respiratory failure with hypoxia: Secondary | ICD-10-CM | POA: Diagnosis not present

## 2020-03-22 DIAGNOSIS — I5023 Acute on chronic systolic (congestive) heart failure: Secondary | ICD-10-CM | POA: Diagnosis not present

## 2020-03-22 DIAGNOSIS — E1122 Type 2 diabetes mellitus with diabetic chronic kidney disease: Secondary | ICD-10-CM | POA: Diagnosis not present

## 2020-03-22 DIAGNOSIS — I2721 Secondary pulmonary arterial hypertension: Secondary | ICD-10-CM | POA: Diagnosis not present

## 2020-03-22 DIAGNOSIS — J449 Chronic obstructive pulmonary disease, unspecified: Secondary | ICD-10-CM | POA: Diagnosis not present

## 2020-03-22 DIAGNOSIS — J9621 Acute and chronic respiratory failure with hypoxia: Secondary | ICD-10-CM | POA: Diagnosis not present

## 2020-03-22 DIAGNOSIS — D631 Anemia in chronic kidney disease: Secondary | ICD-10-CM | POA: Diagnosis not present

## 2020-03-22 DIAGNOSIS — I2510 Atherosclerotic heart disease of native coronary artery without angina pectoris: Secondary | ICD-10-CM | POA: Diagnosis not present

## 2020-03-22 DIAGNOSIS — N184 Chronic kidney disease, stage 4 (severe): Secondary | ICD-10-CM | POA: Diagnosis not present

## 2020-03-22 DIAGNOSIS — I130 Hypertensive heart and chronic kidney disease with heart failure and stage 1 through stage 4 chronic kidney disease, or unspecified chronic kidney disease: Secondary | ICD-10-CM | POA: Diagnosis not present

## 2020-03-26 ENCOUNTER — Other Ambulatory Visit: Payer: Self-pay

## 2020-03-26 DIAGNOSIS — I130 Hypertensive heart and chronic kidney disease with heart failure and stage 1 through stage 4 chronic kidney disease, or unspecified chronic kidney disease: Secondary | ICD-10-CM | POA: Diagnosis not present

## 2020-03-26 DIAGNOSIS — J449 Chronic obstructive pulmonary disease, unspecified: Secondary | ICD-10-CM | POA: Diagnosis not present

## 2020-03-26 DIAGNOSIS — I5023 Acute on chronic systolic (congestive) heart failure: Secondary | ICD-10-CM | POA: Diagnosis not present

## 2020-03-26 DIAGNOSIS — E1122 Type 2 diabetes mellitus with diabetic chronic kidney disease: Secondary | ICD-10-CM | POA: Diagnosis not present

## 2020-03-26 DIAGNOSIS — I2510 Atherosclerotic heart disease of native coronary artery without angina pectoris: Secondary | ICD-10-CM | POA: Diagnosis not present

## 2020-03-26 DIAGNOSIS — I2721 Secondary pulmonary arterial hypertension: Secondary | ICD-10-CM | POA: Diagnosis not present

## 2020-03-26 DIAGNOSIS — J9621 Acute and chronic respiratory failure with hypoxia: Secondary | ICD-10-CM | POA: Diagnosis not present

## 2020-03-26 DIAGNOSIS — N184 Chronic kidney disease, stage 4 (severe): Secondary | ICD-10-CM | POA: Diagnosis not present

## 2020-03-26 DIAGNOSIS — D631 Anemia in chronic kidney disease: Secondary | ICD-10-CM | POA: Diagnosis not present

## 2020-03-26 NOTE — Patient Outreach (Signed)
[place]) Care Management  [place]  03/26/2020   [name] [birth year] [id number]  Subjective: Telephone call to patient for follow up after hospitalization. Patient reports she is doing great. Patient current weight is 148 lbs.  Patient knows signs of heart failure and when to notify physician.  Patient continues to work with therapy and will see cardiologist on tomorrow for appointment.  She voices no concerns.    Objective:   Encounter Medications:  Outpatient Encounter Medications as of 03/26/2020  Medication Sig  . ACCU-CHEK AVIVA PLUS test strip USE  TO TEST BLOOD SUGAR ONE TIME DAILY (Patient taking differently: 1 each by Other route as needed (To test blood sugar). )  . Accu-Chek Softclix Lancets lancets USE  AS  INSTRUCTED (Patient taking differently: 1 each by Other route as needed. )  . albuterol (VENTOLIN HFA) 108 (90 Base) MCG/ACT inhaler Inhale 2 puffs into the lungs every 6 (six) hours as needed for wheezing or shortness of breath. (Patient not taking: Reported on 03/11/2020)  . Alcohol Swabs (B-D SINGLE USE SWABS REGULAR) PADS USE  TO TEST BLOOD SUGAR ONE TIME DAILY (Patient taking differently: 1 each by Other route as needed (Clean skin before blood sugar reading). )  . aspirin 81 MG tablet Take 81 mg by mouth at bedtime.   [name] atorvastatin (LIPITOR) 40 MG tablet TAKE 1 TABLET EVERY DAY (Patient taking differently: Take 40 mg by mouth daily. )  . budesonide (PULMICORT) 0.5 MG/2ML nebulizer solution Take 2 mLs (0.5 mg total) by nebulization 2 (two) times daily. (Patient not taking: Reported on 03/13/2020)  . carvedilol (COREG) 3.125 MG tablet Take 1 tablet (3.125 mg total) by mouth 2 (two) times daily with a meal.  . cholecalciferol (VITAMIN D3) 25 MCG (1000 UNIT) tablet Take 1,000 Units by mouth daily.  . ferrous sulfate 325 (65 FE) MG tablet Take 325 mg by mouth daily.   . Ipratropium-Albuterol (COMBIVENT RESPIMAT) 20-100 MCG/ACT AERS respimat  Inhale 1 puff into the lungs in the morning, at noon, and at bedtime. (Patient not taking: Reported on 03/13/2020)  . ipratropium-albuterol (DUONEB) 0.5-2.5 (3) MG/3ML SOLN Take 3 mLs by nebulization every 6 (six) hours as needed (wheezing, SOB). (Patient not taking: Reported on 03/13/2020)  . MYRBETRIQ 50 MG TB24 tablet TAKE 1 TABLET EVERY DAY (Patient taking differently: Take 50 mg by mouth daily as needed (overactive bladder). )  . OXYGEN Inhale 2 L into the lungs continuous.   . polyethylene glycol (MIRALAX / GLYCOLAX) 17 g packet Take 17 g by mouth daily as needed for moderate constipation.  . potassium chloride (KLOR-CON) 10 MEQ tablet TAKE 2 TABLETS(20 MEQ) BY MOUTH DAILY  . torsemide (DEMADEX) 20 MG tablet Take 3 tablets (60 mg total) by mouth daily.   No facility-administered encounter medications on file as of 03/26/2020.    Functional Status:  In your present state of health, do you have any difficulty performing the following activities: 03/11/2020 03/05/2020  Hearing? N N  Vision? N N  Difficulty concentrating or making decisions? N N  Walking or climbing stairs? Y Y  Comment needs assist -  Dressing or bathing? Y N  Comment family assist -  Doing errands, shopping? [name]  Comment family assists -  Conservation officer, nature and eating ? Y -  Using the Toilet? N -  In the past six months, have you accidently leaked urine? Y -  Comment incontinence -  Do you have problems with loss of  bowel control? N -  Managing your Medications? Y -  Comment Family manages -  Managing your Finances? Y -  Comment family manages -  Housekeeping or managing your Housekeeping? Y -  Comment family helps -  Some recent data might be hidden    Fall/Depression Screening: Fall Risk  03/11/2020 10/04/2019 04/04/2019  Falls in the past year? 0 0 0  Number falls in past yr: - 0 0  Injury with Fall? - 0 0   PHQ 2/9 Scores 03/11/2020 10/04/2019 04/04/2019 01/27/2018 09/01/2017  PHQ - 2 Score 0 0 0 0 0     Assessment: Patient continues post hospitalization recovery. Patient seeing physicians as scheduled.    Plan:  [facility] CM Care Plan Problem One     Most Recent Value  Care Plan Problem One Konwledge deficit heart failure  Role Documenting the Problem One Care Management Telephonic Coordinator  Care Plan for Problem One Active  [facility] Long Term Goal  Patient will not experience unplanned hospital readmission within the next 31 days  [facility]N Long Term Goal Start Date 03/11/20  Interventions for Problem One Long Term Goal Patient weighing daily, watching salt intake. REviewed with patient signs of acute heart failure and when to notify physician.    THN CM Short Term Goal #1  Patient will be abteto verbalize signs of heart failure within 21 days.  THN CM Short Term Goal #1 Start Date 03/11/20  Interventions for Short Term Goal #1 Reviewed with patient heart failure signs.  Patient denies swelling, shortness of breath or fatigue.    THN CM Short Term Goal #2  Patient will follow up with PCP within the next 14 days.  THN CM Short Term Goal #2 Start Date 03/11/20  [facility] CM Short Term Goal #2 Met Date 03/26/20     RN CM will outreach again in the month of September and patient agreeable.   [name], RN, MSN [facility] Care Management Coordinator Direct Line [phone] Cell [phone] Toll Free: [phone]  Fax: [phone]

## 2020-03-27 ENCOUNTER — Other Ambulatory Visit: Payer: Self-pay | Admitting: *Deleted

## 2020-03-27 ENCOUNTER — Ambulatory Visit (INDEPENDENT_AMBULATORY_CARE_PROVIDER_SITE_OTHER): Payer: Medicare PPO | Admitting: Cardiovascular Disease

## 2020-03-27 ENCOUNTER — Encounter: Payer: Self-pay | Admitting: Cardiovascular Disease

## 2020-03-27 ENCOUNTER — Other Ambulatory Visit: Payer: Self-pay

## 2020-03-27 ENCOUNTER — Encounter: Payer: Medicare PPO | Admitting: Cardiovascular Disease

## 2020-03-27 VITALS — BP 144/72 | HR 62 | Ht 63.0 in | Wt 156.0 lb

## 2020-03-27 DIAGNOSIS — I10 Essential (primary) hypertension: Secondary | ICD-10-CM

## 2020-03-27 DIAGNOSIS — I471 Supraventricular tachycardia: Secondary | ICD-10-CM | POA: Diagnosis not present

## 2020-03-27 DIAGNOSIS — E7800 Pure hypercholesterolemia, unspecified: Secondary | ICD-10-CM | POA: Diagnosis not present

## 2020-03-27 DIAGNOSIS — E1142 Type 2 diabetes mellitus with diabetic polyneuropathy: Secondary | ICD-10-CM | POA: Diagnosis not present

## 2020-03-27 DIAGNOSIS — I5022 Chronic systolic (congestive) heart failure: Secondary | ICD-10-CM

## 2020-03-27 DIAGNOSIS — I2510 Atherosclerotic heart disease of native coronary artery without angina pectoris: Secondary | ICD-10-CM | POA: Diagnosis not present

## 2020-03-27 DIAGNOSIS — N184 Chronic kidney disease, stage 4 (severe): Secondary | ICD-10-CM

## 2020-03-27 DIAGNOSIS — Z95810 Presence of automatic (implantable) cardiac defibrillator: Secondary | ICD-10-CM

## 2020-03-27 DIAGNOSIS — I2721 Secondary pulmonary arterial hypertension: Secondary | ICD-10-CM

## 2020-03-27 DIAGNOSIS — G4733 Obstructive sleep apnea (adult) (pediatric): Secondary | ICD-10-CM

## 2020-03-27 DIAGNOSIS — I4719 Other supraventricular tachycardia: Secondary | ICD-10-CM

## 2020-03-27 NOTE — Patient Instructions (Signed)
Medication Instructions:  No changes *If you need a refill on your cardiac medications before your next appointment, please call your pharmacy*   Lab Work: None ordered If you have labs (blood work) drawn today and your tests are completely normal, you will receive your results only by: [name] MyChart Message (if you have MyChart) OR . A paper copy in the mail If you have any lab test that is abnormal or we need to change your treatment, we will call you to review the results.   Testing/Procedures: None ordered   Follow-Up: At [facility], you and your health needs are our priority.  As part of our continuing mission to provide you with exceptional heart care, we have created designated Provider Care Teams.  These Care Teams include your primary Cardiologist (physician) and Advanced Practice Providers (APPs -  Physician Assistants and Nurse Practitioners) who all work together to provide you with the care you need, when you need it.  We recommend signing up for the patient portal called "MyChart".  Sign up information is provided on this After Visit Summary.  MyChart is used to connect with patients for Virtual Visits (Telemedicine).  Patients are able to view lab/test results, encounter notes, upcoming appointments, etc.  Non-urgent messages can be sent to your provider as well.   To learn more about what you can do with MyChart, go to [email or web address].    Your next appointment:   3 month(s)  The format for your next appointment:   In Person  Provider:   Sanda Klein, MD

## 2020-03-27 NOTE — Progress Notes (Signed)
Cardiology Office Note:    Date:  03/27/2020   ID:  [name], DOB 30-May-1941, MRN [id number]  PCP:  Girtha Rm, NP-C  Cardiologist:  Sanda Klein, MD   Referring MD: Girtha Rm, NP-C   Chief complaint: CHF, ICD  History of Present Illness:    [name] is a 79 y.o. female with a hx of nonischemic cardiomyopathy (previous EF 35%, most recent EF 45-50%), minor nonobstructive coronary artery disease (last left heart cath December 2015), status post defibrillator (Medtronic [place] dual-chamber, implanted 2013, Sprint [place] 6949-lead under advisory), severe pulmonary hypertension, obstructive sleep apnea, restrictive lung disease, essential hypertension, hypercholesterolemia type 2 diabetes mellitus, which is currently diet controlled.  She was admitted for congestive heart failure with gradual onset of hypervolemia in July (possibly related to inability to comply with CPAP due to defective equipment). She had marked hypoxemia, requiring 5-6 L/minute oxygen for quite a while despite aggressive diuresis. She underwent right heart catheterization with the following findings.  Findings:  RA = 20 RV = 72/27 PA = 79/44 (53) PCW = 18 Fick cardiac output/index = 5.35/2.9 Thermo CO/CI = 4.4/2.4 PVR = 6.5 (Fick) 8.0 (Thermo) Ao sat = 94% PA sat = 64%, 64% SVC sat = 61%  Assessment: 1. Severe PAH with normal left-sided pressures  Plan/Discussion:  There is near equalization or diastolic pressures which can reflect restrictive physiology or elevated R-sided pressure with normal left pressures. Given relatively normal PCWP - restriction felt to be less likely.   Treatment of PDE 5 inhibitors was initiated and seems to be well-tolerated and allowed additional diuresis. She was discharged to a rehab facility where she quickly reaccumulated fluid and rehospitalized. She responded to repeat treatment with diuretics. It was suspected that the sildenafil may have  contributed to worsening left heart failure and this medication was then discontinued.  She is back down to the oxygen at 2 L by nasal cannula and does not appear to need to use this all the time. About a week ago she appeared to be excessively diuresed with a weight of 140 pounds and worsening renal parameters. After reduction of the diuretic her weight today is back up to 152 pounds on her home scale. She does not have any lower extremity edema today. Her lungs were clear. NYHA functional class II exertional dyspnea. Repeatedly, it appears that the optimal "dry weight" is around 150 pounds on her home scale (about 4 lbs higher on office scale).  She has not had any problems with chest discomfort, leg edema, palpitations, orthopnea or PND. She denies dizziness or syncope.  Device download on 820/2021 shows normal ICD function shows no recent episodes of high ventricular rate or atrial mode switch. She had an episode of paroxysmal atrial tachycardia or possibly slow atrial flutter at about 200 bpm/ventricular rate 80 bpm on May 2.  It lasted for about an hour and was asymptomatic.    High resolution chest CT repeated in July 2021 did not show evidence of fibrotic lung disease. Nevertheless, the PFTs are currently showing restrictive lung disease with FVC around 65% of predicted and, certain reduction in FEV1  Her most recent lipid parameters were excellent and her hemoglobin A1c was 6.5%, without medicines for diabetes. Her creatinine has varied recently with varying degrees of diuresis. On 8/25 creatinine was 2.38, while her baseline appears to be around 2.0. Her nephrologist is Dr. [name]. (Creatinine was as high as 3.42 during aggressive diuresis).)  Her last cardiac catheterization was  in December 2015 and described "mild plaquing and no obstructive CAD" in the left main and circumflex coronary artery, while the LAD and RCA were described as normal.  At that time the EF was 35-45% by  ventriculography in the left ventricular end-diastolic pressure was 30 mmHg.  The mean pulmonary artery pressure was 25 mmHg.  The pulmonary artery pressure was 90/23 mmHg (with systemic blood pressure 168/66).  Carotid duplex ultrasound performed in October 2017 showed minor plaque in both carotids and antegrade flow in both vertebral arteries.  Her device shows occasional relatively brief episodes of atrial mode switch, most commonly paroxysmal atrial tachycardia or slow atrial flutter with varying degrees of AV block. High ventricular rates are not seen. The longest episode lasted for about an hour and occurred in May 2021. Lead parameters are normal.  As mentioned her ventricular lead is a Sprint Fidelis [id number] under advisory but shows no evidence of SIC.  Her cardiologist in [place] for many years was Dr. Elberta Spaniel, most recently Dr. Barnie Alderman  She bears a diagnosis of restrictive lung disease and obstructive sleep apnea.  She is supposed to be on home oxygen.  She uses CPAP for severe obstructive sleep apnea with an apnea hypotony index of 59 and desaturations down to 80%.  Despite treatment with CPAP it has been very difficult to control her AHI.  She plans to follow-up with Dr. Halford Chessman or with Dr. El[place] Soho in the pulmonary clinic.  The report stated that prior PFTs showed no obstruction but fairly significant restriction and reduced DLCO but repeat PFTs today were improved, CT chest was unremarkable and the feeling was that her initial PFTs may have not been accurate.  Most recent hemoglobin A1c is documented as being 6.1%, on glimepiride monotherapy.  She is not on statin therapy, but her previous notes report that she was taking atorvastatin 40 mg daily and her LDL was 36.[name]  Her baseline creatinine seems to be 1.6-1.9.  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  She had a pulmonary embolism 40 years ago.  EGD performed in 2011 showed mild erosive esophagitis and  gastritis.  She was prescribed Protonix.  She has a remote history of depression for which she is not currently receiving any medications.  She has never smoked and denies use of alcohol.     Past Medical History:  Diagnosis Date  . Anemia   . CAD (coronary artery disease)   . Cardiac defibrillator in situ   . Cataract    bilateral 2017  . CHF (congestive heart failure) ([employer])   . Chronic systolic heart failure ([place])   . CKD (chronic kidney disease) stage 4, GFR 15-29 ml/min (HCC)   . COPD (chronic obstructive pulmonary disease) ([place])   . Dyspnea   . HTN (hypertension)   . Malignant neoplasm of rectum ([place]) 09/08/2017   Dr. Rollene Rotunda in [place]. Per notes she was due for colonoscopy in 12/2016  . OSA on CPAP 09/08/2017  . Other cardiomyopathies ([place])   . Oxygen deficiency    2 liters at night  . Pulmonary HTN ([place])   . Sleep apnea    uses c-pap  . Stroke ([place])   . Type 2 diabetes, controlled, with peripheral neuropathy ([place])   . Urge incontinence of urine     Past Surgical History:  Procedure Laterality Date  . BIOPSY  03/29/2018   Procedure: BIOPSY;  Surgeon: Yetta Flock, MD;  Location: [facility];  Service: Gastroenterology;;  .  COLONOSCOPY    . COLONOSCOPY WITH PROPOFOL N/A 03/29/2018   Procedure: COLONOSCOPY WITH PROPOFOL;  Surgeon: Yetta Flock, MD;  Location: [facility];  Service: Gastroenterology;  Laterality: N/A;  . ESOPHAGOGASTRODUODENOSCOPY (EGD) WITH PROPOFOL N/A 03/29/2018   Procedure: ESOPHAGOGASTRODUODENOSCOPY (EGD) WITH PROPOFOL;  Surgeon: Yetta Flock, MD;  Location: [facility];  Service: Gastroenterology;  Laterality: N/A;  . POLYPECTOMY  03/29/2018   Procedure: POLYPECTOMY;  Surgeon: Yetta Flock, MD;  Location: [facility];  Service: Gastroenterology;;  . RIGHT HEART CATH N/A 02/15/2020   Procedure: RIGHT HEART CATH;  Surgeon: Jolaine Artist, MD;  Location: [facility];  Service: Cardiovascular;  Laterality: N/A;   . TONSILLECTOMY AND ADENOIDECTOMY     age 26  . TUBAL LIGATION     years ago  . UPPER GASTROINTESTINAL ENDOSCOPY    . UPPER GI ENDOSCOPY  11/28/2009   mild erosive esophagitis, mild erosive changes in stomach, chronic constipation    Current Medications: Current Meds  Medication Sig  . ACCU-CHEK AVIVA PLUS test strip USE  TO TEST BLOOD SUGAR ONE TIME DAILY (Patient taking differently: 1 each by Other route as needed (To test blood sugar). )  . Accu-Chek Softclix Lancets lancets USE  AS  INSTRUCTED (Patient taking differently: 1 each by Other route as needed. )  . Alcohol Swabs (B-D SINGLE USE SWABS REGULAR) PADS USE  TO TEST BLOOD SUGAR ONE TIME DAILY (Patient taking differently: 1 each by Other route as needed (Clean skin before blood sugar reading). )  . aspirin 81 MG tablet Take 81 mg by mouth at bedtime.   [name] atorvastatin (LIPITOR) 40 MG tablet TAKE 1 TABLET EVERY DAY (Patient taking differently: Take 40 mg by mouth daily. )  . budesonide (PULMICORT) 0.5 MG/2ML nebulizer solution Take 2 mLs (0.5 mg total) by nebulization 2 (two) times daily. (Patient taking differently: Take 0.5 mg by nebulization daily. )  . carvedilol (COREG) 3.125 MG tablet Take 1 tablet (3.125 mg total) by mouth 2 (two) times daily with a meal.  . cholecalciferol (VITAMIN D3) 25 MCG (1000 UNIT) tablet Take 1,000 Units by mouth daily.  . ferrous sulfate 325 (65 FE) MG tablet Take 325 mg by mouth daily.   [name] ipratropium-albuterol (DUONEB) 0.5-2.5 (3) MG/3ML SOLN Take 3 mLs by nebulization every 6 (six) hours as needed (wheezing, SOB).  . MYRBETRIQ 50 MG TB24 tablet TAKE 1 TABLET EVERY DAY (Patient taking differently: Take 50 mg by mouth daily as needed (overactive bladder). )  . OXYGEN Inhale 2 L into the lungs continuous.   . polyethylene glycol (MIRALAX / GLYCOLAX) 17 g packet Take 17 g by mouth daily as needed for moderate constipation.  . potassium chloride (KLOR-CON) 10 MEQ tablet TAKE 2 TABLETS(20 MEQ) BY MOUTH  DAILY  . torsemide (DEMADEX) 20 MG tablet Take 3 tablets (60 mg total) by mouth daily.  . [DISCONTINUED] albuterol (VENTOLIN HFA) 108 (90 Base) MCG/ACT inhaler Inhale 2 puffs into the lungs every 6 (six) hours as needed for wheezing or shortness of breath.     Allergies:   Benazepril hcl, Lotrel [amlodipine besy-benazepril hcl], and Talwin [pentazocine]   Social History   Socioeconomic History  . Marital status: Widowed    Spouse name: Not on file  . Number of children: Not on file  . Years of education: Not on file  . Highest education level: Not on file  Occupational History  . Not on file  Tobacco Use  . Smoking status: Former  Smoker    Quit date: 03/14/2009    Years since quitting: 11.0  . Smokeless tobacco: Never Used  . Tobacco comment: quit smoking 2010  Vaping Use  . Vaping Use: Never used  Substance and Sexual Activity  . Alcohol use: No  . Drug use: No  . Sexual activity: Not on file  Other Topics Concern  . Not on file  Social History Narrative   Lives alone.  2 daughters and 1 granddaughter in [place]   Social Determinants of Health   Financial Resource Strain:   . Difficulty of Paying Living Expenses: Not on file  Food Insecurity: No Food Insecurity  . Worried About Charity fundraiser in the Last Year: Never true  . Ran Out of Food in the Last Year: Never true  Transportation Needs: No Transportation Needs  . Lack of Transportation (Medical): No  . Lack of Transportation (Non-Medical): No  Physical Activity:   . Days of Exercise per Week: Not on file  . Minutes of Exercise per Session: Not on file  Stress:   . Feeling of Stress : Not on file  Social Connections: Moderately Integrated  . Frequency of Communication with Friends and Family: More than three times a week  . Frequency of Social Gatherings with Friends and Family: More than three times a week  . Attends Religious Services: More than 4 times per year  . Active Member of Clubs or Organizations:  Yes  . Attends Archivist Meetings: 1 to 4 times per year  . Marital Status: Widowed     Family History: The patient's family history is significant for a history of coronary artery disease at advanced ages.  And stroke at advanced ages ROS:   Please see the history of present illness.    All other systems are reviewed and are negative.   EKGs/Labs/Other Studies Reviewed:    The following studies were reviewed today: Comprehensive ICD check.  EKG:  EKG is ordered today. Atrial paced, ventricular sensed rhythm, long AV delay 224 ms, QTc 493 ms. Recent Labs: 04/04/2019: TSH 2.020 03/04/2020: B Natriuretic Peptide 466.5 03/13/2020: ALT 12; BUN 63; Creatinine, Ser 2.38; Hemoglobin 11.6; Magnesium 2.8; Platelets 257; Potassium 4.9; Sodium 144  Recent Lipid Panel    Component Value Date/Time   CHOL 159 10/04/2019 1444   TRIG 62 10/04/2019 1444   HDL 109 10/04/2019 1444   CHOLHDL 1.5 10/04/2019 1444   CHOLHDL 2.3 06/25/2007 0700   VLDL 11 06/25/2007 0700   LDLCALC 38 10/04/2019 1444    Physical Exam:    VS:  BP (!) 144/72   Pulse 62   Ht _0  (1.6 m)   Wt 156 lb (70.8 kg)   BMI 27.63 kg/m     Wt Readings from Last 3 Encounters:  03/27/20 156 lb (70.8 kg)  03/13/20 154 lb 6.4 oz (70 kg)  03/07/20 168 lb 6.4 oz (76.4 kg)      General: Alert, oriented x3, no distress, healthy left subclavian pacemaker site Head: no evidence of trauma, PERRL, EOMI, no exophtalmos or lid lag, no myxedema, no xanthelasma; normal ears, nose and oropharynx Neck: normal jugular venous pulsations and no hepatojugular reflux; brisk carotid pulses without delay and no carotid bruits Chest: clear to auscultation, no signs of consolidation by percussion or palpation, normal fremitus, symmetrical and full respiratory excursions Cardiovascular: normal position and quality of the apical impulse, regular rhythm, normal first and intensified second heart sounds, no murmurs, rubs or  gallops  Abdomen: no tenderness or distention, no masses by palpation, no abnormal pulsatility or arterial bruits, normal bowel sounds, no hepatosplenomegaly Extremities: no clubbing, cyanosis or edema; 2+ radial, ulnar and brachial pulses bilaterally; 2+ right femoral, posterior tibial and dorsalis pedis pulses; 2+ left femoral, posterior tibial and dorsalis pedis pulses; no subclavian or femoral bruits Neurological: grossly nonfocal Psych: Normal mood and affect     ASSESSMENT:    1. Chronic systolic heart failure ([place])   2. PAH (pulmonary artery hypertension) ([place])   3. ICD (implantable cardioverter-defibrillator) in place   4. PAT (paroxysmal atrial tachycardia) ([place])   5. Essential hypertension   6. CKD (chronic kidney disease) stage 4, GFR 15-29 ml/min (HCC)   7. Type 2 diabetes, controlled, with peripheral neuropathy ([place])   8. Hypercholesterolemia   9. Coronary artery disease involving native coronary artery of native heart without angina pectoris   10. OSA (obstructive sleep apnea)    PLAN:    In order of problems listed above:  1. CHF: Back to NYHA functional class II.  Her optimal dry weight" appears to be 150 pounds, 2 pounds.  Her daughter [name] is doing an excellent job adjusting her diuretic dose (now on torsemide dosed intermittently) and potassium supplement to achieve that goal.  She has primarily diastolic dysfunction with LVEF most recently estimated be 45-50%. 2. PAH: This is severe and largely independent of the left heart failure.  The mean PA pressure was 53 mmHg when the mean wedge pressure was 18 mmHg at the recent right heart catheterization (transpulmonary gradient 35 mmHg).  It is likely related to longstanding undertreated sleep disordered breathing.  There is no evidence of significant fibrotic lung disease on high-resolution CT, although there is some evidence of restriction by pulmonary function tests  3. ICD: Normal device function by recent download  August 20.  Her RV lead is under advisory, but we are not planning lead revision unless it is dysfunctional.  She does not have ventricular pacing.   She has never received tachycardia therapies from her defibrillator.  Continue remote downloads every 3 months and yearly office visits. 4. PAT: Sustained episode in May lasting for about an hour, well rate controlled and asymptomatic.  On carvedilol.  True atrial fibrillation has not been reported. 5. HTN: Excellent control.  Outstanding HDL of 109 6. CKD 4: Baseline creatinine now seems to be around 2.0, recent acute worsening with aggressive diuresis.  Recheck periodically. 7. DM: Diet controlled.  Weight loss has really helped. 8. HLP: Excellent lipid profile on current medications. 9. CAD: She does not have angina pectoris..  Minor plaque at her last cardiac catheterization and minor carotid plaque by ultrasound.  On low-dose aspirin. 10. OSA: Meticulous compliance with CPAP and aggressive compensation of any episodes of hypoxemia with supplemental oxygen is the most important intervention to prevent worsening of her pulmonary hypertension and heart failure.    Medication Adjustments/Labs and Tests Ordered: Patient Current medicines are reviewed at length with the patient today.  Concerns regarding medicines are outlined above.  Orders Placed This Encounter  Procedures  . EKG 12-Lead   No orders of the defined types were placed in this encounter.   Signed, Sanda Klein, MD  03/27/2020 10:51 AM    [place]

## 2020-03-28 DIAGNOSIS — I5023 Acute on chronic systolic (congestive) heart failure: Secondary | ICD-10-CM | POA: Diagnosis not present

## 2020-03-28 DIAGNOSIS — E1122 Type 2 diabetes mellitus with diabetic chronic kidney disease: Secondary | ICD-10-CM | POA: Diagnosis not present

## 2020-03-28 DIAGNOSIS — J449 Chronic obstructive pulmonary disease, unspecified: Secondary | ICD-10-CM | POA: Diagnosis not present

## 2020-03-28 DIAGNOSIS — J9621 Acute and chronic respiratory failure with hypoxia: Secondary | ICD-10-CM | POA: Diagnosis not present

## 2020-03-28 DIAGNOSIS — D631 Anemia in chronic kidney disease: Secondary | ICD-10-CM | POA: Diagnosis not present

## 2020-03-28 DIAGNOSIS — I2721 Secondary pulmonary arterial hypertension: Secondary | ICD-10-CM | POA: Diagnosis not present

## 2020-03-28 DIAGNOSIS — I2510 Atherosclerotic heart disease of native coronary artery without angina pectoris: Secondary | ICD-10-CM | POA: Diagnosis not present

## 2020-03-28 DIAGNOSIS — N184 Chronic kidney disease, stage 4 (severe): Secondary | ICD-10-CM | POA: Diagnosis not present

## 2020-03-28 DIAGNOSIS — I130 Hypertensive heart and chronic kidney disease with heart failure and stage 1 through stage 4 chronic kidney disease, or unspecified chronic kidney disease: Secondary | ICD-10-CM | POA: Diagnosis not present

## 2020-03-29 DIAGNOSIS — J9621 Acute and chronic respiratory failure with hypoxia: Secondary | ICD-10-CM | POA: Diagnosis not present

## 2020-03-29 DIAGNOSIS — E1122 Type 2 diabetes mellitus with diabetic chronic kidney disease: Secondary | ICD-10-CM | POA: Diagnosis not present

## 2020-03-29 DIAGNOSIS — I2510 Atherosclerotic heart disease of native coronary artery without angina pectoris: Secondary | ICD-10-CM | POA: Diagnosis not present

## 2020-03-29 DIAGNOSIS — I2721 Secondary pulmonary arterial hypertension: Secondary | ICD-10-CM | POA: Diagnosis not present

## 2020-03-29 DIAGNOSIS — I5023 Acute on chronic systolic (congestive) heart failure: Secondary | ICD-10-CM | POA: Diagnosis not present

## 2020-03-29 DIAGNOSIS — J449 Chronic obstructive pulmonary disease, unspecified: Secondary | ICD-10-CM | POA: Diagnosis not present

## 2020-03-29 DIAGNOSIS — N184 Chronic kidney disease, stage 4 (severe): Secondary | ICD-10-CM | POA: Diagnosis not present

## 2020-03-29 DIAGNOSIS — I130 Hypertensive heart and chronic kidney disease with heart failure and stage 1 through stage 4 chronic kidney disease, or unspecified chronic kidney disease: Secondary | ICD-10-CM | POA: Diagnosis not present

## 2020-03-29 DIAGNOSIS — D631 Anemia in chronic kidney disease: Secondary | ICD-10-CM | POA: Diagnosis not present

## 2020-04-02 DIAGNOSIS — I2510 Atherosclerotic heart disease of native coronary artery without angina pectoris: Secondary | ICD-10-CM | POA: Diagnosis not present

## 2020-04-02 DIAGNOSIS — I5023 Acute on chronic systolic (congestive) heart failure: Secondary | ICD-10-CM | POA: Diagnosis not present

## 2020-04-02 DIAGNOSIS — N184 Chronic kidney disease, stage 4 (severe): Secondary | ICD-10-CM | POA: Diagnosis not present

## 2020-04-02 DIAGNOSIS — J9621 Acute and chronic respiratory failure with hypoxia: Secondary | ICD-10-CM | POA: Diagnosis not present

## 2020-04-02 DIAGNOSIS — J449 Chronic obstructive pulmonary disease, unspecified: Secondary | ICD-10-CM | POA: Diagnosis not present

## 2020-04-02 DIAGNOSIS — E1122 Type 2 diabetes mellitus with diabetic chronic kidney disease: Secondary | ICD-10-CM | POA: Diagnosis not present

## 2020-04-02 DIAGNOSIS — I2721 Secondary pulmonary arterial hypertension: Secondary | ICD-10-CM | POA: Diagnosis not present

## 2020-04-02 DIAGNOSIS — D631 Anemia in chronic kidney disease: Secondary | ICD-10-CM | POA: Diagnosis not present

## 2020-04-02 DIAGNOSIS — I130 Hypertensive heart and chronic kidney disease with heart failure and stage 1 through stage 4 chronic kidney disease, or unspecified chronic kidney disease: Secondary | ICD-10-CM | POA: Diagnosis not present

## 2020-04-05 ENCOUNTER — Other Ambulatory Visit: Payer: Self-pay

## 2020-04-05 ENCOUNTER — Ambulatory Visit (HOSPITAL_BASED_OUTPATIENT_CLINIC_OR_DEPARTMENT_OTHER): Payer: Medicare PPO | Attending: Pulmonary Disease | Admitting: Pulmonary Disease

## 2020-04-05 ENCOUNTER — Telehealth: Payer: Self-pay | Admitting: Cardiovascular Disease

## 2020-04-05 VITALS — Ht 63.0 in | Wt 122.4 lb

## 2020-04-05 DIAGNOSIS — R0902 Hypoxemia: Secondary | ICD-10-CM | POA: Insufficient documentation

## 2020-04-05 DIAGNOSIS — I2510 Atherosclerotic heart disease of native coronary artery without angina pectoris: Secondary | ICD-10-CM | POA: Diagnosis not present

## 2020-04-05 DIAGNOSIS — G4733 Obstructive sleep apnea (adult) (pediatric): Secondary | ICD-10-CM

## 2020-04-05 DIAGNOSIS — I130 Hypertensive heart and chronic kidney disease with heart failure and stage 1 through stage 4 chronic kidney disease, or unspecified chronic kidney disease: Secondary | ICD-10-CM | POA: Diagnosis not present

## 2020-04-05 DIAGNOSIS — J9621 Acute and chronic respiratory failure with hypoxia: Secondary | ICD-10-CM | POA: Diagnosis not present

## 2020-04-05 DIAGNOSIS — G4730 Sleep apnea, unspecified: Secondary | ICD-10-CM

## 2020-04-05 DIAGNOSIS — E1122 Type 2 diabetes mellitus with diabetic chronic kidney disease: Secondary | ICD-10-CM | POA: Diagnosis not present

## 2020-04-05 DIAGNOSIS — J449 Chronic obstructive pulmonary disease, unspecified: Secondary | ICD-10-CM

## 2020-04-05 DIAGNOSIS — D631 Anemia in chronic kidney disease: Secondary | ICD-10-CM | POA: Diagnosis not present

## 2020-04-05 DIAGNOSIS — I5023 Acute on chronic systolic (congestive) heart failure: Secondary | ICD-10-CM | POA: Diagnosis not present

## 2020-04-05 DIAGNOSIS — I2721 Secondary pulmonary arterial hypertension: Secondary | ICD-10-CM | POA: Diagnosis not present

## 2020-04-05 DIAGNOSIS — N184 Chronic kidney disease, stage 4 (severe): Secondary | ICD-10-CM | POA: Diagnosis not present

## 2020-04-05 DIAGNOSIS — J9611 Chronic respiratory failure with hypoxia: Secondary | ICD-10-CM

## 2020-04-05 NOTE — Telephone Encounter (Signed)
Spoke to nurse for [facility] who report she visited pt today and noticed she has gained 8 lbs in a week. Pt denies SOB but has mild bilateral leg edema. [place] RN, report pt stopped torsemide 3 days ago because she is having a sleep today performed today.   Nurse advised Reine Just to have pt resume daily torsemide but will route to MD for any further recommendations.

## 2020-04-05 NOTE — Telephone Encounter (Signed)
° °  Pt c/o swelling: STAT is pt has developed SOB within 24 hours  1) How much weight have you gained and in what time span? 8 lbs in one week  2) If swelling, where is the swelling located? 1+ edema legs and ankles  3) Are you currently taking a fluid pill? Not taking it for 2 days  4) Are you currently SOB? No, pt is on oxygen  5) Do you have a log of your daily weights (if so, list)?   6) Have you gained 3 pounds in a day or 5 pounds in a week? 8 lbs in one week  7) Have you traveled recently? No  Best boy from [facility], she visited pt today and noticed she gained 8 lbs from her last visit last week, pt does have some swelling but she thinks pt gained weight because nobody was there to monitor pt's food intake. She wanted to speak with a nurse to discuss it more and for recommendation.

## 2020-04-06 ENCOUNTER — Encounter (HOSPITAL_BASED_OUTPATIENT_CLINIC_OR_DEPARTMENT_OTHER): Payer: Medicare PPO | Admitting: Pulmonary Disease

## 2020-04-07 DIAGNOSIS — G4733 Obstructive sleep apnea (adult) (pediatric): Secondary | ICD-10-CM | POA: Diagnosis not present

## 2020-04-07 DIAGNOSIS — I5089 Other heart failure: Secondary | ICD-10-CM | POA: Diagnosis not present

## 2020-04-07 DIAGNOSIS — R531 Weakness: Secondary | ICD-10-CM | POA: Diagnosis not present

## 2020-04-07 DIAGNOSIS — I10 Essential (primary) hypertension: Secondary | ICD-10-CM | POA: Diagnosis not present

## 2020-04-07 DIAGNOSIS — J449 Chronic obstructive pulmonary disease, unspecified: Secondary | ICD-10-CM | POA: Diagnosis not present

## 2020-04-08 ENCOUNTER — Other Ambulatory Visit: Payer: Self-pay

## 2020-04-08 DIAGNOSIS — I2721 Secondary pulmonary arterial hypertension: Secondary | ICD-10-CM | POA: Diagnosis not present

## 2020-04-08 DIAGNOSIS — I130 Hypertensive heart and chronic kidney disease with heart failure and stage 1 through stage 4 chronic kidney disease, or unspecified chronic kidney disease: Secondary | ICD-10-CM | POA: Diagnosis not present

## 2020-04-08 DIAGNOSIS — D631 Anemia in chronic kidney disease: Secondary | ICD-10-CM | POA: Diagnosis not present

## 2020-04-08 DIAGNOSIS — I5023 Acute on chronic systolic (congestive) heart failure: Secondary | ICD-10-CM | POA: Diagnosis not present

## 2020-04-08 DIAGNOSIS — J9621 Acute and chronic respiratory failure with hypoxia: Secondary | ICD-10-CM | POA: Diagnosis not present

## 2020-04-08 DIAGNOSIS — E1122 Type 2 diabetes mellitus with diabetic chronic kidney disease: Secondary | ICD-10-CM | POA: Diagnosis not present

## 2020-04-08 DIAGNOSIS — N184 Chronic kidney disease, stage 4 (severe): Secondary | ICD-10-CM | POA: Diagnosis not present

## 2020-04-08 DIAGNOSIS — J449 Chronic obstructive pulmonary disease, unspecified: Secondary | ICD-10-CM | POA: Diagnosis not present

## 2020-04-08 DIAGNOSIS — I2510 Atherosclerotic heart disease of native coronary artery without angina pectoris: Secondary | ICD-10-CM | POA: Diagnosis not present

## 2020-04-08 NOTE — Telephone Encounter (Signed)
Spoke with the patient's daughter. Her weight had gone up recently but has since returned to her normal. The patient takes the Torsemide as needed based on weight fluctuation that is closely monitored by her daughter. Her current weight is 151 pounds.

## 2020-04-08 NOTE — Telephone Encounter (Signed)
Can we please check with her daughter, [name], and make sure that the 8-lb weight gain is indeed accurate?

## 2020-04-08 NOTE — Patient Outreach (Signed)
[facility]) Care Management  [place]  04/08/2020   [name] 28-Jul-1940 [id number]  Subjective: Telephone call to patient for disease follow up. Patient reports she is doing well. Patient reports she continues to weigh daily with last weight of 151.2 lbs.  Discussed with patient weight gain and when to notify physician. Patient reports that if she gains over 150 lbs she is to take her fluid pill. Reiterated signs of heart failure and when to notify physician.  She verbalized understanding. Patient continues with home health but report they will be discharging her next week.    Objective:   Encounter Medications:  Outpatient Encounter Medications as of 04/08/2020  Medication Sig  . ACCU-CHEK AVIVA PLUS test strip USE  TO TEST BLOOD SUGAR ONE TIME DAILY (Patient taking differently: 1 each by Other route as needed (To test blood sugar). )  . Accu-Chek Softclix Lancets lancets USE  AS  INSTRUCTED (Patient taking differently: 1 each by Other route as needed. )  . Alcohol Swabs (B-D SINGLE USE SWABS REGULAR) PADS USE  TO TEST BLOOD SUGAR ONE TIME DAILY (Patient taking differently: 1 each by Other route as needed (Clean skin before blood sugar reading). )  . aspirin 81 MG tablet Take 81 mg by mouth at bedtime.   [name] atorvastatin (LIPITOR) 40 MG tablet TAKE 1 TABLET EVERY DAY (Patient taking differently: Take 40 mg by mouth daily. )  . budesonide (PULMICORT) 0.5 MG/2ML nebulizer solution Take 2 mLs (0.5 mg total) by nebulization 2 (two) times daily. (Patient taking differently: Take 0.5 mg by nebulization daily. )  . carvedilol (COREG) 3.125 MG tablet Take 1 tablet (3.125 mg total) by mouth 2 (two) times daily with a meal.  . cholecalciferol (VITAMIN D3) 25 MCG (1000 UNIT) tablet Take 1,000 Units by mouth daily.  . ferrous sulfate 325 (65 FE) MG tablet Take 325 mg by mouth daily.   . Ipratropium-Albuterol (COMBIVENT RESPIMAT) 20-100 MCG/ACT AERS respimat Inhale 1 puff  into the lungs in the morning, at noon, and at bedtime.  [name] ipratropium-albuterol (DUONEB) 0.5-2.5 (3) MG/3ML SOLN Take 3 mLs by nebulization every 6 (six) hours as needed (wheezing, SOB).  . MYRBETRIQ 50 MG TB24 tablet TAKE 1 TABLET EVERY DAY (Patient taking differently: Take 50 mg by mouth daily as needed (overactive bladder). )  . OXYGEN Inhale 2 L into the lungs continuous.   . polyethylene glycol (MIRALAX / GLYCOLAX) 17 g packet Take 17 g by mouth daily as needed for moderate constipation.  . potassium chloride (KLOR-CON) 10 MEQ tablet TAKE 2 TABLETS(20 MEQ) BY MOUTH DAILY  . torsemide (DEMADEX) 20 MG tablet Take 3 tablets (60 mg total) by mouth daily.   No facility-administered encounter medications on file as of 04/08/2020.    Functional Status:  In your present state of health, do you have any difficulty performing the following activities: 04/08/2020 03/11/2020  Hearing? N N  Vision? N N  Difficulty concentrating or making decisions? N N  Walking or climbing stairs? [name]  Comment needs assist needs assist  Dressing or bathing? [name]  Comment family assist family assist  Doing errands, shopping? [name]  Comment family assists family assists  Preparing Food and eating ? N Y  Using the Toilet? N N  In the past six months, have you accidently leaked urine? Y Y  Comment incontinence incontinence  Do you have problems with loss of bowel control? N N  Managing your Medications? [name]  Comment family manages Family manages  Managing your Finances? [name]  Comment family manages family manages  Housekeeping or managing your Housekeeping? [name]  Comment family helps family helps  Some recent data might be hidden    Fall/Depression Screening: Fall Risk  04/08/2020 03/11/2020 10/04/2019  Falls in the past year? 0 0 0  Number falls in past yr: - - 0  Injury with Fall? - - 0   PHQ 2/9 Scores 04/08/2020 03/11/2020 10/04/2019 04/04/2019 01/27/2018 09/01/2017  PHQ - 2 Score 0 0 0 0 0 0    Assessment:  Patient managing heart failure and other chronic conditions.  Disease management follow up continues.    Plan:  [facility] CM Care Plan Problem One     Most Recent Value  Care Plan Problem One Konwledge deficit heart failure  Role Documenting the Problem One Care Management Telephonic Coordinator  Care Plan for Problem One Active  [facility] Long Term Goal  Patient will not experience unplanned hospital readmission within the next 31 days  [facility] Long Term Goal Start Date 03/11/20  Interventions for Problem One Long Term Goal Patient continues daily regimen with heart failure and knows  weight parameter.    [facility] CM Short Term Goal #1  Patient will be abte to verbalize signs of heart failure within 21 days.  [facility] CM Short Term Goal #1 Start Date 03/11/20  [facility] CM Short Term Goal #1 Met Date 04/08/20     RN CM will contact patient again next month and patient agreeable.   [name], RN, MSN [place] [facility] Care Management Coordinator Direct Line [phone] Cell [phone] Toll Free: [phone]  Fax: [phone]

## 2020-04-08 NOTE — Telephone Encounter (Signed)
Thanks. I figured [name] would have contacted [place] if it was a problem.

## 2020-04-09 DIAGNOSIS — D631 Anemia in chronic kidney disease: Secondary | ICD-10-CM | POA: Diagnosis not present

## 2020-04-09 DIAGNOSIS — I10 Essential (primary) hypertension: Secondary | ICD-10-CM | POA: Diagnosis not present

## 2020-04-09 DIAGNOSIS — J449 Chronic obstructive pulmonary disease, unspecified: Secondary | ICD-10-CM | POA: Diagnosis not present

## 2020-04-09 DIAGNOSIS — I2510 Atherosclerotic heart disease of native coronary artery without angina pectoris: Secondary | ICD-10-CM | POA: Diagnosis not present

## 2020-04-09 DIAGNOSIS — N184 Chronic kidney disease, stage 4 (severe): Secondary | ICD-10-CM | POA: Diagnosis not present

## 2020-04-09 DIAGNOSIS — E1122 Type 2 diabetes mellitus with diabetic chronic kidney disease: Secondary | ICD-10-CM | POA: Diagnosis not present

## 2020-04-09 DIAGNOSIS — G4733 Obstructive sleep apnea (adult) (pediatric): Secondary | ICD-10-CM | POA: Diagnosis not present

## 2020-04-09 DIAGNOSIS — I130 Hypertensive heart and chronic kidney disease with heart failure and stage 1 through stage 4 chronic kidney disease, or unspecified chronic kidney disease: Secondary | ICD-10-CM | POA: Diagnosis not present

## 2020-04-09 DIAGNOSIS — I2721 Secondary pulmonary arterial hypertension: Secondary | ICD-10-CM | POA: Diagnosis not present

## 2020-04-09 DIAGNOSIS — I5023 Acute on chronic systolic (congestive) heart failure: Secondary | ICD-10-CM | POA: Diagnosis not present

## 2020-04-09 DIAGNOSIS — J9621 Acute and chronic respiratory failure with hypoxia: Secondary | ICD-10-CM | POA: Diagnosis not present

## 2020-04-09 NOTE — Progress Notes (Signed)
[name] is a 79 y.o. female who presents for annual wellness visit, CPE and follow-up on chronic medical conditions.  She has the following concerns: No concerns today. She is here with her daughter and reports doing well.   Using CPAP and oxygen at night as well as prn. Followed by Dr. Halford Chessman   Diabetes- diet controlled. Blood sugar this morning was 101.   CHF- Weight stable.  Diuretic is being adjusted based on weight.  Managed by cardiologist   She saw Dr. [name] this morning and had labs done.       Immunization History  Administered Date(s) Administered  . Fluad Quad(high Dose 65+) 04/04/2019  . Influenza, High Dose Seasonal PF 08/04/2016, 05/02/2017, 04/06/2018  . Influenza-Unspecified 05/23/2013, 08/25/2013, 06/22/2014, 06/22/2014, 05/22/2015  . PFIZER SARS-COV-2 Vaccination 09/12/2019, 10/03/2019  . Pneumococcal Conjugate-13 06/22/2014  . Pneumococcal-Unspecified 01/12/2008  . Tdap 01/12/2008  . Zoster 07/21/2011, 12/08/2011   Last Pap smear: years ago  Last mammogram: 12/2019 Last colonoscopy: 2019 Last DEXA: 12/2009 Dentist: Dr. Burt Ek Ophtho: Dr. Katy Fitch Exercise: PT finished it yesterday   Other doctors caring for patient include: Dr. Elisha Ponder- podiatry Dr. [name]- Cardiology Dr. Halford Chessman- Pulmonology Dr. Havery Moros- GI Dr.Bhandari- Kidney   Depression screen:  See questionnaire below.  Depression screen [facility] 2/9 04/10/2020 04/08/2020 03/11/2020 10/04/2019 04/04/2019  Decreased Interest 0 0 0 0 0  Down, Depressed, Hopeless 0 0 0 0 0  PHQ - 2 Score 0 0 0 0 0    Fall Risk Screen: see questionnaire below. Fall Risk  04/10/2020 04/08/2020 03/11/2020 10/04/2019 04/04/2019  Falls in the past year? 0 0 0 0 0  Number falls in past yr: 0 - - 0 0  Injury with Fall? 0 - - 0 0    ADL screen:  See questionnaire below Functional Status Survey: Is the patient deaf or have difficulty hearing?: No Does the patient have difficulty seeing, even when wearing  glasses/contacts?: No Does the patient have difficulty concentrating, remembering, or making decisions?: No Does the patient have difficulty walking or climbing stairs?: Yes Does the patient have difficulty dressing or bathing?: No Does the patient have difficulty doing errands alone such as visiting a doctor's office or shopping?: Yes   End of Life Discussion:  Patient does not have a living will and medical power of attorney. Paperwork discussed and sent home with patient. MOST form filled out and discussed. DNR  Review of Systems Constitutional: -fever, -chills, -sweats, -unexpected weight change, -anorexia, -fatigue Allergy: -sneezing, -itching, -congestion Dermatology: denies changing moles, rash, lumps, new worrisome lesions ENT: -runny nose, -ear pain, -sore throat, -hoarseness, -sinus pain, -teeth pain, -tinnitus, -hearing loss, -epistaxis Cardiology:  -chest pain, -palpitations, -edema, -orthopnea, -paroxysmal nocturnal dyspnea Respiratory: -cough, -shortness of breath, -dyspnea on exertion, -wheezing, -hemoptysis Gastroenterology: -abdominal pain, -nausea, -vomiting, -diarrhea, -constipation, -blood in stool, -changes in bowel movement, -dysphagia Hematology: -bleeding or bruising problems Musculoskeletal: -arthralgias, -myalgias, -joint swelling, -back pain, -neck pain, -cramping, -gait changes Ophthalmology: -vision changes, -eye redness, -itching, -discharge Urology: -dysuria, -difficulty urinating, -hematuria, -urinary frequency, -urgency, -incontinence Neurology: -headache, -weakness, -tingling, -numbness, -speech abnormality, -memory loss, -falls, -dizziness Psychology:  -depressed mood, -agitation, -sleep problems    PHYSICAL EXAM:  BP 122/70   Pulse (!) 53   Ht _0  (1.575 m)   Wt 154 lb 9.6 oz (70.1 kg)   BMI 28.28 kg/m   General Appearance: Alert, cooperative, no distress, appears stated age Head: Normocephalic, without obvious abnormality, atraumatic Eyes:  PERRL, conjunctiva/corneas clear, EOM's intact  Ears: Normal TM's and external ear canals Nose: mask on  Throat: mask on  Neck: Supple, no lymphadenopathy; thyroid: no enlargement/tenderness/nodules Back: Spine nontender, no curvature, ROM normal, no CVA tenderness Lungs: Clear to auscultation bilaterally without wheezes, rales or ronchi; respirations unlabored Heart: Regular rhythm,  no murmur, rub or gallop Breast Exam: Not done  Abdomen: Soft, non-tender, nondistended, normoactive bowel sounds, no masses, no hepatosplenomegaly Genitalia: deferred  Extremities: No edema Pulses: 2+ and symmetric all extremities Skin: Skin color, texture, turgor normal, no rashes or lesions Lymph nodes: Cervical, supraclavicular, and axillary nodes normal Neurologic: CNII-XII intact, normal strength, sensation and gait Psych: Normal mood, affect, hygiene and grooming.  ASSESSMENT/PLAN: Medicare annual wellness visit, subsequent -She is here today with her daughter for Medicare wellness visit.  Denies any recent falls, depression, difficulties with ADLs or memory.  She has difficulty with stairs but there are no stairs in her home.  She sees several specialists.  Advanced directives discussed.  She is a DNR.  Her daughter states they do have a DNR form at home.  Routine general medical examination at a health care facility -Preventive health care reviewed.  Continue seeing specialists as recommended.  She appears to be taking good care of herself and is in good spirits.  Immunizations reviewed.  She is aware that she can get her Tdap at her pharmacy.  She may also get her [place] Covid booster in the next month.  Diabetes mellitus type 2, diet-controlled ([place]) -Blood sugars at home are in goal range.  OSA on CPAP -Using CPAP.  Followed by Dr. Halford Chessman  Chronic combined systolic (congestive) and diastolic (congestive) heart failure ([place]) -She and her daughters keep a close eye on her weights and adjust  diuretic as appropriate.  Followed closely by cardiology.     Discussed monthly self breast exams and yearly mammograms; at least 30 minutes of aerobic activity at least 5 days/week and weight-bearing exercise 2x/week; proper sunscreen use reviewed; healthy diet, including goals of calcium and vitamin D intake and alcohol recommendations (less than or equal to 1 drink/day) reviewed; regular seatbelt use; changing batteries in smoke detectors.  Immunization recommendations discussed.  Colonoscopy recommendations reviewed   Medicare Attestation I have personally reviewed: The patient's medical and social history Their use of alcohol, tobacco or illicit drugs Their current medications and supplements The patient's functional ability including ADLs,fall risks, home safety risks, cognitive, and hearing and visual impairment Diet and physical activities Evidence for depression or mood disorders  The patient's weight, height, and BMI have been recorded in the chart.  I have made referrals, counseling, and provided education to the patient based on review of the above and I have provided the patient with a written personalized care plan for preventive services.     Harland Dingwall, NP-C   04/10/2020

## 2020-04-10 ENCOUNTER — Ambulatory Visit (INDEPENDENT_AMBULATORY_CARE_PROVIDER_SITE_OTHER): Payer: Medicare PPO | Admitting: Family Medicine

## 2020-04-10 ENCOUNTER — Other Ambulatory Visit: Payer: Self-pay

## 2020-04-10 ENCOUNTER — Encounter: Payer: Self-pay | Admitting: Family Medicine

## 2020-04-10 ENCOUNTER — Ambulatory Visit: Payer: Medicare PPO | Admitting: Pulmonary Disease

## 2020-04-10 VITALS — BP 122/70 | HR 53 | Ht 62.0 in | Wt 154.6 lb

## 2020-04-10 DIAGNOSIS — I5042 Chronic combined systolic (congestive) and diastolic (congestive) heart failure: Secondary | ICD-10-CM

## 2020-04-10 DIAGNOSIS — I2721 Secondary pulmonary arterial hypertension: Secondary | ICD-10-CM | POA: Diagnosis not present

## 2020-04-10 DIAGNOSIS — G4733 Obstructive sleep apnea (adult) (pediatric): Secondary | ICD-10-CM

## 2020-04-10 DIAGNOSIS — Z9989 Dependence on other enabling machines and devices: Secondary | ICD-10-CM

## 2020-04-10 DIAGNOSIS — I130 Hypertensive heart and chronic kidney disease with heart failure and stage 1 through stage 4 chronic kidney disease, or unspecified chronic kidney disease: Secondary | ICD-10-CM | POA: Diagnosis not present

## 2020-04-10 DIAGNOSIS — Z0000 Encounter for general adult medical examination without abnormal findings: Secondary | ICD-10-CM

## 2020-04-10 DIAGNOSIS — J449 Chronic obstructive pulmonary disease, unspecified: Secondary | ICD-10-CM | POA: Diagnosis not present

## 2020-04-10 DIAGNOSIS — I2510 Atherosclerotic heart disease of native coronary artery without angina pectoris: Secondary | ICD-10-CM | POA: Diagnosis not present

## 2020-04-10 DIAGNOSIS — J9621 Acute and chronic respiratory failure with hypoxia: Secondary | ICD-10-CM | POA: Diagnosis not present

## 2020-04-10 DIAGNOSIS — D631 Anemia in chronic kidney disease: Secondary | ICD-10-CM | POA: Diagnosis not present

## 2020-04-10 DIAGNOSIS — E119 Type 2 diabetes mellitus without complications: Secondary | ICD-10-CM

## 2020-04-10 DIAGNOSIS — N184 Chronic kidney disease, stage 4 (severe): Secondary | ICD-10-CM | POA: Diagnosis not present

## 2020-04-10 DIAGNOSIS — E1122 Type 2 diabetes mellitus with diabetic chronic kidney disease: Secondary | ICD-10-CM | POA: Diagnosis not present

## 2020-04-10 DIAGNOSIS — I5023 Acute on chronic systolic (congestive) heart failure: Secondary | ICD-10-CM | POA: Diagnosis not present

## 2020-04-10 NOTE — Patient Instructions (Signed)
[name] , Thank you for taking time to come for your Medicare Wellness Visit. I appreciate your ongoing commitment to your health goals. Please review the following plan we discussed and let me know if I can assist you in the future.   These are the goals we discussed:  You can get your Tdap (tetanus, diptheria and pertussis) at your pharmacy      This is a list of the screening recommended for you and due dates:  Health Maintenance  Topic Date Due   Tetanus Vaccine  01/11/2018   Eye exam for diabetics  02/07/2020   Flu Shot  02/18/2020   Complete foot exam   04/03/2020   Urine Protein Check  04/03/2020   Hemoglobin A1C  08/17/2020   DEXA scan (bone density measurement)  Completed   COVID-19 Vaccine  Completed   Pneumonia vaccines  Completed

## 2020-04-11 ENCOUNTER — Telehealth: Payer: Self-pay | Admitting: Pulmonary Disease

## 2020-04-11 DIAGNOSIS — J449 Chronic obstructive pulmonary disease, unspecified: Secondary | ICD-10-CM | POA: Diagnosis not present

## 2020-04-11 NOTE — Telephone Encounter (Signed)
PSG 04/05/20 >> AHI 4.7, RDI 6.4, REM AHI 21.3, SpO2 low 83%.  Used 2 liters oxygen.   Please inform her that her sleep study shows mild obstructive sleep apnea and low oxygen at night.  Please arrange for ROV with me or NP to discuss treatment options.

## 2020-04-11 NOTE — Procedures (Signed)
    Patient Name: [name], [name] Date: 04/05/2020 Gender: Female D.O.B: 1941/04/16 Age (years): 62 Referring Provider: [name] MD, ABSM Height (inches): 63 Interpreting Physician: [name] MD, ABSM Weight (lbs): 122 RPSGT: Earney [place] BMI: 22 MRN: [id number] Neck Size: 14.00  CLINICAL INFORMATION Sleep Study Type: NPSG  Indication for sleep study: OSA  Epworth Sleepiness Score: 6  SLEEP STUDY TECHNIQUE As per the AASM Manual for the Scoring of Sleep and Associated Events v2.3 (April 2016) with a hypopnea requiring 4% desaturations.  The channels recorded and monitored were frontal, central and occipital EEG, electrooculogram (EOG), submentalis EMG (chin), nasal and oral airflow, thoracic and abdominal wall motion, anterior tibialis EMG, snore microphone, electrocardiogram, and pulse oximetry.  MEDICATIONS Medications self-administered by patient taken the night of the study : N/A  SLEEP ARCHITECTURE The study was initiated at 10:22:22 PM and ended at 4:41:35 AM.  Sleep onset time was 20.4 minutes and the sleep efficiency was 81.6%%. The total sleep time was 309.3 minutes.  Stage REM latency was 197.5 minutes.  The patient spent 0.3%% of the night in stage N1 sleep, 84.2%% in stage N2 sleep, 0.0%% in stage N3 and 15.5% in REM.  Alpha intrusion was absent.  Supine sleep was 75.11%.  RESPIRATORY PARAMETERS The overall respiratory disturbance index (RDI) was 6.4 per hour. There were 5 total apneas, including 5 obstructive, 0 central and 0 mixed apneas. There were 19 hypopneas and 9 RERAs.  The AHI during Stage REM sleep was 21.3 per hour.  AHI while supine was 5.2 per hour.  The mean oxygen saturation was 94.4%. The minimum SpO2 during sleep was 83.0%.  She required 2 liters supplemental oxygen during the study.  soft snoring was noted during this study.  CARDIAC DATA The 2 lead EKG demonstrated sinus rhythm. The mean heart rate was 63.2 beats  per minute. Other EKG findings include: None.  LEG MOVEMENT DATA The total PLMS were 0 with a resulting PLMS index of 0.0. Associated arousal with leg movement index was 0.0 .  IMPRESSIONS - Mild obstructive sleep apnea occurred with a respiratory disturbance index of 6.4 and SpO2 low of 83%.  She had a significant REM effect. - She had oxygen desaturation below SpO2 of 88% in the abscence of other respiratory events and this lasted more than 5 minutes.  Her oxygenation improved with addition of 2 liters supplemental oxygen. - The patient snored with soft snoring volume.  DIAGNOSIS - Sleep apnea, not otherwise specified - Nocturnal hypoxia  RECOMMENDATIONS - She should be tried on CPAP with supplemental oxygen.  [Electronically signed] 04/11/2020 02:17 PM  [name] MD, [place], American Board of Sleep Medicine   NPI: [id number]

## 2020-04-12 NOTE — Telephone Encounter (Signed)
Called and spoke with patient's daughter, [name] per [facility] about sleep study results. Scheduled follow up visit with NP for Wednesday 04/24/2020 at 3:30pm per Dr Halford Chessman. [name] expressed understanding. Nothing further needed at this time.

## 2020-04-15 ENCOUNTER — Other Ambulatory Visit (INDEPENDENT_AMBULATORY_CARE_PROVIDER_SITE_OTHER): Payer: Medicare PPO

## 2020-04-15 ENCOUNTER — Ambulatory Visit (INDEPENDENT_AMBULATORY_CARE_PROVIDER_SITE_OTHER): Payer: Medicare PPO | Admitting: Podiatry

## 2020-04-15 ENCOUNTER — Encounter: Payer: Self-pay | Admitting: Podiatry

## 2020-04-15 ENCOUNTER — Other Ambulatory Visit: Payer: Self-pay

## 2020-04-15 DIAGNOSIS — I129 Hypertensive chronic kidney disease with stage 1 through stage 4 chronic kidney disease, or unspecified chronic kidney disease: Secondary | ICD-10-CM | POA: Diagnosis not present

## 2020-04-15 DIAGNOSIS — M79674 Pain in right toe(s): Secondary | ICD-10-CM | POA: Diagnosis not present

## 2020-04-15 DIAGNOSIS — B351 Tinea unguium: Secondary | ICD-10-CM

## 2020-04-15 DIAGNOSIS — M79675 Pain in left toe(s): Secondary | ICD-10-CM | POA: Diagnosis not present

## 2020-04-15 DIAGNOSIS — N2581 Secondary hyperparathyroidism of renal origin: Secondary | ICD-10-CM | POA: Diagnosis not present

## 2020-04-15 DIAGNOSIS — Z23 Encounter for immunization: Secondary | ICD-10-CM

## 2020-04-15 DIAGNOSIS — Z794 Long term (current) use of insulin: Secondary | ICD-10-CM | POA: Diagnosis not present

## 2020-04-15 DIAGNOSIS — L84 Corns and callosities: Secondary | ICD-10-CM

## 2020-04-15 DIAGNOSIS — E1142 Type 2 diabetes mellitus with diabetic polyneuropathy: Secondary | ICD-10-CM | POA: Diagnosis not present

## 2020-04-15 DIAGNOSIS — E0822 Diabetes mellitus due to underlying condition with diabetic chronic kidney disease: Secondary | ICD-10-CM

## 2020-04-15 DIAGNOSIS — N1832 Chronic kidney disease, stage 3b: Secondary | ICD-10-CM | POA: Diagnosis not present

## 2020-04-15 DIAGNOSIS — D631 Anemia in chronic kidney disease: Secondary | ICD-10-CM | POA: Diagnosis not present

## 2020-04-15 DIAGNOSIS — M2042 Other hammer toe(s) (acquired), left foot: Secondary | ICD-10-CM | POA: Diagnosis not present

## 2020-04-15 DIAGNOSIS — M2041 Other hammer toe(s) (acquired), right foot: Secondary | ICD-10-CM | POA: Diagnosis not present

## 2020-04-15 DIAGNOSIS — N184 Chronic kidney disease, stage 4 (severe): Secondary | ICD-10-CM | POA: Diagnosis not present

## 2020-04-15 DIAGNOSIS — R609 Edema, unspecified: Secondary | ICD-10-CM | POA: Diagnosis not present

## 2020-04-15 DIAGNOSIS — I272 Pulmonary hypertension, unspecified: Secondary | ICD-10-CM | POA: Diagnosis not present

## 2020-04-15 DIAGNOSIS — I509 Heart failure, unspecified: Secondary | ICD-10-CM | POA: Diagnosis not present

## 2020-04-16 MED ORDER — NONFORMULARY OR COMPOUNDED ITEM: 3 refills | Status: DC

## 2020-04-17 DIAGNOSIS — I5023 Acute on chronic systolic (congestive) heart failure: Secondary | ICD-10-CM | POA: Diagnosis not present

## 2020-04-17 DIAGNOSIS — I2721 Secondary pulmonary arterial hypertension: Secondary | ICD-10-CM | POA: Diagnosis not present

## 2020-04-17 DIAGNOSIS — D631 Anemia in chronic kidney disease: Secondary | ICD-10-CM | POA: Diagnosis not present

## 2020-04-17 DIAGNOSIS — N184 Chronic kidney disease, stage 4 (severe): Secondary | ICD-10-CM | POA: Diagnosis not present

## 2020-04-17 DIAGNOSIS — J9621 Acute and chronic respiratory failure with hypoxia: Secondary | ICD-10-CM | POA: Diagnosis not present

## 2020-04-17 DIAGNOSIS — I130 Hypertensive heart and chronic kidney disease with heart failure and stage 1 through stage 4 chronic kidney disease, or unspecified chronic kidney disease: Secondary | ICD-10-CM | POA: Diagnosis not present

## 2020-04-17 DIAGNOSIS — E1122 Type 2 diabetes mellitus with diabetic chronic kidney disease: Secondary | ICD-10-CM | POA: Diagnosis not present

## 2020-04-17 DIAGNOSIS — J449 Chronic obstructive pulmonary disease, unspecified: Secondary | ICD-10-CM | POA: Diagnosis not present

## 2020-04-17 DIAGNOSIS — I2510 Atherosclerotic heart disease of native coronary artery without angina pectoris: Secondary | ICD-10-CM | POA: Diagnosis not present

## 2020-04-18 NOTE — Progress Notes (Signed)
Subjective:  Patient ID: [name], female    DOB: 1941/06/26,  MRN: [id number]  79 y.o. female presents with at risk foot care. Pt has h/o NIDDM with chronic kidney disease and painful thick toenails that are difficult to trim. Pain interferes with ambulation. Aggravating factors include wearing enclosed shoe gear. Pain is relieved with periodic professional debridement..    Her daughter is present during today's visit. Patient is also here to pick up diabetic shoes.  She would like to discuss treatment options for her toenails. She has used [employer] adviser topical solution in the past.   PCP: Girtha Rm, NP-C and last visit was: 03/13/2020  Review of Systems: Negative except as noted in the HPI.  Past Medical History:  Diagnosis Date  . Anemia   . CAD (coronary artery disease)   . Cardiac defibrillator in situ   . Cataract    bilateral 2017  . CHF (congestive heart failure) ([place])   . Chronic systolic heart failure ([place])   . CKD (chronic kidney disease) stage 4, GFR 15-29 ml/min (HCC)   . COPD (chronic obstructive pulmonary disease) ([place])   . Dyspnea   . HTN (hypertension)   . 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 Rollene Rotunda in [place]. Per notes she was due for colonoscopy in 12/2016  . OSA on CPAP 09/08/2017  . Other cardiomyopathies ([place])   . Oxygen deficiency    2 liters at night  . Pulmonary HTN ([place])   . Sleep apnea    uses c-pap  . Stroke ([place])   . Type 2 diabetes, controlled, with peripheral neuropathy ([place])   . Urge incontinence of urine    Past Surgical History:  Procedure Laterality Date  . BIOPSY  03/29/2018   Procedure: BIOPSY;  Surgeon: Yetta Flock, MD;  Location: [facility];  Service: Gastroenterology;;  . COLONOSCOPY    . COLONOSCOPY WITH PROPOFOL N/A 03/29/2018   Procedure: COLONOSCOPY WITH PROPOFOL;  Surgeon: Yetta Flock, MD;  Location: [facility];  Service: Gastroenterology;  Laterality: N/A;  .  ESOPHAGOGASTRODUODENOSCOPY (EGD) WITH PROPOFOL N/A 03/29/2018   Procedure: ESOPHAGOGASTRODUODENOSCOPY (EGD) WITH PROPOFOL;  Surgeon: Yetta Flock, MD;  Location: [facility];  Service: Gastroenterology;  Laterality: N/A;  . POLYPECTOMY  03/29/2018   Procedure: POLYPECTOMY;  Surgeon: Yetta Flock, MD;  Location: [facility];  Service: Gastroenterology;;  . RIGHT HEART CATH N/A 02/15/2020   Procedure: RIGHT HEART CATH;  Surgeon: Jolaine Artist, MD;  Location: [facility];  Service: Cardiovascular;  Laterality: N/A;  . TONSILLECTOMY AND ADENOIDECTOMY     age 2  . TUBAL LIGATION     years ago  . UPPER GASTROINTESTINAL ENDOSCOPY    . UPPER GI ENDOSCOPY  11/28/2009   mild erosive esophagitis, mild erosive changes in stomach, chronic constipation   Patient Active Problem List   Diagnosis Date Noted  . Acute on chronic systolic CHF (congestive heart failure) ([place]) 03/04/2020  . Transient hypotension 03/04/2020  . Acute renal failure superimposed on stage 4 chronic kidney disease ([place]) 03/04/2020  . Pulmonary hypertension, unspecified ([place])   . Acute on chronic combined systolic and diastolic CHF (congestive heart failure) ([place]) 02/12/2020  . Acute on chronic respiratory failure with hypoxia ([place]) 02/12/2020  . CHF exacerbation ([place]) 02/12/2020  . D dimer value normal   . Diabetes mellitus type 2, diet-controlled ([place])   . Abnormal result of cardiovascular function study, unspecified 09/16/2018  . Callus 09/16/2018  . Claudication of both  lower extremities ([place]) 09/16/2018  . Dependence on other enabling machines and devices 09/16/2018  . Dizziness 09/16/2018  . Dyspnea, unspecified 09/16/2018  . Medicare annual wellness visit, subsequent 09/16/2018  . Onychomycosis due to dermatophyte 09/16/2018  . Overgrown toenails 09/16/2018  . Other secondary pulmonary hypertension ([place]) 09/16/2018  . Iron deficiency anemia   . On home O2   . Benign neoplasm of colon   .  PAH (pulmonary artery hypertension) ([place]) 09/09/2017  . PAT (paroxysmal atrial tachycardia) ([place]) 09/09/2017  . Hypercholesterolemia 09/09/2017  . OSA on CPAP 09/08/2017  . 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 Type 2 diabetes, controlled, with peripheral neuropathy ([place])   . Other cardiomyopathies ([place])   . Chronic combined systolic (congestive) and diastolic (congestive) heart failure ([place])   . CKD (chronic kidney disease) stage 4, GFR 15-29 ml/min (HCC)   . ICD (implantable cardioverter-defibrillator) in place   . Urge incontinence of urine   . Coronary artery disease involving native coronary artery of native heart without angina pectoris   . Chronic kidney disease 09/06/2017  . Restrictive lung disease 05/04/2017  . Body mass index (bmi) 33.0-33.9, adult 06/13/2014  . DEPRESSION 07/26/2007  . Essential hypertension 07/26/2007  . CARDIOMYOPATHY 07/26/2007  . COPD (chronic obstructive pulmonary disease) ([place]) 07/26/2007    Current Outpatient Medications:  .  ACCU-CHEK AVIVA PLUS test strip, USE  TO TEST BLOOD SUGAR ONE TIME DAILY (Patient taking differently: 1 each by Other route as needed (To test blood sugar). ), Disp: 100 strip, Rfl: 1 .  Accu-Chek Softclix Lancets lancets, USE  AS  INSTRUCTED (Patient taking differently: 1 each by Other route as needed. ), Disp: 100 each, Rfl: 2 .  Alcohol Swabs (B-D SINGLE USE SWABS REGULAR) PADS, USE  TO TEST BLOOD SUGAR ONE TIME DAILY (Patient taking differently: 1 each by Other route as needed (Clean skin before blood sugar reading). ), Disp: 100 each, Rfl: 1 .  aspirin 81 MG tablet, Take 81 mg by mouth at bedtime. , Disp: , Rfl:  .  atorvastatin (LIPITOR) 40 MG tablet, TAKE 1 TABLET EVERY DAY (Patient taking differently: Take 40 mg by mouth daily. ), Disp: 90 tablet, Rfl: 0 .  budesonide (PULMICORT) 0.5 MG/2ML nebulizer solution, Take 2 mLs (0.5 mg total) by nebulization 2 (two) times daily. (Patient taking differently: Take 0.5 mg  by nebulization daily. ), Disp: 60 mL, Rfl: 11 .  carvedilol (COREG) 3.125 MG tablet, Take 1 tablet (3.125 mg total) by mouth 2 (two) times daily with a meal., Disp: 60 tablet, Rfl: 2 .  cholecalciferol (VITAMIN D3) 25 MCG (1000 UNIT) tablet, Take 1,000 Units by mouth daily., Disp: , Rfl:  .  ferrous sulfate 325 (65 FE) MG tablet, Take 325 mg by mouth daily. , Disp: , Rfl:  .  ipratropium-albuterol (DUONEB) 0.5-2.5 (3) MG/3ML SOLN, Take 3 mLs by nebulization every 6 (six) hours as needed (wheezing, SOB)., Disp: 90 mL, Rfl: 11 .  MYRBETRIQ 50 MG TB24 tablet, TAKE 1 TABLET EVERY DAY (Patient taking differently: Take 50 mg by mouth daily as needed (overactive bladder). ), Disp: 90 tablet, Rfl: 0 .  NONFORMULARY OR COMPOUNDED ITEM, Antifungal solution: Terbinafine 3%, Fluconazole 2%, Tea Tree Oil 5%, Urea 10%, Ibuprofen 2% in DMSO suspension #5m, Disp: 1 each, Rfl: 3 .  OXYGEN, Inhale 2 L into the lungs continuous. , Disp: , Rfl:  .  polyethylene glycol (MIRALAX / GLYCOLAX) 17 g packet, Take 17 g by mouth daily as needed  for moderate constipation., Disp: 14 each, Rfl: 0 .  potassium chloride (KLOR-CON) 10 MEQ tablet, TAKE 2 TABLETS(20 MEQ) BY MOUTH DAILY, Disp: 180 tablet, Rfl: 0 .  torsemide (DEMADEX) 20 MG tablet, Take 3 tablets (60 mg total) by mouth daily. (Patient taking differently: Take 60 mg by mouth as needed. Based on weight.), Disp: 90 tablet, Rfl: 2 Allergies  Allergen Reactions  . Benazepril Hcl Other (See Comments)    Unknown  . Lotrel [Amlodipine Besy-Benazepril Hcl] Other (See Comments)    Hypotension   . Talwin [Pentazocine] Other (See Comments)    Hallucinations     Social History   Tobacco Use  Smoking Status Former Smoker  . Quit date: 03/14/2009  . Years since quitting: 11.1  Smokeless Tobacco Never Used  Tobacco Comment   quit smoking 2010   Objective:  There were no vitals filed for this visit. Constitutional Patient is a pleasant 79 y.o. African American female  WD, WN in NAD.[name] AAO x 3.  Vascular Capillary fill time to digits <3 seconds b/l lower extremities. Faintly palpable DP pulse(s) b/l lower extremities. Nonpalpable PT pulse(s) b/l lower extremities. Pedal hair sparse. Lower extremity skin temperature gradient within normal limits. No cyanosis or clubbing noted.  Neurologic Normal speech. Oriented to person, place, and time. Protective sensation intact 5/5 intact bilaterally with 10g monofilament b/l. Vibratory sensation intact b/l.  Dermatologic Pedal skin with normal turgor, texture and tone bilaterally. No open wounds bilaterally. No interdigital macerations bilaterally. Toenails 1-5 b/l elongated, discolored, dystrophic, thickened, crumbly with subungual debris and tenderness to dorsal palpation. Hyperkeratotic lesion(s) submet head 1 left foot, submet head 1 right foot, submet head 5 left foot and submet head 5 right foot.  No erythema, no edema, no drainage, no flocculence.  Orthopedic: Normal muscle strength 5/5 to all lower extremity muscle groups bilaterally. No pain crepitus or joint limitation noted with ROM b/l. Hammertoes noted to the 2-5 bilaterally.   Hemoglobin A1C Latest Ref Rng & Units 02/15/2020 10/04/2019  HGBA1C 4.8 - 5.6 % 6.5(H) 6.0(H)  Some recent data might be hidden   Assessment:   1. Pain due to onychomycosis of toenails of both feet   2. Callus   3. Diabetes mellitus due to underlying condition with stage 4 chronic kidney disease, with long-term current use of insulin ([place])    Plan:  Patient was evaluated and treated and all questions answered.  Onychomycosis with pain -Nails palliatively debridement as below. -Educated on self-care  Procedure: Nail Debridement Rationale: Pain Type of Debridement: manual, sharp debridement. Instrumentation: Nail nipper, rotary burr. Number of Nails: 10  -Examined patient. -Continue diabetic foot care principles. -Toenails 1-5 b/l were debrided in length and girth with sterile  nail nippers and dremel without iatrogenic bleeding. Discussed topical, laser and oral medication. Patient opted for topical treatment with compounded medication. Rx written for nonformulary compounding topical antifungal: [facility]: Antifungal cream - Terbinafine 3%, Fluconazole 2%, Tea Tree Oil 5%, Urea 10%, Ibuprofen 2% in DMSO Suspension #59m. Apply to the affected nail(s) once daily. -Callus(es) submet head 1 left foot, submet head 1 right foot, submet head 5 left foot and submet head 5 right foot pared utilizing sterile scalpel blade without complication or incident. Total number debrided =4. -Dispensed one pair of extra depth shoes and 3 pairs of custom-molded tonal contact multidensity Plastazote/EVA inserts that were made from a positive model of the patient's foot. Custom molded inserts were required to achieve and maintain total contact with the plantar aspect  of the patient's foot for the life of the device and to prevent tissue damage. Three pairs of inserts are required in this patient due to bottoming out and loss of protection after four months of wear. The shoes fit well and the inserts achieve total contact with the plantar surface of the patient's foot. A statement of certifying physician is on file in the patient's chart that documents the medical necessity for footwear and/or inserts. The patient was given a copy of the supplier standards, the return policy and new shoe break-in instructions. The patient was advised to wear the shoes at home for only one hour on the first day and to check their feet for any sores or irritations. The patient will call if any problems arise. -Patient to report any pedal injuries to medical professional immediately. -Patient to continue soft, supportive shoe gear daily. -Patient/POA to call should there be question/concern in the interim.  Return in about 3 months (around 07/15/2020).  Marzetta Board, DPM

## 2020-04-22 ENCOUNTER — Ambulatory Visit (HOSPITAL_COMMUNITY)
Admission: RE | Admit: 2020-04-22 | Discharge: 2020-04-22 | Disposition: A | Discharge status: Home / Self Care | Payer: Medicare PPO | Source: Ambulatory Visit | Attending: Adult Health | Admitting: Adult Health

## 2020-04-22 ENCOUNTER — Other Ambulatory Visit: Payer: Self-pay

## 2020-04-22 VITALS — BP 140/72 | HR 83 | Wt 157.6 lb

## 2020-04-22 DIAGNOSIS — I2510 Atherosclerotic heart disease of native coronary artery without angina pectoris: Secondary | ICD-10-CM | POA: Diagnosis not present

## 2020-04-22 DIAGNOSIS — Z87891 Personal history of nicotine dependence: Secondary | ICD-10-CM | POA: Insufficient documentation

## 2020-04-22 DIAGNOSIS — I130 Hypertensive heart and chronic kidney disease with heart failure and stage 1 through stage 4 chronic kidney disease, or unspecified chronic kidney disease: Secondary | ICD-10-CM | POA: Diagnosis not present

## 2020-04-22 DIAGNOSIS — Z7982 Long term (current) use of aspirin: Secondary | ICD-10-CM | POA: Diagnosis not present

## 2020-04-22 DIAGNOSIS — I2721 Secondary pulmonary arterial hypertension: Secondary | ICD-10-CM

## 2020-04-22 DIAGNOSIS — Z7951 Long term (current) use of inhaled steroids: Secondary | ICD-10-CM | POA: Insufficient documentation

## 2020-04-22 DIAGNOSIS — Z95810 Presence of automatic (implantable) cardiac defibrillator: Secondary | ICD-10-CM | POA: Diagnosis not present

## 2020-04-22 DIAGNOSIS — I5022 Chronic systolic (congestive) heart failure: Secondary | ICD-10-CM | POA: Insufficient documentation

## 2020-04-22 DIAGNOSIS — Z8673 Personal history of transient ischemic attack (TIA), and cerebral infarction without residual deficits: Secondary | ICD-10-CM | POA: Insufficient documentation

## 2020-04-22 DIAGNOSIS — J449 Chronic obstructive pulmonary disease, unspecified: Secondary | ICD-10-CM | POA: Diagnosis not present

## 2020-04-22 DIAGNOSIS — E1142 Type 2 diabetes mellitus with diabetic polyneuropathy: Secondary | ICD-10-CM | POA: Insufficient documentation

## 2020-04-22 DIAGNOSIS — E785 Hyperlipidemia, unspecified: Secondary | ICD-10-CM | POA: Diagnosis not present

## 2020-04-22 DIAGNOSIS — Z791 Long term (current) use of non-steroidal anti-inflammatories (NSAID): Secondary | ICD-10-CM | POA: Diagnosis not present

## 2020-04-22 DIAGNOSIS — N184 Chronic kidney disease, stage 4 (severe): Secondary | ICD-10-CM | POA: Insufficient documentation

## 2020-04-22 DIAGNOSIS — E1122 Type 2 diabetes mellitus with diabetic chronic kidney disease: Secondary | ICD-10-CM | POA: Insufficient documentation

## 2020-04-22 DIAGNOSIS — Z9981 Dependence on supplemental oxygen: Secondary | ICD-10-CM | POA: Insufficient documentation

## 2020-04-22 DIAGNOSIS — Z8249 Family history of ischemic heart disease and other diseases of the circulatory system: Secondary | ICD-10-CM | POA: Insufficient documentation

## 2020-04-22 DIAGNOSIS — Z79899 Other long term (current) drug therapy: Secondary | ICD-10-CM | POA: Insufficient documentation

## 2020-04-22 DIAGNOSIS — I428 Other cardiomyopathies: Secondary | ICD-10-CM | POA: Insufficient documentation

## 2020-04-22 DIAGNOSIS — G4733 Obstructive sleep apnea (adult) (pediatric): Secondary | ICD-10-CM | POA: Insufficient documentation

## 2020-04-22 DIAGNOSIS — Z9989 Dependence on other enabling machines and devices: Secondary | ICD-10-CM | POA: Insufficient documentation

## 2020-04-22 DIAGNOSIS — E1136 Type 2 diabetes mellitus with diabetic cataract: Secondary | ICD-10-CM | POA: Insufficient documentation

## 2020-04-22 DIAGNOSIS — J9612 Chronic respiratory failure with hypercapnia: Secondary | ICD-10-CM | POA: Insufficient documentation

## 2020-04-22 DIAGNOSIS — I272 Pulmonary hypertension, unspecified: Secondary | ICD-10-CM | POA: Insufficient documentation

## 2020-04-22 NOTE — Progress Notes (Signed)
PCP: Harland Dingwall NP-C  HF Cardiologist: Dr Haroldine Laws Cardiology: Dr Loleta Chance Nephrology: Dr Resa Miner   HPI: 79 y/o AAF w/ a long standing history of chronic systolic heart failure/ NICM and pulmonary hypertension. Per records, her systolic HF dates back to 2006. Mild nonobstructive CAD by LHC in 2006 and again in 2015. She is s/p Metronic ICD, also w/ restrictive lung disease, OSA on CPAP, essential hypertension, CKD, HLD and T2DM. Her daughter is a Therapist, sports for [employer].   Most recent echo prior to this admit was in 2016 and showed LVEF 45% w/ mild AS, normal RV but severe pulmonary HTN, PASP 70. She has been followed by Dr. Halford Chessman for restrictive lung disease. Previous PFTs(2018)showed no obstruction but fairly significant restriction (65% of predicted)and markedly reduced DLCO(39%, corrected for alveolar volume).  She was admitted 02/11/20 for acute on chronic hypoxic respiratory failure and acute on chronic systolic heart failure w/ volume overload. Hypoxic in the 80s on arrival and required NRB. LEE up to thighs. BNP 617. CXR w/ cardiomegaly, but no overt edema and no infiltrate or effusions. D-dimer elevated at 1.07 but V/Q scan negative for PE. She was started on IV diuretics and diuresed. Echo showed LVEF 45-50% w/ global hypokinesis. RV severely enlarged w/ moderately elevated pulmonary artery systolic pressure, 97.3 mmHg, + mod-severe TR. RHC showed severe PAH w/ normal left-sided pressures. CO/CI normal.  Admitted 11/3297 with A/C Systolic HF. Diuresed with IV lasix and transitioned to torsemide 60 mg daily. Creatinine peaked at 3.42. Renal US was negative. Sildenafil and amlodipine both stopped due to hypotension. Discharged 03/07/2020.   On 04/08/20 Weight at home went down to 140. Dr Orene Desanctis  instructed to change torsemide to 20 mg as needed for weight 150 -152  Pounds.   Saw Nephrology last week. Instructed to take 5 mg amlodipine as needed for SBP > 180.    Today she returns  for HF follow up with his daughter. Overall feeling fine. Does have some shortness of breath with exertion. Denies PND/Orthopnea. Appetite ok. No fever or chills. Wears oxygen if oxygen saturation is < 90%. Using CPAP at night.   Taking all medications. Usually taking 20 mg torsemide ~ 3 days a week.  Lives alone. Has lots of support from her family.   RHC Findings 02/15/20: RA = 20 RV = 72/27 PA = 79/44 (53) PCW = 18 Fick cardiac output/index = 5.35/2.9 Thermo CO/CI = 4.4/2.4 PVR = 6.5 (Fick) 8.0 (Thermo) Ao sat = 94% PA sat = 64%, 64% SVC sat = 61% Assessment: 1. Severe PAH with normal left-sided pressures Plan/Discussion: There is near equalization or diastolic pressures which can reflect restrictive physiology or elevated R-sided pressure with normal left pressures. Given relatively normal PCWP - restriction felt to be less likely.   ROS: All systems negative except as listed in HPI, PMH and Problem List.  SH:  Social History   Socioeconomic History  . Marital status: Widowed    Spouse name: Not on file  . Number of children: Not on file  . Years of education: Not on file  . Highest education level: Not on file  Occupational History  . Not on file  Tobacco Use  . Smoking status: Former Smoker    Quit date: 03/14/2009    Years since quitting: 11.1  . Smokeless tobacco: Never Used  . Tobacco comment: quit smoking 2010  Vaping Use  . Vaping Use: Never used  Substance and Sexual Activity  . Alcohol use: No  .  Drug use: No  . Sexual activity: Not on file  Other Topics Concern  . Not on file  Social History Narrative   Lives alone.  2 daughters and 1 granddaughter in [place]   Social Determinants of Health   Financial Resource Strain:   . Difficulty of Paying Living Expenses: Not on file  Food Insecurity: No Food Insecurity  . Worried About Charity fundraiser in the Last Year: Never true  . Ran Out of Food in the Last Year: Never true  Transportation Needs: No  Transportation Needs  . Lack of Transportation (Medical): No  . Lack of Transportation (Non-Medical): No  Physical Activity:   . Days of Exercise per Week: Not on file  . Minutes of Exercise per Session: Not on file  Stress: No Stress Concern Present  . Feeling of Stress : Not at all  Social Connections: Moderately Integrated  . Frequency of Communication with Friends and Family: More than three times a week  . Frequency of Social Gatherings with Friends and Family: More than three times a week  . Attends Religious Services: More than 4 times per year  . Active Member of Clubs or Organizations: Yes  . Attends Archivist Meetings: 1 to 4 times per year  . Marital Status: Widowed  Intimate Partner Violence:   . Fear of Current or Ex-Partner: Not on file  . Emotionally Abused: Not on file  . Physically Abused: Not on file  . Sexually Abused: Not on file    FH:  Family History  Problem Relation Age of Onset  . Colon polyps Daughter   . Hypertension Daughter   . Hypertension Mother   . Stroke Mother   . Hypertension Father   . Diabetes Brother   . Hypertension Brother   . Hypertension Daughter   . Colon cancer Neg Hx   . Esophageal cancer Neg Hx   . Rectal cancer Neg Hx   . Stomach cancer Neg Hx     Past Medical History:  Diagnosis Date  . Anemia   . CAD (coronary artery disease)   . Cardiac defibrillator in situ   . Cataract    bilateral 2017  . CHF (congestive heart failure) ([place])   . Chronic systolic heart failure ([place])   . CKD (chronic kidney disease) stage 4, GFR 15-29 ml/min (HCC)   . COPD (chronic obstructive pulmonary disease) ([place])   . Dyspnea   . HTN (hypertension)   . 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 Rollene Rotunda in [place]. Per notes she was due for colonoscopy in 12/2016  . OSA on CPAP 09/08/2017  . Other cardiomyopathies ([place])   . Oxygen deficiency    2 liters at night  . Pulmonary HTN ([place])   . Sleep apnea    uses c-pap  .  Stroke ([place])   . Type 2 diabetes, controlled, with peripheral neuropathy ([place])   . Urge incontinence of urine     Current Outpatient Medications  Medication Sig Dispense Refill  . ACCU-CHEK AVIVA PLUS test strip USE  TO TEST BLOOD SUGAR ONE TIME DAILY (Patient taking differently: 1 each by Other route as needed (To test blood sugar). ) 100 strip 1  . Accu-Chek Softclix Lancets lancets USE  AS  INSTRUCTED (Patient taking differently: 1 each by Other route as needed. ) 100 each 2  . Alcohol Swabs (B-D SINGLE USE SWABS REGULAR) PADS USE  TO TEST BLOOD SUGAR ONE TIME DAILY (Patient taking differently:  1 each by Other route as needed (Clean skin before blood sugar reading). ) 100 each 1  . aspirin 81 MG tablet Take 81 mg by mouth at bedtime.     [name] atorvastatin (LIPITOR) 40 MG tablet TAKE 1 TABLET EVERY DAY (Patient taking differently: Take 40 mg by mouth daily. ) 90 tablet 0  . budesonide (PULMICORT) 0.5 MG/2ML nebulizer solution Take 2 mLs (0.5 mg total) by nebulization 2 (two) times daily. (Patient taking differently: Take 0.5 mg by nebulization daily. ) 60 mL 11  . carvedilol (COREG) 3.125 MG tablet Take 1 tablet (3.125 mg total) by mouth 2 (two) times daily with a meal. 60 tablet 2  . cholecalciferol (VITAMIN D3) 25 MCG (1000 UNIT) tablet Take 1,000 Units by mouth daily.    . ferrous sulfate 325 (65 FE) MG tablet Take 325 mg by mouth daily.     [name] ipratropium-albuterol (DUONEB) 0.5-2.5 (3) MG/3ML SOLN Take 3 mLs by nebulization every 6 (six) hours as needed (wheezing, SOB). 90 mL 11  . MYRBETRIQ 50 MG TB24 tablet TAKE 1 TABLET EVERY DAY (Patient taking differently: Take 50 mg by mouth daily as needed (overactive bladder). ) 90 tablet 0  . NONFORMULARY OR COMPOUNDED ITEM Antifungal solution: Terbinafine 3%, Fluconazole 2%, Tea Tree Oil 5%, Urea 10%, Ibuprofen 2% in DMSO suspension #61m 1 each 3  . OXYGEN Inhale 2 L into the lungs continuous.     . polyethylene glycol (MIRALAX / GLYCOLAX) 17 g  packet Take 17 g by mouth daily as needed for moderate constipation. 14 each 0  . potassium chloride (KLOR-CON) 10 MEQ tablet TAKE 2 TABLETS(20 MEQ) BY MOUTH DAILY 180 tablet 0  . torsemide (DEMADEX) 20 MG tablet Take 3 tablets (60 mg total) by mouth daily. (Patient taking differently: Take 60 mg by mouth as needed. Based on weight.) 90 tablet 2   No current facility-administered medications for this encounter.    Vitals:   04/22/20 1454  BP: 140/72  Pulse: 83  SpO2: 93%  Weight: 71.5 kg   Wt Readings from Last 3 Encounters:  04/22/20 71.5 kg  04/10/20 70.1 kg  04/06/20 55.5 kg   Reds Clip 38 %   PHYSICAL EXAM: General:  Arrived in wheel chair.  No resp difficulty HEENT: normal Neck: supple. JVP 11-12 Carotids 2+ bilaterally; no bruits. No lymphadenopathy or thryomegaly appreciated. Cor: PMI normal. Regular rate & rhythm. No rubs, gallops or murmurs. Lungs: clear on 2 liters [place].  Abdomen: soft, nontender, nondistended. No hepatosplenomegaly. No bruits or masses. Good bowel sounds. Extremities: no cyanosis, clubbing, rash, edema Neuro: alert & orientedx3, cranial nerves grossly intact. Moves all 4 extremities w/o difficulty. Affect pleasant.  ASSESSMENT & PLAN: 1. PAH - RHC 01/2020 demonstrated severe PAH w/ normal left-sided pressures. PA = 79/44 (53). CO/CI normal. 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  - - Continue CPAP qhs - Sildenafil stopped during the August hospitalization due to hypotension.  --Needs to use oxygen all the time. Oxygen saturation 87% on arrrival today.  2. Chronic Systolic Heart Failure w/ Predominant RV Failure - long standing history of chronic systolic heart failure/ NICM and pulmonary hypertension. Per records, her systolic HF dates back to 2006. Mild nonobstructive CAD by LHC in 2006 and again in 2015.  - s/p Medtronic ICD  - Echo 01/2020 EF 45-50%. RV severely enlarged w/ moderately reduced  systolic function 2/2 PH - Volume status mildly elevated. Reds Clip 38%.  I think we need to lower dry weight range down to 149-151 pounds. She would then take 20 mg torsemide. Plan to recheck in 4 weeks or sooner to see if we can stabilize and obtain lower Reds Clip.  3. Chronic hypoxic/hypercapnic respiratory failure - multifactorial, 2/2 PAH, a/c CHF and restrictive lung disease  - supp O2, SpO2 goal >90% - continue pulmicort, duoneb - CPAP qhs - Needs to wear oxygen 2 liters continuously.  4. CKD Stage IIIb.  Had labs earlier last week. We will request labs from Dr Resa Miner.  5. HTN  Stable today. Allowing to run high per Nephrology.  6. T2DM Per PCP  7. OSA - continue CPAP qhs   Follow up in 4 weeks with Dr Haroldine Laws.   Kyian Obst NP-C  4:41 PM

## 2020-04-22 NOTE — Patient Instructions (Signed)
TAKE Torsemide 20 mg, as needed for weights 149-151 lbs  Your physician recommends that you schedule a follow-up appointment in: 4 weeks  in the Advanced Practitioners (PA/NP) Clinic    If you have any questions or concerns before your next appointment please send [place] a message through [place] or call our office at [phone].    TO LEAVE A MESSAGE FOR THE NURSE SELECT OPTION 2, PLEASE LEAVE A MESSAGE INCLUDING: . YOUR NAME . DATE OF BIRTH . CALL BACK NUMBER . REASON FOR CALL**this is important as we prioritize the call backs  YOU WILL RECEIVE A CALL BACK THE SAME DAY AS LONG AS YOU CALL BEFORE 4:00 PM

## 2020-04-22 NOTE — Progress Notes (Signed)
ReDS Vest / Clip - 04/22/20 1500      ReDS Vest / Clip   Station Marker A    Ruler Value 27    ReDS Value Range Moderate volume overload    ReDS Actual Value 38    Anatomical Comments sitting

## 2020-04-23 ENCOUNTER — Other Ambulatory Visit: Payer: Self-pay

## 2020-04-23 NOTE — Patient Outreach (Signed)
[facility]) Care Management  [place]  04/23/2020   [name] 30-May-1941 [id number]  Subjective: Telephone call to patient. She reports doing pretty good.  Has new medication and weight parameters for heart failure.  Discussed with patient heart failure and ongoing work to monitor and control. She verbalized understanding. Patient reports she will see pulmonology for results of her sleep study.  Family remains active in her care.  Patient declines any concerns.    Objective:   Encounter Medications:  Outpatient Encounter Medications as of 04/23/2020  Medication Sig  . ACCU-CHEK AVIVA PLUS test strip USE  TO TEST BLOOD SUGAR ONE TIME DAILY (Patient taking differently: 1 each by Other route as needed (To test blood sugar). )  . Accu-Chek Softclix Lancets lancets USE  AS  INSTRUCTED (Patient taking differently: 1 each by Other route as needed. )  . Alcohol Swabs (B-D SINGLE USE SWABS REGULAR) PADS USE  TO TEST BLOOD SUGAR ONE TIME DAILY (Patient taking differently: 1 each by Other route as needed (Clean skin before blood sugar reading). )  . aspirin 81 MG tablet Take 81 mg by mouth at bedtime.   [name] atorvastatin (LIPITOR) 40 MG tablet Take 40 mg by mouth daily.  . budesonide (PULMICORT) 0.5 MG/2ML nebulizer solution Take 0.5 mg by nebulization daily.  . carvedilol (COREG) 3.125 MG tablet Take 1 tablet (3.125 mg total) by mouth 2 (two) times daily with a meal.  . cholecalciferol (VITAMIN D3) 25 MCG (1000 UNIT) tablet Take 1,000 Units by mouth as directed. M-W-F  . ferrous sulfate 325 (65 FE) MG tablet Take 325 mg by mouth as directed. M-W-F  . ipratropium-albuterol (DUONEB) 0.5-2.5 (3) MG/3ML SOLN Take 3 mLs by nebulization every 6 (six) hours as needed (wheezing, SOB).  . mirabegron ER (MYRBETRIQ) 50 MG TB24 tablet Take 50 mg by mouth as needed. Overactive bladder  . NONFORMULARY OR COMPOUNDED ITEM Antifungal solution: Terbinafine 3%, Fluconazole 2%, Tea Tree Oil  5%, Urea 10%, Ibuprofen 2% in DMSO suspension #60m  . OXYGEN Inhale 2 L into the lungs continuous.   . polyethylene glycol (MIRALAX / GLYCOLAX) 17 g packet Take 17 g by mouth daily as needed for moderate constipation.  . potassium chloride (KLOR-CON) 10 MEQ tablet TAKE 2 TABLETS(20 MEQ) BY MOUTH DAILY (Patient taking differently: Take 10 mEq by mouth as directed. Determined by weight)  . torsemide (DEMADEX) 20 MG tablet Take 60 mg by mouth as needed.   No facility-administered encounter medications on file as of 04/23/2020.    Functional Status:  In your present state of health, do you have any difficulty performing the following activities: 04/10/2020 04/08/2020  Hearing? N N  Vision? N N  Difficulty concentrating or making decisions? N N  Walking or climbing stairs? Y Y  Comment - needs assist  Dressing or bathing? N Y  Comment - family assist  Doing errands, shopping? YTempie Donning Comment - family assists  Preparing Food and eating ? N N  Using the Toilet? Y N  In the past six months, have you accidently leaked urine? Y Y  Comment - incontinence  Do you have problems with loss of bowel control? Y N  Managing your Medications? N Y  Comment - family manages  Managing your Finances? N Y  Comment - family manages  Housekeeping or managing your Housekeeping? N Y  Comment - family helps  Some recent data might be hidden    Fall/Depression Screening: Fall Risk  04/10/2020 04/08/2020 03/11/2020  Falls in the past year? 0 0 0  Number falls in past yr: 0 - -  Injury with Fall? 0 - -   PHQ 2/9 Scores 04/10/2020 04/08/2020 03/11/2020 10/04/2019 04/04/2019 01/27/2018 09/01/2017  PHQ - 2 Score 0 0 0 0 0 0 0    Assessment: Patient managing heart failure and other chronic conditions and benefits from disease management outreach.   Goals Addressed            This Visit's Progress   . Track and Manage Fluids and Swelling       Follow Up Date 06/18/20   - track weight in diary - use salt in  moderation - watch for swelling in feet, ankles and legs every day - weigh myself daily    Why is this important?   It is important to check your weight daily and watch how much salt and liquids you have.  It will help you to manage your heart failure.    Notes: Patient reports that if her weight is over 149 to 151 lbs she is to take a fluid pill.  Weight this AM 154 lbs. Patient took fluid pill per order today. Encouraged to continue to monitor weights and limit salt intake and salty food items.      . Track and Manage Symptoms         Plan: RN CM will contact next month and patient agreeable.    [name], RN, MSN [place] Management Care Management Coordinator Direct Line [phone] Cell [phone] Toll Free: [phone]  Fax: [phone]

## 2020-04-24 ENCOUNTER — Encounter: Payer: Self-pay | Admitting: Acute Care

## 2020-04-24 ENCOUNTER — Other Ambulatory Visit: Payer: Self-pay

## 2020-04-24 ENCOUNTER — Ambulatory Visit: Payer: Medicare PPO | Admitting: Acute Care

## 2020-04-24 VITALS — BP 130/70 | HR 76 | Temp 97.2°F | Ht 63.0 in | Wt 156.4 lb

## 2020-04-24 DIAGNOSIS — I5023 Acute on chronic systolic (congestive) heart failure: Secondary | ICD-10-CM

## 2020-04-24 DIAGNOSIS — G4733 Obstructive sleep apnea (adult) (pediatric): Secondary | ICD-10-CM

## 2020-04-24 DIAGNOSIS — J984 Other disorders of lung: Secondary | ICD-10-CM | POA: Diagnosis not present

## 2020-04-24 DIAGNOSIS — I2721 Secondary pulmonary arterial hypertension: Secondary | ICD-10-CM | POA: Diagnosis not present

## 2020-04-24 DIAGNOSIS — Z9989 Dependence on other enabling machines and devices: Secondary | ICD-10-CM | POA: Diagnosis not present

## 2020-04-24 DIAGNOSIS — J9621 Acute and chronic respiratory failure with hypoxia: Secondary | ICD-10-CM

## 2020-04-24 DIAGNOSIS — N184 Chronic kidney disease, stage 4 (severe): Secondary | ICD-10-CM | POA: Diagnosis not present

## 2020-04-24 MED ORDER — BUDESONIDE 0.5 MG/2ML IN SUSP: 0.5000 mg | Freq: Every day | RESPIRATORY_TRACT | 5 refills | Status: DC

## 2020-04-24 NOTE — Patient Instructions (Addendum)
It is good to see you today. Your Sleep Study shows that you have mild obstructive sleep apnea with drop in your oxygen We will continue CPAP therapy as you have been doing Continue at Auto Set 5-20 cm Pressure Continue wearing with oxygen at 2 L  Continue on CPAP at bedtime. You appear to be benefiting from the treatment  Goal is to wear for at least 6 hours each night for maximal clinical benefit. Continue to work on weight loss, as the link between excess weight  and sleep apnea is well established.   Remember to establish a good bedtime routine, and work on sleep hygiene.  Limit daytime naps , avoid stimulants such as caffeine and nicotine close to bedtime, exercise daily to promote sleep quality, avoid heavy , spicy, fried , or rich foods before bed. Ensure adequate exposure to natural light during the day,establish a relaxing bedtime routine with a pleasant sleep environment ( Bedroom between 60 and 67 degrees, turn off bright lights , TV or device screens screens , consider black out curtains or white noise machines) Do not drive if sleepy. Remember to clean mask, tubing, filter, and reservoir once weekly with soapy water.  Follow up with Dr. Halford Chessman or Judson Roch NP in 3 months  or before as needed.  Please contact office for sooner follow up if symptoms do not improve or worsen or seek emergency care   We will send a copy of your sleep study to your DME, as they need this for reimbursement.   OK to use Pulmicort once daily as long as you are doing ok. If you have a bad day, use it twice daily, call if you do not feel better. Call when you are ready to do a walk in [place] POC

## 2020-04-24 NOTE — Progress Notes (Addendum)
History of Present Illness [name] is a 79 y.o. female former smoker with OSA on CPAP, emphysema, and PAH. She also has heart failure , and is followed by the heart failure clinic. She is followed by Dr. Halford Chessman   Additional medical history: chronic combined systolic/diastolic CHF, CKD stage IV, hypertension, OSA on CPAP, diabetes type 2 , PAH  Pt has had 2 recent hospitalizations for Acute on Chronic Respiratory Failure 02/11/2020, (Multifactorial>>Underlying CHF, severe pulmonary hypertension,COPD and also possible restrictive lung disease )   and again 03/03/2020 for similar issues with CHF, A on C Resp failure and  hypotension in setting or diuretic use.  She was discharged home on a loaner CPAP machine. Additionally she is now using her oxygen at 2 L [place] 24/7.   04/24/2020  Pt. presents for follow up.She was recently seen by Dr. Halford Chessman 02/02/2020 for shortness of breath with exertion , and a CPAP machine that had been broken x 3 months. She then had 2 admissions to the hospital as noted above. Primary recurring issues were  We have reviewed her  Sleep Study results. She has received her CPAP machine as a loaner upon discharge from the hospital. .   Using her Pulmicort once daily We will increase to BID if she is flaring  Please fax this OV result to [facility] want her to wear her oxygen day and night   Test Results: Down Load 04/24/2020        PSG 04/05/20 >> AHI 4.7, RDI 6.4, REM AHI 21.3, SpO2 low 83%.  Used 2 liters oxygen.  PSG 10/02/13 >> AHI 59.6, SpO2 low 80%  PSG 07/05/14 >> AHI 15.1, SpO2 low 76%  CPAP 01/06/18 to 04/05/18 >>used on 90 of 90 nights with average 9 hrs 9 min. Average AHI 8.2 with CPAP 18 cm H2O.   PFT 06/28/07 >> FEV1 0.73 (39%), FEV1% 92, TLC 2.41 (51%)  PFT 05/04/17 >> FEV1 1.22 (79%), FEV1% 69, TLC 3.12 (75%), DLCO 39%, +BD   Echo 03/05/2020>>: ejection fraction of 45 to 50%, global hypokinesis, severely reduced right ventricular systolic  function, elevated pulmonary artery systolic pressure. Findings consistent with cor pulmonale Echo 05/13/15 >> EF 45%, PAS 70 mmHg  Right Heart Cath 02/15/2020>>  RA = 20 RV = 72/27 PA = 79/44 (53) PCW = 18 Fick cardiac output/index = 5.35/2.9 Thermo CO/CI = 4.4/2.4 PVR = 6.5 (Fick) 8.0 (Thermo) Ao sat = 94% PA sat = 64%, 64% SVC sat = 61%     CBC Latest Ref Rng & Units 03/13/2020 03/05/2020 03/03/2020  WBC 3.4 - 10.8 x10E3/uL 3.3(L) 3.7(L) 4.0  Hemoglobin 11.1 - 15.9 g/dL 11.6 9.4(L) 9.4(L)  Hematocrit 34.0 - 46.6 % 36.7 31.5(L) 31.8(L)  Platelets 150 - 450 x10E3/uL 257 220 226    BMP Latest Ref Rng & Units 03/13/2020 03/07/2020 03/06/2020  Glucose 65 - 99 mg/dL 127(H) 93 141(H)  BUN 8 - 27 mg/dL 63(H) 54(H) 52(H)  Creatinine 0.57 - 1.00 mg/dL 2.38(H) 2.48(H) 2.40(H)  BUN/Creat Ratio 12 - 28 26 - -  Sodium 134 - 144 mmol/L 144 139 138  Potassium 3.5 - 5.2 mmol/L 4.9 4.2 4.0  Chloride 96 - 106 mmol/L 98 99 98  CO2 20 - 29 mmol/L 31(H) 29 30  Calcium 8.7 - 10.3 mg/dL 9.8 9.1 9.2    BNP    Component Value Date/Time   BNP 466.5 (H) 03/04/2020 0940    ProBNP    Component Value Date/Time  PROBNP 847.0 (H) 07/03/2007 0550    PFT No results found for: FEV1PRE, FEV1POST, FVCPRE, FVCPOST, TLC, DLCOUNC, PREFEV1FVCRT, PSTFEV1FVCRT  SLEEP STUDY DOCUMENTS  Result Date: 04/10/2020 Ordered by an unspecified provider.    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 Rollene Rotunda in [place]. Per notes she was due for colonoscopy in 12/2016   OSA on CPAP 09/08/2017    Other cardiomyopathies ([place])    Oxygen deficiency    2 liters at night   Pulmonary HTN (HCC)    Sleep apnea    uses c-pap   Stroke [facility])    Type 2 diabetes, controlled, with peripheral neuropathy (HCC)    Urge incontinence of urine      Social History   Tobacco Use   Smoking status: Former Smoker    Quit date: 03/14/2009    Years since quitting: 11.1   Smokeless tobacco: Never Used   Tobacco comment: quit smoking 2010  Vaping Use   Vaping Use: Never used  Substance Use Topics   Alcohol use: No   Drug use: No    Ms.Krolikowski reports that she quit smoking about 11 years ago. She has never used smokeless tobacco. She reports that she does not drink alcohol and does not use drugs.  Tobacco Cessation: Former smoker quit 2010  Past surgical hx, Family hx, Social hx all reviewed.  Current Outpatient Medications on File Prior to Visit  Medication Sig   ACCU-CHEK AVIVA PLUS test strip USE  TO TEST BLOOD SUGAR ONE TIME DAILY (Patient taking differently: 1 each by Other route as needed (To test blood sugar). )   Accu-Chek Softclix Lancets lancets USE  AS  INSTRUCTED (Patient taking differently: 1 each by Other route as needed. )   Alcohol Swabs (B-D SINGLE USE SWABS REGULAR) PADS USE  TO TEST BLOOD SUGAR ONE TIME DAILY (Patient taking differently: 1 each by Other route as needed (Clean skin before blood sugar reading). )   aspirin 81 MG tablet Take 81 mg by mouth at bedtime.    atorvastatin (LIPITOR) 40 MG tablet Take 40 mg by mouth daily.   carvedilol (COREG) 3.125 MG tablet Take 1 tablet (3.125 mg total) by mouth 2 (two) times daily with a meal.   cholecalciferol (VITAMIN D3) 25 MCG (1000 UNIT) tablet Take 1,000 Units by mouth as directed. M-W-F   ferrous sulfate 325 (65 FE) MG tablet Take 325 mg by mouth as directed. M-W-F   ipratropium-albuterol (DUONEB) 0.5-2.5 (3) MG/3ML SOLN Take 3 mLs by nebulization every 6 (six) hours as needed (wheezing, SOB).    mirabegron ER (MYRBETRIQ) 50 MG TB24 tablet Take 50 mg by mouth as needed. Overactive bladder   NONFORMULARY OR COMPOUNDED ITEM Antifungal solution: Terbinafine 3%, Fluconazole 2%, Tea Tree Oil 5%, Urea 10%, Ibuprofen 2% in DMSO suspension #49m   OXYGEN Inhale 2 L into the lungs continuous.    polyethylene glycol (MIRALAX / GLYCOLAX) 17 g packet Take 17 g by mouth daily as needed for moderate constipation.  potassium chloride (KLOR-CON) 10 MEQ tablet TAKE 2 TABLETS(20 MEQ) BY MOUTH DAILY (Patient taking differently: Take 10 mEq by mouth as directed. Determined by weight)   torsemide (DEMADEX) 20 MG tablet Take 60 mg by mouth as needed.   No current facility-administered medications on file prior to visit.     Allergies  Allergen Reactions   Benazepril Hcl Other (See Comments)    Unknown   Lotrel [Amlodipine Besy-Benazepril Hcl] Other (See Comments)    Hypotension    Talwin [Pentazocine] Other (See Comments)    Hallucinations      Review Of Systems:  Constitutional:   No  weight loss, night sweats,  Fevers, chills, fatigue, or  lassitude.  HEENT:   No headaches,  Difficulty swallowing,  Tooth/dental problems, or  Sore throat,                No sneezing, itching, ear ache, nasal congestion, post nasal drip,   CV:  No chest pain,  Orthopnea, PND, + swelling in lower extremities, No anasarca, dizziness, palpitations, syncope.   GI  No heartburn, indigestion, abdominal pain, nausea, vomiting, diarrhea, change in bowel habits, loss of appetite, bloody stools.   Resp: + shortness of breath with exertion or at rest.  No excess mucus, no productive cough,  No non-productive cough,  No coughing up of blood.  No change in color of mucus.  No wheezing.  No chest wall deformity  Skin: no rash or lesions.  GU: no dysuria, change in color of urine, no urgency or frequency.  No flank pain, no hematuria   MS:  No joint pain or swelling.  No decreased range of motion.  No back  pain.  Psych:  No change in mood or affect. No depression or anxiety.  No memory loss.   Vital Signs BP 130/70 (BP Location: Left Arm, Cuff Size: Normal)    Pulse 76    Temp (!) 97.2 F (36.2 C) (Oral)    Ht _0  (1.6 m)    Wt 156 lb 6.4 oz (70.9 kg)    SpO2 99%    BMI 27.71 kg/m    Physical Exam:  General- No distress,  A&Ox3, pleasant ENT: No sinus tenderness, TM clear, pale nasal mucosa, no oral exudate,no post nasal drip, no LAN Cardiac: S1, S2, regular rate and rhythm, no murmur Chest: No wheeze/ rales/ dullness; no accessory muscle use, no nasal flaring, no sternal retractions Abd.: Soft Non-tender, ND, BS +, Body mass index is 27.71 kg/m. Ext: No clubbing cyanosis, 1+ BLE edema Neuro:  Deconditioned at baseline Skin: No rashes, No lesions, warm and dry Psych: normal mood and behavior   Assessment/Plan OSA on CPAP Severe PAH Per Sleep Study mild OSA with desaturations  Plan Continue CPAP night as you have been doing Auto Set 5-20 cm Pressure Continue using home oxygen with CPAP without fail Follow up with Dr. Halford Chessman in 3 months  Acute on chronic respiratory failure with hypoxia secondary to systolic congestive heart failure: Plan Continue wearing oxygen at 2 L [place] , 24 hours a day, seven days a week Saturation goals are > 88% Lasix per heart Failure clinic Continue Pulmicort nebs as you have been doing Remember you have Duonebs also if you experience worsening shortness of breath  Stage IV chronic kidney disease Suspect cardio renal  Plan [facility]     PFT 06/28/07 >> FEV1 0.73 (39%), FEV1% 92, TLC 2.41 (51%)  PFT 05/04/17 >>  FEV1 1.22 (79%), FEV1% 69, TLC 3.12 (75%), DLCO 39%, +BD  Restrictive Lung Disease with decreased DLCO Plan Continue nebs as we discussed Pulmicort and Duonebs Consider PFT's at follow up  Pulmonary Artery Hypertension Secondary to restrictive lung disease and OSA  LV ejection  fraction of 45 to 50 % with moderately reduced right ventricular systolic function on Echo  03/05/2020 Plan Sildenefil was discontinued at last hospitalization 02/2020 Diuretics per heart failure clinic Wear oxygen at 2 L  without fail Saturation goals > 88%  This appointment was 30 min long with over 50% of the time in direct face-to-face patient care, assessment, plan of care, and follow-up.  Magdalen Spatz, NP 04/24/2020  3:33 PM

## 2020-04-25 ENCOUNTER — Encounter: Payer: Self-pay | Admitting: Acute Care

## 2020-04-25 NOTE — Progress Notes (Signed)
Reviewed and agree with assessment/plan.   [name], MD [facility] Pulmonary/Critical Care 04/25/2020, 12:56 PM Pager:  [phone]

## 2020-04-30 ENCOUNTER — Telehealth: Payer: Self-pay | Admitting: Family Medicine

## 2020-04-30 ENCOUNTER — Other Ambulatory Visit: Payer: Self-pay

## 2020-04-30 ENCOUNTER — Ambulatory Visit: Payer: Medicare PPO

## 2020-04-30 NOTE — Telephone Encounter (Signed)
[facility] pharmacy sent refill request come in for  METOLAZONE 2.5 POTASSIUM CHLORIDE ER TORESMIDE  Please send to the [place], [place]

## 2020-05-01 ENCOUNTER — Other Ambulatory Visit: Payer: Self-pay | Admitting: Acute Care

## 2020-05-01 ENCOUNTER — Other Ambulatory Visit: Payer: Self-pay | Admitting: Family Medicine

## 2020-05-01 DIAGNOSIS — R0609 Other forms of dyspnea: Secondary | ICD-10-CM

## 2020-05-01 MED ORDER — BUDESONIDE 0.25 MG/2ML IN SUSP: 0.2500 mg | Freq: Two times a day (BID) | RESPIRATORY_TRACT | 11 refills | Status: DC

## 2020-05-01 NOTE — Telephone Encounter (Signed)
Pt was notified to contact cardiology for those meds to get refills

## 2020-05-01 NOTE — Telephone Encounter (Signed)
Are these ok to refill?

## 2020-05-01 NOTE — Telephone Encounter (Signed)
Pt was notified today that cardiac meds need to come from cardiology and she was ok with that

## 2020-05-01 NOTE — Telephone Encounter (Signed)
Also sent request for CARVEDILOL 3.125MG

## 2020-05-01 NOTE — Telephone Encounter (Signed)
I would prefer her cardiologist manage these. Please let her and her daughter know that would be best.

## 2020-05-07 DIAGNOSIS — I5089 Other heart failure: Secondary | ICD-10-CM | POA: Diagnosis not present

## 2020-05-07 DIAGNOSIS — G4733 Obstructive sleep apnea (adult) (pediatric): Secondary | ICD-10-CM | POA: Diagnosis not present

## 2020-05-07 DIAGNOSIS — I10 Essential (primary) hypertension: Secondary | ICD-10-CM | POA: Diagnosis not present

## 2020-05-07 DIAGNOSIS — J449 Chronic obstructive pulmonary disease, unspecified: Secondary | ICD-10-CM | POA: Diagnosis not present

## 2020-05-07 DIAGNOSIS — R531 Weakness: Secondary | ICD-10-CM | POA: Diagnosis not present

## 2020-05-08 NOTE — Progress Notes (Signed)
_0  ID: [name], female    DOB: 1940/09/08, 79 y.o.   MRN: [id number]  Chief Complaint  Patient presents with   Follow-up    Wears CPAP avg. 7-8 hrs.Doing good. Sob-same, no cough    Referring provider: Girtha Rm, NP-C  HPI: 79 year old female, former smoker. PMH significant for COPD/emphysema, OSA on CPAP, chronic respiratory failure, type 2 diabetes, CAD, CKD stage 4, pulmonary HTN, combined systolic and diastolic heart failure, malignant neoplasm rectum. Patient of Dr. Halford Chessman, last seen by pulmonary NP on 04/24/20.  05/09/2020 Patient presents today to re-qualify for oxygen. Accompanied by her daughter. No acute complaints. She is currently established with [employer] and looking to get a portable oxygen concentrator. She wears 2L oxygen for the most part 24/7. She monitors her pulse oximeter at home. Maintained on Pulmicort 0.26m/2ml nebulizer twice daily, she rarely requires ipratropium-albuterol nebulizers. She is compliant with CPAP use wearing on average 6-7 hours a night. Take 227mTorsemide as needed approx twice a week for weight gain. Her weight stays between 149-151lbs.     PSG 04/05/20 >>AHI 4.7, RDI 6.4, REM AHI 21.3, SpO2 low 83%. Used 2 liters oxygen.  PSG 10/02/13 >> AHI 59.6, SpO2 low 80%  PSG 07/05/14 >> AHI 15.1, SpO2 low 76%  CPAP 01/06/18 to 04/05/18 >>used on 90 of 90 nights with average 9 hrs 9 min. Average AHI 8.2 with CPAP 18 cm H2O.   PFT 06/28/07 >> FEV1 0.73 (39%), FEV1% 92, TLC 2.41 (51%)  PFT 05/04/17 >> FEV1 1.22 (79%), FEV1% 69, TLC 3.12 (75%), DLCO 39%, +BD   Echo 03/05/2020>>: ejection fraction of 45 to 50%, global hypokinesis, severely reduced right ventricular systolic function, elevated pulmonary artery systolic pressure. Findings consistent with cor pulmonale Echo 05/13/15 >> EF 45%, PAS 70 mmHg  Right Heart Cath 02/15/2020>>  RA = 20 RV = 72/27 PA = 79/44 (53) PCW = 18 Fick cardiac output/index = 5.35/2.9 Thermo CO/CI  = 4.4/2.4 PVR = 6.5 (Fick) 8.0 (Thermo) Ao sat = 94% PA sat = 64%, 64% SVC sat = 61%  Allergies  Allergen Reactions   Benazepril Hcl Other (See Comments)    Unknown   Lotrel [Amlodipine Besy-Benazepril Hcl] Other (See Comments)    Hypotension    Talwin [Pentazocine] Other (See Comments)    Hallucinations      Immunization History  Administered Date(s) Administered   Fluad Quad(high Dose 65+) 04/04/2019, 04/15/2020   Influenza, High Dose Seasonal PF 08/04/2016, 05/02/2017, 04/06/2018   Influenza-Unspecified 05/23/2013, 08/25/2013, 06/22/2014, 06/22/2014, 05/22/2015   PFIZER SARS-COV-2 Vaccination 09/12/2019, 10/03/2019, 04/30/2020   Pneumococcal Conjugate-13 06/22/2014   Pneumococcal-Unspecified 01/12/2008   Tdap 01/12/2008   Zoster 07/21/2011, 12/08/2011    Past Medical History:  Diagnosis Date   Anemia    CAD (coronary artery disease)    Cardiac defibrillator in situ    Cataract    bilateral 2017   CHF (congestive heart failure) (HCC)    Chronic systolic heart failure (HCC)    CKD (chronic kidney disease) stage 4, GFR 15-29 ml/min (HCC)    [place] disease) [place]   Dyspnea    HTN (hypertension)    [place] she [place] due for colonoscopy in 12/2016   OSA on CPAP 09/08/2017   Other cardiomyopathies ([place]   Oxygen deficiency    2 liters at night   Pulmonary [place]N ([facility]   Sleep  apnea    uses c-pap   Stroke [facility])    Type 2 diabetes, controlled, with peripheral neuropathy (HCC)    Urge incontinence of urine     Tobacco History: Social History   Tobacco Use  Smoking Status Former Smoker   Quit date: 03/14/2009   Years since quitting: 11.1  Smokeless Tobacco Never Used  Tobacco Comment   quit smoking 2010   Counseling given: Not Answered Comment: quit smoking 2010   Outpatient Medications Prior to Visit  Medication Sig  Dispense Refill   ACCU-CHEK AVIVA PLUS test strip USE  TO TEST BLOOD SUGAR ONE TIME DAILY (Patient taking differently: 1 each by Other route as needed (To test blood sugar). ) 100 strip 1   Accu-Chek Softclix Lancets lancets USE  AS  INSTRUCTED (Patient taking differently: 1 each by Other route as needed. ) 100 each 2   Alcohol Swabs (B-D SINGLE USE SWABS REGULAR) PADS USE  TO TEST BLOOD SUGAR ONE TIME DAILY (Patient taking differently: 1 each by Other route as needed (Clean skin before blood sugar reading). ) 100 each 1   aspirin 81 MG tablet Take 81 mg by mouth at bedtime.      atorvastatin (LIPITOR) 40 MG tablet TAKE 1 TABLET EVERY DAY 90 tablet 0   carvedilol (COREG) 3.125 MG tablet Take 1 tablet (3.125 mg total) by mouth 2 (two) times daily with a meal. 60 tablet 2   cholecalciferol (VITAMIN D3) 25 MCG (1000 UNIT) tablet Take 1,000 Units by mouth as directed. M-W-F     ferrous sulfate 325 (65 FE) MG tablet Take 325 mg by mouth as directed. M-W-F     ipratropium-albuterol (DUONEB) 0.5-2.5 (3) MG/3ML SOLN Take 3 mLs by nebulization every 6 (six) hours as needed (wheezing, SOB). 90 mL 11   mirabegron ER (MYRBETRIQ) 50 MG TB24 tablet Take 50 mg by mouth as needed. Overactive bladder     NONFORMULARY OR COMPOUNDED ITEM Antifungal solution: Terbinafine 3%, Fluconazole 2%, Tea Tree Oil 5%, Urea 10%, Ibuprofen 2% in DMSO suspension #78m 1 each 3   OXYGEN Inhale 2 L into the lungs continuous.      polyethylene glycol (MIRALAX / GLYCOLAX) 17 g packet Take 17 g by mouth daily as needed for moderate constipation. 14 each 0   potassium chloride (KLOR-CON) 10 MEQ tablet TAKE 2 TABLETS(20 MEQ) BY MOUTH DAILY (Patient taking differently: Take 10 mEq by mouth as directed. Determined by weight) 180 tablet 0   torsemide (DEMADEX) 20 MG tablet Take 60 mg by mouth as needed.     budesonide (PULMICORT) 0.25 MG/2ML nebulizer solution Take 2 mLs (0.25 mg total) by nebulization 2 (two) times daily. 120  mL 11   budesonide (PULMICORT) 0.5 MG/2ML nebulizer solution Take 2 mLs (0.5 mg total) by nebulization daily. (Patient not taking: Reported on 05/09/2020) 2 mL 5   No facility-administered medications prior to visit.   Review of Systems  Review of Systems  Constitutional: Negative.   Respiratory: Negative.   Cardiovascular: Negative.    Physical Exam  BP (!) 148/70 (BP Location: Left Arm, Cuff Size: Normal)    Pulse 65    Temp (!) 97.5 F (36.4 C) (Temporal)    Ht 5' 2" (1.575 m)    Wt 158 lb 3.2 oz (71.8 kg)    SpO2 100%    BMI 28.94 kg/m  Physical Exam Constitutional:      Appearance: Normal appearance.  HENT:     Mouth/Throat:  Mouth: Mucous membranes are moist.     Pharynx: Oropharynx is clear.  Cardiovascular:     Rate and Rhythm: Normal rate and regular rhythm.  Pulmonary:     Effort: Pulmonary effort is normal.     Breath sounds: Normal breath sounds. No wheezing or rhonchi.  Musculoskeletal:        General: Normal range of motion.  Skin:    General: Skin is warm and dry.  Neurological:     General: No focal deficit present.     Mental Status: She is alert and oriented to person, place, and time. Mental status is at baseline.  Psychiatric:        Mood and Affect: Mood normal.        Behavior: Behavior normal.        Thought Content: Thought content normal.        Judgment: Judgment normal.      Lab Results:  CBC    Component Value Date/Time   WBC 3.3 (L) 03/13/2020 1405   WBC 3.7 (L) 03/05/2020 0402   RBC 4.16 03/13/2020 1405   RBC 3.43 (L) 03/05/2020 0402   HGB 11.6 03/13/2020 1405   HCT 36.7 03/13/2020 1405   PLT 257 03/13/2020 1405   MCV 88 03/13/2020 1405   MCH 27.9 03/13/2020 1405   MCH 27.4 03/05/2020 0402   MCHC 31.6 03/13/2020 1405   MCHC 29.8 (L) 03/05/2020 0402   RDW 15.2 03/13/2020 1405   LYMPHSABS 0.7 03/13/2020 1405   MONOABS 0.6 03/05/2020 0402   EOSABS 0.2 03/13/2020 1405   BASOSABS 0.0 03/13/2020 1405    BMET     Component Value Date/Time   NA 144 03/13/2020 1405   K 4.9 03/13/2020 1405   CL 98 03/13/2020 1405   CO2 31 (H) 03/13/2020 1405   GLUCOSE 127 (H) 03/13/2020 1405   GLUCOSE 93 03/07/2020 0447   BUN 63 (H) 03/13/2020 1405   CREATININE 2.38 (H) 03/13/2020 1405   CALCIUM 9.8 03/13/2020 1405   GFRNONAA 19 (L) 03/13/2020 1405   GFRAA 21 (L) 03/13/2020 1405    BNP    Component Value Date/Time   BNP 466.5 (H) 03/04/2020 0940    ProBNP    Component Value Date/Time   PROBNP 847.0 (H) 07/03/2007 0550    Imaging: SLEEP STUDY DOCUMENTS  Result Date: 04/10/2020 Ordered by an unspecified provider.    Assessment & Plan:   COPD (chronic obstructive pulmonary disease) ([place])  - Stable interval; continues Pulmicort 0.82m/2ML twice daily  Chronic respiratory failure with hypoxia (HCC) - Ambulatory O2 low 87% RA after 1/2 lap, O2 95% on 2L continuous  - Re-qualified for oxygen, order best fit with [employer]   OSA on CPAP - Patient remains 100% complaint with CPAP use - Pressure 4-20cm h20; AHI 5.1  - No changes today - FU in 3 months    EMartyn Ehrich NP 05/09/2020

## 2020-05-09 ENCOUNTER — Ambulatory Visit: Payer: Medicare PPO | Admitting: Primary Care

## 2020-05-09 ENCOUNTER — Telehealth: Payer: Self-pay

## 2020-05-09 ENCOUNTER — Other Ambulatory Visit: Payer: Self-pay

## 2020-05-09 ENCOUNTER — Encounter: Payer: Self-pay | Admitting: Primary Care

## 2020-05-09 VITALS — BP 148/70 | HR 65 | Temp 97.5°F | Ht 62.0 in | Wt 158.2 lb

## 2020-05-09 DIAGNOSIS — G4733 Obstructive sleep apnea (adult) (pediatric): Secondary | ICD-10-CM

## 2020-05-09 DIAGNOSIS — Z9989 Dependence on other enabling machines and devices: Secondary | ICD-10-CM

## 2020-05-09 DIAGNOSIS — J9611 Chronic respiratory failure with hypoxia: Secondary | ICD-10-CM

## 2020-05-09 DIAGNOSIS — J449 Chronic obstructive pulmonary disease, unspecified: Secondary | ICD-10-CM

## 2020-05-09 NOTE — Progress Notes (Signed)
Reviewed and agree with assessment/plan.   [name], MD [facility] Pulmonary/Critical Care 05/09/2020, 7:15 PM Pager:  [phone]

## 2020-05-09 NOTE — Telephone Encounter (Signed)
Recv'd fax from [facility] order pt needs refill Carvedilol 3.125 for 90 days

## 2020-05-09 NOTE — Assessment & Plan Note (Signed)
-  Stable interval; continues Pulmicort 0.23m/2ML twice daily

## 2020-05-09 NOTE — Assessment & Plan Note (Addendum)
-  Ambulatory O2 low 87% RA after 1/2 lap, O2 95% on 2L continuous  - Re-qualified for oxygen, order best fit with [employer]

## 2020-05-09 NOTE — Telephone Encounter (Signed)
Please deny this and fax form back. This came in last week for a refill and pt was advised to contact cardiology

## 2020-05-09 NOTE — Assessment & Plan Note (Signed)
-  Patient remains 100% complaint with CPAP use - Pressure 4-20cm h20; AHI 5.1  - No changes today - FU in 3 months

## 2020-05-09 NOTE — Patient Instructions (Addendum)
Recommendations: - Continue Pulmicort 0.74m/2L twice daily - Continue CPAP every night for 4-6 hours or more - No changes today  Orders: - Renew 2L oxygen continuous  - Best fit oxygen with [employer]   Follow-up: - 3 month recall should already be in for January 2022

## 2020-05-09 NOTE — Telephone Encounter (Signed)
Ok done

## 2020-05-15 DIAGNOSIS — E119 Type 2 diabetes mellitus without complications: Secondary | ICD-10-CM | POA: Diagnosis not present

## 2020-05-15 DIAGNOSIS — Z961 Presence of intraocular lens: Secondary | ICD-10-CM | POA: Diagnosis not present

## 2020-05-15 DIAGNOSIS — H02831 Dermatochalasis of right upper eyelid: Secondary | ICD-10-CM | POA: Diagnosis not present

## 2020-05-15 DIAGNOSIS — H02834 Dermatochalasis of left upper eyelid: Secondary | ICD-10-CM | POA: Diagnosis not present

## 2020-05-15 DIAGNOSIS — H43811 Vitreous degeneration, right eye: Secondary | ICD-10-CM | POA: Diagnosis not present

## 2020-05-15 LAB — HM DIABETES EYE EXAM

## 2020-05-16 ENCOUNTER — Ambulatory Visit: Payer: Medicare PPO | Admitting: Gastroenterology

## 2020-05-16 ENCOUNTER — Encounter: Payer: Self-pay | Admitting: Gastroenterology

## 2020-05-16 VITALS — BP 128/90 | HR 53 | Ht 62.0 in | Wt 153.0 lb

## 2020-05-16 DIAGNOSIS — D509 Iron deficiency anemia, unspecified: Secondary | ICD-10-CM | POA: Diagnosis not present

## 2020-05-16 DIAGNOSIS — K5900 Constipation, unspecified: Secondary | ICD-10-CM | POA: Diagnosis not present

## 2020-05-16 DIAGNOSIS — Z86010 Personal history of colonic polyps: Secondary | ICD-10-CM | POA: Diagnosis not present

## 2020-05-16 MED ORDER — LINACLOTIDE 145 MCG PO CAPS: 145.0000 ug | ORAL_CAPSULE | Freq: Every day | ORAL | 0 refills | Status: DC

## 2020-05-16 NOTE — Patient Instructions (Signed)
If you are age 81 or older, your body mass index should be between 23-30. Your Body mass index is 27.98 kg/m. If this is out of the aforementioned range listed, please consider follow up with your Primary Care Provider.  If you are age 86 or younger, your body mass index should be between 19-25. Your Body mass index is 27.98 kg/m. If this is out of the aformentioned range listed, please consider follow up with your Primary Care Provider.   We have given you samples of the following medication to take: Linzess 145 mcg: Take once daily  You could also try Miralax twice a day. We will give you a coupon today.  Please continue taking oral iron.  Thank you for entrusting me with your care and for choosing [facility], Dr. [place]ellar

## 2020-05-16 NOTE — Progress Notes (Signed)
HPI :  79 year old female with multiple medical problems to include history of coronary artery disease, CHF (ejection fraction 45%), ICD in place, diabetes, stage IV CKD, Pulm HTN with supplemental oxygen use, here for a follow up visit for iron deficiency anemia and constipation.  See prior visit from 2019 for details of her case.  She has had a chronic anemia with labs in the past showing iron deficiency.  After our last clinic visit we proceeded with EGD and colonoscopy in September 2019 as outlined below:  EGD 03/29/18 -  The exam of the esophagus was normal. The entire examined stomach was normal. Biopsies were taken from the antrum, body, and incisura with a cold forceps for Helicobacter pylori testing. The duodenal bulb and second portion of the duodenum were normal. The duodenal sweep was quite angulated and difficult to visualize.   Colonoscopy 03/29/18 - The examined portion of the ileum was normal. - Tortuous colon with looping, poor retention of air associated with significant spasm which prolonged the procedure. - One diminutive polyp in the ascending colon, removed with a cold biopsy forceps. Resected and retrieved. - One 4 mm polyp in the transverse colon, removed with a cold snare. Resected and retrieved. - One 3 mm polyp at the splenic flexure, removed with a cold snare. Resected and retrieved. - One 4 mm polyp in the descending colon, removed with a cold snare. Resected and retrieved. - Two 3 to 4 mm polyps in the sigmoid colon, removed with a cold snare. Resected and retrieved. - The examination was otherwise normal. No obvious cause for iron deficiency on this examination.  1. Stomach, biopsy - GASTRIC ANTRAL AND OXYNTIC MUCOSA WITH MILD CHRONIC GASTRITIS. - FOCAL INTESTINAL METAPLASIA OF ANTRAL MUCOSA, NEGATIVE FOR DYSPLASIA. - WARTHIN-STARRY STAIN IS NEGATIVE FOR HELICOBACTER PYLORI. 2. Colon, polyp(s), ascending, transverse, splenic flexure, descending,  sigmoid - TUBULAR ADENOMA(S). - NEGATIVE FOR HIGH GRADE DYSPLASIA OR MALIGNANCY.    As above, no clear cause for EGD and colonoscopy noted at the time.  We elected to treat with supplemental iron and monitor her blood counts and iron studies over time.  She takes iron at this point about 3 days a week.  She has been doing pretty well.  Her last labs in epic show hemoglobin 11.6 with a normal MCV in August.  She had a follow-up lab with her nephrologist on September 22.  Her hemoglobin at that time was 12, her iron sat is 32%, ferritin of 500.  She has responded well to oral iron.  She does have some constipation in this light, only takes iron about 3 days a week.  She states she will have a bowel movement about once per week on her present regimen.  Stools are hard to get out.  She has been using Dulcolax once a week as needed and this tends to make her have a bowel movement although she has hard stool and strains.  She previously was using MiraLAX once daily.  She states this would eventually build up and cause 1 large loose stool explosively about once a week and she had a hard time dealing with that.  She inquires about other options for that.  She denies any abdominal pains.  She is eating well.  She denies NSAID use, is taking a baby aspirin.  She is accompanied by her daughter today.      Past Medical History:  Diagnosis Date  . Anemia   . CAD (coronary artery disease)   .  Cardiac defibrillator in situ   . Cataract    bilateral 2017  . CHF (congestive heart failure) ([place])   . Chronic systolic heart failure ([place])   . CKD (chronic kidney disease) stage 4, GFR 15-29 ml/min (HCC)   . COPD (chronic obstructive pulmonary disease) ([place])   . Dyspnea   . HTN (hypertension)   . Malignant neoplasm of rectum ([place]) 09/08/2017   Dr. Rollene Rotunda in [place]. Per notes she was due for colonoscopy in 12/2016  . OSA on CPAP 09/08/2017  . Other cardiomyopathies ([place])   . Oxygen deficiency    2 liters at  night  . Pulmonary HTN ([place])   . Sleep apnea    uses c-pap  . Stroke ([place])   . Type 2 diabetes, controlled, with peripheral neuropathy ([place])   . Urge incontinence of urine      Past Surgical History:  Procedure Laterality Date  . BIOPSY  03/29/2018   Procedure: BIOPSY;  Surgeon: Yetta Flock, MD;  Location: [facility];  Service: Gastroenterology;;  . COLONOSCOPY    . COLONOSCOPY WITH PROPOFOL N/A 03/29/2018   Procedure: COLONOSCOPY WITH PROPOFOL;  Surgeon: Yetta Flock, MD;  Location: [facility];  Service: Gastroenterology;  Laterality: N/A;  . ESOPHAGOGASTRODUODENOSCOPY (EGD) WITH PROPOFOL N/A 03/29/2018   Procedure: ESOPHAGOGASTRODUODENOSCOPY (EGD) WITH PROPOFOL;  Surgeon: Yetta Flock, MD;  Location: [facility];  Service: Gastroenterology;  Laterality: N/A;  . POLYPECTOMY  03/29/2018   Procedure: POLYPECTOMY;  Surgeon: Yetta Flock, MD;  Location: [facility];  Service: Gastroenterology;;  . RIGHT HEART CATH N/A 02/15/2020   Procedure: RIGHT HEART CATH;  Surgeon: Jolaine Artist, MD;  Location: [facility];  Service: Cardiovascular;  Laterality: N/A;  . TONSILLECTOMY AND ADENOIDECTOMY     age 61  . TUBAL LIGATION     years ago  . UPPER GASTROINTESTINAL ENDOSCOPY    . UPPER GI ENDOSCOPY  11/28/2009   mild erosive esophagitis, mild erosive changes in stomach, chronic constipation   Family History  Problem Relation Age of Onset  . Colon polyps Daughter   . Hypertension Daughter   . Hypertension Mother   . Stroke Mother   . Hypertension Father   . Diabetes Brother   . Hypertension Brother   . Hypertension Daughter   . Colon cancer Neg Hx   . Esophageal cancer Neg Hx   . Rectal cancer Neg Hx   . Stomach cancer Neg Hx    Social History   Tobacco Use  . Smoking status: Former Smoker    Quit date: 03/14/2009    Years since quitting: 11.1  . Smokeless tobacco: Never Used  . Tobacco comment: quit smoking 2010  Vaping Use  .  Vaping Use: Never used  Substance Use Topics  . Alcohol use: No  . Drug use: No   Current Outpatient Medications  Medication Sig Dispense Refill  . ACCU-CHEK AVIVA PLUS test strip USE  TO TEST BLOOD SUGAR ONE TIME DAILY (Patient taking differently: 1 each by Other route as needed (To test blood sugar). ) 100 strip 1  . Accu-Chek Softclix Lancets lancets USE  AS  INSTRUCTED (Patient taking differently: 1 each by Other route as needed. ) 100 each 2  . Alcohol Swabs (B-D SINGLE USE SWABS REGULAR) PADS USE  TO TEST BLOOD SUGAR ONE TIME DAILY (Patient taking differently: 1 each by Other route as needed (Clean skin before blood sugar reading). ) 100 each 1  . aspirin 81 MG tablet  Take 81 mg by mouth at bedtime.     [name] atorvastatin (LIPITOR) 40 MG tablet TAKE 1 TABLET EVERY DAY 90 tablet 0  . budesonide (PULMICORT) 0.5 MG/2ML nebulizer solution Take 2 mLs (0.5 mg total) by nebulization daily. 2 mL 5  . carvedilol (COREG) 3.125 MG tablet Take 1 tablet (3.125 mg total) by mouth 2 (two) times daily with a meal. 60 tablet 2  . cholecalciferol (VITAMIN D3) 25 MCG (1000 UNIT) tablet Take 1,000 Units by mouth as directed. M-W-F    . ferrous sulfate 325 (65 FE) MG tablet Take 325 mg by mouth as directed. M-W-F    . ipratropium-albuterol (DUONEB) 0.5-2.5 (3) MG/3ML SOLN Take 3 mLs by nebulization every 6 (six) hours as needed (wheezing, SOB). 90 mL 11  . mirabegron ER (MYRBETRIQ) 50 MG TB24 tablet Take 50 mg by mouth as needed. Overactive bladder    . NONFORMULARY OR COMPOUNDED ITEM Antifungal solution: Terbinafine 3%, Fluconazole 2%, Tea Tree Oil 5%, Urea 10%, Ibuprofen 2% in DMSO suspension #16m 1 each 3  . OXYGEN Inhale 2 L into the lungs continuous.     . polyethylene glycol (MIRALAX / GLYCOLAX) 17 g packet Take 17 g by mouth daily as needed for moderate constipation. 14 each 0  . potassium chloride (KLOR-CON) 10 MEQ tablet TAKE 2 TABLETS(20 MEQ) BY MOUTH DAILY (Patient taking differently: Take 10 mEq by  mouth as directed. Determined by weight) 180 tablet 0  . torsemide (DEMADEX) 20 MG tablet Take 60 mg by mouth as needed.     No current facility-administered medications for this visit.   Allergies  Allergen Reactions  . Benazepril Hcl Other (See Comments)    Unknown  . Lotrel [Amlodipine Besy-Benazepril Hcl] Other (See Comments)    Hypotension   . Talwin [Pentazocine] Other (See Comments)    Hallucinations       Review of Systems: All systems reviewed and negative except where noted in HPI.   Lab Results  Component Value Date   WBC 3.3 (L) 03/13/2020   HGB 11.6 03/13/2020   HCT 36.7 03/13/2020   MCV 88 03/13/2020   PLT 257 03/13/2020    Lab Results  Component Value Date   CREATININE 2.38 (H) 03/13/2020   BUN 63 (H) 03/13/2020   NA 144 03/13/2020   K 4.9 03/13/2020   CL 98 03/13/2020   CO2 31 (H) 03/13/2020    Lab Results  Component Value Date   ALT 12 03/13/2020   AST 23 03/13/2020   ALKPHOS 118 03/13/2020   BILITOT 1.0 03/13/2020     Physical Exam: BP 128/90   Pulse (!) 53   Ht _0  (1.575 m)   Wt 153 lb (69.4 kg)   SpO2 99%   BMI 27.98 kg/m  Constitutional: Pleasant,well-developed, female in no acute distress. Abdominal: Soft, nondistended, nontender.  There are no masses palpable. Extremities: no edema Lymphadenopathy: No cervical adenopathy noted. Neurological: Alert and oriented to person place and time. Skin: Skin is warm and dry. No rashes noted. Psychiatric: Normal mood and affect. Behavior is normal.   ASSESSMENT AND PLAN: 79year old female here for reassessment of the following issues:  Iron deficiency anemia - patient is status post EGD and colonoscopy as above which should not show any high risk pathology.  She does have focal gastric intestinal metaplasia in a few small colon polyps that were removed - given her comorbidities I do not feel she warrants surveillance for either of these, risks likely outweigh benefits.  She likely has  small bowel source of her anemia.  Given she has responded well to iron and that has resolved her anemia and iron deficiency, we have not pursued capsule endoscopy to date.  She continues to feel well without any abdominal pain or weight loss, eating well, no blood in her stools.  Given her comorbidities which are extensive as outlined above, as long as her blood counts remained stable without overt bleeding I would not pursue additional endoscopic evaluation of this right now.  If for some reason her anemia worsens or she fails to respond to iron moving forward, I would consider capsule endoscopy.  She is having her blood counts and iron studies monitored by her nephrologist and her primary care, she can follow-up with me as needed for this issue should it recur.  Patient and her daughter agreed with the plan.  Constipation -ongoing constipation in the setting of oral iron use.  Using Dulcolax which can help her move her bowels but not adequate, having only 1 bowel movement per week which is difficult for her.  Low-dose MiraLAX did not work too well, discussed options.  She could try higher dose MiraLAX dosed at twice a day and titrate up as needed for goal bowel movement every day to every other day.  This would prevent buildup and more explosive bowel movements if she is going once weekly.  They inquired about other options we discussed, they were interested in trying Linzess 145 mcg, told her she can try this but side effects include abdominal cramping and more urgent stools with diarrhea.  She will try both of these over the next few weeks and let me know how she wants to proceed, she wants a prescription for Linzess she can let me know  Hinsdale Cellar, MD [facility] Gastroenterology

## 2020-05-21 ENCOUNTER — Other Ambulatory Visit: Payer: Self-pay

## 2020-05-21 NOTE — Patient Outreach (Signed)
[facility]) Care Management  [place]  05/21/2020   [name] August 08, 1940 [id number]  Subjective: Telephone call to patient for disease management follow up. Patient repots she is doing good.  Weight today 149.2 lbs.  Discussed heart failure management and encouraged to keep up the great work. Patient blood sugar doing good with reading this am 101.  Patient now on [place] for constipation, which she states is working.  Patient voices no concerns.     Objective:   Encounter Medications:  Outpatient Encounter Medications as of 05/21/2020  Medication Sig  . ACCU-CHEK AVIVA PLUS test strip USE  TO TEST BLOOD SUGAR ONE TIME DAILY (Patient taking differently: 1 each by Other route as needed (To test blood sugar). )  . Accu-Chek Softclix Lancets lancets USE  AS  INSTRUCTED (Patient taking differently: 1 each by Other route as needed. )  . Alcohol Swabs (B-D SINGLE USE SWABS REGULAR) PADS USE  TO TEST BLOOD SUGAR ONE TIME DAILY (Patient taking differently: 1 each by Other route as needed (Clean skin before blood sugar reading). )  . aspirin 81 MG tablet Take 81 mg by mouth at bedtime.   [name] atorvastatin (LIPITOR) 40 MG tablet TAKE 1 TABLET EVERY DAY  . budesonide (PULMICORT) 0.5 MG/2ML nebulizer solution Take 2 mLs (0.5 mg total) by nebulization daily.  . carvedilol (COREG) 3.125 MG tablet Take 1 tablet (3.125 mg total) by mouth 2 (two) times daily with a meal.  . cholecalciferol (VITAMIN D3) 25 MCG (1000 UNIT) tablet Take 1,000 Units by mouth as directed. M-W-F  . ferrous sulfate 325 (65 FE) MG tablet Take 325 mg by mouth as directed. M-W-F  . ipratropium-albuterol (DUONEB) 0.5-2.5 (3) MG/3ML SOLN Take 3 mLs by nebulization every 6 (six) hours as needed (wheezing, SOB).  [name] linaclotide (LINZESS) 145 MCG CAPS capsule Take 1 capsule (145 mcg total) by mouth daily before breakfast.  . mirabegron ER (MYRBETRIQ) 50 MG TB24 tablet Take 50 mg by mouth as needed. Overactive  bladder  . NONFORMULARY OR COMPOUNDED ITEM Antifungal solution: Terbinafine 3%, Fluconazole 2%, Tea Tree Oil 5%, Urea 10%, Ibuprofen 2% in DMSO suspension #76m  . OXYGEN Inhale 2 L into the lungs continuous.   . polyethylene glycol (MIRALAX / GLYCOLAX) 17 g packet Take 17 g by mouth daily as needed for moderate constipation.  . potassium chloride (KLOR-CON) 10 MEQ tablet TAKE 2 TABLETS(20 MEQ) BY MOUTH DAILY (Patient taking differently: Take 10 mEq by mouth as directed. Determined by weight)  . torsemide (DEMADEX) 20 MG tablet Take 60 mg by mouth as needed.   No facility-administered encounter medications on file as of 05/21/2020.    Functional Status:  In your present state of health, do you have any difficulty performing the following activities: 04/10/2020 04/08/2020  Hearing? N N  Vision? N N  Difficulty concentrating or making decisions? N N  Walking or climbing stairs? Y Y  Comment - needs assist  Dressing or bathing? N Y  Comment - family assist  Doing errands, shopping? YTempie Donning Comment - family assists  Preparing Food and eating ? N N  Using the Toilet? Y N  In the past six months, have you accidently leaked urine? Y Y  Comment - incontinence  Do you have problems with loss of bowel control? Y N  Managing your Medications? N Y  Comment - family manages  Managing your Finances? N Y  Comment - family manages  Housekeeping or managing your  Housekeeping? N Y  Comment - family helps  Some recent data might be hidden    Fall/Depression Screening: Fall Risk  04/10/2020 04/08/2020 03/11/2020  Falls in the past year? 0 0 0  Number falls in past yr: 0 - -  Injury with Fall? 0 - -   PHQ 2/9 Scores 04/10/2020 04/08/2020 03/11/2020 10/04/2019 04/04/2019 01/27/2018 09/01/2017  PHQ - 2 Score 0 0 0 0 0 0 0    Assessment: Patient continues to manage chronic conditions and benefits from disease management follow up.    Goals Addressed            This Visit's Progress   . Track and Manage  Fluids and Swelling   On track    Follow Up Date 06/18/20   - track weight in diary - use salt in moderation - watch for swelling in feet, ankles and legs every day - weigh myself daily    Why is this important?   It is important to check your weight daily and watch how much salt and liquids you have.  It will help you to manage your heart failure.    Notes: Patient reports that if her weight is over 149 to 151 lbs she is to take a fluid pill.  Weight this AM 154 lbs. Patient took fluid pill per order today. Encouraged to continue to monitor weights and limit salt intake and salty food items.      . Track and Manage Symptoms   On track    Follow Up Date 07/19/20   - bring diary to all appointments - develop a rescue plan - follow rescue plan if symptoms flare-up - know when to call the doctor - track symptoms and what helps feel better or worse    Why is this important?   You will be able to handle your symptoms better if you keep track of them.  Making some simple changes to your lifestyle will help.  Eating healthy is one thing you can do to take good care of yourself.    Notes:        Plan: RN CM will contact next month and patient agreeable.   [name], RN, MSN [facility] [facility] Care Management Coordinator Direct Line [phone] Cell [phone] Toll Free: [phone]  Fax: [phone]

## 2020-05-24 ENCOUNTER — Telehealth: Payer: Self-pay | Admitting: Gastroenterology

## 2020-05-24 ENCOUNTER — Encounter: Payer: Self-pay | Admitting: Internal Medicine

## 2020-05-24 NOTE — Telephone Encounter (Signed)
I have spoken to patient's daughter to advise that unfortunately we do not have any samples of Linzess 145 mcg at this time. Advised we may have some in the next couple of weeks but cannot give an exact time frame. Daughter verbalizes understanding.

## 2020-05-28 ENCOUNTER — Telehealth: Payer: Self-pay

## 2020-05-28 ENCOUNTER — Other Ambulatory Visit: Payer: Self-pay | Admitting: Cardiovascular Disease

## 2020-05-28 NOTE — Telephone Encounter (Signed)
Left message for pt to call me back

## 2020-05-28 NOTE — Telephone Encounter (Signed)
No linzess available

## 2020-05-28 NOTE — Telephone Encounter (Signed)
Pt was notified none available

## 2020-05-28 NOTE — Telephone Encounter (Signed)
*  STAT* If patient is at the pharmacy, call can be transferred to refill team.   1. Which medications need to be refilled? (please list name of each medication and dose if known)  torsemide (DEMADEX) 20 MG tablet [[id number]]  potassium chloride (KLOR-CON) 10 MEQ tablet [[id number]]    2. Which pharmacy/location (including street and city if local pharmacy) is medication to be sent to? [facility], [address]  [place], [place] [address]  Phone:  [phone] Fax:  [phone]   3. Do they need a 30 day or 90 day supply? [place]

## 2020-05-28 NOTE — Telephone Encounter (Signed)
Pt states her GI doctor is out of samples of Linzess and she has ordered from [facility] mail order & wanted to know if you have samples you could give her a couple boxes to hold her

## 2020-05-29 ENCOUNTER — Other Ambulatory Visit: Payer: Self-pay

## 2020-05-29 MED ORDER — LINACLOTIDE 145 MCG PO CAPS
145.0000 ug | ORAL_CAPSULE | Freq: Every day | ORAL | 2 refills | Status: DC
Start: 2020-05-29 — End: 2020-08-06

## 2020-05-29 NOTE — Progress Notes (Signed)
rec'd faxed refill request for Linzess 145 mcg from [facility]

## 2020-05-30 ENCOUNTER — Other Ambulatory Visit: Payer: Self-pay

## 2020-05-30 ENCOUNTER — Encounter: Payer: Self-pay | Admitting: Family Medicine

## 2020-05-30 MED ORDER — TORSEMIDE 20 MG PO TABS
60.0000 mg | ORAL_TABLET | ORAL | 6 refills | Status: DC | PRN
Start: 2020-05-30 — End: 2020-06-04

## 2020-05-30 MED ORDER — POTASSIUM CHLORIDE ER 10 MEQ PO TBCR: EXTENDED_RELEASE_TABLET | ORAL | 3 refills | Status: DC

## 2020-05-31 ENCOUNTER — Other Ambulatory Visit: Payer: Self-pay

## 2020-05-31 ENCOUNTER — Encounter (HOSPITAL_COMMUNITY): Payer: Medicare PPO | Admitting: Internal Medicine

## 2020-05-31 MED ORDER — IPRATROPIUM-ALBUTEROL 0.5-2.5 (3) MG/3ML IN SOLN: 3.0000 mL | Freq: Four times a day (QID) | RESPIRATORY_TRACT | 3 refills | Status: DC | PRN

## 2020-06-04 ENCOUNTER — Other Ambulatory Visit: Payer: Self-pay

## 2020-06-04 MED ORDER — TORSEMIDE 20 MG PO TABS
60.0000 mg | ORAL_TABLET | ORAL | 1 refills | Status: DC | PRN
Start: 2020-06-04 — End: 2021-04-07

## 2020-06-04 NOTE — Progress Notes (Signed)
Patient had potable O2 evaluation with [facility] on 05/29/20. She did well on PD device, advised to use 3L pulsed with activity and could takes to 2L pulsed when sitting. While at home and night, use concentrator .   Portable recommended: ML6 Instructed setting: 2-3 pulse

## 2020-06-05 ENCOUNTER — Encounter (HOSPITAL_COMMUNITY): Payer: Self-pay | Admitting: Internal Medicine

## 2020-06-05 ENCOUNTER — Ambulatory Visit (HOSPITAL_COMMUNITY)
Admission: RE | Admit: 2020-06-05 | Discharge: 2020-06-05 | Disposition: A | Discharge status: Home / Self Care | Payer: Medicare PPO | Source: Ambulatory Visit | Attending: Internal Medicine | Admitting: Internal Medicine

## 2020-06-05 ENCOUNTER — Other Ambulatory Visit: Payer: Self-pay

## 2020-06-05 ENCOUNTER — Ambulatory Visit (INDEPENDENT_AMBULATORY_CARE_PROVIDER_SITE_OTHER): Payer: Medicare PPO

## 2020-06-05 VITALS — BP 148/80 | HR 61 | Wt 154.9 lb

## 2020-06-05 DIAGNOSIS — G4733 Obstructive sleep apnea (adult) (pediatric): Secondary | ICD-10-CM

## 2020-06-05 DIAGNOSIS — Z9989 Dependence on other enabling machines and devices: Secondary | ICD-10-CM | POA: Diagnosis not present

## 2020-06-05 DIAGNOSIS — I2721 Secondary pulmonary arterial hypertension: Secondary | ICD-10-CM | POA: Diagnosis not present

## 2020-06-05 DIAGNOSIS — I5042 Chronic combined systolic (congestive) and diastolic (congestive) heart failure: Secondary | ICD-10-CM

## 2020-06-05 DIAGNOSIS — I10 Essential (primary) hypertension: Secondary | ICD-10-CM

## 2020-06-05 DIAGNOSIS — I428 Other cardiomyopathies: Secondary | ICD-10-CM

## 2020-06-05 LAB — CUP PACEART REMOTE DEVICE CHECK
Battery Remaining Longevity: 29 mo
Battery Voltage: 2.94 V
Brady Statistic AP VP Percent: 0.4 %
Brady Statistic AP VS Percent: 89.22 %
Brady Statistic AS VP Percent: 0.05 %
Brady Statistic AS VS Percent: 10.33 %
Brady Statistic RA Percent Paced: 88.38 %
Brady Statistic RV Percent Paced: 0.52 %
Date Time Interrogation Session: 20211117022824
HighPow Impedance: 39 Ohm
HighPow Impedance: 55 Ohm
Implantable Pulse Generator Implant Date: 20131120
Lead Channel Impedance Value: 304 Ohm
Lead Channel Impedance Value: 380 Ohm
Lead Channel Impedance Value: 399 Ohm
Lead Channel Pacing Threshold Amplitude: 0.75 V
Lead Channel Pacing Threshold Amplitude: 0.75 V
Lead Channel Pacing Threshold Pulse Width: 0.4 ms
Lead Channel Pacing Threshold Pulse Width: 0.4 ms
Lead Channel Sensing Intrinsic Amplitude: 0.625 mV
Lead Channel Sensing Intrinsic Amplitude: 0.625 mV
Lead Channel Sensing Intrinsic Amplitude: 3.75 mV
Lead Channel Sensing Intrinsic Amplitude: 3.75 mV
Lead Channel Setting Pacing Amplitude: 1.5 V
Lead Channel Setting Pacing Amplitude: 2 V
Lead Channel Setting Pacing Pulse Width: 0.4 ms
Lead Channel Setting Sensing Sensitivity: 0.3 mV

## 2020-06-05 NOTE — Patient Instructions (Signed)
Your physician has recommended that you have a pulmonary function test. Pulmonary Function Tests are a group of tests that measure how well air moves in and out of your lungs.  Your physician recommends that you schedule a follow-up appointment in: 2-3 months  If you have any questions or concerns before your next appointment please send [place] a message through [place] or call our office at [phone].    TO LEAVE A MESSAGE FOR THE NURSE SELECT OPTION 2, PLEASE LEAVE A MESSAGE INCLUDING: . YOUR NAME . DATE OF BIRTH . CALL BACK NUMBER . REASON FOR CALL**this is important as we prioritize the call backs  YOU WILL RECEIVE A CALL BACK THE SAME DAY AS LONG AS YOU CALL BEFORE 4:00 PM

## 2020-06-05 NOTE — Progress Notes (Signed)
ReDS Vest / Clip - 06/05/20 1400      ReDS Vest / Clip   Station Marker A    Ruler Value 30    ReDS Value Range Low volume    ReDS Actual Value 32

## 2020-06-05 NOTE — Progress Notes (Signed)
PCP: Harland Dingwall NP-C  HF Cardiologist: Dr Haroldine Laws Cardiology: Dr Loleta Chance Nephrology: Dr Resa Miner   HPI: 79 y/o AAF w/ a long standing history of chronic systolic heart failure/ NICM and pulmonary hypertension. Per records, her systolic HF dates back to 2006. Mild nonobstructive CAD by LHC in 2006 and again in 2015. She is s/p Metronic ICD, also w/ restrictive lung disease, OSA on CPAP, essential hypertension, CKD, HLD and T2DM. Her daughter is a Therapist, sports for [employer].   Most recent echo prior to this admit was in 2016 and showed LVEF 45% w/ mild AS, normal RV but severe pulmonary HTN, PASP 70. She has been followed by Dr. Halford Chessman for restrictive lung disease. Previous PFTs(2018)showed no obstruction but fairly significant restriction (65% of predicted)and markedly reduced DLCO(39%, corrected for alveolar volume).  She was admitted 02/11/20 for acute on chronic hypoxic respiratory failure and acute on chronic systolic heart failure w/ volume overload. Hypoxic in the 80s on arrival and required NRB. LEE up to thighs. BNP 617. CXR w/ cardiomegaly, but no overt edema and no infiltrate or effusions. D-dimer elevated at 1.07 but V/Q scan negative for PE. She was started on IV diuretics and diuresed. Echo showed LVEF 45-50% w/ global hypokinesis. RV severely enlarged w/ moderately elevated pulmonary artery systolic pressure, 56.3 mmHg, + mod-severe TR. RHC showed severe PAH w/ normal left-sided pressures. CO/CI normal.  Admitted 02/9372 with A/C Systolic HF. Diuresed with IV lasix and transitioned to torsemide 60 mg daily. Creatinine peaked at 3.42. Renal US was negative. Sildenafil and amlodipine both stopped due to hypotension. Discharged 03/07/2020.   On 04/08/20 Weight at home went down to 140. Dr Orene Desanctis  instructed to change torsemide to 20 mg as needed for weight 150 -152  Pounds.   Today she returns for HF follow up with her daughter. Overall feeling well. Taking torsemide 20 mg if weight  gets above 151 lbs; has not taken any doses this past week, weight stable at 151 lbs at home. Intermittently wears oxygen during the day, if SpO2 drops she will put oxygen on. Uses rolling walker. Wears CPAP every night. Does have some shortness of breath with exertion. Denies PND/Orthopnea, does sleep on 2 pillows at night. No CP or dizziness. Lives alone. Has lots of support from her family.   PFTs 10/18: FEV1 1.06 (69%) FVC 1.48 (65%) FEF 25-75% 0.74 (40%) DLCO 7.8 39%  RHC Findings 02/15/20: RA = 20 RV = 72/27 PA = 79/44 (53) PCW = 18 Fick cardiac output/index = 5.35/2.9 Thermo CO/CI = 4.4/2.4 PVR = 6.5 (Fick) 8.0 (Thermo) Ao sat = 94% PA sat = 64%, 64% SVC sat = 61%  Assessment: 1. Severe PAH with normal left-sided pressures  Plan/Discussion: There is near equalization or diastolic pressures which can reflect restrictive physiology or elevated R-sided pressure with normal left pressures. Given relatively normal PCWP - restriction felt to be less likely.   ROS: All systems negative except as listed in HPI, PMH and Problem List.  SH:  Social History   Socioeconomic History  . Marital status: Widowed    Spouse name: Not on file  . Number of children: Not on file  . Years of education: Not on file  . Highest education level: Not on file  Occupational History  . Not on file  Tobacco Use  . Smoking status: Former Smoker    Quit date: 03/14/2009    Years since quitting: 11.2  . Smokeless tobacco: Never Used  . Tobacco comment: quit  smoking 2010  Vaping Use  . Vaping Use: Never used  Substance and Sexual Activity  . Alcohol use: No  . Drug use: No  . Sexual activity: Not on file  Other Topics Concern  . Not on file  Social History Narrative   Lives alone.  2 daughters and 1 granddaughter in [place]   Social Determinants of Health   Financial Resource Strain:   . Difficulty of Paying Living Expenses: Not on file  Food Insecurity: No Food Insecurity  . Worried  About Charity fundraiser in the Last Year: Never true  . Ran Out of Food in the Last Year: Never true  Transportation Needs: No Transportation Needs  . Lack of Transportation (Medical): No  . Lack of Transportation (Non-Medical): No  Physical Activity:   . Days of Exercise per Week: Not on file  . Minutes of Exercise per Session: Not on file  Stress: No Stress Concern Present  . Feeling of Stress : Not at all  Social Connections: Moderately Integrated  . Frequency of Communication with Friends and Family: More than three times a week  . Frequency of Social Gatherings with Friends and Family: More than three times a week  . Attends Religious Services: More than 4 times per year  . Active Member of Clubs or Organizations: Yes  . Attends Archivist Meetings: 1 to 4 times per year  . Marital Status: Widowed  Intimate Partner Violence:   . Fear of Current or Ex-Partner: Not on file  . Emotionally Abused: Not on file  . Physically Abused: Not on file  . Sexually Abused: Not on file    FH:  Family History  Problem Relation Age of Onset  . Colon polyps Daughter   . Hypertension Daughter   . Hypertension Mother   . Stroke Mother   . Hypertension Father   . Diabetes Brother   . Hypertension Brother   . Hypertension Daughter   . Colon cancer Neg Hx   . Esophageal cancer Neg Hx   . Rectal cancer Neg Hx   . Stomach cancer Neg Hx     Past Medical History:  Diagnosis Date  . Anemia   . CAD (coronary artery disease)   . Cardiac defibrillator in situ   . Cataract    bilateral 2017  . CHF (congestive heart failure) ([place])   . Chronic systolic heart failure ([place])   . CKD (chronic kidney disease) stage 4, GFR 15-29 ml/min (HCC)   . COPD (chronic obstructive pulmonary disease) ([place])   . Dyspnea   . HTN (hypertension)   . 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 Rollene Rotunda in [place]. Per notes she was due for colonoscopy in 12/2016  . OSA on CPAP 09/08/2017  .  Other cardiomyopathies ([place])   . Oxygen deficiency    2 liters at night  . Pulmonary HTN ([place])   . Sleep apnea    uses c-pap  . Stroke ([place])   . Type 2 diabetes, controlled, with peripheral neuropathy ([place])   . Urge incontinence of urine     Current Outpatient Medications  Medication Sig Dispense Refill  . ACCU-CHEK AVIVA PLUS test strip USE  TO TEST BLOOD SUGAR ONE TIME DAILY (Patient taking differently: 1 each by Other route as needed (To test blood sugar). ) 100 strip 1  . Accu-Chek Softclix Lancets lancets USE  AS  INSTRUCTED (Patient taking differently: 1 each by Other route as needed. ) 100 each  2  . Alcohol Swabs (B-D SINGLE USE SWABS REGULAR) PADS USE  TO TEST BLOOD SUGAR ONE TIME DAILY (Patient taking differently: 1 each by Other route as needed (Clean skin before blood sugar reading). ) 100 each 1  . aspirin 81 MG tablet Take 81 mg by mouth at bedtime.     [name] atorvastatin (LIPITOR) 40 MG tablet TAKE 1 TABLET EVERY DAY 90 tablet 0  . budesonide (PULMICORT) 0.5 MG/2ML nebulizer solution Take 2 mLs (0.5 mg total) by nebulization daily. 2 mL 5  . carvedilol (COREG) 3.125 MG tablet Take 1 tablet (3.125 mg total) by mouth 2 (two) times daily with a meal. 60 tablet 2  . cholecalciferol (VITAMIN D3) 25 MCG (1000 UNIT) tablet Take 1,000 Units by mouth as directed. M-W-F    . ferrous sulfate 325 (65 FE) MG tablet Take 325 mg by mouth as directed. M-W-F    . ipratropium-albuterol (DUONEB) 0.5-2.5 (3) MG/3ML SOLN Take 3 mLs by nebulization every 6 (six) hours as needed (wheezing, SOB). 360 mL 3  . linaclotide (LINZESS) 145 MCG CAPS capsule Take 1 capsule (145 mcg total) by mouth daily before breakfast. 30 capsule 2  . mirabegron ER (MYRBETRIQ) 50 MG TB24 tablet Take 50 mg by mouth as needed. Overactive bladder    . NONFORMULARY OR COMPOUNDED ITEM Antifungal solution: Terbinafine 3%, Fluconazole 2%, Tea Tree Oil 5%, Urea 10%, Ibuprofen 2% in DMSO suspension #80m 1 each 3  . OXYGEN Inhale 2 L  into the lungs continuous.     . potassium chloride (KLOR-CON) 10 MEQ tablet TAKE 2 TABLETS(20 MEQ) BY MOUTH DAILY 180 tablet 3  . torsemide (DEMADEX) 20 MG tablet Take 3 tablets (60 mg total) by mouth as needed. 90 tablet 1   No current facility-administered medications for this encounter.    Vitals:   06/05/20 1421  BP: (!) 148/80  Pulse: 61  SpO2: 100%  Weight: 70.3 kg (154 lb 14.4 oz)   Wt Readings from Last 3 Encounters:  06/05/20 70.3 kg (154 lb 14.4 oz)  05/16/20 69.4 kg (153 lb)  05/09/20 71.8 kg (158 lb 3.2 oz)   Device Interrogation: no AT/AF, patient activity ~0.1 hr/day  ReD's Clip: 32%  PHYSICAL EXAM: General: Well appearing. No resp difficulty HEENT: normal Neck: supple. JVP 5-6. Carotids 2+ bilat; no bruits. No lymphadenopathy or thryomegaly appreciated. Cor: PMI nondisplaced. Regular rate & rhythm. No rubs, gallops or murmurs. Lungs: clear, RLL fine crackles Abdomen: soft, nontender, nondistended. No hepatosplenomegaly. No bruits or masses. Good bowel sounds. Extremities: no cyanosis, clubbing, rash, edema Neuro: alert & orientedx3, cranial nerves grossly intact. moves all 4 extremities w/o difficulty. Affect pleasant  ASSESSMENT & PLAN:  1. PAH - RHC 01/2020 demonstrated severe PAH w/ normal left-sided pressures. PA = 79/44 (53). CO/CI normal. 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  - Continue CPAP qhs - Sildenafil stopped during the August hospitalization due to hypotension.  - Feeling better NYHA II-III - Repeat PFTs, if stable or improved, will get RHC and consider careful trial of pulmonary vasodilatora - Encouraged to walk 10 minutes TID around the house.  2. Chronic Systolic Heart Failure w/ Predominant RV Failure - long standing history of chronic systolic heart failure/ NICM and pulmonary hypertension. Mild nonobstructive CAD by LHC in 2006 and again in 2015.  - Medtronic ICD Device Interrogation:  no AT/AF, patient activity ~0.1 hr/day  - Echo 01/2020 EF 45-50%. RV severely enlarged w/ moderately  reduced systolic function 2/2 PH - NYHA III, Volume status good today, Reds Clip 32%.  - Continue Coreg 3.125 bid - Continue torsemide prn  3. Chronic hypoxic/hypercapnic respiratory failure - Multifactorial, 2/2 PAH, a/c CHF and restrictive lung disease  - Supp O2, SpO2 goal >88% - Continue pulmicort, duoneb - CPAP qhs - Needs to wear oxygen 2 liters continuously.   4. CKD Stage IIIb.  - Cr baseline ~1.5-1.9 - Followed by Dr. Carolynne Edouard   5. HTN  - Elevated today. Allowing to run high per Nephrology.  - Norvasc 60m PRN is SBP>140  6. T2DM - Per PCP   7. OSA - Continue CPAP qhs   DGlori BickersMD 3:20 PM

## 2020-06-06 NOTE — Addendum Note (Signed)
Encounter addended by: Jolaine Artist, MD on: 06/06/2020 2:25 PM  Actions taken: Level of Service modified, Visit diagnoses modified

## 2020-06-07 DIAGNOSIS — I5089 Other heart failure: Secondary | ICD-10-CM | POA: Diagnosis not present

## 2020-06-07 DIAGNOSIS — J449 Chronic obstructive pulmonary disease, unspecified: Secondary | ICD-10-CM | POA: Diagnosis not present

## 2020-06-07 DIAGNOSIS — R531 Weakness: Secondary | ICD-10-CM | POA: Diagnosis not present

## 2020-06-07 DIAGNOSIS — I10 Essential (primary) hypertension: Secondary | ICD-10-CM | POA: Diagnosis not present

## 2020-06-07 DIAGNOSIS — G4733 Obstructive sleep apnea (adult) (pediatric): Secondary | ICD-10-CM | POA: Diagnosis not present

## 2020-06-07 NOTE — Progress Notes (Signed)
Remote ICD transmission.   

## 2020-06-11 ENCOUNTER — Other Ambulatory Visit: Payer: Self-pay

## 2020-06-11 MED ORDER — CARVEDILOL 3.125 MG PO TABS
3.1250 mg | ORAL_TABLET | Freq: Two times a day (BID) | ORAL | 2 refills | Status: DC
Start: 2020-06-11 — End: 2020-08-13

## 2020-06-19 ENCOUNTER — Other Ambulatory Visit: Payer: Self-pay

## 2020-06-19 NOTE — Patient Instructions (Signed)
Goals Addressed            This Visit's Progress   . THN-Track and Manage Fluids and Swelling   On track    Follow Up Date 09/16/20   - track weight in diary - use salt in moderation - watch for swelling in feet, ankles and legs every day - weigh myself daily    Why is this important?   It is important to check your weight daily and watch how much salt and liquids you have.  It will help you to manage your heart failure.    Notes: Patient reports that if her weight is over 149 to 151 lbs she is to take a fluid pill.  Weight this AM 147.2 lbs. Encouraged to continue to monitor weights and limit salt intake and salty food items.      . THN-Track and Manage Symptoms   On track    Follow Up Date 09/16/20   - bring diary to all appointments - develop a rescue plan - follow rescue plan if symptoms flare-up - know when to call the doctor - track symptoms and what helps feel better or worse    Why is this important?   You will be able to handle your symptoms better if you keep track of them.  Making some simple changes to your lifestyle will help.  Eating healthy is one thing you can do to take good care of yourself.    Notes:

## 2020-06-19 NOTE — Patient Outreach (Signed)
[facility]) Care Management  [place]  06/19/2020   [name] [birth year] [id number]  Subjective: Telephone call to patient for follow up. Patient reports she is doing good.  Weight today was 147.2 lbs. Discussed heart failure and management.  She verbalized understanding and voices no concerns.    Objective:   Encounter Medications:  Outpatient Encounter Medications as of 06/19/2020  Medication Sig  . ACCU-CHEK AVIVA PLUS test strip USE  TO TEST BLOOD SUGAR ONE TIME DAILY (Patient taking differently: 1 each by Other route as needed (To test blood sugar). )  . Accu-Chek Softclix Lancets lancets USE  AS  INSTRUCTED (Patient taking differently: 1 each by Other route as needed. )  . Alcohol Swabs (B-D SINGLE USE SWABS REGULAR) PADS USE  TO TEST BLOOD SUGAR ONE TIME DAILY (Patient taking differently: 1 each by Other route as needed (Clean skin before blood sugar reading). )  . aspirin 81 MG tablet Take 81 mg by mouth at bedtime.   [name] atorvastatin (LIPITOR) 40 MG tablet TAKE 1 TABLET EVERY DAY  . budesonide (PULMICORT) 0.5 MG/2ML nebulizer solution Take 2 mLs (0.5 mg total) by nebulization daily.  . carvedilol (COREG) 3.125 MG tablet Take 1 tablet (3.125 mg total) by mouth 2 (two) times daily with a meal.  . cholecalciferol (VITAMIN D3) 25 MCG (1000 UNIT) tablet Take 1,000 Units by mouth as directed. M-W-F  . ferrous sulfate 325 (65 FE) MG tablet Take 325 mg by mouth as directed. M-W-F  . ipratropium-albuterol (DUONEB) 0.5-2.5 (3) MG/3ML SOLN Take 3 mLs by nebulization every 6 (six) hours as needed (wheezing, SOB).  [name] linaclotide (LINZESS) 145 MCG CAPS capsule Take 1 capsule (145 mcg total) by mouth daily before breakfast.  . mirabegron ER (MYRBETRIQ) 50 MG TB24 tablet Take 50 mg by mouth as needed. Overactive bladder  . NONFORMULARY OR COMPOUNDED ITEM Antifungal solution: Terbinafine 3%, Fluconazole 2%, Tea Tree Oil 5%, Urea 10%, Ibuprofen 2% in DMSO suspension  #30m  . OXYGEN Inhale 2 L into the lungs continuous.   . potassium chloride (KLOR-CON) 10 MEQ tablet TAKE 2 TABLETS(20 MEQ) BY MOUTH DAILY  . torsemide (DEMADEX) 20 MG tablet Take 3 tablets (60 mg total) by mouth as needed.   No facility-administered encounter medications on file as of 06/19/2020.    Functional Status:  In your present state of health, do you have any difficulty performing the following activities: 04/10/2020 04/08/2020  Hearing? N N  Vision? N N  Difficulty concentrating or making decisions? N N  Walking or climbing stairs? Y Y  Comment - needs assist  Dressing or bathing? N Y  Comment - family assist  Doing errands, shopping? YTempie Donning Comment - family assists  Preparing Food and eating ? N N  Using the Toilet? Y N  In the past six months, have you accidently leaked urine? Y Y  Comment - incontinence  Do you have problems with loss of bowel control? Y N  Managing your Medications? N Y  Comment - family manages  Managing your Finances? N Y  Comment - family manages  Housekeeping or managing your Housekeeping? N Y  Comment - family helps  Some recent data might be hidden    Fall/Depression Screening: Fall Risk  04/10/2020 04/08/2020 03/11/2020  Falls in the past year? 0 0 0  Number falls in past yr: 0 - -  Injury with Fall? 0 - -   PHQ 2/9 Scores 04/10/2020 04/08/2020 03/11/2020 10/04/2019  04/04/2019 01/27/2018 09/01/2017  PHQ - 2 Score 0 0 0 0 0 0 0    Assessment: Patient continues to manage chronic conditions and has support from family.   Goals Addressed            This Visit's Progress   . THN-Track and Manage Fluids and Swelling   On track    Follow Up Date 09/16/20   - track weight in diary - use salt in moderation - watch for swelling in feet, ankles and legs every day - weigh myself daily    Why is this important?   It is important to check your weight daily and watch how much salt and liquids you have.  It will help you to manage your heart  failure.    Notes: Patient reports that if her weight is over 149 to 151 lbs she is to take a fluid pill.  Weight this AM 147.2 lbs. Encouraged to continue to monitor weights and limit salt intake and salty food items.      . THN-Track and Manage Symptoms   On track    Follow Up Date 09/16/20   - bring diary to all appointments - develop a rescue plan - follow rescue plan if symptoms flare-up - know when to call the doctor - track symptoms and what helps feel better or worse    Why is this important?   You will be able to handle your symptoms better if you keep track of them.  Making some simple changes to your lifestyle will help.  Eating healthy is one thing you can do to take good care of yourself.    Notes:        Plan: RN CM will contact patient next month and patient agreeable.   [name], RN, MSN [place] Management Care Management Coordinator Direct Line [phone] Cell [phone] Toll Free: [phone]  Fax: [phone]

## 2020-06-26 ENCOUNTER — Other Ambulatory Visit: Payer: Self-pay | Admitting: Family Medicine

## 2020-06-28 ENCOUNTER — Other Ambulatory Visit: Payer: Self-pay | Admitting: Family Medicine

## 2020-07-07 DIAGNOSIS — I5089 Other heart failure: Secondary | ICD-10-CM | POA: Diagnosis not present

## 2020-07-07 DIAGNOSIS — R531 Weakness: Secondary | ICD-10-CM | POA: Diagnosis not present

## 2020-07-07 DIAGNOSIS — J449 Chronic obstructive pulmonary disease, unspecified: Secondary | ICD-10-CM | POA: Diagnosis not present

## 2020-07-07 DIAGNOSIS — I10 Essential (primary) hypertension: Secondary | ICD-10-CM | POA: Diagnosis not present

## 2020-07-07 DIAGNOSIS — G4733 Obstructive sleep apnea (adult) (pediatric): Secondary | ICD-10-CM | POA: Diagnosis not present

## 2020-07-18 DIAGNOSIS — I5089 Other heart failure: Secondary | ICD-10-CM | POA: Diagnosis not present

## 2020-07-18 DIAGNOSIS — J449 Chronic obstructive pulmonary disease, unspecified: Secondary | ICD-10-CM | POA: Diagnosis not present

## 2020-07-18 DIAGNOSIS — G4733 Obstructive sleep apnea (adult) (pediatric): Secondary | ICD-10-CM | POA: Diagnosis not present

## 2020-07-18 DIAGNOSIS — I10 Essential (primary) hypertension: Secondary | ICD-10-CM | POA: Diagnosis not present

## 2020-07-24 ENCOUNTER — Other Ambulatory Visit: Payer: Self-pay

## 2020-07-24 NOTE — Patient Instructions (Signed)
Goals Addressed            This Visit's Progress   . THN-Track and Manage Fluids and Swelling   On track    Timeframe:  Long-Range Goal Priority:  High Start Date:   07/24/20                          Expected End Date:  09/16/20                    Follow Up Date 09/16/20   - track weight in diary - use salt in moderation - watch for swelling in feet, ankles and legs every day - weigh myself daily    Why is this important?   It is important to check your weight daily and watch how much salt and liquids you have.  It will help you to manage your heart failure.    Notes: Patient reports that if her weight is over 149 to 151 lbs she is to take a fluid pill.  Weight this AM 150 lbs. Encouraged to continue to monitor weights and limit salt intake and salty food items.      . THN-Track and Manage Symptoms   On track    Timeframe:  Long-Range Goal Priority:  High Start Date:  07/24/20                           Expected End Date: 09/16/20                     Follow Up Date 09/16/20   - bring diary to all appointments - develop a rescue plan - follow rescue plan if symptoms flare-up - know when to call the doctor - track symptoms and what helps feel better or worse    Why is this important?   You will be able to handle your symptoms better if you keep track of them.  Making some simple changes to your lifestyle will help.  Eating healthy is one thing you can do to take good care of yourself.    Notes:

## 2020-07-24 NOTE — Patient Outreach (Signed)
[facility]) Care Management  [place]  07/24/2020   CHENEE MUNNS 1940/10/11 [id number]  Subjective: Telephone call to patient for disease management follow up. Patient reports doing good. She reports her weight was up Friday and she needed to take a fluid pill but weight has come down since then.  Weight today 150 lbs.  Discussed weight parameters and when to notify physician. She verbalized understanding.   Objective:   Encounter Medications:  Outpatient Encounter Medications as of 07/24/2020  Medication Sig  . ACCU-CHEK AVIVA PLUS test strip USE  TO TEST BLOOD SUGAR ONE TIME DAILY  . Accu-Chek Softclix Lancets lancets USE  AS  INSTRUCTED (Patient taking differently: 1 each by Other route as needed. )  . Alcohol Swabs (B-D SINGLE USE SWABS REGULAR) PADS USE  TO TEST BLOOD SUGAR ONE TIME DAILY (Patient taking differently: 1 each by Other route as needed (Clean skin before blood sugar reading). )  . aspirin 81 MG tablet Take 81 mg by mouth at bedtime.   [name] atorvastatin (LIPITOR) 40 MG tablet TAKE 1 TABLET EVERY DAY  . budesonide (PULMICORT) 0.5 MG/2ML nebulizer solution Take 2 mLs (0.5 mg total) by nebulization daily.  . carvedilol (COREG) 3.125 MG tablet Take 1 tablet (3.125 mg total) by mouth 2 (two) times daily with a meal.  . cholecalciferol (VITAMIN D3) 25 MCG (1000 UNIT) tablet Take 1,000 Units by mouth as directed. M-W-F  . ferrous sulfate 325 (65 FE) MG tablet Take 325 mg by mouth as directed. M-W-F  . ipratropium-albuterol (DUONEB) 0.5-2.5 (3) MG/3ML SOLN Take 3 mLs by nebulization every 6 (six) hours as needed (wheezing, SOB).  [name] linaclotide (LINZESS) 145 MCG CAPS capsule Take 1 capsule (145 mcg total) by mouth daily before breakfast.  . mirabegron ER (MYRBETRIQ) 50 MG TB24 tablet Take 50 mg by mouth as needed. Overactive bladder  . NONFORMULARY OR COMPOUNDED ITEM Antifungal solution: Terbinafine 3%, Fluconazole 2%, Tea Tree Oil 5%, Urea 10%, Ibuprofen 2%  in DMSO suspension #74m  . OXYGEN Inhale 2 L into the lungs continuous.   . potassium chloride (KLOR-CON) 10 MEQ tablet TAKE 2 TABLETS(20 MEQ) BY MOUTH DAILY  . torsemide (DEMADEX) 20 MG tablet Take 3 tablets (60 mg total) by mouth as needed.   No facility-administered encounter medications on file as of 07/24/2020.    Functional Status:  In your present state of health, do you have any difficulty performing the following activities: 04/10/2020 04/08/2020  Hearing? N N  Vision? N N  Difficulty concentrating or making decisions? N N  Walking or climbing stairs? Y Y  Comment - needs assist  Dressing or bathing? N Y  Comment - family assist  Doing errands, shopping? YTempie Donning Comment - family assists  Preparing Food and eating ? N N  Using the Toilet? Y N  In the past six months, have you accidently leaked urine? Y Y  Comment - incontinence  Do you have problems with loss of bowel control? Y N  Managing your Medications? N Y  Comment - family manages  Managing your Finances? N Y  Comment - family manages  Housekeeping or managing your Housekeeping? N Y  Comment - family helps  Some recent data might be hidden    Fall/Depression Screening: Fall Risk  04/10/2020 04/08/2020 03/11/2020  Falls in the past year? 0 0 0  Number falls in past yr: 0 - -  Injury with Fall? 0 - -   PHQ 2/9 Scores  04/10/2020 04/08/2020 03/11/2020 10/04/2019 04/04/2019 01/27/2018 09/01/2017  PHQ - 2 Score 0 0 0 0 0 0 0    Assessment: Patient continues to manage chronic conditions and see physicians as scheduled.  Goals Addressed            This Visit's Progress   . THN-Track and Manage Fluids and Swelling   On track    Timeframe:  Long-Range Goal Priority:  High Start Date:   07/24/20                          Expected End Date:  09/16/20                    Follow Up Date 09/16/20   - track weight in diary - use salt in moderation - watch for swelling in feet, ankles and legs every day - weigh myself daily     Why is this important?   It is important to check your weight daily and watch how much salt and liquids you have.  It will help you to manage your heart failure.    Notes: Patient reports that if her weight is over 149 to 151 lbs she is to take a fluid pill.  Weight this AM 150 lbs. Encouraged to continue to monitor weights and limit salt intake and salty food items.      . THN-Track and Manage Symptoms   On track    Timeframe:  Long-Range Goal Priority:  High Start Date:  07/24/20                           Expected End Date: 09/16/20                     Follow Up Date 09/16/20   - bring diary to all appointments - develop a rescue plan - follow rescue plan if symptoms flare-up - know when to call the doctor - track symptoms and what helps feel better or worse    Why is this important?   You will be able to handle your symptoms better if you keep track of them.  Making some simple changes to your lifestyle will help.  Eating healthy is one thing you can do to take good care of yourself.    Notes:        Plan: RN CM will contact patient next month and patient agreeable.   Follow-up:  Patient agrees to Care Plan and Follow-up.   [name], RN, MSN [place] Management Care Management Coordinator Direct Line [phone] Cell [phone] Toll Free: [phone]  Fax: [phone]

## 2020-07-29 ENCOUNTER — Encounter: Payer: Medicare PPO | Admitting: Cardiovascular Disease

## 2020-07-30 ENCOUNTER — Other Ambulatory Visit: Payer: Self-pay

## 2020-07-30 ENCOUNTER — Ambulatory Visit (INDEPENDENT_AMBULATORY_CARE_PROVIDER_SITE_OTHER): Payer: Medicare PPO | Admitting: Cardiovascular Disease

## 2020-07-30 ENCOUNTER — Encounter: Payer: Self-pay | Admitting: Cardiovascular Disease

## 2020-07-30 VITALS — BP 158/64 | HR 62 | Ht 62.0 in | Wt 157.0 lb

## 2020-07-30 DIAGNOSIS — I2721 Secondary pulmonary arterial hypertension: Secondary | ICD-10-CM | POA: Diagnosis not present

## 2020-07-30 DIAGNOSIS — I5042 Chronic combined systolic (congestive) and diastolic (congestive) heart failure: Secondary | ICD-10-CM | POA: Diagnosis not present

## 2020-07-30 DIAGNOSIS — Z95810 Presence of automatic (implantable) cardiac defibrillator: Secondary | ICD-10-CM | POA: Diagnosis not present

## 2020-07-30 DIAGNOSIS — E1142 Type 2 diabetes mellitus with diabetic polyneuropathy: Secondary | ICD-10-CM | POA: Diagnosis not present

## 2020-07-30 DIAGNOSIS — N184 Chronic kidney disease, stage 4 (severe): Secondary | ICD-10-CM

## 2020-07-30 DIAGNOSIS — I471 Supraventricular tachycardia: Secondary | ICD-10-CM | POA: Diagnosis not present

## 2020-07-30 DIAGNOSIS — Z9989 Dependence on other enabling machines and devices: Secondary | ICD-10-CM

## 2020-07-30 DIAGNOSIS — G4733 Obstructive sleep apnea (adult) (pediatric): Secondary | ICD-10-CM

## 2020-07-30 DIAGNOSIS — I10 Essential (primary) hypertension: Secondary | ICD-10-CM

## 2020-07-30 DIAGNOSIS — E7800 Pure hypercholesterolemia, unspecified: Secondary | ICD-10-CM | POA: Diagnosis not present

## 2020-07-30 DIAGNOSIS — I2510 Atherosclerotic heart disease of native coronary artery without angina pectoris: Secondary | ICD-10-CM

## 2020-07-30 MED ORDER — AMLODIPINE BESYLATE 5 MG PO TABS
5.0000 mg | ORAL_TABLET | Freq: Every day | ORAL | 3 refills | Status: DC
Start: 2020-07-30 — End: 2021-04-16

## 2020-07-30 NOTE — Progress Notes (Signed)
Cardiology Office Note:    Date:  08/04/2020   ID:  [name], DOB Mar 31, 1941, MRN [id number]  PCP:  Girtha Rm, NP-C  Cardiologist:  Sanda Klein, MD   Referring MD: Girtha Rm, NP-C   Chief complaint: CHF, ICD  History of Present Illness:    [name] is a 80 y.o. female with a hx of nonischemic cardiomyopathy (previous EF 35%, most recent EF 45-50%), minor nonobstructive coronary artery disease (last left heart cath July 2021), status post defibrillator (Medtronic Stanton dual-chamber, implanted 2013, Sprint [place] 6949-lead under advisory), severe pulmonary hypertension, obstructive sleep apnea, restrictive lung disease, essential hypertension, hypercholesterolemia type 2 diabetes mellitus, which is currently diet controlled.  Generally doing well.  Uses oxygen at 2 L by nasal cannula when she moves around, but not all day long.  Uses an extra dose of torsemide for excessive weight gain about 2 or 3 times a week.  Typically her weight stays in the 148-152 pound range at home (5 pounds higher on our office scale).  She has not had lower extremity edema, orthopnea, PND, palpitations, dizziness or syncope.  She does not have complaints of focal neurological symptoms or claudication.  Blood pressure has been a little high today 158/64, roughly the same at home, but she has not checked it consistently in the last few weeks. Was taking amodipine 5 mg only "as needed".  Her electrocardiogram shows atrial paced, ventricular sensed rhythm.  Interrogation of her device shows that she is in this rhythm 80% of the time, only 0.1% ventricular pacing.  She has not had any atrial fibrillation or ventricular tachycardia.  Her heart rate histograms are very flat but she is also very sedentary.  Estimated generator longevity is another 2.5 years.  Her Medtronic Evera S device from 2013 does not have OptiVol as a feature.  She was admitted for congestive heart failure with gradual  onset of hypervolemia in July 2021 (possibly related to inability to comply with CPAP due to defective equipment). She had marked hypoxemia, requiring 5-6 L/minute oxygen for quite a while despite aggressive diuresis. She underwent right heart catheterization with the following findings.  Findings:  RA = 20 RV = 72/27 PA = 79/44 (53) PCW = 18 Fick cardiac output/index = 5.35/2.9 Thermo CO/CI = 4.4/2.4 PVR = 6.5 (Fick) 8.0 (Thermo) Ao sat = 94% PA sat = 64%, 64% SVC sat = 61%  Assessment: 1. Severe PAH with normal left-sided pressures  Plan/Discussion:  There is near equalization or diastolic pressures which can reflect restrictive physiology or elevated R-sided pressure with normal left pressures. Given relatively normal PCWP - restriction felt to be less likely.   Treatment of PDE 5 inhibitors was initiated and seems to be well-tolerated and allowed additional diuresis. She was discharged to a rehab facility where she quickly reaccumulated fluid and rehospitalized. She responded to repeat treatment with diuretics. It was suspected that the sildenafil may have contributed to worsening left heart failure and this medication was then discontinued.  She is back down to the oxygen at 2 L by nasal cannula and does not appear to need to use this all the time. About a week ago she appeared to be excessively diuresed with a weight of 140 pounds and worsening renal parameters. After reduction of the diuretic her weight today is back up to 152 pounds on her home scale. She does not have any lower extremity edema today. Her lungs were clear. NYHA functional class II exertional dyspnea.  Repeatedly, it appears that the optimal "dry weight" is around 150 pounds on her home scale (about 4 lbs higher on office scale).  She has not had any problems with chest discomfort, leg edema, palpitations, orthopnea or PND. She denies dizziness or syncope.  Device download on 820/2021 shows normal ICD function  shows no recent episodes of high ventricular rate or atrial mode switch. She had an episode of paroxysmal atrial tachycardia or possibly slow atrial flutter at about 200 bpm/ventricular rate 80 bpm on May 2.  It lasted for about an hour and was asymptomatic.    High resolution chest CT repeated in July 2021 did not show evidence of fibrotic lung disease. Nevertheless, the PFTs are currently showing restrictive lung disease with FVC around 65% of predicted and, certain reduction in FEV1  Her most recent lipid parameters were excellent and her hemoglobin A1c was 6.5%, without medicines for diabetes. Her creatinine has varied recently with varying degrees of diuresis. On 8/25 creatinine was 2.38, while her baseline appears to be around 2.0. Her nephrologist is Dr. [name]. (Creatinine was as high as 3.42 during aggressive diuresis).)  Her last cardiac catheterization was in December 2015 and described "mild plaquing and no obstructive CAD" in the left main and circumflex coronary artery, while the LAD and RCA were described as normal.  At that time the EF was 35-45% by ventriculography in the left ventricular end-diastolic pressure was 30 mmHg.  The mean pulmonary artery pressure was 25 mmHg.  The pulmonary artery pressure was 90/23 mmHg (with systemic blood pressure 168/66).  Carotid duplex ultrasound performed in October 2017 showed minor plaque in both carotids and antegrade flow in both vertebral arteries.  Her device shows occasional relatively brief episodes of atrial mode switch, most commonly paroxysmal atrial tachycardia or slow atrial flutter with varying degrees of AV block. High ventricular rates are not seen. The longest episode lasted for about an hour and occurred in May 2021. Lead parameters are normal.  As mentioned her ventricular lead is a Sprint [employer] [id number] under advisory but shows no evidence of SIC.  Her cardiologist in [place] for many years was Dr. Elberta Spaniel, most recently Dr.  Barnie Alderman  She bears a diagnosis of restrictive lung disease and obstructive sleep apnea.  She is supposed to be on home oxygen.  She uses CPAP for severe obstructive sleep apnea with an apnea hypotony index of 59 and desaturations down to 80%.  Despite treatment with CPAP it has been very difficult to control her AHI.  She plans to follow-up with Dr. Halford Chessman or with Dr. Elsworth Soho in the pulmonary clinic.  The report stated that prior PFTs showed no obstruction but fairly significant restriction and reduced DLCO but repeat PFTs today were improved, CT chest was unremarkable and the feeling was that her initial PFTs may have not been accurate.  Most recent hemoglobin A1c is documented as being 6.1%, on glimepiride monotherapy.  She is not on statin therapy, but her previous notes report that she was taking atorvastatin 40 mg daily and her LDL was 36.[name]  Her baseline creatinine seems to be 1.6-1.9.  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  She had a pulmonary embolism 40 years ago.  EGD performed in 2011 showed mild erosive esophagitis and gastritis.  She was prescribed Protonix.  She has a remote history of depression for which she is not currently receiving any medications.  She has never smoked and denies use of alcohol.  Past Medical History:  Diagnosis Date  . Anemia   . CAD (coronary artery disease)   . Cardiac defibrillator in situ   . Cataract    bilateral 2017  . CHF (congestive heart failure) ([place])   . Chronic systolic heart failure ([place])   . CKD (chronic kidney disease) stage 4, GFR 15-29 ml/min (HCC)   . COPD (chronic obstructive pulmonary disease) ([place])   . Dyspnea   . HTN (hypertension)   . Malignant neoplasm of rectum ([facility]) 09/08/2017   Dr. Rollene Rotunda in [place]. Per notes she was due for colonoscopy in 12/2016  . OSA on CPAP 09/08/2017  . Other cardiomyopathies ([place])   . Oxygen deficiency    2 liters at night  . Pulmonary HTN ([place])   . Sleep  apnea    uses c-pap  . Stroke ([place])   . Type 2 diabetes, controlled, with peripheral neuropathy ([place])   . Urge incontinence of urine     Past Surgical History:  Procedure Laterality Date  . BIOPSY  03/29/2018   Procedure: BIOPSY;  Surgeon: Yetta Flock, MD;  Location: [facility];  Service: Gastroenterology;;  . COLONOSCOPY    . COLONOSCOPY WITH PROPOFOL N/A 03/29/2018   Procedure: COLONOSCOPY WITH PROPOFOL;  Surgeon: Yetta Flock, MD;  Location: [facility];  Service: Gastroenterology;  Laterality: N/A;  . ESOPHAGOGASTRODUODENOSCOPY (EGD) WITH PROPOFOL N/A 03/29/2018   Procedure: ESOPHAGOGASTRODUODENOSCOPY (EGD) WITH PROPOFOL;  Surgeon: Yetta Flock, MD;  Location: [facility];  Service: Gastroenterology;  Laterality: N/A;  . POLYPECTOMY  03/29/2018   Procedure: POLYPECTOMY;  Surgeon: Yetta Flock, MD;  Location: [facility];  Service: Gastroenterology;;  . RIGHT HEART CATH N/A 02/15/2020   Procedure: RIGHT HEART CATH;  Surgeon: Jolaine Artist, MD;  Location: [facility];  Service: Cardiovascular;  Laterality: N/A;  . TONSILLECTOMY AND ADENOIDECTOMY     age 71  . TUBAL LIGATION     years ago  . UPPER GASTROINTESTINAL ENDOSCOPY    . UPPER GI ENDOSCOPY  11/28/2009   mild erosive esophagitis, mild erosive changes in stomach, chronic constipation    Current Medications: Current Meds  Medication Sig  . ACCU-CHEK AVIVA PLUS test strip USE  TO TEST BLOOD SUGAR ONE TIME DAILY  . Accu-Chek Softclix Lancets lancets USE  AS  INSTRUCTED (Patient taking differently: 1 each by Other route as needed.)  . Alcohol Swabs (B-D SINGLE USE SWABS REGULAR) PADS USE  TO TEST BLOOD SUGAR ONE TIME DAILY (Patient taking differently: 1 each by Other route as needed (Clean skin before blood sugar reading).)  . aspirin 81 MG tablet Take 81 mg by mouth at bedtime.   [name] atorvastatin (LIPITOR) 40 MG tablet TAKE 1 TABLET EVERY DAY  . budesonide (PULMICORT) 0.5 MG/2ML  nebulizer solution Take 2 mLs (0.5 mg total) by nebulization daily.  . carvedilol (COREG) 3.125 MG tablet Take 1 tablet (3.125 mg total) by mouth 2 (two) times daily with a meal.  . cholecalciferol (VITAMIN D3) 25 MCG (1000 UNIT) tablet Take 1,000 Units by mouth as directed. M-W-F  . ferrous sulfate 325 (65 FE) MG tablet Take 325 mg by mouth as directed. M-W-F  . ipratropium-albuterol (DUONEB) 0.5-2.5 (3) MG/3ML SOLN Take 3 mLs by nebulization every 6 (six) hours as needed (wheezing, SOB).  [name] linaclotide (LINZESS) 145 MCG CAPS capsule Take 1 capsule (145 mcg total) by mouth daily before breakfast.  . mirabegron ER (MYRBETRIQ) 50 MG TB24 tablet Take 50 mg by mouth as needed.  Overactive bladder  . NONFORMULARY OR COMPOUNDED ITEM Antifungal solution: Terbinafine 3%, Fluconazole 2%, Tea Tree Oil 5%, Urea 10%, Ibuprofen 2% in DMSO suspension #14m  . OXYGEN Inhale 2 L into the lungs continuous.   . potassium chloride (KLOR-CON) 10 MEQ tablet TAKE 2 TABLETS(20 MEQ) BY MOUTH DAILY  . torsemide (DEMADEX) 20 MG tablet Take 3 tablets (60 mg total) by mouth as needed.  . [DISCONTINUED] amLODipine (NORVASC) 10 MG tablet Take 10 mg by mouth daily. Take 5-10 mg at bedtime as needed for elevated blood pressure     Allergies:   Benazepril hcl, Lotrel [amlodipine besy-benazepril hcl], and Talwin [pentazocine]   Social History   Socioeconomic History  . Marital status: Widowed    Spouse name: Not on file  . Number of children: Not on file  . Years of education: Not on file  . Highest education level: Not on file  Occupational History  . Not on file  Tobacco Use  . Smoking status: Former Smoker    Quit date: 03/14/2009    Years since quitting: 11.4  . Smokeless tobacco: Never Used  . Tobacco comment: quit smoking 2010  Vaping Use  . Vaping Use: Never used  Substance and Sexual Activity  . Alcohol use: No  . Drug use: No  . Sexual activity: Not on file  Other Topics Concern  . Not on file  Social  History Narrative   Lives alone.  2 daughters and 1 granddaughter in [place]  Social Determinants of Health   Financial Resource Strain: Not on file  Food Insecurity: No Food Insecurity  . Worried About RCharity fundraiserin the Last Year: Never true  . Ran Out of Food in the Last Year: Never true  Transportation Needs: No Transportation Needs  . Lack of Transportation (Medical): No  . Lack of Transportation (Non-Medical): No  Physical Activity: Not on file  Stress: No Stress Concern Present  . Feeling of Stress : Not at all  Social Connections: Moderately Integrated  . Frequency of Communication with Friends and Family: More than three times a week  . Frequency of Social Gatherings with Friends and Family: More than three times a week  . Attends Religious Services: More than 4 times per year  . Active Member of Clubs or Organizations: Yes  . Attends CArchivistMeetings: 1 to 4 times per year  . Marital Status: Widowed     Family History: The patient's family history is significant for a history of coronary artery disease at advanced ages.  And stroke at advanced ages ROS:   Please see the history of present illness.    All other systems are reviewed and are negative.   EKGs/Labs/Other Studies Reviewed:    The following studies were reviewed today: Comprehensive ICD check.  ECHO 02/18/2020 1. Left ventricular ejection fraction, by estimation, is 45 to 50%. The  left ventricle has mildly decreased function. The left ventricle  demonstrates regional wall motion abnormalities. The left ventricular  internal cavity size was mildly dilated. There  is the interventricular septum is flattened in systole and diastole,  consistent with right ventricular pressure and volume overload.  2. Right ventricular systolic function is moderately reduced. The right  ventricular size is moderately enlarged. There is moderately elevated  pulmonary artery systolic pressure. The  estimated right ventricular  systolic pressure is 433.5mmHg.  3. Right atrial size was severely dilated.  4. The mitral valve is normal in structure. Trivial mitral  valve  regurgitation.  5. The tricuspid valve is abnormal. Tricuspid valve regurgitation is  moderate to severe.  6. The aortic valve is tricuspid. Aortic valve regurgitation is not  visualized. No aortic stenosis is present.  7. The inferior vena cava is dilated in size with <50% respiratory  variability, suggesting right atrial pressure of 15 mmHg.  EKG:  EKG is ordered today. Atrial paced, ventricular sensed rhythm, long AV delay 224 ms, QTc 493 ms. Recent Labs: 03/04/2020: B Natriuretic Peptide 466.5 03/13/2020: ALT 12; BUN 63; Creatinine, Ser 2.38; Hemoglobin 11.6; Magnesium 2.8; Platelets 257; Potassium 4.9; Sodium 144  Recent Lipid Panel    Component Value Date/Time   CHOL 159 10/04/2019 1444   TRIG 62 10/04/2019 1444   HDL 109 10/04/2019 1444   CHOLHDL 1.5 10/04/2019 1444   CHOLHDL 2.3 06/25/2007 0700   VLDL 11 06/25/2007 0700   LDLCALC 38 10/04/2019 1444    Physical Exam:    VS:  BP (!) 158/64   Pulse 62   Ht _0  (1.575 m)   Wt 157 lb (71.2 kg)   SpO2 100%   BMI 28.72 kg/m     Wt Readings from Last 3 Encounters:  07/30/20 157 lb (71.2 kg)  06/05/20 154 lb 14.4 oz (70.3 kg)  05/16/20 153 lb (69.4 kg)     General: Alert, oriented x3, no distress, healthy ICD site Head: no evidence of trauma, PERRL, EOMI, no exophtalmos or lid lag, no myxedema, no xanthelasma; normal ears, nose and oropharynx Neck: normal jugular venous pulsations and no hepatojugular reflux; brisk carotid pulses without delay and no carotid bruits Chest: clear to auscultation, no signs of consolidation by percussion or palpation, normal fremitus, symmetrical and full respiratory excursions Cardiovascular: normal position and quality of the apical impulse, regular rhythm, normal first and second heart sounds, no murmurs, rubs or  gallops Abdomen: no tenderness or distention, no masses by palpation, no abnormal pulsatility or arterial bruits, normal bowel sounds, no hepatosplenomegaly Extremities: no clubbing, cyanosis or edema; 2+ radial, ulnar and brachial pulses bilaterally; 2+ right femoral, posterior tibial and dorsalis pedis pulses; 2+ left femoral, posterior tibial and dorsalis pedis pulses; no subclavian or femoral bruits Neurological: grossly nonfocal Psych: Normal mood and affect   ASSESSMENT:    1. Chronic combined systolic (congestive) and diastolic (congestive) heart failure ([place])   2. PAH (pulmonary artery hypertension) ([place])   3. ICD (implantable cardioverter-defibrillator) in place   4. PAT (paroxysmal atrial tachycardia) ([place])   5. Essential hypertension   6. CKD (chronic kidney disease) stage 4, GFR 15-29 ml/min (HCC)   7. Type 2 diabetes, controlled, with peripheral neuropathy ([place])   8. Hypercholesterolemia   9. Coronary artery disease involving native coronary artery of native heart without angina pectoris   10. OSA on CPAP    PLAN:    In order of problems listed above:  1. CHF: They are doing an excellent job with sodium restriction, weight monitoring and diuretic dose adjustment.  The dry weight is about 150 pounds 2 pounds and to achieve this she requires supplemental diuretics 2 or 3 times a week.  She has primarily diastolic dysfunction with LVEF most recently estimated be 45-50%. 2. PAH: This is severe and largely independent of the left heart failure.  The mean PA pressure was 53 mmHg when the mean wedge pressure was 18 mmHg at the recent right heart catheterization (transpulmonary gradient 35 mmHg).  It is likely related to longstanding undertreated sleep disordered breathing.  There  is no evidence of significant fibrotic lung disease on high-resolution CT, although there is some evidence of restriction by pulmonary function tests.  Reinforced importance of compliance with oxygen to avoid  periods of hypoxemia that would further worsen her pulmonary hypertension. 3. ICD: Normal device function, although her RV lead is under advisory.  No plan for revision unless actual events occur.  Does not have OptiVol.  Has never received therapies from her device.  Does not require ventricular pacing.  Continue remote downloads every 3 months. 4. PAT: Sustained episode in May 2021 lasting for about an hour, well rate controlled and asymptomatic. None since then. On carvedilol.  True atrial fibrillation has not been reported. 5. HTN: Less than perfect control. Amlodipine scheduled 5 mg daily, not prn. 6. CKD 4: Baseline creatinine now seems to be around 2.4. 7. DM: Diet controlled.   8. HLP: Excellent lipid profile on current medications. Outstanding HDL of 109 9. CAD: Asymptomatic.  Minor plaque at her last cardiac catheterization and minor carotid plaque by ultrasound.  On low-dose aspirin. 10. OSA: recommend 100% compliance w CPAP.  Patient Instructions  Medication Instructions:  TAKE: Amlodipine 5 mg once daily  *If you need a refill on your cardiac medications before your next appointment, please call your pharmacy*   Lab Work: None ordered If you have labs (blood work) drawn today and your tests are completely normal, you will receive your results only by: [name] MyChart Message (if you have MyChart) OR . A paper copy in the mail If you have any lab test that is abnormal or we need to change your treatment, we will call you to review the results.   Testing/Procedures: None ordered   Follow-Up: At [facility], you and your health needs are our priority.  As part of our continuing mission to provide you with exceptional heart care, we have created designated Provider Care Teams.  These Care Teams include your primary Cardiologist (physician) and Advanced Practice Providers (APPs -  Physician Assistants and Nurse Practitioners) who all work together to provide you with the care you  need, when you need it.  We recommend signing up for the patient portal called "MyChart".  Sign up information is provided on this After Visit Summary.  MyChart is used to connect with patients for Virtual Visits (Telemedicine).  Patients are able to view lab/test results, encounter notes, upcoming appointments, etc.  Non-urgent messages can be sent to your provider as well.   To learn more about what you can do with MyChart, go to [email or web address].    Your next appointment:   12 month(s)  The format for your next appointment:   In Person  Provider:   Sanda Klein, MD   Other Instructions Dr. [name] would like you to check your blood pressure daily for the next 2 weeks.  Keep a journal of these daily blood pressure and heart rate readings and call our office or send a message through [facility] with the results. Thank you!  It is best to check your BP 1-2 hours after taking your medications to see the medications effectiveness on your BP.    Here are some tips that our clinical pharmacists share for home BP monitoring:          Rest 5 minutes before taking your blood pressure.          Don't smoke or drink caffeinated beverages for at least 30 minutes before.          Take your  blood pressure before (not after) you eat.          Sit comfortably with your back supported and both feet on the floor (don't cross your legs).          Elevate your arm to heart level on a table or a desk.          Use the proper sized cuff. It should fit smoothly and snugly around your bare upper arm. There should be enough room to slip a fingertip under the cuff. The bottom edge of the cuff should be 1 inch above the crease of the elbow.      Medication Adjustments/Labs and Tests Ordered: Patient Current medicines are reviewed at length with the patient today.  Concerns regarding medicines are outlined above.  No orders of the defined types were placed in this encounter.  Meds ordered this  encounter  Medications  . amLODipine (NORVASC) 5 MG tablet    Sig: Take 1 tablet (5 mg total) by mouth at bedtime.    Dispense:  90 tablet    Refill:  3    Signed, Sanda Klein, MD  08/04/2020 5:28 PM    [place]

## 2020-07-30 NOTE — Patient Instructions (Signed)
Medication Instructions:  TAKE: Amlodipine 5 mg once daily  *If you need a refill on your cardiac medications before your next appointment, please call your pharmacy*   Lab Work: None ordered If you have labs (blood work) drawn today and your tests are completely normal, you will receive your results only by: [name] MyChart Message (if you have MyChart) OR . A paper copy in the mail If you have any lab test that is abnormal or we need to change your treatment, we will call you to review the results.   Testing/Procedures: None ordered   Follow-Up: At [facility], you and your health needs are our priority.  As part of our continuing mission to provide you with exceptional heart care, we have created designated Provider Care Teams.  These Care Teams include your primary Cardiologist (physician) and Advanced Practice Providers (APPs -  Physician Assistants and Nurse Practitioners) who all work together to provide you with the care you need, when you need it.  We recommend signing up for the patient portal called "MyChart".  Sign up information is provided on this After Visit Summary.  MyChart is used to connect with patients for Virtual Visits (Telemedicine).  Patients are able to view lab/test results, encounter notes, upcoming appointments, etc.  Non-urgent messages can be sent to your provider as well.   To learn more about what you can do with MyChart, go to [email or web address].    Your next appointment:   12 month(s)  The format for your next appointment:   In Person  Provider:   Sanda Klein, MD   Other Instructions Dr. [name] would like you to check your blood pressure daily for the next 2 weeks.  Keep a journal of these daily blood pressure and heart rate readings and call our office or send a message through [facility] with the results. Thank you!  It is best to check your BP 1-2 hours after taking your medications to see the medications effectiveness on your BP.     Here are some tips that our clinical pharmacists share for home BP monitoring:          Rest 5 minutes before taking your blood pressure.          Don't smoke or drink caffeinated beverages for at least 30 minutes before.          Take your blood pressure before (not after) you eat.          Sit comfortably with your back supported and both feet on the floor (don't cross your legs).          Elevate your arm to heart level on a table or a desk.          Use the proper sized cuff. It should fit smoothly and snugly around your bare upper arm. There should be enough room to slip a fingertip under the cuff. The bottom edge of the cuff should be 1 inch above the crease of the elbow.

## 2020-08-04 ENCOUNTER — Encounter: Payer: Self-pay | Admitting: Cardiovascular Disease

## 2020-08-05 ENCOUNTER — Ambulatory Visit: Payer: Medicare PPO | Admitting: Primary Care

## 2020-08-05 ENCOUNTER — Ambulatory Visit: Payer: Medicare PPO | Admitting: Podiatry

## 2020-08-05 ENCOUNTER — Other Ambulatory Visit: Payer: Medicare PPO | Admitting: Orthotics

## 2020-08-06 ENCOUNTER — Other Ambulatory Visit: Payer: Self-pay | Admitting: Gastroenterology

## 2020-08-07 DIAGNOSIS — J449 Chronic obstructive pulmonary disease, unspecified: Secondary | ICD-10-CM | POA: Diagnosis not present

## 2020-08-07 DIAGNOSIS — I5089 Other heart failure: Secondary | ICD-10-CM | POA: Diagnosis not present

## 2020-08-07 DIAGNOSIS — G4733 Obstructive sleep apnea (adult) (pediatric): Secondary | ICD-10-CM | POA: Diagnosis not present

## 2020-08-07 DIAGNOSIS — I10 Essential (primary) hypertension: Secondary | ICD-10-CM | POA: Diagnosis not present

## 2020-08-07 DIAGNOSIS — R531 Weakness: Secondary | ICD-10-CM | POA: Diagnosis not present

## 2020-08-12 ENCOUNTER — Other Ambulatory Visit: Payer: Self-pay | Admitting: Cardiovascular Disease

## 2020-08-16 ENCOUNTER — Encounter: Payer: Self-pay | Admitting: Primary Care

## 2020-08-16 ENCOUNTER — Encounter: Payer: Self-pay | Admitting: Podiatry

## 2020-08-16 ENCOUNTER — Ambulatory Visit: Payer: Medicare PPO | Admitting: Podiatry

## 2020-08-16 ENCOUNTER — Other Ambulatory Visit: Payer: Self-pay

## 2020-08-16 ENCOUNTER — Ambulatory Visit: Payer: Medicare PPO | Admitting: Primary Care

## 2020-08-16 DIAGNOSIS — G4733 Obstructive sleep apnea (adult) (pediatric): Secondary | ICD-10-CM

## 2020-08-16 DIAGNOSIS — Z9989 Dependence on other enabling machines and devices: Secondary | ICD-10-CM | POA: Diagnosis not present

## 2020-08-16 DIAGNOSIS — M79675 Pain in left toe(s): Secondary | ICD-10-CM

## 2020-08-16 DIAGNOSIS — I5043 Acute on chronic combined systolic (congestive) and diastolic (congestive) heart failure: Secondary | ICD-10-CM | POA: Diagnosis not present

## 2020-08-16 DIAGNOSIS — J449 Chronic obstructive pulmonary disease, unspecified: Secondary | ICD-10-CM | POA: Diagnosis not present

## 2020-08-16 DIAGNOSIS — E1142 Type 2 diabetes mellitus with diabetic polyneuropathy: Secondary | ICD-10-CM

## 2020-08-16 DIAGNOSIS — J9611 Chronic respiratory failure with hypoxia: Secondary | ICD-10-CM

## 2020-08-16 DIAGNOSIS — B351 Tinea unguium: Secondary | ICD-10-CM

## 2020-08-16 DIAGNOSIS — M79674 Pain in right toe(s): Secondary | ICD-10-CM

## 2020-08-16 DIAGNOSIS — L84 Corns and callosities: Secondary | ICD-10-CM | POA: Diagnosis not present

## 2020-08-16 NOTE — Patient Instructions (Signed)
Recommendations - Continue to wear CPAP every night for minimum of 6 hours or more - Continue budesonide 1-2 times daily - Use ipratropium albuterol nebulizer every 6 hours for breakthrough shortness of breath or wheezing - Need to take torsemide 60 mg as needed for weight gain - We need to contact [employer] to get CPT code for your paper work, we can either mail this to you or you can pick it up next week   Follow-up - 45-monthfollow-up with Dr. SHalford Chessman  Heart Failure, Diagnosis  Heart failure is a condition in which the heart has trouble pumping blood. This may mean that the heart cannot pump enough blood out to the body or that the heart does not fill up with enough blood. For some people with heart failure, fluid may back up into the lungs. There may also be swelling (edema) in the lower legs. Heart failure is usually a long-term (chronic) condition. It is important for you to take good care of yourself and follow the treatment plan from your health care provider. What are the causes? This condition may be caused by:  High blood pressure (hypertension). Hypertension causes the heart muscle to work harder than normal.  Coronary artery disease, or CAD. CAD is the buildup of cholesterol and fat (plaque) in the arteries of the heart.  Heart attack, also called myocardial infarction. This injures the heart muscle, making it hard for the heart to pump blood.  Abnormal heart valves. The valves do not open and close properly, forcing the heart to pump harder to keep the blood flowing.  Heart muscle disease, inflammation, or infection (cardiomyopathy or myocarditis). This is damage to the heart muscle. It can increase the risk of heart failure.  Lung disease. The heart works harder when the lungs are not healthy. What increases the risk? The risk of heart failure increases as a person ages. This condition is also more likely to develop in people who:  Are obese.  Are female.  Use tobacco or  nicotine products.  Abuse alcohol or drugs.  Have taken medicines that can damage the heart, such as chemotherapy drugs.  Have any of these conditions: ? Diabetes. ? Abnormal heart rhythms. ? Thyroid problems. ? Low blood counts (anemia). ? Chronic kidney disease.  Have a family history of heart failure. What are the signs or symptoms? Symptoms of this condition include:  Shortness of breath with activity, such as when climbing stairs.  A cough that does not go away.  Swelling of the feet, ankles, legs, or abdomen.  Losing or gaining weight for no reason.  Trouble breathing when lying flat.  Waking from sleep because of the need to sit up and get more air.  Rapid heartbeat.  Tiredness (fatigue) and loss of energy.  Feeling light-headed, dizzy, or close to fainting.  Nausea or loss of appetite.  Waking up more often during the night to urinate (nocturia).  Confusion. How is this diagnosed? This condition is diagnosed based on:  Your medical history, symptoms, and a physical exam.  Diagnostic tests, which may include: ? Echocardiogram. ? Electrocardiogram (ECG). ? Chest X-ray. ? Blood tests. ? Exercise stress test. ? Cardiac MRI. ? Cardiac catheterization and angiogram. ? Radionuclide scans. How is this treated? Treatment for this condition is aimed at managing the symptoms of heart failure. Medicines Treatment may include medicines that:  Help lower blood pressure by relaxing (dilating) the blood vessels. These medicines are called ACE inhibitors (angiotensin-converting enzyme), ARBs (angiotensin receptor blockers),  or vasodilators.  Cause the kidneys to remove salt and water from the blood through urination (diuretics).  Improve heart muscle strength and prevent the heart from beating too fast (beta blockers).  Increase the force of the heartbeat (digoxin).  Lower heart rates. Certain diabetes medicines (SGLT-2 inhibitors) may also be used in  treatment. Healthy behavior changes Treatment may also include making healthy lifestyle changes, such as:  Reaching and staying at a healthy weight.  Not using tobacco or nicotine products.  Eating heart-healthy foods.  Limiting or avoiding alcohol.  Stopping the use of illegal drugs.  Being physically active.  Participating in a cardiac rehabilitation program, which is a treatment program to improve your health and well-being through exercise training, education, and counseling. Other treatments Other treatments may include:  Procedures to open blocked arteries or repair damaged valves.  Placing a pacemaker to improve heart function (cardiac resynchronization therapy).  Placing a device to treat serious abnormal heart rhythms (implantable cardioverter defibrillator, or ICD).  Placing a device to improve the pumping ability of the heart (left ventricular assist device, or LVAD).  Receiving a healthy heart from a donor (heart transplant). This is done when other treatments have not helped. Follow these instructions at home:  Manage other health conditions as told by your health care provider. These may include hypertension, diabetes, thyroid disease, or abnormal heart rhythms.  Get ongoing education and support as needed. Learn as much as you can about heart failure.  Keep all follow-up visits. This is important. Summary  Heart failure is a condition in which the heart has trouble pumping blood.  This condition is commonly caused by high blood pressure and other diseases of the heart and lungs.  Symptoms of this condition include shortness of breath, tiredness (fatigue), nausea, and swelling of the feet, ankles, legs, or abdomen.  Treatments for this condition may include medicines, lifestyle changes, and surgery.  Manage other health conditions as told by your health care provider. This information is not intended to replace advice given to you by your health care  provider. Make sure you discuss any questions you have with your health care provider. Document Revised: 01/27/2020 Document Reviewed: 01/27/2020 Elsevier Patient Education  2021 [place].   CPAP and BPAP Information CPAP and BPAP are methods that use air pressure to keep your airways open and to help you breathe well. CPAP and BPAP use different amounts of pressure. Your health care provider will tell you whether CPAP or BPAP would be more helpful for you.  CPAP stands for "continuous positive airway pressure." With CPAP, the amount of pressure stays the same while you breathe in and out.  BPAP stands for "bi-level positive airway pressure." With BPAP, the amount of pressure will be higher when you breathe in (inhale) and lower when you breathe out(exhale). This allows you to take larger breaths. CPAP or BPAP may be used in the hospital, or your health care provider may want you to use it at home. You may need to have a sleep study before your health care provider can order a machine for you to use at home. Why are CPAP and BPAP treatments used? CPAP or BPAP can be helpful if you have:  Sleep apnea.  Chronic obstructive pulmonary disease (COPD).  Heart failure.  Medical conditions that cause muscle weakness, including muscular dystrophy or amyotrophic lateral sclerosis (ALS).  Other problems that cause breathing to be shallow, weak, abnormal, or difficult. CPAP and BPAP are most commonly used for obstructive  sleep apnea (OSA) to keep the airways from collapsing when the muscles relax during sleep. How is CPAP or BPAP administered? Both CPAP and BPAP are provided by a small machine with a flexible plastic tube that attaches to a plastic mask that you wear. Air is blown through the mask into your nose or mouth. The amount of pressure that is used to blow the air can be adjusted on the machine. Your health care provider will set the pressure setting and help you find the best mask for  you. When should CPAP or BPAP be used? In most cases, the mask only needs to be worn during sleep. Generally, the mask needs to be worn throughout the night and during any daytime naps. People with certain medical conditions may also need to wear the mask at other times when they are awake. Follow instructions from your health care provider about when to use the machine. What are some tips for using the mask?  Because the mask needs to be snug, some people feel trapped or closed-in (claustrophobic) when first using the mask. If you feel this way, you may need to get used to the mask. One way to do this is to hold the mask loosely over your nose or mouth and then gradually apply the mask more snugly. You can also gradually increase the amount of time that you use the mask.  Masks are available in various types and sizes. If your mask does not fit well, talk with your health care provider about getting a different one. Some common types of masks include: ? Full face masks, which fit over the mouth and nose. ? Nasal masks, which fit over the nose. ? Nasal pillow or prong masks, which fit into the nostrils.  If you are using a mask that fits over your nose and you tend to breathe through your mouth, a chin strap may be applied to help keep your mouth closed.  Some CPAP and BPAP machines have alarms that may sound if the mask comes off or develops a leak.  If you have trouble with the mask, it is very important that you talk with your health care provider about finding a way to make the mask easier to tolerate. Do not stop using the mask. There could be a negative impact to your health if you stop using the mask.   What are some tips for using the machine?  Place your CPAP or BPAP machine on a secure table or stand near an electrical outlet.  Know where the on/off switch is on the machine.  Follow instructions from your health care provider about how to set the pressure on your machine and when you  should use it.  Do not eat or drink while the CPAP or BPAP machine is on. Food or fluids could get pushed into your lungs by the pressure of the CPAP or BPAP.  For home use, CPAP and BPAP machines can be rented or purchased through home health care companies. Many different brands of machines are available. Renting a machine before purchasing may help you find out which particular machine works well for you. Your insurance may also decide which machine you may get.  Keep the CPAP or BPAP machine and attachments clean. Ask your health care provider for specific instructions. Follow these instructions at home:  Do not use any products that contain nicotine or tobacco, such as cigarettes, e-cigarettes, and chewing tobacco. If you need help quitting, ask your health care provider.  Keep all follow-up visits as told by your health care provider. This is important. Contact a health care provider if:  You have redness or pressure sores on your head, face, mouth, or nose from the mask or head gear.  You have trouble using the CPAP or BPAP machine.  You cannot tolerate wearing the CPAP or BPAP mask.  Someone tells you that you snore even when wearing your CPAP or BPAP. Get help right away if:  You have trouble breathing.  You feel confused. Summary  CPAP and BPAP are methods that use air pressure to keep your airways open and to help you breathe well.  You may need to have a sleep study before your health care provider can order a machine for home use.  If you have trouble with the mask, it is very important that you talk with your health care provider about finding a way to make the mask easier to tolerate. Do not stop using the mask. There could be a negative impact to your health if you stop using the mask.  Follow instructions from your health care provider about when to use the machine. This information is not intended to replace advice given to you by your health care provider. Make  sure you discuss any questions you have with your health care provider. Document Revised: 07/28/2019 Document Reviewed: 07/31/2019 Elsevier Patient Education  2021 [employer].

## 2020-08-16 NOTE — Progress Notes (Signed)
_0  ID: [name], female    DOB: 1940/10/03, 80 y.o.   MRN: [id number]  Chief Complaint  Patient presents with  . Follow-up    3 month f/u for COPD and OSA. States she has been doing well since last visit. Still using her CPAP nightly.     Referring provider: Girtha Rm, NP-C  HPI: 80 year old female, former smoker quit in August 2010.  Past medical history significant for COPD, on CPAP, restrictive lung disease, chronic respiratory failure with hypoxia, cardiomyopathy, chronic combined systolic and diastolic heart failure, coronary artery disease, pulmonary artery hypertension, type 2 diabetes, malignant neoplasm of rectum, chronic kidney disease.  Patient of Dr. Halford Chessman, last seen by pulmonary nurse practitioner on 05/09/2020.  08/16/2020 Patient presents today for 3 month follow-up. She is doing well, no acute complaints. She uses Pulmicort once a day and rarely requires Albuterol rescue inhaler. She is 100% compliant with CPAP therapy. No issues with mask fit or pressure setting. She is scheduled for upcoming pulmonary function test with Dr. Haroldine Laws. Denies f/c/s, cough, shortness of breath, chest tightness, wheezing.    Review download 07/15/2020-08/13/2020 Usage 30/30 days: 100% greater than 4 hours Average usage 9 hours 20 minutes Pressure 4-20 cm H2O (19.1-85 percentile) Air leaks 11.4 L/min (95 percentile) AHI 2.2  Allergies  Allergen Reactions  . Benazepril Hcl Other (See Comments)    Unknown  . Lotrel [Amlodipine Besy-Benazepril Hcl] Other (See Comments)    Hypotension   . Talwin [Pentazocine] Other (See Comments)    Hallucinations      Immunization History  Administered Date(s) Administered  . Fluad Quad(high Dose 65+) 04/04/2019, 04/15/2020  . Influenza, High Dose Seasonal PF 08/04/2016, 05/02/2017, 04/06/2018  . Influenza-Unspecified 05/23/2013, 08/25/2013, 06/22/2014, 06/22/2014, 05/22/2015  . PFIZER(Purple Top)SARS-COV-2 Vaccination 09/12/2019,  10/03/2019, 04/30/2020  . Pneumococcal Conjugate-13 06/22/2014  . Pneumococcal-Unspecified 01/12/2008  . Tdap 01/12/2008  . Zoster 07/21/2011, 12/08/2011    Past Medical History:  Diagnosis Date  . Anemia   . CAD (coronary artery disease)   . Cardiac defibrillator in situ   . Cataract    bilateral 2017  . CHF (congestive heart failure) ([place])   . Chronic systolic heart failure ([place])   . CKD (chronic kidney disease) stage 4, GFR 15-29 ml/min (HCC)   . COPD (chronic obstructive pulmonary disease) ([place])   . Dyspnea   . HTN (hypertension)   . Malignant neoplasm of rectum ([place]) 09/08/2017   Dr. Rollene Rotunda in [place]. Per notes she was due for colonoscopy in 12/2016  . OSA on CPAP 09/08/2017  . Other cardiomyopathies ([place])   . Oxygen deficiency    2 liters at night  . Pulmonary HTN ([place])   . Sleep apnea    uses c-pap  . Stroke ([place])   . Type 2 diabetes, controlled, with peripheral neuropathy ([place])   . Urge incontinence of urine     Tobacco History: Social History   Tobacco Use  Smoking Status Former Smoker  . Quit date: 03/14/2009  . Years since quitting: 11.5  Smokeless Tobacco Never Used  Tobacco Comment   quit smoking 2010   Counseling given: Not Answered Comment: quit smoking 2010   Outpatient Medications Prior to Visit  Medication Sig Dispense Refill  . Alcohol Swabs (B-D SINGLE USE SWABS REGULAR) PADS USE  TO TEST BLOOD SUGAR ONE TIME DAILY 100 each 1  . amLODipine (NORVASC) 5 MG tablet Take 1 tablet (5 mg total) by mouth at bedtime. 90 tablet 3  .  aspirin 81 MG tablet Take 81 mg by mouth at bedtime.     . budesonide (PULMICORT) 0.5 MG/2ML nebulizer solution Take 2 mLs (0.5 mg total) by nebulization daily. 2 mL 5  . carvedilol (COREG) 3.125 MG tablet TAKE 1 TABLET TWICE DAILY WITH MEALS 180 tablet 0  . cholecalciferol (VITAMIN D3) 25 MCG (1000 UNIT) tablet Take 1,000 Units by mouth as directed. M-W-F    . ferrous sulfate 325 (65 FE) MG tablet Take 325 mg by mouth as  directed. M-W-F    . LINZESS 145 MCG CAPS capsule TAKE 1 CAPSULE EVERY DAY BEFORE BREAKFAST 90 capsule 1  . mirabegron ER (MYRBETRIQ) 50 MG TB24 tablet Take 50 mg by mouth as needed. Overactive bladder    . NONFORMULARY OR COMPOUNDED ITEM Antifungal solution: Terbinafine 3%, Fluconazole 2%, Tea Tree Oil 5%, Urea 10%, Ibuprofen 2% in DMSO suspension #63m 1 each 3  . OXYGEN Inhale 2 L into the lungs continuous.     . potassium chloride (KLOR-CON) 10 MEQ tablet TAKE 2 TABLETS(20 MEQ) BY MOUTH DAILY 180 tablet 3  . torsemide (DEMADEX) 20 MG tablet Take 3 tablets (60 mg total) by mouth as needed. 90 tablet 1  . ACCU-CHEK AVIVA PLUS test strip USE  TO TEST BLOOD SUGAR ONE TIME DAILY 100 strip 1  . Accu-Chek Softclix Lancets lancets USE  AS  INSTRUCTED (Patient taking differently: 1 each by Other route as needed.) 100 each 2  . atorvastatin (LIPITOR) 40 MG tablet TAKE 1 TABLET EVERY DAY 90 tablet 0  . ipratropium-albuterol (DUONEB) 0.5-2.5 (3) MG/3ML SOLN Take 3 mLs by nebulization every 6 (six) hours as needed (wheezing, SOB). 360 mL 3   No facility-administered medications prior to visit.   Review of Systems  Review of Systems  Constitutional: Negative.   Respiratory: Negative.    Physical Exam  BP 116/82   Pulse 70   Temp (!) 97.2 F (36.2 C) (Temporal)   Ht _0  (1.575 m)   Wt 154 lb 3.2 oz (69.9 kg)   SpO2 94% Comment: on 2L pulsed  BMI 28.20 kg/m  Physical Exam Constitutional:      General: She is not in acute distress.    Appearance: Normal appearance. She is not ill-appearing.  HENT:     Head: Normocephalic and atraumatic.  Cardiovascular:     Rate and Rhythm: Normal rate and regular rhythm.  Pulmonary:     Breath sounds: Rales present.     Comments: Faint rales right mid-lung Skin:    General: Skin is warm and dry.  Neurological:     General: No focal deficit present.     Mental Status: She is alert and oriented to person, place, and time. Mental status is at  baseline.  Psychiatric:        Mood and Affect: Mood normal.        Behavior: Behavior normal.        Thought Content: Thought content normal.        Judgment: Judgment normal.      Lab Results:  CBC    Component Value Date/Time   WBC 3.3 (L) 03/13/2020 1405   WBC 3.7 (L) 03/05/2020 0402   RBC 4.16 03/13/2020 1405   RBC 3.43 (L) 03/05/2020 0402   HGB 11.6 03/13/2020 1405   HCT 36.7 03/13/2020 1405   PLT 257 03/13/2020 1405   MCV 88 03/13/2020 1405   MCH 27.9 03/13/2020 1405   MCH 27.4 03/05/2020 0402   MCHC  31.6 03/13/2020 1405   MCHC 29.8 (L) 03/05/2020 0402   RDW 15.2 03/13/2020 1405   LYMPHSABS 0.7 03/13/2020 1405   MONOABS 0.6 03/05/2020 0402   EOSABS 0.2 03/13/2020 1405   BASOSABS 0.0 03/13/2020 1405    BMET    Component Value Date/Time   NA 144 03/13/2020 1405   K 4.9 03/13/2020 1405   CL 98 03/13/2020 1405   CO2 31 (H) 03/13/2020 1405   GLUCOSE 127 (H) 03/13/2020 1405   GLUCOSE 93 03/07/2020 0447   BUN 63 (H) 03/13/2020 1405   CREATININE 2.38 (H) 03/13/2020 1405   CALCIUM 9.8 03/13/2020 1405   GFRNONAA 19 (L) 03/13/2020 1405   GFRAA 21 (L) 03/13/2020 1405    BNP    Component Value Date/Time   BNP 466.5 (H) 03/04/2020 0940    ProBNP    Component Value Date/Time   PROBNP 847.0 (H) 07/03/2007 0550    Imaging: CUP PACEART REMOTE DEVICE CHECK  Result Date: 09/04/2020 Scheduled remote reviewed. Normal device function.  Next remote 91 days. HB    Assessment & Plan:   OSA on CPAP - Patient is 100% compliant with CPAP and reports benefit from use - Pressure 4-20cm h20 (19.1cm h20-95%); residual AHI 2.2  - Continue to wear CPAP every night for minimum of 6 hours or more - No changes today - FU in 6 months    COPD (chronic obstructive pulmonary disease) (HCC) - Stable interval; well controlled on present treatment. Rare SABA use - Continue Budesonide twice daily; ipratropium-albuterol nebulizer every 6 hours for breakthrough shortness of  breath or wheezing  Acute on chronic combined systolic and diastolic CHF (congestive heart failure) (HCC) - Appears euvolemic on exam - Continue torsemide 60 mg as needed  - Scheduled for PFTs with Dr. Haroldine Laws  Chronic respiratory failure with hypoxia ([place]) - Secondary to [place], heart failure and COPD - Continue 2L supplemental oxygen and CPAP    Martyn Ehrich, NP 09/10/2020

## 2020-08-18 DIAGNOSIS — I10 Essential (primary) hypertension: Secondary | ICD-10-CM | POA: Diagnosis not present

## 2020-08-18 DIAGNOSIS — J449 Chronic obstructive pulmonary disease, unspecified: Secondary | ICD-10-CM | POA: Diagnosis not present

## 2020-08-18 DIAGNOSIS — G4733 Obstructive sleep apnea (adult) (pediatric): Secondary | ICD-10-CM | POA: Diagnosis not present

## 2020-08-18 DIAGNOSIS — I5089 Other heart failure: Secondary | ICD-10-CM | POA: Diagnosis not present

## 2020-08-18 NOTE — Progress Notes (Signed)
Subjective:  Patient ID: [name], female    DOB: 1941-06-28,  MRN: [id number]  80 y.o. female presents with at risk foot care. Pt has h/o NIDDM with chronic kidney disease and painful thick toenails that are difficult to trim. Pain interferes with ambulation. Aggravating factors include wearing enclosed shoe gear. Pain is relieved with periodic professional debridement..    Her daughter is present during today's visit. They voice no new pedal concerns on today's visit.  PCP: Girtha Rm, NP-C and last visit was: 03/13/2020.  Review of Systems: Negative except as noted in the HPI.  Past Medical History:  Diagnosis Date  . Anemia   . CAD (coronary artery disease)   . Cardiac defibrillator in situ   . Cataract    bilateral 2017  . CHF (congestive heart failure) ([place])   . Chronic systolic heart failure ([place])   . CKD (chronic kidney disease) stage 4, GFR 15-29 ml/min (HCC)   . COPD (chronic obstructive pulmonary disease) ([place])   . Dyspnea   . HTN (hypertension)   . 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 Rollene Rotunda in [place]. Per notes she was due for colonoscopy in 12/2016  . OSA on CPAP 09/08/2017  . Other cardiomyopathies ([place])   . Oxygen deficiency    2 liters at night  . Pulmonary HTN ([place])   . Sleep apnea    uses c-pap  . Stroke ([place])   . Type 2 diabetes, controlled, with peripheral neuropathy ([place])   . Urge incontinence of urine    Past Surgical History:  Procedure Laterality Date  . BIOPSY  03/29/2018   Procedure: BIOPSY;  Surgeon: Yetta Flock, MD;  Location: [facility];  Service: Gastroenterology;;  . COLONOSCOPY    . COLONOSCOPY WITH PROPOFOL N/A 03/29/2018   Procedure: COLONOSCOPY WITH PROPOFOL;  Surgeon: Yetta Flock, MD;  Location: [facility];  Service: Gastroenterology;  Laterality: N/A;  . ESOPHAGOGASTRODUODENOSCOPY (EGD) WITH PROPOFOL N/A 03/29/2018   Procedure: ESOPHAGOGASTRODUODENOSCOPY (EGD) WITH PROPOFOL;  Surgeon:  Yetta Flock, MD;  Location: [facility];  Service: Gastroenterology;  Laterality: N/A;  . POLYPECTOMY  03/29/2018   Procedure: POLYPECTOMY;  Surgeon: Yetta Flock, MD;  Location: [facility];  Service: Gastroenterology;;  . RIGHT HEART CATH N/A 02/15/2020   Procedure: RIGHT HEART CATH;  Surgeon: Jolaine Artist, MD;  Location: [facility];  Service: Cardiovascular;  Laterality: N/A;  . TONSILLECTOMY AND ADENOIDECTOMY     age 74  . TUBAL LIGATION     years ago  . UPPER GASTROINTESTINAL ENDOSCOPY    . UPPER GI ENDOSCOPY  11/28/2009   mild erosive esophagitis, mild erosive changes in stomach, chronic constipation   Patient Active Problem List   Diagnosis Date Noted  . Acute on chronic systolic CHF (congestive heart failure) ([place]) 03/04/2020  . Transient hypotension 03/04/2020  . Acute renal failure superimposed on stage 4 chronic kidney disease ([place]) 03/04/2020  . Pulmonary hypertension, unspecified ([place])   . Acute on chronic combined systolic and diastolic CHF (congestive heart failure) ([place]) 02/12/2020  . Chronic respiratory failure with hypoxia ([place]) 02/12/2020  . CHF exacerbation ([place]) 02/12/2020  . D dimer value normal   . Diabetes mellitus type 2, diet-controlled ([place])   . Abnormal result of cardiovascular function study, unspecified 09/16/2018  . Callus 09/16/2018  . Claudication of both lower extremities ([place]) 09/16/2018  . Dependence on other enabling machines and devices 09/16/2018  . Dizziness 09/16/2018  . Dyspnea, unspecified 09/16/2018  .  Medicare annual wellness visit, subsequent 09/16/2018  . Onychomycosis due to dermatophyte 09/16/2018  . Overgrown toenails 09/16/2018  . Other secondary pulmonary hypertension ([place]) 09/16/2018  . Iron deficiency anemia   . On home O2   . Benign neoplasm of colon   . PAH (pulmonary artery hypertension) ([place]) 09/09/2017  . PAT (paroxysmal atrial tachycardia) ([place]) 09/09/2017  . Hypercholesterolemia  09/09/2017  . OSA on CPAP 09/08/2017  . 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 Type 2 diabetes, controlled, with peripheral neuropathy ([place])   . Other cardiomyopathies ([place])   . Chronic combined systolic (congestive) and diastolic (congestive) heart failure ([place])   . CKD (chronic kidney disease) stage 4, GFR 15-29 ml/min (HCC)   . ICD (implantable cardioverter-defibrillator) in place   . Urge incontinence of urine   . Coronary artery disease involving native coronary artery of native heart without angina pectoris   . Chronic kidney disease 09/06/2017  . Restrictive lung disease 05/04/2017  . Body mass index (bmi) 33.0-33.9, adult 06/13/2014  . DEPRESSION 07/26/2007  . Essential hypertension 07/26/2007  . CARDIOMYOPATHY 07/26/2007  . COPD (chronic obstructive pulmonary disease) ([place]) 07/26/2007    Current Outpatient Medications:  .  ACCU-CHEK AVIVA PLUS test strip, USE  TO TEST BLOOD SUGAR ONE TIME DAILY, Disp: 100 strip, Rfl: 1 .  Accu-Chek Softclix Lancets lancets, USE  AS  INSTRUCTED (Patient taking differently: 1 each by Other route as needed.), Disp: 100 each, Rfl: 2 .  Alcohol Swabs (B-D SINGLE USE SWABS REGULAR) PADS, USE  TO TEST BLOOD SUGAR ONE TIME DAILY (Patient taking differently: 1 each by Other route as needed (Clean skin before blood sugar reading).), Disp: 100 each, Rfl: 1 .  amLODipine (NORVASC) 5 MG tablet, Take 1 tablet (5 mg total) by mouth at bedtime., Disp: 90 tablet, Rfl: 3 .  aspirin 81 MG tablet, Take 81 mg by mouth at bedtime. , Disp: , Rfl:  .  atorvastatin (LIPITOR) 40 MG tablet, TAKE 1 TABLET EVERY DAY, Disp: 90 tablet, Rfl: 0 .  budesonide (PULMICORT) 0.5 MG/2ML nebulizer solution, Take 2 mLs (0.5 mg total) by nebulization daily., Disp: 2 mL, Rfl: 5 .  carvedilol (COREG) 3.125 MG tablet, TAKE 1 TABLET TWICE DAILY WITH MEALS, Disp: 180 tablet, Rfl: 0 .  cholecalciferol (VITAMIN D3) 25 MCG (1000 UNIT) tablet, Take 1,000 Units by mouth as directed.  M-W-F, Disp: , Rfl:  .  ferrous sulfate 325 (65 FE) MG tablet, Take 325 mg by mouth as directed. M-W-F, Disp: , Rfl:  .  ipratropium-albuterol (DUONEB) 0.5-2.5 (3) MG/3ML SOLN, Take 3 mLs by nebulization every 6 (six) hours as needed (wheezing, SOB)., Disp: 360 mL, Rfl: 3 .  LINZESS 145 MCG CAPS capsule, TAKE 1 CAPSULE EVERY DAY BEFORE BREAKFAST, Disp: 90 capsule, Rfl: 1 .  mirabegron ER (MYRBETRIQ) 50 MG TB24 tablet, Take 50 mg by mouth as needed. Overactive bladder, Disp: , Rfl:  .  NONFORMULARY OR COMPOUNDED ITEM, Antifungal solution: Terbinafine 3%, Fluconazole 2%, Tea Tree Oil 5%, Urea 10%, Ibuprofen 2% in DMSO suspension #66m, Disp: 1 each, Rfl: 3 .  OXYGEN, Inhale 2 L into the lungs continuous. , Disp: , Rfl:  .  potassium chloride (KLOR-CON) 10 MEQ tablet, TAKE 2 TABLETS(20 MEQ) BY MOUTH DAILY, Disp: 180 tablet, Rfl: 3 .  torsemide (DEMADEX) 20 MG tablet, Take 3 tablets (60 mg total) by mouth as needed., Disp: 90 tablet, Rfl: 1 Allergies  Allergen Reactions  . Benazepril Hcl Other (See Comments)  Unknown  . Lotrel [Amlodipine Besy-Benazepril Hcl] Other (See Comments)    Hypotension   . Talwin [Pentazocine] Other (See Comments)    Hallucinations     Social History   Tobacco Use  Smoking Status Former Smoker  . Quit date: 03/14/2009  . Years since quitting: 11.4  Smokeless Tobacco Never Used  Tobacco Comment   quit smoking 2010   Objective:  There were no vitals filed for this visit. Constitutional Patient is a pleasant 80 y.o. African American female WD, WN in NAD.[name] AAO x 3.  Vascular Capillary fill time to digits <3 seconds b/l lower extremities. Faintly palpable DP pulse(s) b/l lower extremities. Nonpalpable PT pulse(s) b/l lower extremities. Pedal hair sparse. Lower extremity skin temperature gradient within normal limits. No cyanosis or clubbing noted.  Neurologic Normal speech. Oriented to person, place, and time. Protective sensation intact 5/5 intact bilaterally with  10g monofilament b/l. Vibratory sensation intact b/l.  Dermatologic Pedal skin with normal turgor, texture and tone bilaterally. No open wounds bilaterally. No interdigital macerations bilaterally. Toenails 1-5 b/l elongated, discolored, dystrophic, thickened, crumbly with subungual debris and tenderness to dorsal palpation. Hyperkeratotic lesion(s) submet head 5 left foot and submet head 5 right foot.  No erythema, no edema, no drainage, no fluctuance.  Orthopedic: Normal muscle strength 5/5 to all lower extremity muscle groups bilaterally. No pain crepitus or joint limitation noted with ROM b/l. Hammertoes noted to the 2-5 bilaterally.   Hemoglobin A1C Latest Ref Rng & Units 02/15/2020 10/04/2019  HGBA1C 4.8 - 5.6 % 6.5(H) 6.0(H)  Some recent data might be hidden   Assessment:   1. Pain due to onychomycosis of toenails of both feet   2. Callus   3. Type 2 diabetes, controlled, with peripheral neuropathy ([place])    Plan:  Patient was evaluated and treated and all questions answered.  Onychomycosis with pain -Nails palliatively debridement as below. -Educated on self-care  Procedure: Nail Debridement Rationale: Pain Type of Debridement: manual, sharp debridement. Instrumentation: Nail nipper, rotary burr. Number of Nails: 10  -Examined patient. -Continue diabetic foot care principles. -Toenails 1-5 b/l were debrided in length and girth with sterile nail nippers and dremel without iatrogenic bleeding. -Callus(es) submet head 1 left foot, submet head 1 right foot, submet head 5 left foot and submet head 5 right foot pared utilizing sterile scalpel blade without complication or incident. Total number debrided =4. -Patient to report any pedal injuries to medical professional immediately. -Patient to continue soft, supportive shoe gear daily. -Patient/POA to call should there be question/concern in the interim.  Return in about 3 months (around 11/14/2020).  Marzetta Board, DPM

## 2020-08-24 ENCOUNTER — Other Ambulatory Visit (HOSPITAL_COMMUNITY)
Admission: RE | Admit: 2020-08-24 | Discharge: 2020-08-24 | Disposition: A | Discharge status: Home / Self Care | Payer: Medicare PPO | Source: Ambulatory Visit | Attending: Internal Medicine | Admitting: Internal Medicine

## 2020-08-24 DIAGNOSIS — Z20822 Contact with and (suspected) exposure to covid-19: Secondary | ICD-10-CM | POA: Insufficient documentation

## 2020-08-24 DIAGNOSIS — Z01812 Encounter for preprocedural laboratory examination: Secondary | ICD-10-CM | POA: Diagnosis not present

## 2020-08-24 LAB — SARS CORONAVIRUS 2 (TAT 6-24 HRS): SARS Coronavirus 2: NEGATIVE

## 2020-08-26 ENCOUNTER — Ambulatory Visit (HOSPITAL_COMMUNITY)
Admission: RE | Admit: 2020-08-26 | Discharge: 2020-08-26 | Disposition: A | Discharge status: Home / Self Care | Payer: Medicare PPO | Source: Ambulatory Visit | Attending: Internal Medicine | Admitting: Internal Medicine

## 2020-08-26 ENCOUNTER — Other Ambulatory Visit: Payer: Self-pay

## 2020-08-26 DIAGNOSIS — I5042 Chronic combined systolic (congestive) and diastolic (congestive) heart failure: Secondary | ICD-10-CM | POA: Insufficient documentation

## 2020-08-26 LAB — PULMONARY FUNCTION TEST
DL/VA % pred: 92 %
DL/VA: 3.83 ml/min/mmHg/L
DLCO unc % pred: 60 %
DLCO unc: 10.62 ml/min/mmHg
FEF 25-75 Post: 1.34 L/sec
FEF 25-75 Pre: 1.19 L/sec
FEF2575-%Change-Post: 12 %
FEF2575-%Pred-Post: 122 %
FEF2575-%Pred-Pre: 108 %
FEV1-%Change-Post: 3 %
FEV1-%Pred-Post: 95 %
FEV1-%Pred-Pre: 91 %
FEV1-Post: 1.27 L
FEV1-Pre: 1.22 L
FEV1FVC-%Change-Post: 0 %
FEV1FVC-%Pred-Pre: 106 %
FEV6-%Change-Post: 4 %
FEV6-%Pred-Post: 96 %
FEV6-%Pred-Pre: 92 %
FEV6-Post: 1.59 L
FEV6-Pre: 1.52 L
FEV6FVC-%Pred-Post: 105 %
FEV6FVC-%Pred-Pre: 105 %
FVC-%Change-Post: 4 %
FVC-%Pred-Post: 91 %
FVC-%Pred-Pre: 87 %
FVC-Post: 1.59 L
FVC-Pre: 1.52 L
Post FEV1/FVC ratio: 80 %
Post FEV6/FVC ratio: 100 %
Pre FEV1/FVC ratio: 80 %
Pre FEV6/FVC Ratio: 100 %
RV % pred: 81 %
RV: 1.9 L
TLC % pred: 73 %
TLC: 3.48 L

## 2020-08-26 MED ORDER — ALBUTEROL SULFATE (2.5 MG/3ML) 0.083% IN NEBU
2.5000 mg | INHALATION_SOLUTION | Freq: Once | RESPIRATORY_TRACT | Status: AC
Start: 2020-08-26 — End: 2020-08-26
  Administered 2020-08-26: 2.5 mg via RESPIRATORY_TRACT

## 2020-08-27 ENCOUNTER — Other Ambulatory Visit: Payer: Self-pay

## 2020-08-27 NOTE — Patient Instructions (Signed)
Goals Addressed            This Visit's Progress   . THN-Track and Manage Fluids and Swelling   On track    Timeframe:  Long-Range Goal Priority:  High Start Date:   07/24/20                          Expected End Date:  03/19/21                   Follow Up Date 11/16/20   - do ankle pumps when sitting - keep legs up while sitting - use salt in moderation - weigh myself daily    Why is this important?   It is important to check your weight daily and watch how much salt and liquids you have.  It will help you to manage your heart failure.    Notes: Patient reports that if her weight is over 149 to 151 lbs she is to take a fluid pill.  Weight this AM 150 lbs. Encouraged to continue to monitor weights and limit salt intake and salty food items.      . THN-Track and Manage Symptoms   On track    Timeframe:  Long-Range Goal Priority:  High Start Date:  07/24/20                           Expected End Date: 03/19/21                     Follow Up Date 12/17/20   - eat more whole grains, fruits and vegetables, lean meats and healthy fats - follow rescue plan if symptoms flare-up - know when to call the doctor    Why is this important?   You will be able to handle your symptoms better if you keep track of them.  Making some simple changes to your lifestyle will help.  Eating healthy is one thing you can do to take good care of yourself.    Notes:

## 2020-08-27 NOTE — Patient Outreach (Signed)
[facility]) Care Management  [place]  08/27/2020   [name] [birth year] [id number]  Subjective: Telephone call to patient for follow up.  She reports doing well. Weight today 151.2 lbs.  Patient took extra lasix today and potassium.  Discussed importance of salt reduction.  She verbalized understanding and voices no concerns.    Objective:   Encounter Medications:  Outpatient Encounter Medications as of 08/27/2020  Medication Sig  . ACCU-CHEK AVIVA PLUS test strip USE  TO TEST BLOOD SUGAR ONE TIME DAILY  . Accu-Chek Softclix Lancets lancets USE  AS  INSTRUCTED (Patient taking differently: 1 each by Other route as needed.)  . Alcohol Swabs (B-D SINGLE USE SWABS REGULAR) PADS USE  TO TEST BLOOD SUGAR ONE TIME DAILY (Patient taking differently: 1 each by Other route as needed (Clean skin before blood sugar reading).)  . amLODipine (NORVASC) 5 MG tablet Take 1 tablet (5 mg total) by mouth at bedtime.  [name] aspirin 81 MG tablet Take 81 mg by mouth at bedtime.   [name] atorvastatin (LIPITOR) 40 MG tablet TAKE 1 TABLET EVERY DAY  . budesonide (PULMICORT) 0.5 MG/2ML nebulizer solution Take 2 mLs (0.5 mg total) by nebulization daily.  . carvedilol (COREG) 3.125 MG tablet TAKE 1 TABLET TWICE DAILY WITH MEALS  . cholecalciferol (VITAMIN D3) 25 MCG (1000 UNIT) tablet Take 1,000 Units by mouth as directed. M-W-F  . ferrous sulfate 325 (65 FE) MG tablet Take 325 mg by mouth as directed. M-W-F  . ipratropium-albuterol (DUONEB) 0.5-2.5 (3) MG/3ML SOLN Take 3 mLs by nebulization every 6 (six) hours as needed (wheezing, SOB).  [name] LINZESS 145 MCG CAPS capsule TAKE 1 CAPSULE EVERY DAY BEFORE BREAKFAST  . mirabegron ER (MYRBETRIQ) 50 MG TB24 tablet Take 50 mg by mouth as needed. Overactive bladder  . NONFORMULARY OR COMPOUNDED ITEM Antifungal solution: Terbinafine 3%, Fluconazole 2%, Tea Tree Oil 5%, Urea 10%, Ibuprofen 2% in DMSO suspension #78m  . OXYGEN Inhale 2 L into the lungs  continuous.   . potassium chloride (KLOR-CON) 10 MEQ tablet TAKE 2 TABLETS(20 MEQ) BY MOUTH DAILY  . torsemide (DEMADEX) 20 MG tablet Take 3 tablets (60 mg total) by mouth as needed.   No facility-administered encounter medications on file as of 08/27/2020.    Functional Status:  In your present state of health, do you have any difficulty performing the following activities: 04/10/2020 04/08/2020  Hearing? N N  Vision? N N  Difficulty concentrating or making decisions? N N  Walking or climbing stairs? Y Y  Comment - needs assist  Dressing or bathing? N Y  Comment - family assist  Doing errands, shopping? YTempie Donning Comment - family assists  Preparing Food and eating ? N N  Using the Toilet? Y N  In the past six months, have you accidently leaked urine? Y Y  Comment - incontinence  Do you have problems with loss of bowel control? Y N  Managing your Medications? N Y  Comment - family manages  Managing your Finances? N Y  Comment - family manages  Housekeeping or managing your Housekeeping? N Y  Comment - family helps  Some recent data might be hidden    Fall/Depression Screening: Fall Risk  08/27/2020 04/10/2020 04/08/2020  Falls in the past year? 0 0 0  Number falls in past yr: - 0 -  Injury with Fall? - 0 -   PHQ 2/9 Scores 04/10/2020 04/08/2020 03/11/2020 10/04/2019 04/04/2019 01/27/2018 09/01/2017  PHQ - 2  Score 0 0 0 0 0 0 0    Assessment: Patient managing chronic conditions.   Goals Addressed            This Visit's Progress   . THN-Track and Manage Fluids and Swelling   On track    Timeframe:  Long-Range Goal Priority:  High Start Date:   07/24/20                          Expected End Date:  03/19/21                   Follow Up Date 11/16/20   - do ankle pumps when sitting - keep legs up while sitting - use salt in moderation - weigh myself daily    Why is this important?   It is important to check your weight daily and watch how much salt and liquids you have.  It will  help you to manage your heart failure.    Notes: Patient reports that if her weight is over 149 to 151 lbs she is to take a fluid pill.  Weight this AM 150 lbs. Encouraged to continue to monitor weights and limit salt intake and salty food items.      . THN-Track and Manage Symptoms   On track    Timeframe:  Long-Range Goal Priority:  High Start Date:  07/24/20                           Expected End Date: 03/19/21                     Follow Up Date 12/17/20   - eat more whole grains, fruits and vegetables, lean meats and healthy fats - follow rescue plan if symptoms flare-up - know when to call the doctor    Why is this important?   You will be able to handle your symptoms better if you keep track of them.  Making some simple changes to your lifestyle will help.  Eating healthy is one thing you can do to take good care of yourself.    Notes:        Plan: RN CM will contact patient in April.   Follow-up:  Patient agrees to Care Plan and Follow-up.   [name], RN, MSN [facility] Care Management Coordinator Direct Line [phone] Cell [phone] Toll Free: [phone]  Fax: [phone]

## 2020-09-01 ENCOUNTER — Other Ambulatory Visit: Payer: Self-pay | Admitting: Pulmonary Disease

## 2020-09-04 ENCOUNTER — Ambulatory Visit: Payer: Self-pay

## 2020-09-04 ENCOUNTER — Ambulatory Visit (INDEPENDENT_AMBULATORY_CARE_PROVIDER_SITE_OTHER): Payer: Medicare PPO

## 2020-09-04 DIAGNOSIS — I428 Other cardiomyopathies: Secondary | ICD-10-CM | POA: Diagnosis not present

## 2020-09-04 LAB — CUP PACEART REMOTE DEVICE CHECK
Battery Remaining Longevity: 27 mo
Battery Voltage: 2.94 V
Brady Statistic AP VP Percent: 0.07 %
Brady Statistic AP VS Percent: 71.22 %
Brady Statistic AS VP Percent: 0.02 %
Brady Statistic AS VS Percent: 28.68 %
Brady Statistic RA Percent Paced: 69.46 %
Brady Statistic RV Percent Paced: 0.1 %
Date Time Interrogation Session: 20220216044224
HighPow Impedance: 37 Ohm
HighPow Impedance: 50 Ohm
Implantable Pulse Generator Implant Date: 20131120
Lead Channel Impedance Value: 266 Ohm
Lead Channel Impedance Value: 342 Ohm
Lead Channel Impedance Value: 380 Ohm
Lead Channel Pacing Threshold Amplitude: 0.75 V
Lead Channel Pacing Threshold Amplitude: 0.75 V
Lead Channel Pacing Threshold Pulse Width: 0.4 ms
Lead Channel Pacing Threshold Pulse Width: 0.4 ms
Lead Channel Sensing Intrinsic Amplitude: 0.5 mV
Lead Channel Sensing Intrinsic Amplitude: 0.5 mV
Lead Channel Sensing Intrinsic Amplitude: 3.375 mV
Lead Channel Sensing Intrinsic Amplitude: 3.375 mV
Lead Channel Setting Pacing Amplitude: 1.5 V
Lead Channel Setting Pacing Amplitude: 2 V
Lead Channel Setting Pacing Pulse Width: 0.4 ms
Lead Channel Setting Sensing Sensitivity: 0.3 mV

## 2020-09-07 DIAGNOSIS — I10 Essential (primary) hypertension: Secondary | ICD-10-CM | POA: Diagnosis not present

## 2020-09-07 DIAGNOSIS — J449 Chronic obstructive pulmonary disease, unspecified: Secondary | ICD-10-CM | POA: Diagnosis not present

## 2020-09-07 DIAGNOSIS — I5089 Other heart failure: Secondary | ICD-10-CM | POA: Diagnosis not present

## 2020-09-07 DIAGNOSIS — R531 Weakness: Secondary | ICD-10-CM | POA: Diagnosis not present

## 2020-09-07 DIAGNOSIS — G4733 Obstructive sleep apnea (adult) (pediatric): Secondary | ICD-10-CM | POA: Diagnosis not present

## 2020-09-08 NOTE — Progress Notes (Signed)
PCP: Harland Dingwall NP-C  HF Cardiologist: Dr Haroldine Laws Cardiology: Dr Loleta Chance Nephrology: Dr Resa Miner   HPI:  Ms. Cosma is an 80 y/o w/longstanding chronic systolic heart failure/ NICM and pulmonary hypertension.   Per records, her systolic HF dates back to 2006. Mild nonobstructive CAD by LHC in 2006 and again in 2015. She is s/p Metronic ICD, also w/ restrictive lung disease, OSA on CPAP, essential hypertension, CKD, HLD and T2DM. Her daughter is a Therapist, sports for [employer].   Echo 2016 with LVEF 45% w/ mild AS, normal RV but severe pulmonary HTN, PASP 70. She has been followed by Dr. Halford Chessman for restrictive lung disease. Previous PFTs(2018)showed no obstruction but fairly significant restriction (65% of predicted)and markedly reduced DLCO(39%, corrected for alveolar volume).  Admitted 7/21 for acute on chronic hypoxic respiratory failure and acute on chronic systolic heart failure w/ volume overload. Hypoxic in the 80s on arrival and required NRB. LEE up to thighs. BNP 617. CXR w/ cardiomegaly, but no overt edema and no infiltrate or effusions. D-dimer elevated at 1.07 but V/Q scan negative for PE. Echo LVEF 45-50% w/ global hypokinesis. RV severely enlarged w/ moderately elevated pulmonary artery systolic pressure, 14.4 mmHg, + mod-severe TR. RHC showed severe PAH w/ normal left-sided pressures. CO/CI normal.  Admitted 8/18 with A/C Systolic HF. Diuresed with IV lasix and transitioned to torsemide 60 mg daily. Creatinine peaked at 3.42. Renal US was negative. Sildenafil and amlodipine both stopped due to hypotension. Discharged 03/07/2020.   On 04/08/20 Weight at home went down to 140. Dr Orene Desanctis  instructed to change torsemide to 20 mg as needed for weight 150 -152  Pounds.   Today she returns for HF follow up with her daughter. At last visit we discussed rechecking PFTs and if ok/improved would consider possible repeat RHC with eye toward another trial of selective pulmonary vasodilators.  PFT repeated and improved (see below)  Overall feeling well. Taking torsemide 20 mg if weight gets above 151 lbs; has not taken any doses this past week, weight stable at 151 lbs at home. Intermittently wears oxygen during the day, if SpO2 drops she will put oxygen on. Uses rolling walker. Wears CPAP every night. Does have some shortness of breath with exertion. Denies PND/Orthopnea, does sleep on 2 pillows at night. No CP or dizziness. Lives alone. Has lots of support from her family.   She is staying active spending time with her great grandson taking him outside occasionally.  Walks around the building without issue.  Walked from the parking lot to this office today without having to stop and rest.  Pushes a cart at the grocery store.  She denies chest pain, orthopnea or PND.  She is weighing herself daily, taking torsemide PRN has taken it about 8 times in the last 20 days when her weight is above 151.    PFTs 2/22 FEV1 1.22 (91%) FVC 1.52 (87%) FEF 25-75% 1.19 (108%) DLCO 60%  PFTs 10/18: FEV1 1.06 (69%) FVC 1.48 (65%) FEF 25-75% 0.74 (40%) DLCO 7.8 39%  RHC Findings 02/15/20: RA = 20 RV = 72/27 PA = 79/44 (53) PCW = 18 Fick cardiac output/index = 5.35/2.9 Thermo CO/CI = 4.4/2.4 PVR = 6.5 (Fick) 8.0 (Thermo) Ao sat = 94% PA sat = 64%, 64% SVC sat = 61%  Assessment: 1. Severe PAH with normal left-sided pressures  Plan/Discussion: There is near equalization or diastolic pressures which can reflect restrictive physiology or elevated R-sided pressure with normal left pressures. Given relatively normal PCWP -  restriction felt to be less likely.   ROS: All systems negative except as listed in HPI, PMH and Problem List.  SH:  Social History   Socioeconomic History  . Marital status: Widowed    Spouse name: Not on file  . Number of children: Not on file  . Years of education: Not on file  . Highest education level: Not on file  Occupational History  . Not on file   Tobacco Use  . Smoking status: Former Smoker    Quit date: 03/14/2009    Years since quitting: 11.4  . Smokeless tobacco: Never Used  . Tobacco comment: quit smoking 2010  Vaping Use  . Vaping Use: Never used  Substance and Sexual Activity  . Alcohol use: No  . Drug use: No  . Sexual activity: Not on file  Other Topics Concern  . Not on file  Social History Narrative   Lives alone.  2 daughters and 1 granddaughter in [place]   Social Determinants of Health   Financial Resource Strain: Not on file  Food Insecurity: No Food Insecurity  . Worried About Charity fundraiser in the Last Year: Never true  . Ran Out of Food in the Last Year: Never true  Transportation Needs: No Transportation Needs  . Lack of Transportation (Medical): No  . Lack of Transportation (Non-Medical): No  Physical Activity: Not on file  Stress: No Stress Concern Present  . Feeling of Stress : Not at all  Social Connections: Moderately Integrated  . Frequency of Communication with Friends and Family: More than three times a week  . Frequency of Social Gatherings with Friends and Family: More than three times a week  . Attends Religious Services: More than 4 times per year  . Active Member of Clubs or Organizations: Yes  . Attends Archivist Meetings: 1 to 4 times per year  . Marital Status: Widowed  Intimate Partner Violence: Not on file    FH:  Family History  Problem Relation Age of Onset  . Colon polyps Daughter   . Hypertension Daughter   . Hypertension Mother   . Stroke Mother   . Hypertension Father   . Diabetes Brother   . Hypertension Brother   . Hypertension Daughter   . Colon cancer Neg Hx   . Esophageal cancer Neg Hx   . Rectal cancer Neg Hx   . Stomach cancer Neg Hx     Past Medical History:  Diagnosis Date  . Anemia   . CAD (coronary artery disease)   . Cardiac defibrillator in situ   . Cataract    bilateral 2017  . CHF (congestive heart failure) ([place])   . Chronic  systolic heart failure ([place])   . CKD (chronic kidney disease) stage 4, GFR 15-29 ml/min (HCC)   . COPD (chronic obstructive pulmonary disease) ([place])   . Dyspnea   . HTN (hypertension)   . 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 Rollene Rotunda in [place]. Per notes she was due for colonoscopy in 12/2016  . OSA on CPAP 09/08/2017  . Other cardiomyopathies ([place])   . Oxygen deficiency    2 liters at night  . Pulmonary HTN ([place])   . Sleep apnea    uses c-pap  . Stroke ([place])   . Type 2 diabetes, controlled, with peripheral neuropathy ([place])   . Urge incontinence of urine     Current Outpatient Medications  Medication Sig Dispense Refill  . Alcohol Swabs (B-D  SINGLE USE SWABS REGULAR) PADS USE  TO TEST BLOOD SUGAR ONE TIME DAILY 100 each 1  . Alcohol Swabs (B-D SINGLE USE SWABS REGULAR) PADS USE  TO TEST BLOOD SUGAR ONE TIME DAILY 100 each 1  . amLODipine (NORVASC) 5 MG tablet Take 1 tablet (5 mg total) by mouth at bedtime. 90 tablet 3  . aspirin 81 MG tablet Take 81 mg by mouth at bedtime.     [name] atorvastatin (LIPITOR) 40 MG tablet TAKE 1 TABLET EVERY DAY 90 tablet 0  . Blood Glucose Calibration (TRUE METRIX LEVEL 1) Low SOLN Use as directed 1 each 0  . Blood Glucose Monitoring Suppl (TRUE METRIX METER) DEVI Test daily  Pt will need a true metrix meter E11.9 1 each 0  . budesonide (PULMICORT) 0.5 MG/2ML nebulizer solution Take 2 mLs (0.5 mg total) by nebulization daily. 2 mL 5  . carvedilol (COREG) 3.125 MG tablet TAKE 1 TABLET TWICE DAILY WITH MEALS 180 tablet 0  . cholecalciferol (VITAMIN D3) 25 MCG (1000 UNIT) tablet Take 1,000 Units by mouth as directed. M-W-F    . ferrous sulfate 325 (65 FE) MG tablet Take 325 mg by mouth as directed. M-W-F    . Glucose Blood (BLOOD GLUCOSE TEST STRIPS) STRP Test 1 time a day E11.9 100 strip 0  . ipratropium-albuterol (DUONEB) 0.5-2.5 (3) MG/3ML SOLN INHALE THE CONTENTS OF 1 VIAL VIA NEBULIZER EVERY 6 HOURS AS NEEDED FOR WHEEZING, SHORTNESS OF  BREATH 360 mL 3  . LINZESS 145 MCG CAPS capsule TAKE 1 CAPSULE EVERY DAY BEFORE BREAKFAST 90 capsule 1  . mirabegron ER (MYRBETRIQ) 50 MG TB24 tablet Take 50 mg by mouth as needed. Overactive bladder    . NONFORMULARY OR COMPOUNDED ITEM Antifungal solution: Terbinafine 3%, Fluconazole 2%, Tea Tree Oil 5%, Urea 10%, Ibuprofen 2% in DMSO suspension #25m 1 each 3  . OXYGEN Inhale 2 L into the lungs continuous.     . potassium chloride (KLOR-CON) 10 MEQ tablet TAKE 2 TABLETS(20 MEQ) BY MOUTH DAILY 180 tablet 3  . torsemide (DEMADEX) 20 MG tablet Take 3 tablets (60 mg total) by mouth as needed. 90 tablet 1  . TRUEplus Lancets 33G MISC Test once a day 100 each 1   No current facility-administered medications for this encounter.    Vitals:   09/09/20 1400  BP: 138/70  Pulse: 62  SpO2: 100%  Weight: 71 kg (156 lb 9.6 oz)   Wt Readings from Last 3 Encounters:  09/09/20 71 kg (156 lb 9.6 oz)  08/16/20 69.9 kg (154 lb 3.2 oz)  07/30/20 71.2 kg (157 lb)   Device Interrogation: no AT/AF, patient activity ~0.1 hr/day    PHYSICAL EXAM: General: Well appearing. No resp difficulty HEENT: normal Neck: supple. JVP 5-6. Carotids 2+ bilat; no bruits. No lymphadenopathy or thryomegaly appreciated. Cor: PMI nondisplaced. Regular rate & rhythm. No rubs, gallops or murmurs. Lungs: clear, RLL fine crackles Abdomen: soft, nontender, nondistended. No hepatosplenomegaly. No bruits or masses. Good bowel sounds. Extremities: no cyanosis, clubbing, rash, edema Neuro: alert & orientedx3, cranial nerves grossly intact. moves all 4 extremities w/o difficulty. Affect pleasant  ASSESSMENT & PLAN:  1. PAH - RHC 01/2020 demonstrated severe PAH w/ normal left-sided pressures. PA = 79/44 (53). CO/CI normal. - Previously suspected predominently WHO Group 3 in the setting of restrictive lung disease and OSA. However PFTs have now normalized.  - Continue CPAP qhs - Sildenafil stopped during the August hospitalization  due to hypotension.  - Feeling better  NYHA II-III - Repeat PFTs are much improved. Will repeat echo and RHC to re-evaluate and reassess need for careful trial of pulmonary vasodilators  2. Chronic Systolic Heart Failure w/ Predominant RV Failure - long standing history of chronic systolic heart failure/ NICM and pulmonary hypertension. Mild nonobstructive CAD by LHC in 2006 and again in 2015.  - [employer] ICD Device Interrogation: no AT/AF, patient activity ~0.1 hr/day  - Echo 01/2020 EF 45-50%. RV severely enlarged w/ moderately reduced systolic function 2/2 PH - NYHA III volume status ok  - Continue Coreg 3.125 bid - Continue torsemide prn - Consider SGLT2i in near future depending on results of echo and RHC  3. Chronic hypoxic/hypercapnic respiratory failure - Multifactorial, 2/2 PAH, a/c CHF and restrictive lung disease  - Supp O2, SpO2 goal >88% - Continue pulmicort, duoneb - CPAP qhs - Needs to wear oxygen 2 liters continuously.  - PFTs 2/22 now normalized. Repeat echo and RHC as above   4. CKD Stage IIIb.  - Cr baseline ~1.5-1.9 - Followed by Dr. Carolynne Edouard  - Consider SGLT2i   5. HTN  - Elevated today. Allowing to run high per Nephrology.  - Norvasc 75m PRN is SBP>140  6. T2DM - Per PCP   7. OSA - Continue CPAP qhs   DGlori Bickers MD  3:07 PM

## 2020-09-09 ENCOUNTER — Ambulatory Visit (HOSPITAL_COMMUNITY)
Admission: RE | Admit: 2020-09-09 | Discharge: 2020-09-09 | Disposition: A | Discharge status: Home / Self Care | Payer: Medicare PPO | Source: Ambulatory Visit | Attending: Internal Medicine | Admitting: Internal Medicine

## 2020-09-09 ENCOUNTER — Other Ambulatory Visit: Payer: Self-pay | Admitting: Internal Medicine

## 2020-09-09 ENCOUNTER — Encounter (HOSPITAL_COMMUNITY): Payer: Self-pay | Admitting: Internal Medicine

## 2020-09-09 ENCOUNTER — Telehealth: Payer: Self-pay | Admitting: Gastroenterology

## 2020-09-09 ENCOUNTER — Other Ambulatory Visit: Payer: Self-pay

## 2020-09-09 ENCOUNTER — Other Ambulatory Visit: Payer: Self-pay | Admitting: Family Medicine

## 2020-09-09 VITALS — BP 138/70 | HR 62 | Wt 156.6 lb

## 2020-09-09 DIAGNOSIS — J449 Chronic obstructive pulmonary disease, unspecified: Secondary | ICD-10-CM | POA: Insufficient documentation

## 2020-09-09 DIAGNOSIS — Z79899 Other long term (current) drug therapy: Secondary | ICD-10-CM | POA: Insufficient documentation

## 2020-09-09 DIAGNOSIS — Z8673 Personal history of transient ischemic attack (TIA), and cerebral infarction without residual deficits: Secondary | ICD-10-CM | POA: Insufficient documentation

## 2020-09-09 DIAGNOSIS — I2510 Atherosclerotic heart disease of native coronary artery without angina pectoris: Secondary | ICD-10-CM | POA: Insufficient documentation

## 2020-09-09 DIAGNOSIS — G4733 Obstructive sleep apnea (adult) (pediatric): Secondary | ICD-10-CM

## 2020-09-09 DIAGNOSIS — I272 Pulmonary hypertension, unspecified: Secondary | ICD-10-CM | POA: Insufficient documentation

## 2020-09-09 DIAGNOSIS — Z9989 Dependence on other enabling machines and devices: Secondary | ICD-10-CM

## 2020-09-09 DIAGNOSIS — E1142 Type 2 diabetes mellitus with diabetic polyneuropathy: Secondary | ICD-10-CM | POA: Diagnosis not present

## 2020-09-09 DIAGNOSIS — I5042 Chronic combined systolic (congestive) and diastolic (congestive) heart failure: Secondary | ICD-10-CM

## 2020-09-09 DIAGNOSIS — Z87891 Personal history of nicotine dependence: Secondary | ICD-10-CM | POA: Insufficient documentation

## 2020-09-09 DIAGNOSIS — E785 Hyperlipidemia, unspecified: Secondary | ICD-10-CM | POA: Insufficient documentation

## 2020-09-09 DIAGNOSIS — Z95810 Presence of automatic (implantable) cardiac defibrillator: Secondary | ICD-10-CM | POA: Insufficient documentation

## 2020-09-09 DIAGNOSIS — I2721 Secondary pulmonary arterial hypertension: Secondary | ICD-10-CM

## 2020-09-09 DIAGNOSIS — Z8249 Family history of ischemic heart disease and other diseases of the circulatory system: Secondary | ICD-10-CM | POA: Insufficient documentation

## 2020-09-09 DIAGNOSIS — I130 Hypertensive heart and chronic kidney disease with heart failure and stage 1 through stage 4 chronic kidney disease, or unspecified chronic kidney disease: Secondary | ICD-10-CM | POA: Insufficient documentation

## 2020-09-09 DIAGNOSIS — J9611 Chronic respiratory failure with hypoxia: Secondary | ICD-10-CM | POA: Insufficient documentation

## 2020-09-09 DIAGNOSIS — Z7951 Long term (current) use of inhaled steroids: Secondary | ICD-10-CM | POA: Insufficient documentation

## 2020-09-09 DIAGNOSIS — E1122 Type 2 diabetes mellitus with diabetic chronic kidney disease: Secondary | ICD-10-CM | POA: Diagnosis not present

## 2020-09-09 DIAGNOSIS — N184 Chronic kidney disease, stage 4 (severe): Secondary | ICD-10-CM

## 2020-09-09 DIAGNOSIS — Z7982 Long term (current) use of aspirin: Secondary | ICD-10-CM | POA: Insufficient documentation

## 2020-09-09 DIAGNOSIS — J9612 Chronic respiratory failure with hypercapnia: Secondary | ICD-10-CM | POA: Insufficient documentation

## 2020-09-09 DIAGNOSIS — I428 Other cardiomyopathies: Secondary | ICD-10-CM | POA: Diagnosis not present

## 2020-09-09 MED ORDER — BD SWAB SINGLE USE REGULAR PADS: MEDICATED_PAD | 1 refills | Status: DC

## 2020-09-09 MED ORDER — TRUE METRIX LEVEL 1 LOW VI SOLN: 0 refills | Status: DC

## 2020-09-09 MED ORDER — BLOOD GLUCOSE TEST VI STRP: ORAL_STRIP | 0 refills | Status: DC

## 2020-09-09 MED ORDER — TRUE METRIX METER DEVI: 0 refills | Status: DC

## 2020-09-09 MED ORDER — TRUEPLUS LANCETS 33G MISC: 1 refills | Status: DC

## 2020-09-09 NOTE — Telephone Encounter (Signed)
Spoke with patient, she reports that her last BM was yesterday but she feels like she is not emptying completely. Patient has been advised on how to complete a Miralax purge. Patient verbalized understanding and had no concerns at the end of the call.

## 2020-09-09 NOTE — Addendum Note (Signed)
Encounter addended by: Jolaine Artist, MD on: 09/09/2020 3:08 PM  Actions taken: Level of Service modified, Visit diagnoses modified

## 2020-09-09 NOTE — Patient Instructions (Signed)
Please call our office to schedule the following appointments:  Right Heart Cath soon  Echocardiogram in 1-2 months  Follow up with Dr. Haroldine Laws in 4 months  If you have any questions or concerns before your next appointment please send [place] a message through [place] or call our office at [phone].    TO LEAVE A MESSAGE FOR THE NURSE SELECT OPTION 2, PLEASE LEAVE A MESSAGE INCLUDING: . YOUR NAME . DATE OF BIRTH . CALL BACK NUMBER . REASON FOR CALL**this is important as we prioritize the call backs  YOU WILL RECEIVE A CALL BACK THE SAME DAY AS LONG AS YOU CALL BEFORE 4:00 PM

## 2020-09-10 NOTE — Assessment & Plan Note (Signed)
-  Patient is 100% compliant with CPAP and reports benefit from use - Pressure 4-20cm h20 (19.1cm h20-95%); residual AHI 2.2  - Continue to wear CPAP every night for minimum of 6 hours or more - No changes today - FU in 6 months

## 2020-09-10 NOTE — Assessment & Plan Note (Addendum)
-  Appears euvolemic on exam - Continue torsemide 60 mg as needed  - Scheduled for PFTs with Dr. Haroldine Laws

## 2020-09-10 NOTE — Assessment & Plan Note (Signed)
-  Stable interval; well controlled on present treatment. Rare SABA use - Continue Budesonide twice daily; ipratropium-albuterol nebulizer every 6 hours for breakthrough shortness of breath or wheezing

## 2020-09-10 NOTE — Assessment & Plan Note (Signed)
-  Secondary to [place], heart failure and COPD - Continue 2L supplemental oxygen and CPAP

## 2020-09-11 NOTE — Progress Notes (Signed)
Remote ICD transmission.   

## 2020-09-16 ENCOUNTER — Encounter (HOSPITAL_COMMUNITY): Payer: Self-pay

## 2020-09-16 ENCOUNTER — Other Ambulatory Visit (HOSPITAL_COMMUNITY): Payer: Self-pay

## 2020-09-16 DIAGNOSIS — I5089 Other heart failure: Secondary | ICD-10-CM | POA: Diagnosis not present

## 2020-09-16 DIAGNOSIS — I10 Essential (primary) hypertension: Secondary | ICD-10-CM | POA: Diagnosis not present

## 2020-09-16 DIAGNOSIS — G4733 Obstructive sleep apnea (adult) (pediatric): Secondary | ICD-10-CM | POA: Diagnosis not present

## 2020-09-16 DIAGNOSIS — J449 Chronic obstructive pulmonary disease, unspecified: Secondary | ICD-10-CM | POA: Diagnosis not present

## 2020-09-16 DIAGNOSIS — I5042 Chronic combined systolic (congestive) and diastolic (congestive) heart failure: Secondary | ICD-10-CM

## 2020-09-19 DIAGNOSIS — N184 Chronic kidney disease, stage 4 (severe): Secondary | ICD-10-CM | POA: Diagnosis not present

## 2020-09-24 ENCOUNTER — Other Ambulatory Visit: Payer: Self-pay

## 2020-09-24 ENCOUNTER — Telehealth: Payer: Self-pay | Admitting: Family Medicine

## 2020-09-24 MED ORDER — MIRABEGRON ER 50 MG PO TB24: 50.0000 mg | ORAL_TABLET | ORAL | 0 refills | Status: DC | PRN

## 2020-09-24 NOTE — Telephone Encounter (Signed)
[facility] mail  Order called requesting a refill for myrbetriq please send to the [facility], [address] AT [place] Pt just needs a 30 day supply sent to the [facility] is on delay for mail order,

## 2020-09-24 NOTE — Telephone Encounter (Signed)
Please take care of this.

## 2020-09-24 NOTE — Telephone Encounter (Signed)
Done [facility]

## 2020-09-25 ENCOUNTER — Other Ambulatory Visit: Payer: Self-pay

## 2020-09-25 MED ORDER — MIRABEGRON ER 50 MG PO TB24: 50.0000 mg | ORAL_TABLET | ORAL | 0 refills | Status: DC | PRN

## 2020-09-26 DIAGNOSIS — I509 Heart failure, unspecified: Secondary | ICD-10-CM | POA: Diagnosis not present

## 2020-09-26 DIAGNOSIS — J449 Chronic obstructive pulmonary disease, unspecified: Secondary | ICD-10-CM | POA: Diagnosis not present

## 2020-09-26 DIAGNOSIS — I129 Hypertensive chronic kidney disease with stage 1 through stage 4 chronic kidney disease, or unspecified chronic kidney disease: Secondary | ICD-10-CM | POA: Diagnosis not present

## 2020-09-26 DIAGNOSIS — D631 Anemia in chronic kidney disease: Secondary | ICD-10-CM | POA: Diagnosis not present

## 2020-09-26 DIAGNOSIS — N2581 Secondary hyperparathyroidism of renal origin: Secondary | ICD-10-CM | POA: Diagnosis not present

## 2020-09-26 DIAGNOSIS — R609 Edema, unspecified: Secondary | ICD-10-CM | POA: Diagnosis not present

## 2020-09-26 DIAGNOSIS — N1832 Chronic kidney disease, stage 3b: Secondary | ICD-10-CM | POA: Diagnosis not present

## 2020-10-05 DIAGNOSIS — G4733 Obstructive sleep apnea (adult) (pediatric): Secondary | ICD-10-CM | POA: Diagnosis not present

## 2020-10-05 DIAGNOSIS — R531 Weakness: Secondary | ICD-10-CM | POA: Diagnosis not present

## 2020-10-05 DIAGNOSIS — J449 Chronic obstructive pulmonary disease, unspecified: Secondary | ICD-10-CM | POA: Diagnosis not present

## 2020-10-05 DIAGNOSIS — I5089 Other heart failure: Secondary | ICD-10-CM | POA: Diagnosis not present

## 2020-10-05 DIAGNOSIS — I10 Essential (primary) hypertension: Secondary | ICD-10-CM | POA: Diagnosis not present

## 2020-10-10 DIAGNOSIS — J449 Chronic obstructive pulmonary disease, unspecified: Secondary | ICD-10-CM | POA: Diagnosis not present

## 2020-10-10 DIAGNOSIS — G4733 Obstructive sleep apnea (adult) (pediatric): Secondary | ICD-10-CM | POA: Diagnosis not present

## 2020-10-10 DIAGNOSIS — I10 Essential (primary) hypertension: Secondary | ICD-10-CM | POA: Diagnosis not present

## 2020-10-16 ENCOUNTER — Other Ambulatory Visit: Payer: Self-pay

## 2020-10-16 ENCOUNTER — Ambulatory Visit (HOSPITAL_COMMUNITY)
Admission: RE | Admit: 2020-10-16 | Discharge: 2020-10-16 | Disposition: A | Discharge status: Home / Self Care | Payer: Medicare PPO | Source: Ambulatory Visit | Attending: Family Medicine | Admitting: Family Medicine

## 2020-10-16 ENCOUNTER — Encounter: Payer: Self-pay | Admitting: Family Medicine

## 2020-10-16 ENCOUNTER — Ambulatory Visit: Payer: Medicare PPO | Admitting: Family Medicine

## 2020-10-16 ENCOUNTER — Ambulatory Visit (HOSPITAL_COMMUNITY)
Admission: RE | Admit: 2020-10-16 | Discharge: 2020-10-16 | Disposition: A | Discharge status: Home / Self Care | Payer: Medicare PPO | Source: Ambulatory Visit | Attending: Cardiology | Admitting: Cardiology

## 2020-10-16 VITALS — BP 130/82 | HR 64 | Wt 162.4 lb

## 2020-10-16 DIAGNOSIS — G4733 Obstructive sleep apnea (adult) (pediatric): Secondary | ICD-10-CM | POA: Diagnosis not present

## 2020-10-16 DIAGNOSIS — I10 Essential (primary) hypertension: Secondary | ICD-10-CM

## 2020-10-16 DIAGNOSIS — I5042 Chronic combined systolic (congestive) and diastolic (congestive) heart failure: Secondary | ICD-10-CM | POA: Insufficient documentation

## 2020-10-16 DIAGNOSIS — E1122 Type 2 diabetes mellitus with diabetic chronic kidney disease: Secondary | ICD-10-CM | POA: Insufficient documentation

## 2020-10-16 DIAGNOSIS — I130 Hypertensive heart and chronic kidney disease with heart failure and stage 1 through stage 4 chronic kidney disease, or unspecified chronic kidney disease: Secondary | ICD-10-CM | POA: Diagnosis not present

## 2020-10-16 DIAGNOSIS — N3941 Urge incontinence: Secondary | ICD-10-CM | POA: Diagnosis not present

## 2020-10-16 DIAGNOSIS — J9611 Chronic respiratory failure with hypoxia: Secondary | ICD-10-CM

## 2020-10-16 DIAGNOSIS — I5089 Other heart failure: Secondary | ICD-10-CM | POA: Diagnosis not present

## 2020-10-16 DIAGNOSIS — N189 Chronic kidney disease, unspecified: Secondary | ICD-10-CM | POA: Diagnosis not present

## 2020-10-16 DIAGNOSIS — J449 Chronic obstructive pulmonary disease, unspecified: Secondary | ICD-10-CM | POA: Diagnosis not present

## 2020-10-16 DIAGNOSIS — E119 Type 2 diabetes mellitus without complications: Secondary | ICD-10-CM

## 2020-10-16 DIAGNOSIS — G4730 Sleep apnea, unspecified: Secondary | ICD-10-CM | POA: Diagnosis not present

## 2020-10-16 LAB — CBC
HCT: 38.1 % (ref 36.0–46.0)
Hemoglobin: 11.5 g/dL — ABNORMAL LOW (ref 12.0–15.0)
MCH: 28 pg (ref 26.0–34.0)
MCHC: 30.2 g/dL (ref 30.0–36.0)
MCV: 92.7 fL (ref 80.0–100.0)
Platelets: 235 10*3/uL (ref 150–400)
RBC: 4.11 MIL/uL (ref 3.87–5.11)
RDW: 13.9 % (ref 11.5–15.5)
WBC: 4.2 10*3/uL (ref 4.0–10.5)
nRBC: 0 % (ref 0.0–0.2)

## 2020-10-16 LAB — ECHOCARDIOGRAM COMPLETE
Area-P 1/2: 1.97 cm2
S' Lateral: 3.4 cm
Single Plane A4C EF: 55.2 %

## 2020-10-16 LAB — BASIC METABOLIC PANEL
Anion gap: 8 (ref 5–15)
BUN: 45 mg/dL — ABNORMAL HIGH (ref 8–23)
CO2: 32 mmol/L (ref 22–32)
Calcium: 9.6 mg/dL (ref 8.9–10.3)
Chloride: 101 mmol/L (ref 98–111)
Creatinine, Ser: 2.06 mg/dL — ABNORMAL HIGH (ref 0.44–1.00)
GFR, Estimated: 24 mL/min — ABNORMAL LOW (ref >60)
Glucose, Bld: 133 mg/dL — ABNORMAL HIGH (ref 70–99)
Potassium: 3.8 mmol/L (ref 3.5–5.1)
Sodium: 141 mmol/L (ref 135–145)

## 2020-10-16 LAB — POCT GLYCOSYLATED HEMOGLOBIN (HGB A1C): Hemoglobin A1C: 5.6 % (ref 4.0–5.6)

## 2020-10-16 MED ORDER — MIRABEGRON ER 50 MG PO TB24: 50.0000 mg | ORAL_TABLET | ORAL | 1 refills | Status: DC | PRN

## 2020-10-16 NOTE — Progress Notes (Signed)
   Subjective:    Patient ID: [name], female    DOB: Jan 26, 1941, 80 y.o.   MRN: [id number]  HPI Chief Complaint  Patient presents with  . med check    Med check. No concerns   She is here for medication management visit.  Her daughter is with her today. She is under the care of several specialists including cardiology, pulmonology, and nephrology.  I am managing her diabetes which has been well controlled.  She is not on medication and has been doing fine with her blood sugars at home.  Blood pressure has been controlled.  She sees her cardiologist for this.  She has an upcoming catheterization.  She had an echocardiogram today as well as labs.  She is on oxygen and doing fine. No breathing concerns.   Taking Myrbetriq as needed and this seems to be quite helpful.  She reports taking it on days where she leaves the house.  Requests a refill.   Dates Dr. [name] increased her Vitamin D supplement to daily   Denies fever, chills, dizziness, chest pain, palpitations, shortness of breath, GI symptoms.  Denies LE edema.   Review of Systems Pertinent positives and negatives in the history of present illness.     Objective:   Physical Exam BP 130/82   Pulse 64   Wt 162 lb 6.4 oz (73.7 kg)   BMI 29.70 kg/m   Alert and oriented and in no distress. Cardiac exam shows a regular sinus rhythm.  Lungs are clear to auscultation. Oxygen nasal cannula in place. No LE edema. Skin is warm and dry.       Assessment & Plan:  Diabetes mellitus type 2, diet-controlled ([place]) - Plan: HgB A1c  Chronic respiratory failure with hypoxia (HCC)  Essential hypertension  Urge incontinence of urine - Plan: mirabegron ER (MYRBETRIQ) 50 MG TB24 tablet  Hgb A1c 5.6% and her diabetes is well controlled with diet.  Continue eating a healthy, well-balanced and low carbohydrate diet. Myrbetriq refilled and she will continue using this as needed. Continue to follow with cardiology, nephrology and  pulmonology. I will see her back for her Medicare wellness visit or sooner as needed.

## 2020-10-16 NOTE — Progress Notes (Signed)
  Echocardiogram 2D Echocardiogram has been performed.  [name] 10/16/2020, 11:45 AM

## 2020-10-21 ENCOUNTER — Other Ambulatory Visit: Payer: Self-pay | Admitting: Cardiovascular Disease

## 2020-10-29 ENCOUNTER — Other Ambulatory Visit (HOSPITAL_COMMUNITY)
Admission: RE | Admit: 2020-10-29 | Discharge: 2020-10-29 | Disposition: A | Discharge status: Home / Self Care | Payer: Medicare PPO | Source: Ambulatory Visit | Attending: Internal Medicine | Admitting: Internal Medicine

## 2020-10-29 ENCOUNTER — Other Ambulatory Visit: Payer: Self-pay

## 2020-10-29 DIAGNOSIS — Z20822 Contact with and (suspected) exposure to covid-19: Secondary | ICD-10-CM | POA: Insufficient documentation

## 2020-10-29 DIAGNOSIS — Z01812 Encounter for preprocedural laboratory examination: Secondary | ICD-10-CM | POA: Diagnosis not present

## 2020-10-29 LAB — SARS CORONAVIRUS 2 (TAT 6-24 HRS): SARS Coronavirus 2: NEGATIVE

## 2020-10-29 NOTE — Patient Outreach (Signed)
[facility]) Care Management  [place]  10/29/2020   [name] [birth year] [id number]  Subjective: Telephone call to patient for disease management support. Patient reports doing pretty good. Routine right heart cath scheduled for this week. She states her weight has been up as she has been out of town.  Reviewed heart failure and parameters. She is taking her extra lasix as ordered for increased weight. Weight today 151 lbs.  She voices no concerns.    Objective:   Encounter Medications:  Outpatient Encounter Medications as of 10/29/2020  Medication Sig  . Alcohol Swabs (B-D SINGLE USE SWABS REGULAR) PADS USE&nbsp;&nbsp;TO TEST BLOOD SUGAR ONE TIME DAILY  . amLODipine (NORVASC) 5 MG tablet Take 1 tablet (5 mg total) by mouth at bedtime.  [name] aspirin 81 MG tablet Take 81 mg by mouth at bedtime.   [name] atorvastatin (LIPITOR) 40 MG tablet TAKE 1 TABLET EVERY DAY (Patient taking differently: Take 40 mg by mouth daily.)  . Blood Glucose Calibration (TRUE METRIX LEVEL 1) Low SOLN Use as directed  . Blood Glucose Monitoring Suppl (TRUE METRIX METER) DEVI Test daily  Pt will need a true metrix meter E11.9  . budesonide (PULMICORT) 0.5 MG/2ML nebulizer solution Take 2 mLs (0.5 mg total) by nebulization daily.  . carvedilol (COREG) 3.125 MG tablet Take 1 tablet (3.125 mg total) by mouth 2 (two) times daily with a meal.  . cholecalciferol (VITAMIN D3) 25 MCG (1000 UNIT) tablet Take 1,000 Units by mouth daily.  . ferrous sulfate 325 (65 FE) MG tablet Take 325 mg by mouth every Monday, Wednesday, and Friday.  . Glucose Blood (BLOOD GLUCOSE TEST STRIPS) STRP Test 1 time a day E11.9  . ipratropium-albuterol (DUONEB) 0.5-2.5 (3) MG/3ML SOLN INHALE THE CONTENTS OF 1 VIAL VIA NEBULIZER EVERY 6 HOURS AS NEEDED FOR WHEEZING, SHORTNESS OF BREATH (Patient taking differently: Inhale 3 mLs into the lungs every 6 (six) hours as needed (SOB/ Wheezing).)  . LINZESS 145 MCG CAPS capsule TAKE  1 CAPSULE EVERY DAY BEFORE BREAKFAST (Patient taking differently: Take 145 mcg by mouth daily before breakfast.)  . mirabegron ER (MYRBETRIQ) 50 MG TB24 tablet Take 1 tablet (50 mg total) by mouth as needed. Overactive bladder (Patient taking differently: Take 50 mg by mouth daily as needed (Overactive bladder).)  . NONFORMULARY OR COMPOUNDED ITEM Antifungal solution: Terbinafine 3%, Fluconazole 2%, Tea Tree Oil 5%, Urea 10%, Ibuprofen 2% in DMSO suspension #35m (Patient taking differently: Apply 1 application topically daily. Antifungal solution: Terbinafine 3%, Fluconazole 2%, Tea Tree Oil 5%, Urea 10%, Ibuprofen 2% in DMSO suspension #329m  . OXYGEN Inhale 2 L into the lungs at bedtime. Uses during the day as needed  . potassium chloride (KLOR-CON) 10 MEQ tablet TAKE 2 TABLETS(20 MEQ) BY MOUTH DAILY (Patient taking differently: Take 20 mEq by mouth daily as needed (Takes with Torsemide).)  . torsemide (DEMADEX) 20 MG tablet Take 3 tablets (60 mg total) by mouth as needed. (Patient taking differently: Take 60 mg by mouth daily as needed (fluid).)  . TRUEplus Lancets 33G MISC Test once a day   No facility-administered encounter medications on file as of 10/29/2020.    Functional Status:  In your present state of health, do you have any difficulty performing the following activities: 04/10/2020 04/08/2020  Hearing? N N  Vision? N N  Difficulty concentrating or making decisions? N N  Walking or climbing stairs? Y Y  Comment - needs assist  Dressing or bathing? N Aggie Moats  Comment - family assist  Doing errands, shopping? [name]  Comment - family assists  Preparing Food and eating ? N N  Using the Toilet? Y N  In the past six months, have you accidently leaked urine? Y Y  Comment - incontinence  Do you have problems with loss of bowel control? Y N  Managing your Medications? N Y  Comment - family manages  Managing your Finances? N Y  Comment - family manages  Housekeeping or managing your  Housekeeping? N Y  Comment - family helps  Some recent data might be hidden    Fall/Depression Screening: Fall Risk  10/29/2020 10/16/2020 08/27/2020  Falls in the past year? 0 0 0  Number falls in past yr: - 0 -  Injury with Fall? - 0 -  Follow up - Falls evaluation completed -   PHQ 2/9 Scores 10/16/2020 04/10/2020 04/08/2020 03/11/2020 10/04/2019 04/04/2019 01/27/2018  PHQ - 2 Score 0 0 0 0 0 0 0    Assessment:  Goals Addressed            This Visit's Progress   . THN-Track and Manage Fluids and Swelling   On track    Timeframe:  Long-Range Goal Priority:  High Start Date:   07/24/20                          Expected End Date:  03/19/21                    Follow Up Date 01/16/21   - use salt in moderation - watch for swelling in feet, ankles and legs every day - weigh myself daily    Why is this important?   It is important to check your weight daily and watch how much salt and liquids you have.  It will help you to manage your heart failure.    Notes: Patient reports that if her weight is over 149 to 151 lbs she is to take a fluid pill.  Weight this AM 150 lbs. Encouraged to continue to monitor weights and limit salt intake and salty food items.    10/29/20 Keep up the great work!!    . THN-Track and Manage Symptoms   On track    Timeframe:  Long-Range Goal Priority:  High Start Date:  07/24/20                           Expected End Date: 03/19/21                      Follow Up Date 10/29/20        Reiterated the following:   - follow rescue plan if symptoms flare-up - know when to call the doctor - track symptoms and what helps feel better or worse    Why is this important?   You will be able to handle your symptoms better if you keep track of them.  Making some simple changes to your lifestyle will help.  Eating healthy is one thing you can do to take good care of yourself.    Notes:        Plan: RN CM will follow up in June.  Follow-up:  Patient agrees to Care  Plan and Follow-up.   [name], RN, MSN [facility] Care Management Care Management Coordinator Direct Line [phone] Cell [phone] Toll Free: [phone]  Fax:  [phone]

## 2020-10-29 NOTE — Patient Instructions (Signed)
Goals Addressed            This Visit's Progress   . THN-Track and Manage Fluids and Swelling   On track    Timeframe:  Long-Range Goal Priority:  High Start Date:   07/24/20                          Expected End Date:  03/19/21                    Follow Up Date 01/16/21   - use salt in moderation - watch for swelling in feet, ankles and legs every day - weigh myself daily    Why is this important?   It is important to check your weight daily and watch how much salt and liquids you have.  It will help you to manage your heart failure.    Notes: Patient reports that if her weight is over 149 to 151 lbs she is to take a fluid pill.  Weight this AM 150 lbs. Encouraged to continue to monitor weights and limit salt intake and salty food items.    10/29/20 Keep up the great work!!    . THN-Track and Manage Symptoms   On track    Timeframe:  Long-Range Goal Priority:  High Start Date:  07/24/20                           Expected End Date: 03/19/21                      Follow Up Date 10/29/20        Reiterated the following:   - follow rescue plan if symptoms flare-up - know when to call the doctor - track symptoms and what helps feel better or worse    Why is this important?   You will be able to handle your symptoms better if you keep track of them.  Making some simple changes to your lifestyle will help.  Eating healthy is one thing you can do to take good care of yourself.    Notes:

## 2020-10-31 ENCOUNTER — Ambulatory Visit (HOSPITAL_COMMUNITY)
Admission: RE | Admit: 2020-10-31 | Discharge: 2020-10-31 | Disposition: A | Discharge status: Home / Self Care | Payer: Medicare PPO | Attending: Internal Medicine | Admitting: Internal Medicine

## 2020-10-31 ENCOUNTER — Encounter (HOSPITAL_COMMUNITY)
Admission: RE | Disposition: A | Discharge status: Home / Self Care | Payer: Self-pay | Source: Home / Self Care | Attending: Internal Medicine

## 2020-10-31 ENCOUNTER — Encounter (HOSPITAL_COMMUNITY): Payer: Self-pay | Admitting: Cardiology

## 2020-10-31 ENCOUNTER — Other Ambulatory Visit: Payer: Self-pay

## 2020-10-31 DIAGNOSIS — I5022 Chronic systolic (congestive) heart failure: Secondary | ICD-10-CM | POA: Insufficient documentation

## 2020-10-31 DIAGNOSIS — N1832 Chronic kidney disease, stage 3b: Secondary | ICD-10-CM | POA: Diagnosis not present

## 2020-10-31 DIAGNOSIS — I130 Hypertensive heart and chronic kidney disease with heart failure and stage 1 through stage 4 chronic kidney disease, or unspecified chronic kidney disease: Secondary | ICD-10-CM | POA: Insufficient documentation

## 2020-10-31 DIAGNOSIS — E1122 Type 2 diabetes mellitus with diabetic chronic kidney disease: Secondary | ICD-10-CM | POA: Insufficient documentation

## 2020-10-31 DIAGNOSIS — J9612 Chronic respiratory failure with hypercapnia: Secondary | ICD-10-CM | POA: Diagnosis not present

## 2020-10-31 DIAGNOSIS — G4733 Obstructive sleep apnea (adult) (pediatric): Secondary | ICD-10-CM | POA: Diagnosis not present

## 2020-10-31 DIAGNOSIS — E1142 Type 2 diabetes mellitus with diabetic polyneuropathy: Secondary | ICD-10-CM | POA: Diagnosis not present

## 2020-10-31 DIAGNOSIS — I2721 Secondary pulmonary arterial hypertension: Secondary | ICD-10-CM | POA: Diagnosis not present

## 2020-10-31 DIAGNOSIS — Z87891 Personal history of nicotine dependence: Secondary | ICD-10-CM | POA: Insufficient documentation

## 2020-10-31 DIAGNOSIS — I5042 Chronic combined systolic (congestive) and diastolic (congestive) heart failure: Secondary | ICD-10-CM

## 2020-10-31 DIAGNOSIS — J9611 Chronic respiratory failure with hypoxia: Secondary | ICD-10-CM | POA: Insufficient documentation

## 2020-10-31 DIAGNOSIS — Z95810 Presence of automatic (implantable) cardiac defibrillator: Secondary | ICD-10-CM | POA: Diagnosis not present

## 2020-10-31 HISTORY — PX: RIGHT HEART CATH: CATH118263

## 2020-10-31 LAB — POCT I-STAT EG7
Acid-Base Excess: 6 mmol/L — ABNORMAL HIGH (ref 0.0–2.0)
Acid-Base Excess: 6 mmol/L — ABNORMAL HIGH (ref 0.0–2.0)
Bicarbonate: 32.9 mmol/L — ABNORMAL HIGH (ref 20.0–28.0)
Bicarbonate: 33.1 mmol/L — ABNORMAL HIGH (ref 20.0–28.0)
Calcium, Ion: 1.15 mmol/L (ref 1.15–1.40)
Calcium, Ion: 1.23 mmol/L (ref 1.15–1.40)
HCT: 33 % — ABNORMAL LOW (ref 36.0–46.0)
HCT: 34 % — ABNORMAL LOW (ref 36.0–46.0)
Hemoglobin: 11.2 g/dL — ABNORMAL LOW (ref 12.0–15.0)
Hemoglobin: 11.6 g/dL — ABNORMAL LOW (ref 12.0–15.0)
O2 Saturation: 68 %
O2 Saturation: 68 %
Potassium: 3.7 mmol/L (ref 3.5–5.1)
Potassium: 3.8 mmol/L (ref 3.5–5.1)
Sodium: 143 mmol/L (ref 135–145)
Sodium: 144 mmol/L (ref 135–145)
TCO2: 35 mmol/L — ABNORMAL HIGH (ref 22–32)
TCO2: 35 mmol/L — ABNORMAL HIGH (ref 22–32)
pCO2, Ven: 59.4 mmHg (ref 44.0–60.0)
pCO2, Ven: 60 mmHg (ref 44.0–60.0)
pH, Ven: 7.349 (ref 7.250–7.430)
pH, Ven: 7.352 (ref 7.250–7.430)
pO2, Ven: 38 mmHg (ref 32.0–45.0)
pO2, Ven: 38 mmHg (ref 32.0–45.0)

## 2020-10-31 SURGERY — RIGHT HEART CATH
Anesthesia: LOCAL

## 2020-10-31 MED ORDER — MIDAZOLAM HCL 2 MG/2ML IJ SOLN
INTRAMUSCULAR | Status: AC
  Filled 2020-10-31: qty 2

## 2020-10-31 MED ORDER — SODIUM CHLORIDE 0.9 % IV SOLN: 250.0000 mL | INTRAVENOUS | Status: DC | PRN

## 2020-10-31 MED ORDER — SODIUM CHLORIDE 0.9% FLUSH: 3.0000 mL | Freq: Two times a day (BID) | INTRAVENOUS | Status: DC

## 2020-10-31 MED ORDER — ONDANSETRON HCL 4 MG/2ML IJ SOLN: 4.0000 mg | Freq: Four times a day (QID) | INTRAMUSCULAR | Status: DC | PRN

## 2020-10-31 MED ORDER — ASPIRIN 81 MG PO CHEW: 81.0000 mg | CHEWABLE_TABLET | ORAL | Status: AC

## 2020-10-31 MED ORDER — SODIUM CHLORIDE 0.9% FLUSH: 3.0000 mL | INTRAVENOUS | Status: DC | PRN

## 2020-10-31 MED ORDER — FENTANYL CITRATE (PF) 100 MCG/2ML IJ SOLN
INTRAMUSCULAR | Status: DC | PRN
  Administered 2020-10-31: 25 ug via INTRAVENOUS

## 2020-10-31 MED ORDER — LIDOCAINE HCL (PF) 1 % IJ SOLN
INTRAMUSCULAR | Status: DC | PRN
  Administered 2020-10-31: 2 mL

## 2020-10-31 MED ORDER — ACETAMINOPHEN 325 MG PO TABS: 650.0000 mg | ORAL_TABLET | ORAL | Status: DC | PRN

## 2020-10-31 MED ORDER — HEPARIN (PORCINE) IN NACL 1000-0.9 UT/500ML-% IV SOLN
INTRAVENOUS | Status: DC | PRN
  Administered 2020-10-31: 500 mL

## 2020-10-31 MED ORDER — SODIUM CHLORIDE 0.9 % IV SOLN: INTRAVENOUS | Status: DC

## 2020-10-31 MED ORDER — MIDAZOLAM HCL 2 MG/2ML IJ SOLN
INTRAMUSCULAR | Status: DC | PRN
  Administered 2020-10-31: 1 mg via INTRAVENOUS

## 2020-10-31 MED ORDER — HEPARIN (PORCINE) IN NACL 1000-0.9 UT/500ML-% IV SOLN
INTRAVENOUS | Status: AC
  Filled 2020-10-31: qty 500

## 2020-10-31 MED ORDER — FENTANYL CITRATE (PF) 100 MCG/2ML IJ SOLN
INTRAMUSCULAR | Status: AC
  Filled 2020-10-31: qty 2

## 2020-10-31 MED ORDER — LIDOCAINE HCL (PF) 1 % IJ SOLN
INTRAMUSCULAR | Status: AC
  Filled 2020-10-31: qty 30

## 2020-10-31 SURGICAL SUPPLY — 6 items
CATH SWAN GANZ 7F STRAIGHT (CATHETERS) ×2 IMPLANT
GUIDEWIRE .025 260CM (WIRE) ×2 IMPLANT
PACK CARDIAC CATHETERIZATION (CUSTOM PROCEDURE TRAY) ×2 IMPLANT
TRANSDUCER W/STOPCOCK (MISCELLANEOUS) ×2 IMPLANT
TUBING ART PRESS 72  MALE/FEM (TUBING) ×1
TUBING ART PRESS 72 MALE/FEM (TUBING) ×1 IMPLANT

## 2020-10-31 NOTE — Interval H&P Note (Signed)
History and Physical Interval Note:  10/31/2020 11:44 AM  [name]  has presented today for surgery, with the diagnosis of heart failure.  The various methods of treatment have been discussed with the patient and family. After consideration of risks, benefits and other options for treatment, the patient has consented to  Procedure(s): RIGHT HEART CATH (N/A) as a surgical intervention.  The patient's history has been reviewed, patient examined, no change in status, stable for surgery.  I have reviewed the patient's chart and labs.  Questions were answered to the patient's satisfaction.     Collier Salina [facility] 10/31/2020 11:45 AM

## 2020-10-31 NOTE — Progress Notes (Signed)
Discharge instructions reviewed with pt and her daughter both voice understanding.

## 2020-10-31 NOTE — H&P (Signed)
PCP: Harland Dingwall NP-C  HF Cardiologist: Dr Haroldine Laws Cardiology: Dr Loleta Chance Nephrology: Dr Resa Miner   Advanced HF TEAm H&P  HPI:  [name] is an 80 y/o w/longstanding chronic systolic heart failure/ NICM and pulmonary hypertension.   Per records, her systolic HF dates back to 2006. Mild nonobstructive CAD by LHC in 2006 and again in 2015. She is s/p Metronic ICD, also w/ restrictive lung disease, OSA on CPAP, essential hypertension, CKD, HLD and T2DM. Her daughter is a Therapist, sports for [employer].   Echo 2016 with LVEF 45% w/ mild AS, normal RV but severe pulmonary HTN, PASP 70. She has been followed by Dr. Halford Chessman for restrictive lung disease. Previous PFTs(2018)showed no obstruction but fairly significant restriction (65% of predicted)and markedly reduced DLCO(39%, corrected for alveolar volume).  Admitted 7/21 for acute on chronic hypoxic respiratory failure and acute on chronic systolic heart failure w/ volume overload. Hypoxic in the 80s on arrival and required NRB. LEE up to thighs. BNP 617. CXR w/ cardiomegaly, but no overt edema and no infiltrate or effusions. D-dimer elevated at 1.07 but V/Q scan negative for PE. Echo LVEF 45-50% w/ global hypokinesis. RV severely enlarged w/ moderately elevated pulmonary artery systolic pressure, 63.8 mmHg, + mod-severe TR. RHC showed severe PAH w/ normal left-sided pressures. CO/CI normal.  Admitted 7/56 with A/C Systolic HF. Diuresed with IV lasix and transitioned to torsemide 60 mg daily. Creatinine peaked at 3.42. Renal US was negative. Sildenafil and amlodipine both stopped due to hypotension. Discharged 03/07/2020.   On 04/08/20 Weight at home went down to 140. Dr Orene Desanctis  instructed to change torsemide to 20 mg as needed for weight 150 -152  Pounds.   WE saw her recently in Clinic and was NYHA II-III. Doing ADLs without too much problem. PFTs improved. Decision made to proceed with RHC to see if she merits a trial of selective pulmonary  vasodilators.     PFTs 2/22 FEV1 1.22 (91%) FVC 1.52 (87%) FEF 25-75% 1.19 (108%) DLCO 60%  PFTs 10/18: FEV1 1.06 (69%) FVC 1.48 (65%) FEF 25-75% 0.74 (40%) DLCO 7.8 39%  RHC Findings 02/15/20: RA = 20 RV = 72/27 PA = 79/44 (53) PCW = 18 Fick cardiac output/index = 5.35/2.9 Thermo CO/CI = 4.4/2.4 PVR = 6.5 (Fick) 8.0 (Thermo) Ao sat = 94% PA sat = 64%, 64% SVC sat = 61%  Assessment: 1. Severe PAH with normal left-sided pressures  Plan/Discussion: There is near equalization or diastolic pressures which can reflect restrictive physiology or elevated R-sided pressure with normal left pressures. Given relatively normal PCWP - restriction felt to be less likely.   ROS: All systems negative except as listed in HPI, PMH and Problem List.  SH:  Social History   Socioeconomic History  . Marital status: Widowed    Spouse name: Not on file  . Number of children: Not on file  . Years of education: Not on file  . Highest education level: Not on file  Occupational History  . Not on file  Tobacco Use  . Smoking status: Former Smoker    Quit date: 03/14/2009    Years since quitting: 11.6  . Smokeless tobacco: Never Used  . Tobacco comment: quit smoking 2010  Vaping Use  . Vaping Use: Never used  Substance and Sexual Activity  . Alcohol use: No  . Drug use: No  . Sexual activity: Not on file  Other Topics Concern  . Not on file  Social History Narrative   Lives alone.  2 daughters and 1 granddaughter in [place]   Social Determinants of Health   Financial Resource Strain: Not on file  Food Insecurity: No Food Insecurity  . Worried About Charity fundraiser in the Last Year: Never true  . Ran Out of Food in the Last Year: Never true  Transportation Needs: No Transportation Needs  . Lack of Transportation (Medical): No  . Lack of Transportation (Non-Medical): No  Physical Activity: Not on file  Stress: No Stress Concern Present  . Feeling of Stress : Not at all   Social Connections: Moderately Integrated  . Frequency of Communication with Friends and Family: More than three times a week  . Frequency of Social Gatherings with Friends and Family: More than three times a week  . Attends Religious Services: More than 4 times per year  . Active Member of Clubs or Organizations: Yes  . Attends Archivist Meetings: 1 to 4 times per year  . Marital Status: Widowed  Intimate Partner Violence: Not on file    FH:  Family History  Problem Relation Age of Onset  . Colon polyps Daughter   . Hypertension Daughter   . Hypertension Mother   . Stroke Mother   . Hypertension Father   . Diabetes Brother   . Hypertension Brother   . Hypertension Daughter   . Colon cancer Neg Hx   . Esophageal cancer Neg Hx   . Rectal cancer Neg Hx   . Stomach cancer Neg Hx     Past Medical History:  Diagnosis Date  . Anemia   . CAD (coronary artery disease)   . Cardiac defibrillator in situ   . Cataract    bilateral 2017  . CHF (congestive heart failure) ([place])   . Chronic systolic heart failure ([place])   . CKD (chronic kidney disease) stage 4, GFR 15-29 ml/min (HCC)   . COPD (chronic obstructive pulmonary disease) ([place])   . Dyspnea   . HTN (hypertension)   . 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 Rollene Rotunda in [place]. Per notes she was due for colonoscopy in 12/2016  . OSA on CPAP 09/08/2017  . Other cardiomyopathies ([place])   . Oxygen deficiency    2 liters at night  . Pulmonary HTN ([place])   . Sleep apnea    uses c-pap  . Stroke ([place])   . Type 2 diabetes, controlled, with peripheral neuropathy ([place])   . Urge incontinence of urine     Current Facility-Administered Medications  Medication Dose Route Frequency Provider Last Rate Last Admin  . 0.9 %  sodium chloride infusion  250 mL Intravenous PRN Milford, Jessica M, FNP      . 0.9 %  sodium chloride infusion   Intravenous Continuous Rafael Bihari, Hugo 10 mL/hr at 10/31/20 0542 New Bag  at 10/31/20 0542  . sodium chloride flush (NS) 0.9 % injection 3 mL  3 mL Intravenous Q12H Milford, Jessica M, FNP      . sodium chloride flush (NS) 0.9 % injection 3 mL  3 mL Intravenous PRN Rafael Bihari, FNP        Vitals:   10/31/20 0531  BP: 137/70  Pulse: 75  Temp: 98 F (36.7 C)  TempSrc: Oral  SpO2: 100%  Weight: 68.5 kg  Height: _0  (1.575 m)   Wt Readings from Last 3 Encounters:  10/31/20 68.5 kg  10/16/20 73.7 kg  09/09/20 71 kg   Device Interrogation: no AT/AF, patient activity ~  0.1 hr/day    PHYSICAL EXAM: General:  Well appearing. No resp difficulty HEENT: normal Neck: supple. no JVD. Carotids 2+ bilat; no bruits. No lymphadenopathy or thryomegaly appreciated. Cor: PMI nondisplaced. Regular rate & rhythm. No rubs, gallops or murmurs. Lungs: clear Abdomen: soft, nontender, nondistended. No hepatosplenomegaly. No bruits or masses. Good bowel sounds. Extremities: no cyanosis, clubbing, rash, edema Neuro: alert & orientedx3, cranial nerves grossly intact. moves all 4 extremities w/o difficulty. Affect pleasant   ASSESSMENT & PLAN:  1. PAH - RHC 01/2020 demonstrated severe PAH w/ normal left-sided pressures. PA = 79/44 (53). CO/CI normal. - Previously suspected predominently WHO Group 3 in the setting of restrictive lung disease and OSA. However PFTs have now normalized.  - Continue CPAP qhs - Sildenafil stopped during the August hospitalization due to hypotension.  - Feeling better NYHA II-III - Repeat PFTs are much improved. Will repeat echo and RHC to re-evaluate and reassess need for careful trial of pulmonary vasodilators - RHC today  2. Chronic Systolic Heart Failure w/ Predominant RV Failure - long standing history of chronic systolic heart failure/ NICM and pulmonary hypertension. Mild nonobstructive CAD by LHC in 2006 and again in 2015.  - Medtronic ICD Device Interrogation: no AT/AF, patient activity ~0.1 hr/day  - Echo 01/2020 EF 45-50%. RV  severely enlarged w/ moderately reduced systolic function 2/2 PH - NYHA III volume status ok  - Continue Coreg 3.125 bid - Continue torsemide prn - Consider SGLT2i in near future depending on results of echo and RHC - RHC today  3. Chronic hypoxic/hypercapnic respiratory failure - Multifactorial, 2/2 PAH, a/c CHF and restrictive lung disease  - Supp O2, SpO2 goal >88% - Continue pulmicort, duoneb - CPAP qhs - Needs to wear oxygen 2 liters continuously.  - PFTs 2/22 now normalized. Repeat echo and RHC as above   4. CKD Stage IIIb.  - Cr baseline ~1.5-1.9 - Followed by Dr. Carolynne Edouard  - Consider SGLT2i   5. HTN  - Elevated today. Allowing to run high per Nephrology.  - Can use Norvasc 29m PRN for SBP>140  6. T2DM - Per PCP   7. OSA - Continue CPAP qhs   DGlori Bickers MD  8:22 AM

## 2020-10-31 NOTE — Discharge Instructions (Signed)
This sheet gives you information about how to care for yourself after your procedure. Your health care provider may also give you more specific instructions. If you have problems or questions, contact your health care provider. What can I expect after the procedure? After the procedure, it is common to have:  Bruising and tenderness at the catheter insertion area. Follow these instructions at home: Medicines  Take over-the-counter and prescription medicines only as told by your health care provider. Insertion site care 1. Follow instructions from your health care provider about how to take care of your insertion site. Make sure you: ? Wash your hands with soap and water before you remove your bandage (dressing). If soap and water are not available, use hand sanitizer. ? May remove dressing in 24 hours. 2. Check your insertion site every day for signs of infection. Check for: ? Redness, swelling, or pain. ? Fluid or blood. ? Pus or a bad smell. ? Warmth. 3. Do no take baths, swim, or use a hot tub for 5 days. 4. You may shower 24-48 hours after the procedure. ? Remove the dressing and gently wash the site with plain soap and water. ? Pat the area dry with a clean towel. ? Do not rub the site. That could cause bleeding. 5. Do not apply powder or lotion to the site. Activity  1. For 24 hours after the procedure, or as directed by your health care provider: ? Do not flex or bend the affected arm. ? Do not push or pull heavy objects with the affected arm. ? Do not drive yourself home from the hospital or clinic. You may drive 24 hours after the procedure. ? Do not operate machinery or power tools. ? KEEP ARM ELEVATED THE REMAINDER OF THE DAY. 2. Do not push, pull or lift anything that is heavier than 10 lb for 5 days. 3. Ask your health care provider when it is okay to: ? Return to work or school. ? Resume usual physical activities or sports. ? Resume sexual activity. General  instructions  If the catheter site starts to bleed, raise your arm and put firm pressure on the site. If the bleeding does not stop, get help right away. This is a medical emergency.  DRINK PLENTY OF FLUIDS FOR THE NEXT 2-3 DAYS.  No alcohol consumption for 24 hours after receiving sedation.  If you went home on the same day as your procedure, a responsible adult should be with you for the first 24 hours after you arrive home.  Keep all follow-up visits as told by your health care provider. This is important. Contact a health care provider if:  You have a fever.  You have redness, swelling, or yellow drainage around your insertion site. Get help right away if:  You have unusual pain at the radial site.  The catheter insertion area swells very fast.  The insertion area is bleeding, and the bleeding does not stop when you hold steady pressure on the area.  Your arm or hand becomes pale, cool, tingly, or numb. These symptoms may represent a serious problem that is an emergency. Do not wait to see if the symptoms will go away. Get medical help right away. Call your local emergency services (911 in the [place] Do not drive yourself to the hospital. Summary  After the procedure, it is common to have bruising and tenderness at the site.  Follow instructions from your health care provider about how to take care of your  radial site wound. Check the wound every day for signs of infection.  This information is not intended to replace advice given to you by your health care provider. Make sure you discuss any questions you have with your health care provider. Document Revised: 08/11/2017 Document Reviewed: 08/11/2017 Elsevier Patient Education  2020 [employer].

## 2020-11-05 DIAGNOSIS — J449 Chronic obstructive pulmonary disease, unspecified: Secondary | ICD-10-CM | POA: Diagnosis not present

## 2020-11-05 DIAGNOSIS — R531 Weakness: Secondary | ICD-10-CM | POA: Diagnosis not present

## 2020-11-05 DIAGNOSIS — I10 Essential (primary) hypertension: Secondary | ICD-10-CM | POA: Diagnosis not present

## 2020-11-05 DIAGNOSIS — G4733 Obstructive sleep apnea (adult) (pediatric): Secondary | ICD-10-CM | POA: Diagnosis not present

## 2020-11-05 DIAGNOSIS — I5089 Other heart failure: Secondary | ICD-10-CM | POA: Diagnosis not present

## 2020-11-16 DIAGNOSIS — I10 Essential (primary) hypertension: Secondary | ICD-10-CM | POA: Diagnosis not present

## 2020-11-16 DIAGNOSIS — J449 Chronic obstructive pulmonary disease, unspecified: Secondary | ICD-10-CM | POA: Diagnosis not present

## 2020-11-16 DIAGNOSIS — I5089 Other heart failure: Secondary | ICD-10-CM | POA: Diagnosis not present

## 2020-11-16 DIAGNOSIS — G4733 Obstructive sleep apnea (adult) (pediatric): Secondary | ICD-10-CM | POA: Diagnosis not present

## 2020-11-29 ENCOUNTER — Other Ambulatory Visit: Payer: Self-pay

## 2020-11-29 ENCOUNTER — Ambulatory Visit: Payer: Medicare PPO | Admitting: Podiatry

## 2020-11-29 ENCOUNTER — Other Ambulatory Visit: Payer: Self-pay | Admitting: Family Medicine

## 2020-11-29 ENCOUNTER — Encounter: Payer: Self-pay | Admitting: Podiatry

## 2020-11-29 DIAGNOSIS — M79675 Pain in left toe(s): Secondary | ICD-10-CM

## 2020-11-29 DIAGNOSIS — M2042 Other hammer toe(s) (acquired), left foot: Secondary | ICD-10-CM

## 2020-11-29 DIAGNOSIS — M2041 Other hammer toe(s) (acquired), right foot: Secondary | ICD-10-CM

## 2020-11-29 DIAGNOSIS — L84 Corns and callosities: Secondary | ICD-10-CM | POA: Diagnosis not present

## 2020-11-29 DIAGNOSIS — B351 Tinea unguium: Secondary | ICD-10-CM

## 2020-11-29 DIAGNOSIS — M79674 Pain in right toe(s): Secondary | ICD-10-CM

## 2020-11-29 DIAGNOSIS — E1142 Type 2 diabetes mellitus with diabetic polyneuropathy: Secondary | ICD-10-CM

## 2020-11-29 DIAGNOSIS — N3941 Urge incontinence: Secondary | ICD-10-CM

## 2020-11-29 MED ORDER — MIRABEGRON ER 50 MG PO TB24: 50.0000 mg | ORAL_TABLET | ORAL | 0 refills | Status: DC | PRN

## 2020-12-04 ENCOUNTER — Ambulatory Visit (INDEPENDENT_AMBULATORY_CARE_PROVIDER_SITE_OTHER): Payer: Medicare PPO

## 2020-12-04 DIAGNOSIS — I5042 Chronic combined systolic (congestive) and diastolic (congestive) heart failure: Secondary | ICD-10-CM | POA: Diagnosis not present

## 2020-12-05 DIAGNOSIS — J449 Chronic obstructive pulmonary disease, unspecified: Secondary | ICD-10-CM | POA: Diagnosis not present

## 2020-12-05 DIAGNOSIS — G4733 Obstructive sleep apnea (adult) (pediatric): Secondary | ICD-10-CM | POA: Diagnosis not present

## 2020-12-05 DIAGNOSIS — I10 Essential (primary) hypertension: Secondary | ICD-10-CM | POA: Diagnosis not present

## 2020-12-05 DIAGNOSIS — R531 Weakness: Secondary | ICD-10-CM | POA: Diagnosis not present

## 2020-12-05 DIAGNOSIS — I5089 Other heart failure: Secondary | ICD-10-CM | POA: Diagnosis not present

## 2020-12-05 LAB — CUP PACEART REMOTE DEVICE CHECK
Battery Remaining Longevity: 25 mo
Battery Voltage: 2.93 V
Brady Statistic AP VP Percent: 0.05 %
Brady Statistic AP VS Percent: 51.83 %
Brady Statistic AS VP Percent: 0.03 %
Brady Statistic AS VS Percent: 48.09 %
Brady Statistic RA Percent Paced: 51.24 %
Brady Statistic RV Percent Paced: 0.09 %
Date Time Interrogation Session: 20220518022604
HighPow Impedance: 38 Ohm
HighPow Impedance: 55 Ohm
Implantable Pulse Generator Implant Date: 20131120
Lead Channel Impedance Value: 323 Ohm
Lead Channel Impedance Value: 342 Ohm
Lead Channel Impedance Value: 399 Ohm
Lead Channel Pacing Threshold Amplitude: 0.75 V
Lead Channel Pacing Threshold Amplitude: 0.875 V
Lead Channel Pacing Threshold Pulse Width: 0.4 ms
Lead Channel Pacing Threshold Pulse Width: 0.4 ms
Lead Channel Sensing Intrinsic Amplitude: 0.75 mV
Lead Channel Sensing Intrinsic Amplitude: 0.75 mV
Lead Channel Sensing Intrinsic Amplitude: 3.375 mV
Lead Channel Sensing Intrinsic Amplitude: 3.375 mV
Lead Channel Setting Pacing Amplitude: 1.5 V
Lead Channel Setting Pacing Amplitude: 2 V
Lead Channel Setting Pacing Pulse Width: 0.4 ms
Lead Channel Setting Sensing Sensitivity: 0.3 mV

## 2020-12-06 NOTE — Progress Notes (Signed)
  Subjective:  Patient ID: [name], female    DOB: 01/22/1941,  MRN: [id number]  [name] presents to clinic today for at risk foot care with history of diabetic neuropathy and callus(es) b/l feet and painful thick toenails that are difficult to trim. Painful toenails interfere with ambulation. Aggravating factors include wearing enclosed shoe gear. Pain is relieved with periodic professional debridement. Painful calluses are aggravated when weightbearing with and without shoegear. Pain is relieved with periodic professional debridement.   Patient is accompanied by her daughter, [name], on today's visit.  They voice no new pedal concerns.  Her blood glucose was 100 mg/dl today.  PCP is Harland Dingwall, NP-C, and last visit was 10/16/2020.  Allergies  Allergen Reactions  . Benazepril Hcl Other (See Comments)    Unknown  . Lotrel [Amlodipine Besy-Benazepril Hcl] Other (See Comments)    Hypotension   . Talwin [Pentazocine] Other (See Comments)    Hallucinations      Review of Systems: Negative except as noted in the HPI. Objective:   Constitutional [name] is a pleasant 80 y.o. African American female, WD, WN in NAD. AAO x 3.   Vascular Capillary fill time to digits <3 seconds b/l lower extremities. Faintly palpable DP pulse(s) b/l lower extremities. Nonpalpable PT pulse(s) b/l lower extremities. Pedal hair absent. Lower extremity skin temperature gradient within normal limits. No cyanosis or clubbing noted.  Neurologic Normal speech. Oriented to person, place, and time. Protective sensation intact 5/5 intact bilaterally with 10g monofilament b/l. Vibratory sensation intact b/l.  Dermatologic Pedal skin with normal turgor, texture and tone bilaterally. No open wounds bilaterally. No interdigital macerations bilaterally. Toenails 1-5 b/l elongated, discolored, dystrophic, thickened, crumbly with subungual debris and tenderness to dorsal palpation.  Hyperkeratotic lesion(s) submet head 1 left foot, submet head 1 right foot, submet head 5 left foot and submet head 5 right foot.  No erythema, no edema, no drainage, no fluctuance.  Orthopedic: Normal muscle strength 5/5 to all lower extremity muscle groups bilaterally. No pain crepitus or joint limitation noted with ROM b/l. Hammertoe(s) noted to the 2-5 bilaterally.   Radiographs: None Assessment:   1. Pain due to onychomycosis of toenails of both feet   2. Callus   3. Acquired hammertoes of both feet   4. Type 2 diabetes, controlled, with peripheral neuropathy ([name])    Plan:  Patient was evaluated and treated and all questions answered.  Onychomycosis with pain -Nails palliatively debridement as below -Educated on self-care  Procedure: Nail Debridement Rationale: Pain Type of Debridement: manual, sharp debridement. Instrumentation: Nail nipper, rotary burr. Number of Nails: 10 -Examined patient. -Continue diabetic foot care principles. -Patient to continue soft, supportive shoe gear daily. -Toenails 1-5 b/l were debrided in length and girth with sterile nail nippers and dremel without iatrogenic bleeding.  -Callus(es) submet head 1 left foot, submet head 1 right foot, submet head 5 left foot and submet head 5 right foot pared utilizing sterile scalpel blade without complication or incident. Total number debrided =4. -Patient to report any pedal injuries to medical professional immediately. -Patient/POA to call should there be question/concern in the interim.  Return in about 3 months (around 03/01/2021).  Marzetta Board, DPM

## 2020-12-16 DIAGNOSIS — I5089 Other heart failure: Secondary | ICD-10-CM | POA: Diagnosis not present

## 2020-12-16 DIAGNOSIS — J449 Chronic obstructive pulmonary disease, unspecified: Secondary | ICD-10-CM | POA: Diagnosis not present

## 2020-12-16 DIAGNOSIS — G4733 Obstructive sleep apnea (adult) (pediatric): Secondary | ICD-10-CM | POA: Diagnosis not present

## 2020-12-16 DIAGNOSIS — I10 Essential (primary) hypertension: Secondary | ICD-10-CM | POA: Diagnosis not present

## 2020-12-17 ENCOUNTER — Other Ambulatory Visit: Payer: Self-pay | Admitting: Family Medicine

## 2020-12-18 NOTE — Telephone Encounter (Signed)
Has appt in September will refill til then

## 2020-12-25 ENCOUNTER — Other Ambulatory Visit: Payer: Self-pay

## 2020-12-25 NOTE — Patient Outreach (Signed)
[name] [facility]) Care Management  [place]  12/25/2020   [name] 16-Jan-1941 [id number]  Subjective: Telephone call to patient for disease management support.  Patient doing well.  She reports weight 150 lbs and knows when to take extra fluid pill.  She denies any problems.    Objective:   Encounter Medications:  Outpatient Encounter Medications as of 12/25/2020  Medication Sig Note  . Alcohol Swabs (B-D SINGLE USE SWABS REGULAR) PADS USE&nbsp;&nbsp;TO TEST BLOOD SUGAR ONE TIME DAILY   . amLODipine (NORVASC) 5 MG tablet Take 1 tablet (5 mg total) by mouth at bedtime.   [name] aspirin 81 MG tablet Take 81 mg by mouth at bedtime.    [name] atorvastatin (LIPITOR) 40 MG tablet TAKE 1 TABLET EVERY DAY   . Blood Glucose Calibration (TRUE METRIX LEVEL 1) Low SOLN Use as directed   . Blood Glucose Monitoring Suppl (TRUE METRIX METER) DEVI Test daily  Pt will need a true metrix meter E11.9   . budesonide (PULMICORT) 0.5 MG/2ML nebulizer solution Take 2 mLs (0.5 mg total) by nebulization daily.   . carvedilol (COREG) 3.125 MG tablet Take 1 tablet (3.125 mg total) by mouth 2 (two) times daily with a meal.   . cholecalciferol (VITAMIN D3) 25 MCG (1000 UNIT) tablet Take 1,000 Units by mouth daily.   . ferrous sulfate 325 (65 FE) MG tablet Take 325 mg by mouth every Monday, Wednesday, and Friday.   . Glucose Blood (BLOOD GLUCOSE TEST STRIPS) STRP Test 1 time a day E11.9   . ipratropium-albuterol (DUONEB) 0.5-2.5 (3) MG/3ML SOLN INHALE THE CONTENTS OF 1 VIAL VIA NEBULIZER EVERY 6 HOURS AS NEEDED FOR WHEEZING, SHORTNESS OF BREATH (Patient taking differently: Inhale 3 mLs into the lungs every 6 (six) hours as needed (SOB/ Wheezing).) 10/31/2020: Never needed   . LINZESS 145 MCG CAPS capsule TAKE 1 CAPSULE EVERY DAY BEFORE BREAKFAST (Patient taking differently: Take 145 mcg by mouth daily before breakfast.)   . mirabegron ER (MYRBETRIQ) 50 MG TB24 tablet Take 1 tablet (50 mg total) by  mouth as needed. Overactive bladder   . NONFORMULARY OR COMPOUNDED ITEM Antifungal solution: Terbinafine 3%, Fluconazole 2%, Tea Tree Oil 5%, Urea 10%, Ibuprofen 2% in DMSO suspension #30m (Patient taking differently: Apply 1 application topically daily. Antifungal solution: Terbinafine 3%, Fluconazole 2%, Tea Tree Oil 5%, Urea 10%, Ibuprofen 2% in DMSO suspension #3109m   . OXYGEN Inhale 2 L into the lungs at bedtime. Uses during the day as needed   . potassium chloride (KLOR-CON) 10 MEQ tablet TAKE 2 TABLETS(20 MEQ) BY MOUTH DAILY (Patient taking differently: Take 20 mEq by mouth daily as needed (Takes with Torsemide).)   . torsemide (DEMADEX) 20 MG tablet Take 3 tablets (60 mg total) by mouth as needed. (Patient taking differently: Take 60 mg by mouth daily as needed (fluid).)   . TRUEplus Lancets 33G MISC Test once a day    No facility-administered encounter medications on file as of 12/25/2020.    Functional Status:  In your present state of health, do you have any difficulty performing the following activities: 12/25/2020 10/29/2020  Hearing? N N  Vision? N N  Difficulty concentrating or making decisions? N N  Walking or climbing stairs? Y Tempie DonningComment needs assist needs assist  Dressing or bathing? N N  Doing errands, shopping? Y Tempie DonningComment family assists family assists  Preparing Food and eating ? N N  Using the Toilet? N N  In the  past six months, have you accidently leaked urine? Y N  Comment incontinence at times incontinence  Do you have problems with loss of bowel control? N N  Managing your Medications? [name]  Comment family assists family assists  Managing your Finances? [name]  Comment family assists family assists  Housekeeping or managing your Housekeeping? [name]  Comment family assists family assists  Some recent data might be hidden    Fall/Depression Screening: Fall Risk  12/25/2020 10/29/2020 10/16/2020  Falls in the past year? 0 0 0  Number falls in past yr: - - 0  Injury  with Fall? - - 0  Follow up - - Falls evaluation completed   PHQ 2/9 Scores 12/25/2020 10/16/2020 04/10/2020 04/08/2020 03/11/2020 10/04/2019 04/04/2019  PHQ - 2 Score 0 0 0 0 0 0 0    Assessment:   Care Plan Care Plan : Heart Failure (Adult)  Updates made by Jon Billings, RN since 12/25/2020 12:00 AM    Problem: Symptom Exacerbation (Heart Failure)     Long-Range Goal: Symptom Exacerbation Prevented or Minimized as evidenced by no acute episode of heart failure   Start Date: 04/23/2020  Expected End Date: 07/19/2021  This Visit's Progress: On track  Recent Progress: On track  Priority: High  Note:   Establish a mutually-agreed-upon early intervention process to communicate with primary care provider when signs/symptoms worsen.    Notes: 10/29/20 Patient follows parameters if weight about 151 lbs.    6.8.22 Patient weight 150 lbs this am. Patient has parameters of 149-151 lbs.  Knows when to take extra lasix.   Discussed Heart Failure Management Please weight daily or as ordered by your doctor Report to your doctor weight gain of 2 -3 pounds in a day or 5 pounds in a week Limit salt intake Monitor for shortness of breath, swelling of feet, ankles or abdomen and weight gain    Task: Identify and Minimize Risk of Heart Failure Exacerbation   Due Date: 03/19/2021  Priority: Routine  Responsible User: Jon Billings, RN  Note:   Care Management Activities:    - depression screen reviewed - healthy lifestyle promoted - rescue (action) plan reviewed    Notes: 08/27/20 Patient knows weight parameters of when to take extra lasix and potassium. Patient weight 151.2 lbs  10/29/20 Reiterated the importance of following parameters, limiting salt intake. 12/25/20 weight 150 lbs     Goals Addressed            This Visit's Progress   . COMPLETED: THN-Track and Manage Fluids and Swelling   On track    Timeframe:  Long-Range Goal Priority:  High Start Date:   07/24/20                           Expected End Date:  03/19/21                    Follow Up Date 01/16/21   - use salt in moderation - watch for swelling in feet, ankles and legs every day - weigh myself daily    Why is this important?   It is important to check your weight daily and watch how much salt and liquids you have.  It will help you to manage your heart failure.    Notes: Patient reports that if her weight is over 149 to 151 lbs she is to take a fluid pill.  Weight this AM 150 lbs.  Encouraged to continue to monitor weights and limit salt intake and salty food items.    10/29/20 Keep up the great work!!    . THN-Track and Manage Symptoms   On track    Barriers: Health Behaviors Knowledge  Timeframe:  Long-Range Goal Priority:  High Start Date:  07/24/20                           Expected End Date: 07/19/21                      Follow Up Date 03/19/21        Reiterated the following:   - follow rescue plan if symptoms flare-up - know when to call the doctor - track symptoms and what helps feel better or worse    Why is this important?   You will be able to handle your symptoms better if you keep track of them.  Making some simple changes to your lifestyle will help.  Eating healthy is one thing you can do to take good care of yourself.    Notes:  12/25/20 Patient weight 150 lbs this am. Patient has parameters of 149-151 lbs.  Knows when to take extra lasix.   Discussed Heart Failure Management Please weight daily or as ordered by your doctor Report to your doctor weight gain of 2 -3 pounds in a day or 5 pounds in a week Limit salt intake Monitor for shortness of breath, swelling of feet, ankles or abdomen and weight gain       Plan: RN CM will follow up in August.  Follow-up: Patient agrees to Care Plan and Follow-up. Follow-up in 2 month(s)   [name], RN, MSN [facility] Care Management Coordinator Direct Line [phone] Cell [phone] Toll Free: [phone]  Fax:  [phone]

## 2020-12-25 NOTE — Patient Instructions (Signed)
Goals Addressed            This Visit's Progress   . COMPLETED: THN-Track and Manage Fluids and Swelling   On track    Timeframe:  Long-Range Goal Priority:  High Start Date:   07/24/20                          Expected End Date:  03/19/21                    Follow Up Date 01/16/21   - use salt in moderation - watch for swelling in feet, ankles and legs every day - weigh myself daily    Why is this important?   It is important to check your weight daily and watch how much salt and liquids you have.  It will help you to manage your heart failure.    Notes: Patient reports that if her weight is over 149 to 151 lbs she is to take a fluid pill.  Weight this AM 150 lbs. Encouraged to continue to monitor weights and limit salt intake and salty food items.    10/29/20 Keep up the great work!!    . THN-Track and Manage Symptoms   On track    Barriers: Health Behaviors Knowledge  Timeframe:  Long-Range Goal Priority:  High Start Date:  07/24/20                           Expected End Date: 07/19/21                      Follow Up Date 03/19/21        Reiterated the following:   - follow rescue plan if symptoms flare-up - know when to call the doctor - track symptoms and what helps feel better or worse    Why is this important?   You will be able to handle your symptoms better if you keep track of them.  Making some simple changes to your lifestyle will help.  Eating healthy is one thing you can do to take good care of yourself.    Notes:  12/25/20 Patient weight 150 lbs this am. Patient has parameters of 149-151 lbs.  Knows when to take extra lasix.   Discussed Heart Failure Management Please weight daily or as ordered by your doctor Report to your doctor weight gain of 2 -3 pounds in a day or 5 pounds in a week Limit salt intake Monitor for shortness of breath, swelling of feet, ankles or abdomen and weight gain

## 2020-12-26 NOTE — Progress Notes (Signed)
Remote ICD transmission.   

## 2021-01-02 ENCOUNTER — Other Ambulatory Visit: Payer: Self-pay | Admitting: Family Medicine

## 2021-01-02 DIAGNOSIS — Z1231 Encounter for screening mammogram for malignant neoplasm of breast: Secondary | ICD-10-CM

## 2021-01-05 DIAGNOSIS — J449 Chronic obstructive pulmonary disease, unspecified: Secondary | ICD-10-CM | POA: Diagnosis not present

## 2021-01-05 DIAGNOSIS — I5089 Other heart failure: Secondary | ICD-10-CM | POA: Diagnosis not present

## 2021-01-05 DIAGNOSIS — I10 Essential (primary) hypertension: Secondary | ICD-10-CM | POA: Diagnosis not present

## 2021-01-05 DIAGNOSIS — R531 Weakness: Secondary | ICD-10-CM | POA: Diagnosis not present

## 2021-01-05 DIAGNOSIS — G4733 Obstructive sleep apnea (adult) (pediatric): Secondary | ICD-10-CM | POA: Diagnosis not present

## 2021-01-08 DIAGNOSIS — I10 Essential (primary) hypertension: Secondary | ICD-10-CM | POA: Diagnosis not present

## 2021-01-08 DIAGNOSIS — G4733 Obstructive sleep apnea (adult) (pediatric): Secondary | ICD-10-CM | POA: Diagnosis not present

## 2021-01-08 DIAGNOSIS — J449 Chronic obstructive pulmonary disease, unspecified: Secondary | ICD-10-CM | POA: Diagnosis not present

## 2021-01-09 ENCOUNTER — Other Ambulatory Visit (HOSPITAL_COMMUNITY): Payer: Medicare PPO

## 2021-01-09 ENCOUNTER — Other Ambulatory Visit: Payer: Self-pay | Admitting: Family Medicine

## 2021-01-09 ENCOUNTER — Encounter (HOSPITAL_COMMUNITY): Payer: Medicare PPO | Admitting: Internal Medicine

## 2021-01-09 DIAGNOSIS — N3941 Urge incontinence: Secondary | ICD-10-CM

## 2021-01-15 ENCOUNTER — Encounter (HOSPITAL_COMMUNITY): Payer: Medicare PPO | Admitting: Internal Medicine

## 2021-01-16 DIAGNOSIS — I5089 Other heart failure: Secondary | ICD-10-CM | POA: Diagnosis not present

## 2021-01-16 DIAGNOSIS — I10 Essential (primary) hypertension: Secondary | ICD-10-CM | POA: Diagnosis not present

## 2021-01-16 DIAGNOSIS — J449 Chronic obstructive pulmonary disease, unspecified: Secondary | ICD-10-CM | POA: Diagnosis not present

## 2021-01-16 DIAGNOSIS — G4733 Obstructive sleep apnea (adult) (pediatric): Secondary | ICD-10-CM | POA: Diagnosis not present

## 2021-02-04 ENCOUNTER — Ambulatory Visit: Payer: Medicare PPO | Admitting: Pulmonary Disease

## 2021-02-04 ENCOUNTER — Other Ambulatory Visit: Payer: Self-pay

## 2021-02-04 ENCOUNTER — Encounter: Payer: Self-pay | Admitting: Pulmonary Disease

## 2021-02-04 VITALS — BP 110/70 | HR 51 | Ht 62.0 in | Wt 161.6 lb

## 2021-02-04 DIAGNOSIS — R531 Weakness: Secondary | ICD-10-CM | POA: Diagnosis not present

## 2021-02-04 DIAGNOSIS — Z9989 Dependence on other enabling machines and devices: Secondary | ICD-10-CM

## 2021-02-04 DIAGNOSIS — J449 Chronic obstructive pulmonary disease, unspecified: Secondary | ICD-10-CM | POA: Diagnosis not present

## 2021-02-04 DIAGNOSIS — J984 Other disorders of lung: Secondary | ICD-10-CM

## 2021-02-04 DIAGNOSIS — G4733 Obstructive sleep apnea (adult) (pediatric): Secondary | ICD-10-CM | POA: Diagnosis not present

## 2021-02-04 DIAGNOSIS — I2721 Secondary pulmonary arterial hypertension: Secondary | ICD-10-CM

## 2021-02-04 DIAGNOSIS — I10 Essential (primary) hypertension: Secondary | ICD-10-CM | POA: Diagnosis not present

## 2021-02-04 DIAGNOSIS — J9611 Chronic respiratory failure with hypoxia: Secondary | ICD-10-CM

## 2021-02-04 DIAGNOSIS — I5089 Other heart failure: Secondary | ICD-10-CM | POA: Diagnosis not present

## 2021-02-04 NOTE — Progress Notes (Signed)
Pulmonary, Critical Care, and Sleep Medicine  Chief Complaint  Patient presents with   Follow-up    6 month follow up for COPD. No concerns today.     Constitutional:  BP 110/70 (BP Location: Left Arm, Patient Position: Sitting, Cuff Size: Normal)   Pulse (!) 51   Ht _0  (1.575 m)   Wt 161 lb 9.6 oz (73.3 kg)   SpO2 91%   BMI 29.56 kg/m   Past Medical History:  Anemia, CAD, Systolic CHF, CKD, HTN, Rectal cancer, CVA, DM  Past Surgical History:  She  has a past surgical history that includes Upper gi endoscopy (11/28/2009); Upper gastrointestinal endoscopy; Colonoscopy; Tonsillectomy and adenoidectomy; Tubal ligation; Colonoscopy with propofol (N/A, 03/29/2018); Esophagogastroduodenoscopy (egd) with propofol (N/A, 03/29/2018); biopsy (03/29/2018); polypectomy (03/29/2018); RIGHT HEART CATH (N/A, 02/15/2020); and RIGHT HEART CATH (N/A, 10/31/2020).  Brief Summary:  [name] is a 80 y.o. female former smoker with OSA, chronic respiratory failure and pulmonary hypertension.      Subjective:   She is here with her daughter.  She uses 2 liters oxygen intermittently during the day.  She is using CPAP at night with 2 liters oxygen.  No issues with mask fit.  Not having sinus congestion, sore throat, dry mouth, or cough.  Doesn't feel like nebulizer therapy helps.  Physical Exam:   Appearance - well kempt, wearing oxygen  ENMT - no sinus tenderness, no oral exudate, no LAN, Mallampati 3 airway, no stridor  Respiratory - equal breath sounds bilaterally, no wheezing or rales  CV - s1s2 regular rate and rhythm, no murmurs  Ext - no clubbing, no edema  Skin - no rashes  Psych - normal mood and affect   Pulmonary testing:  PFT 06/28/07 >> FEV1 0.73 (39%), FEV1% 92, TLC 2.41 (51%) PFT 05/04/17 >> FEV1 1.22 (79%), FEV1% 69, TLC 3.12 (75%), DLCO 39%, +BD PFT 08/26/20 >> FEV1 1.27 (95%), FEV1% 80, TLC 3.48 (73%), DLCO 60%  Chest Imaging:  HRCT chest 02/15/20 >>  small b/l effusions, cardiomegaly, atherosclerosis, enlarged pulmonary artery 3.6 cm  Sleep Tests:  PSG 10/02/13 >> AHI 59.6, SpO2 low 80% PSG 07/05/14 >> AHI 15.1, SpO2 low 76% CPAP 01/06/18 to 04/05/18 >> used on 90 of 90 nights with average 9 hrs 9 min.  Average AHI 8.2 with CPAP 18 cm H2O. PSG 04/05/20 >> AHI 6.4, SpO2 low 83%, needed 2 liters Auto CPAP 01/04/21 to 02/02/21 >> used on 30 of 30 nights with average 8 hrs 39 min.  Average AHI 2.7 with median CPAP 14 and 95 th percentile CPAP 19 cm H2O  Cardiac Tests:  Echo 10/16/20 >> EF 55 to 60%, grade 1 DD, RVSP 72.6 mmHg, mod LA/RA dilation  Social History:  She  reports that she quit smoking about 11 years ago. Her smoking use included cigarettes. She has never used smokeless tobacco. She reports that she does not drink alcohol and does not use drugs.  Family History:  Her family history includes Colon polyps in her daughter; Diabetes in her brother; Hypertension in her brother, daughter, daughter, father, and mother; Stroke in her mother.     Assessment/Plan:   Diffusion defect on PFT. - previous concern for emphysema, but not evident on recent CT chest - PFT numbers have improved - will d/c pulmicort - can continue duoneb prn  Obstructive sleep apnea. - she is compliant with CPAP and reports benefit from therapy - uses [employer] for her DME - continue auto CPAP 4  to 20 cm H2O with 2 liters oxygen at night   Chronic respiratory failure with hypoxia. - goal SpO2 > 90% - uses 2 liters with exertion and with CPAP at night  Pulmonary hypertension, chronic combined CHF. - followed by Dr. Pierre Bali with [facility] Heart Failure team  CKD 3b. - followed by Dr. Carolynne Edouard with [facility]  Time Spent Involved in Patient Care on Day of Examination:  37 minutes  Follow up:   Patient Instructions  Can stop pulmicort  Can use duoneb every 6 hours as needed for cough, wheeze, or chest congestion  Follow up in 6  months  Medication List:   Allergies as of 02/04/2021       Reactions   Benazepril Hcl Other (See Comments)   Unknown   Lotrel [amlodipine Besy-benazepril Hcl] Other (See Comments)   Hypotension   Talwin [pentazocine] Other (See Comments)   Hallucinations          Medication List        Accurate as of February 04, 2021  1:03 PM. If you have any questions, ask your nurse or doctor.          STOP taking these medications    budesonide 0.5 MG/2ML nebulizer solution Commonly known as: PULMICORT Stopped by: [name], MD       TAKE these medications    amLODipine 5 MG tablet Commonly known as: NORVASC Take 1 tablet (5 mg total) by mouth at bedtime.   aspirin 81 MG tablet Take 81 mg by mouth at bedtime.   atorvastatin 40 MG tablet Commonly known as: LIPITOR TAKE 1 TABLET EVERY DAY   B-D SINGLE USE SWABS REGULAR Pads USE&nbsp;&nbsp;TO TEST BLOOD SUGAR ONE TIME DAILY   BLOOD GLUCOSE TEST STRIPS Strp Test 1 time a day E11.9   carvedilol 3.125 MG tablet Commonly known as: COREG Take 1 tablet (3.125 mg total) by mouth 2 (two) times daily with a meal.   cholecalciferol 25 MCG (1000 UNIT) tablet Commonly known as: VITAMIN D3 Take 1,000 Units by mouth daily.   ferrous sulfate 325 (65 FE) MG tablet Take 325 mg by mouth every Monday, Wednesday, and Friday.   ipratropium-albuterol 0.5-2.5 (3) MG/3ML Soln Commonly known as: DUONEB INHALE THE CONTENTS OF 1 VIAL VIA NEBULIZER EVERY 6 HOURS AS NEEDED FOR WHEEZING, SHORTNESS OF BREATH What changed: See the new instructions.   Linzess 145 MCG Caps capsule Generic drug: linaclotide TAKE 1 CAPSULE EVERY DAY BEFORE BREAKFAST What changed: See the new instructions.   Myrbetriq 50 MG Tb24 tablet Generic drug: mirabegron ER TAKE 1 TABLET BY MOUTH AS NEEDED FOR OVERACTIVE BLADDER   NONFORMULARY OR COMPOUNDED ITEM Antifungal solution: Terbinafine 3%, Fluconazole 2%, Tea Tree Oil 5%, Urea 10%, Ibuprofen 2% in DMSO  suspension #67m What changed:  how much to take how to take this when to take this   OXYGEN Inhale 2 L into the lungs at bedtime. Uses during the day as needed   potassium chloride 10 MEQ tablet Commonly known as: KLOR-CON TAKE 2 TABLETS(20 MEQ) BY MOUTH DAILY What changed:  how much to take how to take this when to take this reasons to take this additional instructions   torsemide 20 MG tablet Commonly known as: DEMADEX Take 3 tablets (60 mg total) by mouth as needed. What changed:  when to take this reasons to take this   True Metrix Level 1 Low Soln Use as directed   True Metrix Meter Devi Test daily  Pt  will need a true metrix meter E11.9   TRUEplus Lancets 33G Misc Test once a day        Signature:  [name], MD [place] Pager - [phone] 02/04/2021, 1:03 PM

## 2021-02-04 NOTE — Patient Instructions (Signed)
Can stop pulmicort  Can use duoneb every 6 hours as needed for cough, wheeze, or chest congestion  Follow up in 6 months

## 2021-02-15 DIAGNOSIS — G4733 Obstructive sleep apnea (adult) (pediatric): Secondary | ICD-10-CM | POA: Diagnosis not present

## 2021-02-15 DIAGNOSIS — I5089 Other heart failure: Secondary | ICD-10-CM | POA: Diagnosis not present

## 2021-02-15 DIAGNOSIS — I10 Essential (primary) hypertension: Secondary | ICD-10-CM | POA: Diagnosis not present

## 2021-02-15 DIAGNOSIS — J449 Chronic obstructive pulmonary disease, unspecified: Secondary | ICD-10-CM | POA: Diagnosis not present

## 2021-02-26 ENCOUNTER — Other Ambulatory Visit: Payer: Self-pay

## 2021-02-26 NOTE — Patient Outreach (Signed)
[name]) Care Management  [place]  02/26/2021   [name] [birth year] [id number]  Subjective: Telephone call to patient for disease management. Patient reports doing great.  Continued heart failure management encouraged.  Objective:   Encounter Medications:  Outpatient Encounter Medications as of 02/26/2021  Medication Sig Note   Alcohol Swabs (B-D SINGLE USE SWABS REGULAR) PADS USE&nbsp;&nbsp;TO TEST BLOOD SUGAR ONE TIME DAILY    amLODipine (NORVASC) 5 MG tablet Take 1 tablet (5 mg total) by mouth at bedtime.    aspirin 81 MG tablet Take 81 mg by mouth at bedtime.     atorvastatin (LIPITOR) 40 MG tablet TAKE 1 TABLET EVERY DAY    Blood Glucose Calibration (TRUE METRIX LEVEL 1) Low SOLN Use as directed    Blood Glucose Monitoring Suppl (TRUE METRIX METER) DEVI Test daily  Pt will need a true metrix meter E11.9    carvedilol (COREG) 3.125 MG tablet Take 1 tablet (3.125 mg total) by mouth 2 (two) times daily with a meal.    cholecalciferol (VITAMIN D3) 25 MCG (1000 UNIT) tablet Take 1,000 Units by mouth daily.    ferrous sulfate 325 (65 FE) MG tablet Take 325 mg by mouth every Monday, Wednesday, and Friday.    Glucose Blood (BLOOD GLUCOSE TEST STRIPS) STRP Test 1 time a day E11.9    ipratropium-albuterol (DUONEB) 0.5-2.5 (3) MG/3ML SOLN INHALE THE CONTENTS OF 1 VIAL VIA NEBULIZER EVERY 6 HOURS AS NEEDED FOR WHEEZING, SHORTNESS OF BREATH (Patient taking differently: Inhale 3 mLs into the lungs every 6 (six) hours as needed (SOB/ Wheezing).) 10/31/2020: Never needed    LINZESS 145 MCG CAPS capsule TAKE 1 CAPSULE EVERY DAY BEFORE BREAKFAST (Patient taking differently: Take 145 mcg by mouth daily before breakfast.)    MYRBETRIQ 50 MG TB24 tablet TAKE 1 TABLET BY MOUTH AS NEEDED FOR OVERACTIVE BLADDER    NONFORMULARY OR COMPOUNDED ITEM Antifungal solution: Terbinafine 3%, Fluconazole 2%, Tea Tree Oil 5%, Urea 10%, Ibuprofen 2% in DMSO suspension #65m (Patient  taking differently: Apply 1 application topically daily. Antifungal solution: Terbinafine 3%, Fluconazole 2%, Tea Tree Oil 5%, Urea 10%, Ibuprofen 2% in DMSO suspension #37m    OXYGEN Inhale 2 L into the lungs at bedtime. Uses during the day as needed    potassium chloride (KLOR-CON) 10 MEQ tablet TAKE 2 TABLETS(20 MEQ) BY MOUTH DAILY (Patient taking differently: Take 20 mEq by mouth daily as needed (Takes with Torsemide).)    torsemide (DEMADEX) 20 MG tablet Take 3 tablets (60 mg total) by mouth as needed. (Patient taking differently: Take 60 mg by mouth daily as needed (fluid).)    TRUEplus Lancets 33G MISC Test once a day    No facility-administered encounter medications on file as of 02/26/2021.    Functional Status:  In your present state of health, do you have any difficulty performing the following activities: 02/26/2021 12/25/2020  Hearing? N N  Vision? N N  Difficulty concentrating or making decisions? N N  Walking or climbing stairs? Y Erin DonningComment needs assist needs assist  Dressing or bathing? N N  Doing errands, shopping? Y Erin DonningComment family assists family assists  Preparing Food and eating ? N N  Using the Toilet? N N  In the past six months, have you accidently leaked urine? Y Y  Comment incontinent at times incontinence at times  Do you have problems with loss of bowel control? N N  Managing your Medications? Y Erin DonningComment  family assists family assists  Managing your Finances? [name]  Comment family assists family assists  Housekeeping or managing your Housekeeping? [name]  Comment family assists family assists  Some recent data might be hidden    Fall/Depression Screening: Fall Risk  02/26/2021 12/25/2020 10/29/2020  Falls in the past year? 0 0 0  Number falls in past yr: - - -  Injury with Fall? - - -  Follow up - - -   PHQ 2/9 Scores 12/25/2020 10/16/2020 04/10/2020 04/08/2020 03/11/2020 10/04/2019 04/04/2019  PHQ - 2 Score 0 0 0 0 0 0 0    Assessment:   Care Plan Care Plan  : Heart Failure (Adult)  Updates made by Jon Billings, RN since 02/26/2021 12:00 AM     Problem: Symptom Exacerbation (Heart Failure)      Long-Range Goal: Symptom Exacerbation Prevented or Minimized as evidenced by no acute episode of heart failure   Start Date: 04/23/2020  Expected End Date: 07/19/2021  This Visit's Progress: On track  Recent Progress: On track  Priority: High  Note:   Establish a mutually-agreed-upon early intervention process to communicate with primary care provider when signs/symptoms worsen.    Notes: 10/29/20 Patient follows parameters if weight about 151 lbs.    6.8.22 Patient weight 150 lbs this am. Patient has parameters of 149-151 lbs.  Knows when to take extra lasix.   Discussed Heart Failure Management Please weight daily or as ordered by your doctor Report to your doctor weight gain of 2 -3 pounds in a day or 5 pounds in a week Limit salt intake Monitor for shortness of breath, swelling of feet, ankles or abdomen and weight gain 02/26/21 Patient last weight 150 lbs. Patient weighing daily.  Patient has used as needed lasix dose about 2 a month after eating out.  Heart Failure Management reiterated.     Task: Identify and Minimize Risk of Heart Failure Exacerbation   Due Date: 03/19/2021  Priority: Routine  Responsible User: Jon Billings, RN  Note:   Care Management Activities:    - depression screen reviewed - healthy lifestyle promoted - rescue (action) plan reviewed    Notes: 08/27/20 Patient knows weight parameters of when to take extra lasix and potassium. Patient weight 151.2 lbs  10/29/20 Reiterated the importance of following parameters, limiting salt intake. 12/25/20 weight 150 lbs 02/26/21 Patient last weight 150 lbs. Patient weighing daily.  Patient has used as needed lasix dose about 2 a month after eating out.  Heart Failure Management reiterated.      Goals Addressed             This Visit's Progress    THN-Track and Manage  Symptoms   On track    Barriers: Health Behaviors Knowledge  Timeframe:  Long-Range Goal Priority:  High Start Date:  07/24/20                           Expected End Date: 07/19/21                      Follow Up Date 03/19/21        Reiterated the following:   - follow rescue plan if symptoms flare-up - know when to call the doctor - track symptoms and what helps feel better or worse    Why is this important?   You will be able to handle your symptoms better if you keep track of  them.  Making some simple changes to your lifestyle will help.  Eating healthy is one thing you can do to take good care of yourself.    Notes:  12/25/20 Patient weight 150 lbs this am. Patient has parameters of 149-151 lbs.  Knows when to take extra lasix.   Discussed Heart Failure Management Please weight daily or as ordered by your doctor Report to your doctor weight gain of 2 -3 pounds in a day or 5 pounds in a week Limit salt intake Monitor for shortness of breath, swelling of feet, ankles or abdomen and weight gain 02/26/21 Patient last weight 150 lbs. Patient weighing daily.  Patient has used as needed lasix dose about 2 a month after eating out.  Heart Failure Management reiterated. Keep up the great work!!        Plan:  Follow-up: Patient agrees to Care Plan and Follow-up. Follow-up in 2 month(s)  [name], RN, MSN [place] Management Care Management Coordinator Direct Line [phone] Cell [phone] Toll Free: [phone]  Fax: [phone]

## 2021-03-03 ENCOUNTER — Ambulatory Visit: Payer: Medicare PPO | Admitting: Podiatry

## 2021-03-03 ENCOUNTER — Other Ambulatory Visit: Payer: Self-pay

## 2021-03-03 ENCOUNTER — Encounter: Payer: Self-pay | Admitting: Podiatry

## 2021-03-03 ENCOUNTER — Ambulatory Visit: Payer: Medicare PPO

## 2021-03-03 DIAGNOSIS — L84 Corns and callosities: Secondary | ICD-10-CM | POA: Diagnosis not present

## 2021-03-03 DIAGNOSIS — B351 Tinea unguium: Secondary | ICD-10-CM

## 2021-03-03 DIAGNOSIS — E119 Type 2 diabetes mellitus without complications: Secondary | ICD-10-CM | POA: Diagnosis not present

## 2021-03-03 DIAGNOSIS — M79675 Pain in left toe(s): Secondary | ICD-10-CM

## 2021-03-03 DIAGNOSIS — M79674 Pain in right toe(s): Secondary | ICD-10-CM | POA: Diagnosis not present

## 2021-03-05 ENCOUNTER — Ambulatory Visit (INDEPENDENT_AMBULATORY_CARE_PROVIDER_SITE_OTHER): Payer: Medicare PPO

## 2021-03-05 DIAGNOSIS — I428 Other cardiomyopathies: Secondary | ICD-10-CM

## 2021-03-05 LAB — CUP PACEART REMOTE DEVICE CHECK
Battery Remaining Longevity: 21 mo
Battery Voltage: 2.92 V
Brady Statistic AP VP Percent: 0.16 %
Brady Statistic AP VS Percent: 54.38 %
Brady Statistic AS VP Percent: 0.03 %
Brady Statistic AS VS Percent: 45.43 %
Brady Statistic RA Percent Paced: 53.59 %
Brady Statistic RV Percent Paced: 0.24 %
Date Time Interrogation Session: 20220817002204
HighPow Impedance: 37 Ohm
HighPow Impedance: 53 Ohm
Implantable Pulse Generator Implant Date: 20131120
Lead Channel Impedance Value: 266 Ohm
Lead Channel Impedance Value: 380 Ohm
Lead Channel Impedance Value: 380 Ohm
Lead Channel Pacing Threshold Amplitude: 0.875 V
Lead Channel Pacing Threshold Amplitude: 0.875 V
Lead Channel Pacing Threshold Pulse Width: 0.4 ms
Lead Channel Pacing Threshold Pulse Width: 0.4 ms
Lead Channel Sensing Intrinsic Amplitude: 0.625 mV
Lead Channel Sensing Intrinsic Amplitude: 0.625 mV
Lead Channel Sensing Intrinsic Amplitude: 3.5 mV
Lead Channel Sensing Intrinsic Amplitude: 3.5 mV
Lead Channel Setting Pacing Amplitude: 1.75 V
Lead Channel Setting Pacing Amplitude: 2 V
Lead Channel Setting Pacing Pulse Width: 0.4 ms
Lead Channel Setting Sensing Sensitivity: 0.3 mV

## 2021-03-07 DIAGNOSIS — J449 Chronic obstructive pulmonary disease, unspecified: Secondary | ICD-10-CM | POA: Diagnosis not present

## 2021-03-07 DIAGNOSIS — I10 Essential (primary) hypertension: Secondary | ICD-10-CM | POA: Diagnosis not present

## 2021-03-07 DIAGNOSIS — R531 Weakness: Secondary | ICD-10-CM | POA: Diagnosis not present

## 2021-03-07 DIAGNOSIS — G4733 Obstructive sleep apnea (adult) (pediatric): Secondary | ICD-10-CM | POA: Diagnosis not present

## 2021-03-07 DIAGNOSIS — I5089 Other heart failure: Secondary | ICD-10-CM | POA: Diagnosis not present

## 2021-03-08 NOTE — Progress Notes (Signed)
Subjective: [name] is a pleasant 80 y.o. female patient seen today for diabetic foot care. She is seen for plantar calluses of both feet and painful thick toenails that are difficult to trim. Pain interferes with ambulation. Aggravating factors include wearing enclosed shoe gear. Pain is relieved with periodic professional debridement.  She states her blood glucose was 88 mg/dl today. Last A1c was 5.0%. Her daughter, Stephaie Dardis, is present during today's visit. They voice no new pedal concerns on today's visit.  PCP is Henson, Vickie L, NP-C. Last visit was: 10/16/2020.  Allergies  Allergen Reactions   Benazepril Hcl Other (See Comments)    Unknown   Lotrel [Amlodipine Besy-Benazepril Hcl] Other (See Comments)    Hypotension    Talwin [Pentazocine] Other (See Comments)    Hallucinations      Objective: Physical Exam  General: [name] is a pleasant 80 y.o. African American female, WD, WN in NAD. AAO x 3.   Vascular:  Capillary fill time to digits <3 seconds b/l lower extremities. Faintly palpable DP pulse(s) b/l lower extremities. Faintly palpable PT pulse(s) b/l lower extremities. Pedal hair absent. Lower extremity skin temperature gradient within normal limits. No edema noted b/l lower extremities.  Dermatological:  Skin warm and supple b/l lower extremities. No open wounds b/l lower extremities. No interdigital macerations b/l lower extremities. Toenails 1-5 b/l elongated, discolored, dystrophic, thickened, crumbly with subungual debris and tenderness to dorsal palpation. Hyperkeratotic lesion(s) submet head 1 left foot, submet head 1 right foot, submet head 5 left foot, and submet head 5 right foot.  No erythema, no edema, no drainage, no fluctuance.  Musculoskeletal:  Normal muscle strength 5/5 to all lower extremity muscle groups bilaterally. No pain crepitus or joint limitation noted with ROM b/l lower extremities. Hammertoe(s) noted to the 2-5 bilaterally.  Wearing sandals on today's visit. Fit adequate and no signs of skin irritation. Ambulates independently without assistive aids.  Neurological:  Protective sensation intact 5/5 intact bilaterally with 10g monofilament b/l. Vibratory sensation intact b/l.  Assessment and Plan:  1. Pain due to onychomycosis of toenails of both feet   2. Callus   3. Diabetes mellitus type 2, diet-controlled ([place])      -Examined patient. -Continue diabetic foot care principles: inspect feet daily, monitor glucose as recommended by PCP and/or Endocrinologist, and follow prescribed diet per PCP, Endocrinologist and/or dietician. -Patient to continue soft, supportive shoe gear daily. -Toenails 1-5 b/l were debrided in length and girth with sterile nail nippers and dremel without iatrogenic bleeding.  -Callus(es) submet head 1 left foot, submet head 1 right foot, submet head 5 left foot, and submet head 5 right foot pared utilizing sterile scalpel blade without complication or incident. Total number debrided =4. -Patient to report any pedal injuries to medical professional immediately. -Patient/POA to call should there be question/concern in the interim.  Return in about 3 months (around 06/03/2021).  Marzetta Board, DPM

## 2021-03-10 ENCOUNTER — Other Ambulatory Visit: Payer: Self-pay

## 2021-03-10 ENCOUNTER — Ambulatory Visit
Admission: RE | Admit: 2021-03-10 | Discharge: 2021-03-10 | Disposition: A | Discharge status: Home / Self Care | Payer: Medicare PPO | Source: Ambulatory Visit | Attending: Family Medicine | Admitting: Family Medicine

## 2021-03-10 DIAGNOSIS — N189 Chronic kidney disease, unspecified: Secondary | ICD-10-CM | POA: Diagnosis not present

## 2021-03-10 DIAGNOSIS — Z1231 Encounter for screening mammogram for malignant neoplasm of breast: Secondary | ICD-10-CM | POA: Diagnosis not present

## 2021-03-10 DIAGNOSIS — N1832 Chronic kidney disease, stage 3b: Secondary | ICD-10-CM | POA: Diagnosis not present

## 2021-03-18 DIAGNOSIS — I10 Essential (primary) hypertension: Secondary | ICD-10-CM | POA: Diagnosis not present

## 2021-03-18 DIAGNOSIS — I5089 Other heart failure: Secondary | ICD-10-CM | POA: Diagnosis not present

## 2021-03-18 DIAGNOSIS — J449 Chronic obstructive pulmonary disease, unspecified: Secondary | ICD-10-CM | POA: Diagnosis not present

## 2021-03-18 DIAGNOSIS — G4733 Obstructive sleep apnea (adult) (pediatric): Secondary | ICD-10-CM | POA: Diagnosis not present

## 2021-03-20 DIAGNOSIS — D631 Anemia in chronic kidney disease: Secondary | ICD-10-CM | POA: Diagnosis not present

## 2021-03-20 DIAGNOSIS — N2581 Secondary hyperparathyroidism of renal origin: Secondary | ICD-10-CM | POA: Diagnosis not present

## 2021-03-20 DIAGNOSIS — I129 Hypertensive chronic kidney disease with stage 1 through stage 4 chronic kidney disease, or unspecified chronic kidney disease: Secondary | ICD-10-CM | POA: Diagnosis not present

## 2021-03-20 DIAGNOSIS — N1832 Chronic kidney disease, stage 3b: Secondary | ICD-10-CM | POA: Diagnosis not present

## 2021-03-20 DIAGNOSIS — R609 Edema, unspecified: Secondary | ICD-10-CM | POA: Diagnosis not present

## 2021-03-20 DIAGNOSIS — I509 Heart failure, unspecified: Secondary | ICD-10-CM | POA: Diagnosis not present

## 2021-03-25 NOTE — Progress Notes (Signed)
Remote ICD transmission.   

## 2021-04-07 ENCOUNTER — Other Ambulatory Visit: Payer: Self-pay

## 2021-04-07 DIAGNOSIS — I10 Essential (primary) hypertension: Secondary | ICD-10-CM | POA: Diagnosis not present

## 2021-04-07 DIAGNOSIS — I5089 Other heart failure: Secondary | ICD-10-CM | POA: Diagnosis not present

## 2021-04-07 DIAGNOSIS — J449 Chronic obstructive pulmonary disease, unspecified: Secondary | ICD-10-CM | POA: Diagnosis not present

## 2021-04-07 DIAGNOSIS — G4733 Obstructive sleep apnea (adult) (pediatric): Secondary | ICD-10-CM | POA: Diagnosis not present

## 2021-04-07 MED ORDER — TORSEMIDE 20 MG PO TABS: 60.0000 mg | ORAL_TABLET | ORAL | 3 refills | Status: DC | PRN

## 2021-04-09 ENCOUNTER — Other Ambulatory Visit: Payer: Self-pay | Admitting: Cardiovascular Disease

## 2021-04-10 ENCOUNTER — Telehealth: Payer: Self-pay | Admitting: Cardiovascular Disease

## 2021-04-10 ENCOUNTER — Other Ambulatory Visit: Payer: Self-pay | Admitting: Family Medicine

## 2021-04-10 MED ORDER — TORSEMIDE 20 MG PO TABS: 60.0000 mg | ORAL_TABLET | Freq: Every day | ORAL | 3 refills | Status: DC | PRN

## 2021-04-10 NOTE — Telephone Encounter (Signed)
Spoke with rep from [facility] who sent torsemide dose was missing frequency and will require a new prescription. Per chart review, pt is to take 60 mg daily as needed. New rx sent to the requesting pharmacy.

## 2021-04-10 NOTE — Telephone Encounter (Signed)
Follow Up:    She need the dosing frequency for the patient's Torsemide.

## 2021-04-14 ENCOUNTER — Ambulatory Visit: Payer: Medicare PPO | Admitting: Family Medicine

## 2021-04-15 ENCOUNTER — Other Ambulatory Visit: Payer: Self-pay | Admitting: Family Medicine

## 2021-04-15 DIAGNOSIS — N3941 Urge incontinence: Secondary | ICD-10-CM

## 2021-04-15 NOTE — Progress Notes (Signed)
[name] is a 80 y.o. female who presents for annual wellness visit, CPE and follow-up on chronic medical conditions.  She has the following concerns:  Her daughter [name] is with her today.  Complains of issues emptying her colon.  States her GI prescribed Linzess and Miralax.  She sees Dr. Havery Moros.  States this has been ongoing for years. She also complains of intermittent right lower abdominal pain that is worse when getting out of bed in the morning.  States she has been seen for this in the past and was advised that she was having musculoskeletal issues.  She is not currently taking anything for her symptoms.  Diabetes controlled with diet.  OSA on CPAP-reports using it nightly and doing well.  She is followed by cardiology for CHF and cardiomyopathy.  Denies any issues with edema.  She is wearing compression socks today.  She is followed by pulmonology for COPD.  She wears oxygen at night only now.  Denies any issues with breathing.  She sees her nephrologist every 6 months for renal failure.  Immunization History  Administered Date(s) Administered   Fluad Quad(high Dose 65+) 04/04/2019, 04/15/2020   Influenza, High Dose Seasonal PF 08/04/2016, 05/02/2017, 04/06/2018   Influenza-Unspecified 05/23/2013, 08/25/2013, 06/22/2014, 06/22/2014, 05/22/2015   PFIZER(Purple Top)SARS-COV-2 Vaccination 09/12/2019, 10/03/2019, 04/30/2020   Pneumococcal Conjugate-13 06/22/2014   Pneumococcal-Unspecified 01/12/2008   Tdap 01/12/2008   Zoster, Live 07/21/2011, 12/08/2011   Last Pap smear: years ago Last mammogram: 03/10/21 Last colonoscopy: 03/29/2018 Last DEXA: 01/13/2010 Dentist: Dr. Burt Ek Ophtho: Dr. Katy Fitch Exercise: walks down apartment hallway for 5 minutes and outside some  Other doctors caring for patient include: Dr. Elisha Ponder- podiatry Dr. [name]- Cardiology Dr. Halford Chessman- Pulmonology Dr. Havery Moros- GI Dr.Bhandari- Kidney Dr.[facility]  Depression  screen:  See questionnaire below.  Depression screen [facility] 2/9 04/16/2021 12/25/2020 10/16/2020 04/10/2020 04/08/2020  Decreased Interest 0 0 0 0 0  Down, Depressed, Hopeless 0 0 0 0 0  PHQ - 2 Score 0 0 0 0 0    Fall Risk Screen: see questionnaire below. Fall Risk  04/16/2021 02/26/2021 12/25/2020 10/29/2020 10/16/2020  Falls in the past year? 0 0 0 0 0  Number falls in past yr: 0 - - - 0  Injury with Fall? 0 - - - 0  Risk for fall due to : Impaired mobility;Impaired vision - - - -  Follow up Falls evaluation completed - - - Falls evaluation completed    ADL screen:  See questionnaire below Functional Status Survey: Is the patient deaf or have difficulty hearing?: No Does the patient have difficulty seeing, even when wearing glasses/contacts?: No Does the patient have difficulty concentrating, remembering, or making decisions?: No Does the patient have difficulty walking or climbing stairs?: No Does the patient have difficulty dressing or bathing?: No Does the patient have difficulty doing errands alone such as visiting a doctor's office or shopping?: No   End of Life Discussion:  Patient does not have a living will and medical power of attorney. HC POA is her daughter is [name].   Review of Systems Constitutional: -fever, -chills, -sweats, -unexpected weight change, -anorexia, -fatigue Allergy: -sneezing, -itching, -congestion Dermatology: denies changing moles, rash, lumps, new worrisome lesions ENT: -runny nose, -ear pain, -sore throat, -hoarseness, -sinus pain, -teeth pain, -tinnitus, -hearing loss, -epistaxis Cardiology:  -chest pain, -palpitations, -edema, -orthopnea, -paroxysmal nocturnal dyspnea Respiratory: -cough, -shortness of breath, -dyspnea on exertion, -wheezing, -hemoptysis Gastroenterology: + Intermittent abdominal pain, -nausea, -vomiting, -diarrhea, +constipation, -blood in  stool, -changes in bowel movement, -dysphagia Hematology: -bleeding or bruising  problems Musculoskeletal: -arthralgias, -myalgias, -joint swelling, -back pain, -neck pain, -cramping, -gait changes Ophthalmology: -vision changes, -eye redness, -itching, -discharge Urology: -dysuria, -difficulty urinating, -hematuria, -urinary frequency, -urgency, -incontinence Neurology: -headache, -weakness, -tingling, -numbness, -speech abnormality, -memory loss, -falls, -dizziness Psychology:  -depressed mood, -agitation, -sleep problems    PHYSICAL EXAM:  BP 110/62 (BP Location: Right Arm, Patient Position: Sitting)   Pulse 65   Ht 5' (1.524 m)   Wt 163 lb 9.6 oz (74.2 kg)   SpO2 92%   BMI 31.95 kg/m   General Appearance: Alert, cooperative, no distress, appears stated age Head: Normocephalic, without obvious abnormality, atraumatic Eyes: PERRL, conjunctiva/corneas clear, EOM's intact Ears: Normal TM's and external ear canals Neck: Supple, no lymphadenopathy; thyroid: no enlargement/tenderness/nodules Back: Spine nontender, no curvature, ROM normal, no CVA tenderness Lungs: Clear to auscultation bilaterally without wheezes, rales or ronchi; respirations unlabored Chest Wall: No tenderness or deformity Heart: Regular rate and rhythm, no murmur, rub or gallop Abdomen: Soft, non-tender, nondistended, normoactive bowel sounds, no masses, no hepatosplenomegaly Extremities: No clubbing, cyanosis or edema Pulses: 2+ and symmetric all extremities Skin: Skin color, texture, turgor normal, no rashes or lesions Lymph nodes: Cervical, supraclavicular nodes normal Neurologic: CNII-XII intact, normal strength, sensation and gait Psych: Normal mood, affect, hygiene and grooming.  ASSESSMENT/PLAN: Medicare annual wellness visit, subsequent -She presents today for Medicare wellness visit.  Her daughter [name] is with her.  She denies any recent falls, depression, difficulties with ADLs or memory.  She is followed by several specialist.  Advanced directives discussed.  She is a DNR and  this was also discussed.  Her daughter [name] is her healthcare power of attorney.  Type 2 diabetes, controlled, with peripheral neuropathy ([name]) - Plan: CBC with Differential/Platelet, Comprehensive metabolic panel, TSH, T4, free, Lipid panel, Hemoglobin A1c -Follow-up pending A1c results.  Continue with healthy diet  Urge incontinence of urine - Plan: mirabegron ER (MYRBETRIQ) 50 MG TB24 tablet -Medication works well for her.  She uses it on days that she goes out of the house.  OSA on CPAP -Doing well on CPAP.  Followed by Dr. Halford Chessman  Chronic combined systolic (congestive) and diastolic (congestive) heart failure ([place]) - Plan: Comprehensive metabolic panel -No sign of fluid overload today.  Followed by cardiology.  Routine general medical examination at a health care facility - Plan: CBC with Differential/Platelet, Comprehensive metabolic panel, TSH, T4, free, Lipid panel -Preventive health care reviewed.  Immunizations reviewed.  She is taking good care of herself.  In good spirits.  Hypercholesterolemia - Plan: Lipid panel -Follow-up pending lipid panel results  Intermittent right lower quadrant abdominal pain -This has been ongoing for several months/years and is not worsening.  Discussed trying Tylenol or heating pad.  We also discussed the possibility of getting an ultrasound and we will hold off for now.  She will also discuss this with her GI and she plans to see Dr. Havery Moros soon also due to constipation issues.    Discussed monthly self breast exams and yearly mammograms; at least 30 minutes of aerobic activity at least 5 days/week and weight-bearing exercise 2x/week; proper sunscreen use reviewed; healthy diet, including goals of calcium and vitamin D intake and alcohol recommendations (less than or equal to 1 drink/day) reviewed; regular seatbelt use; changing batteries in smoke detectors.  Immunization recommendations discussed.  Colonoscopy recommendations  reviewed   Medicare Attestation I have personally reviewed: The patient's medical and social history  Their  use of alcohol, tobacco or illicit drugs Their current medications and supplements The patient's functional ability including ADLs,fall risks, home safety risks, cognitive, and hearing and visual impairment Diet and physical activities Evidence for depression or mood disorders  The patient's weight, height, and BMI have been recorded in the chart.  I have made referrals, counseling, and provided education to the patient based on review of the above and I have provided the patient with a written personalized care plan for preventive services.     Harland Dingwall, NP-C   04/16/2021

## 2021-04-16 ENCOUNTER — Other Ambulatory Visit: Payer: Self-pay

## 2021-04-16 ENCOUNTER — Ambulatory Visit (INDEPENDENT_AMBULATORY_CARE_PROVIDER_SITE_OTHER): Payer: Medicare PPO | Admitting: Family Medicine

## 2021-04-16 ENCOUNTER — Encounter: Payer: Self-pay | Admitting: Family Medicine

## 2021-04-16 ENCOUNTER — Encounter (HOSPITAL_COMMUNITY): Payer: Self-pay | Admitting: Internal Medicine

## 2021-04-16 ENCOUNTER — Telehealth: Payer: Self-pay | Admitting: Gastroenterology

## 2021-04-16 ENCOUNTER — Ambulatory Visit (HOSPITAL_COMMUNITY)
Admission: RE | Admit: 2021-04-16 | Discharge: 2021-04-16 | Disposition: A | Discharge status: Home / Self Care | Payer: Medicare PPO | Source: Ambulatory Visit | Attending: Internal Medicine | Admitting: Internal Medicine

## 2021-04-16 VITALS — BP 110/62 | HR 65 | Ht 60.0 in | Wt 163.6 lb

## 2021-04-16 VITALS — BP 104/60 | HR 76 | Wt 167.2 lb

## 2021-04-16 DIAGNOSIS — Z8249 Family history of ischemic heart disease and other diseases of the circulatory system: Secondary | ICD-10-CM | POA: Diagnosis not present

## 2021-04-16 DIAGNOSIS — N3941 Urge incontinence: Secondary | ICD-10-CM

## 2021-04-16 DIAGNOSIS — G4733 Obstructive sleep apnea (adult) (pediatric): Secondary | ICD-10-CM | POA: Insufficient documentation

## 2021-04-16 DIAGNOSIS — J9612 Chronic respiratory failure with hypercapnia: Secondary | ICD-10-CM | POA: Diagnosis not present

## 2021-04-16 DIAGNOSIS — E1136 Type 2 diabetes mellitus with diabetic cataract: Secondary | ICD-10-CM | POA: Diagnosis not present

## 2021-04-16 DIAGNOSIS — Z9989 Dependence on other enabling machines and devices: Secondary | ICD-10-CM | POA: Diagnosis not present

## 2021-04-16 DIAGNOSIS — I428 Other cardiomyopathies: Secondary | ICD-10-CM | POA: Insufficient documentation

## 2021-04-16 DIAGNOSIS — N184 Chronic kidney disease, stage 4 (severe): Secondary | ICD-10-CM | POA: Diagnosis not present

## 2021-04-16 DIAGNOSIS — R140 Abdominal distension (gaseous): Secondary | ICD-10-CM | POA: Insufficient documentation

## 2021-04-16 DIAGNOSIS — E1122 Type 2 diabetes mellitus with diabetic chronic kidney disease: Secondary | ICD-10-CM | POA: Insufficient documentation

## 2021-04-16 DIAGNOSIS — I130 Hypertensive heart and chronic kidney disease with heart failure and stage 1 through stage 4 chronic kidney disease, or unspecified chronic kidney disease: Secondary | ICD-10-CM | POA: Diagnosis not present

## 2021-04-16 DIAGNOSIS — E1142 Type 2 diabetes mellitus with diabetic polyneuropathy: Secondary | ICD-10-CM

## 2021-04-16 DIAGNOSIS — R1031 Right lower quadrant pain: Secondary | ICD-10-CM | POA: Diagnosis not present

## 2021-04-16 DIAGNOSIS — R899 Unspecified abnormal finding in specimens from other organs, systems and tissues: Secondary | ICD-10-CM | POA: Diagnosis not present

## 2021-04-16 DIAGNOSIS — I2510 Atherosclerotic heart disease of native coronary artery without angina pectoris: Secondary | ICD-10-CM | POA: Diagnosis not present

## 2021-04-16 DIAGNOSIS — Z87891 Personal history of nicotine dependence: Secondary | ICD-10-CM | POA: Insufficient documentation

## 2021-04-16 DIAGNOSIS — I5042 Chronic combined systolic (congestive) and diastolic (congestive) heart failure: Secondary | ICD-10-CM

## 2021-04-16 DIAGNOSIS — Z0000 Encounter for general adult medical examination without abnormal findings: Secondary | ICD-10-CM | POA: Diagnosis not present

## 2021-04-16 DIAGNOSIS — E7800 Pure hypercholesterolemia, unspecified: Secondary | ICD-10-CM | POA: Diagnosis not present

## 2021-04-16 DIAGNOSIS — Z9981 Dependence on supplemental oxygen: Secondary | ICD-10-CM | POA: Insufficient documentation

## 2021-04-16 DIAGNOSIS — I10 Essential (primary) hypertension: Secondary | ICD-10-CM | POA: Diagnosis not present

## 2021-04-16 DIAGNOSIS — Z79899 Other long term (current) drug therapy: Secondary | ICD-10-CM | POA: Diagnosis not present

## 2021-04-16 DIAGNOSIS — Z23 Encounter for immunization: Secondary | ICD-10-CM

## 2021-04-16 DIAGNOSIS — Z7901 Long term (current) use of anticoagulants: Secondary | ICD-10-CM | POA: Diagnosis not present

## 2021-04-16 DIAGNOSIS — J9611 Chronic respiratory failure with hypoxia: Secondary | ICD-10-CM | POA: Insufficient documentation

## 2021-04-16 DIAGNOSIS — I272 Pulmonary hypertension, unspecified: Secondary | ICD-10-CM

## 2021-04-16 DIAGNOSIS — Z7982 Long term (current) use of aspirin: Secondary | ICD-10-CM | POA: Insufficient documentation

## 2021-04-16 DIAGNOSIS — I5023 Acute on chronic systolic (congestive) heart failure: Secondary | ICD-10-CM | POA: Diagnosis not present

## 2021-04-16 IMAGING — US US RENAL
1 series · 14 of 25 positions shown · non-contrast
Comparison: 12/22/2017

CLINICAL DATA: Acute kidney injury.  CKD.

EXAM:
RENAL / URINARY TRACT ULTRASOUND COMPLETE

[Series 1: us renal · 14 of 30 slices shown]
[im 1/30]
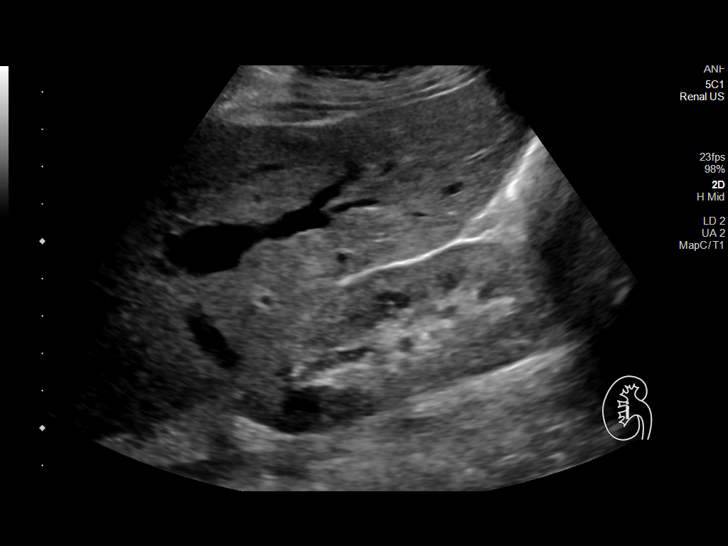
[im 3/30]
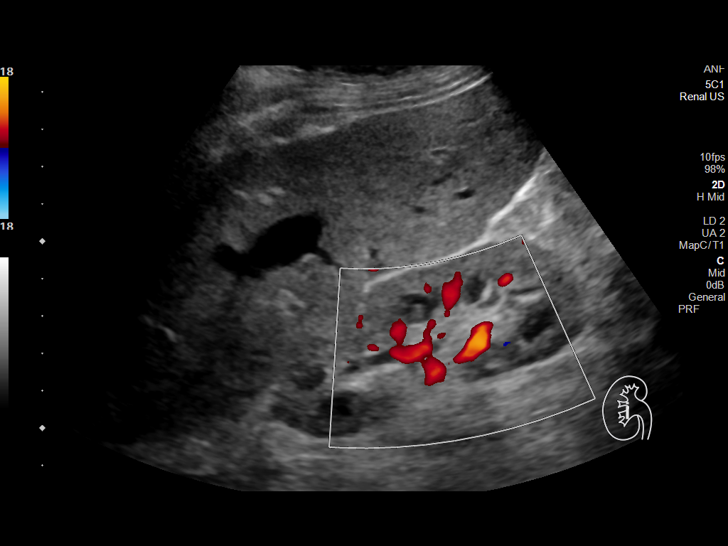
[im 5/30]
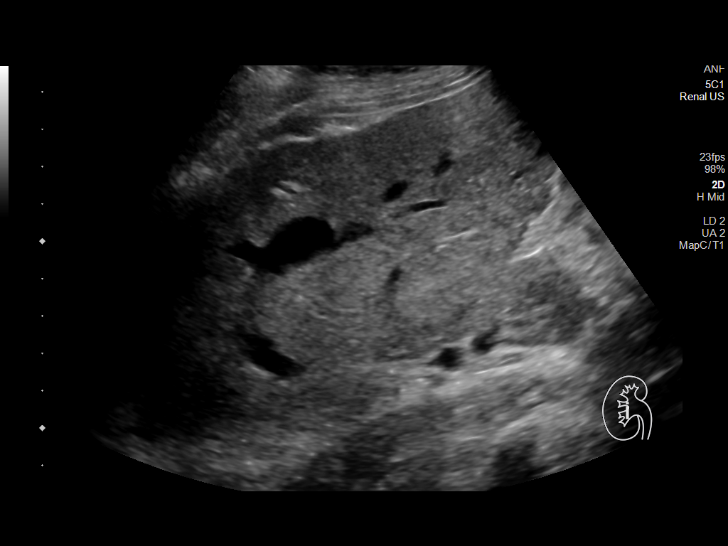
[im 8/30]
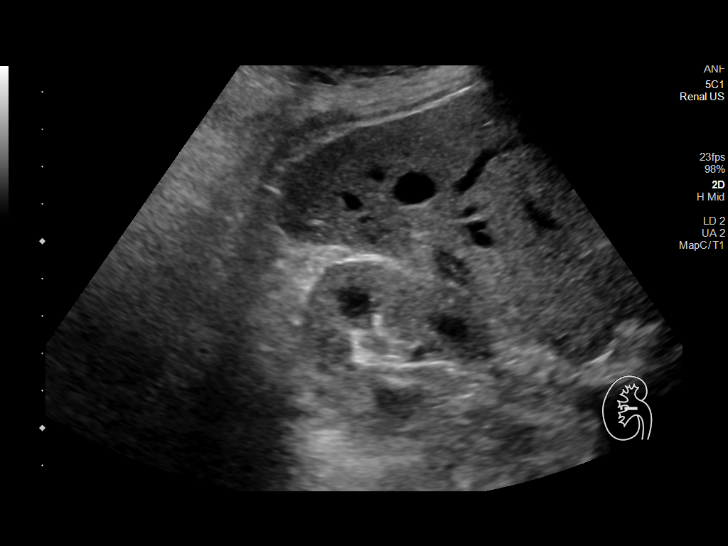
[im 10/30]
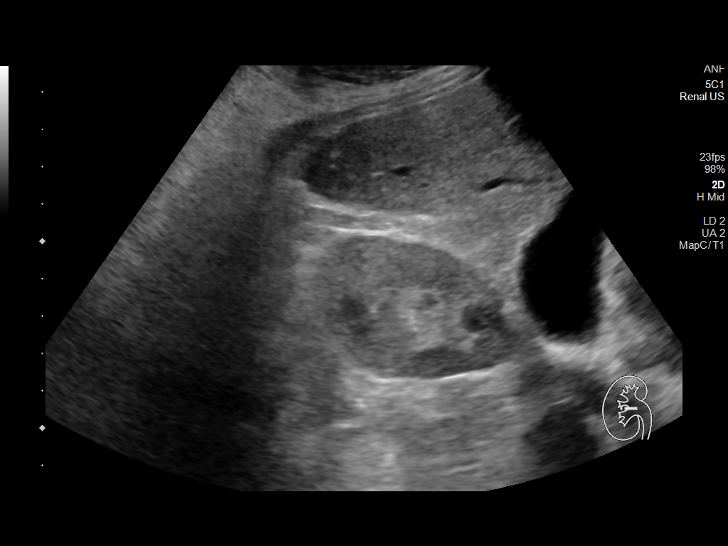
[im 11/30]
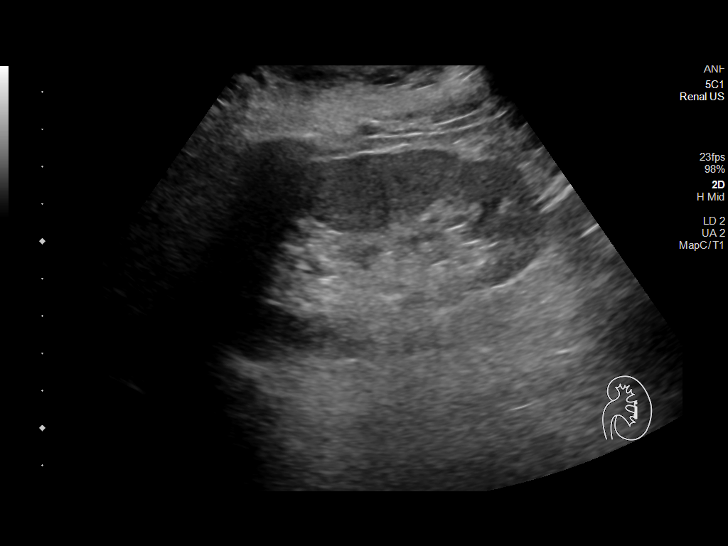
[im 14/30]
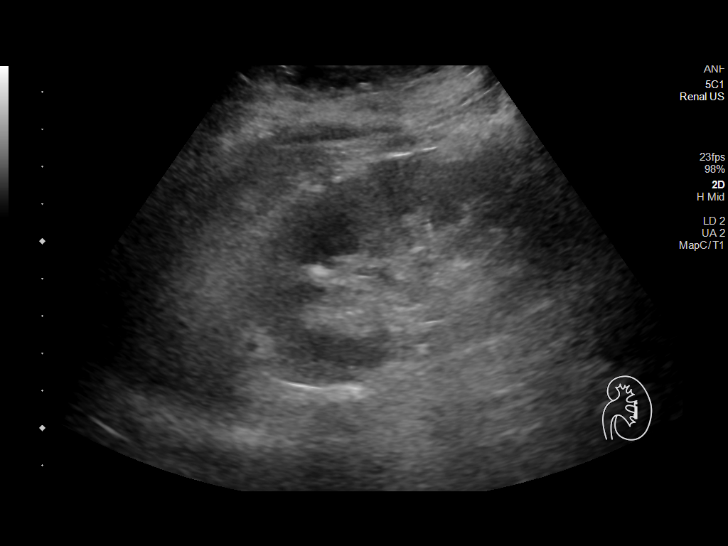
[im 16/30]
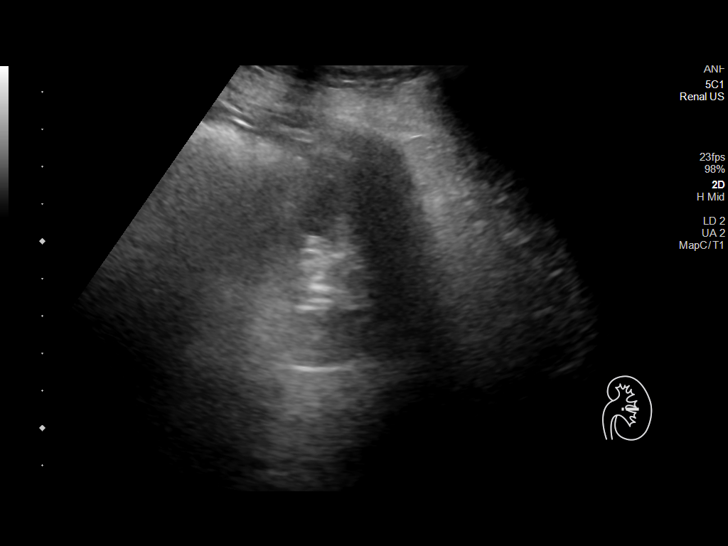
[im 19/30]
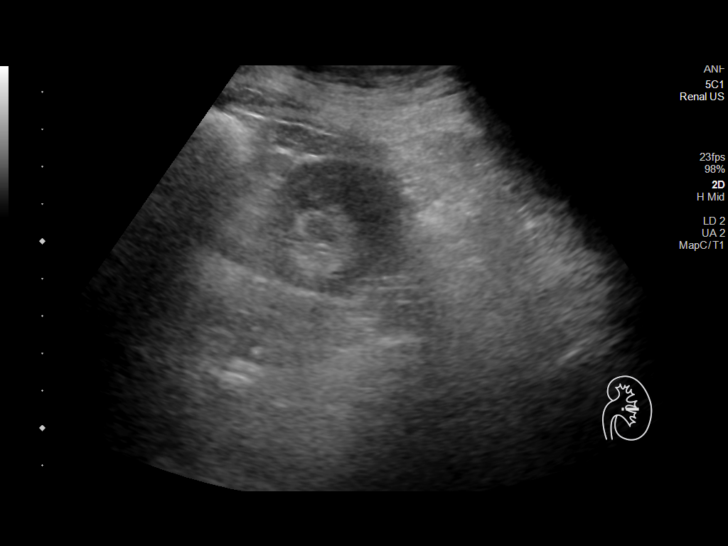
[im 20/30]
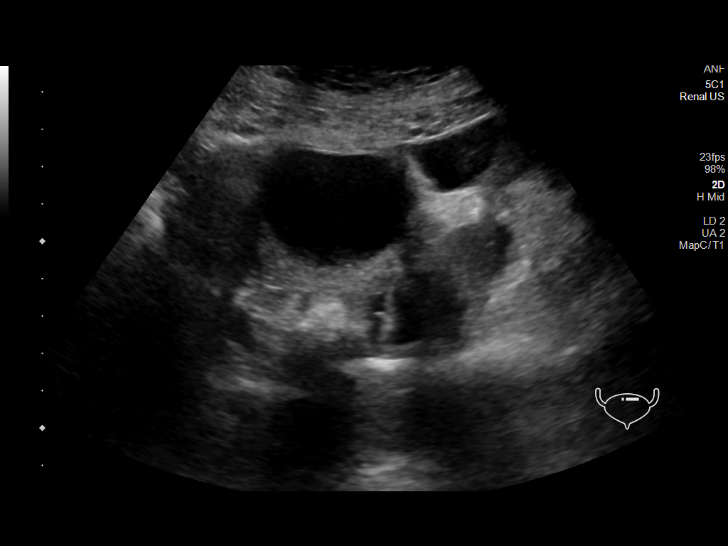
[im 22/30]
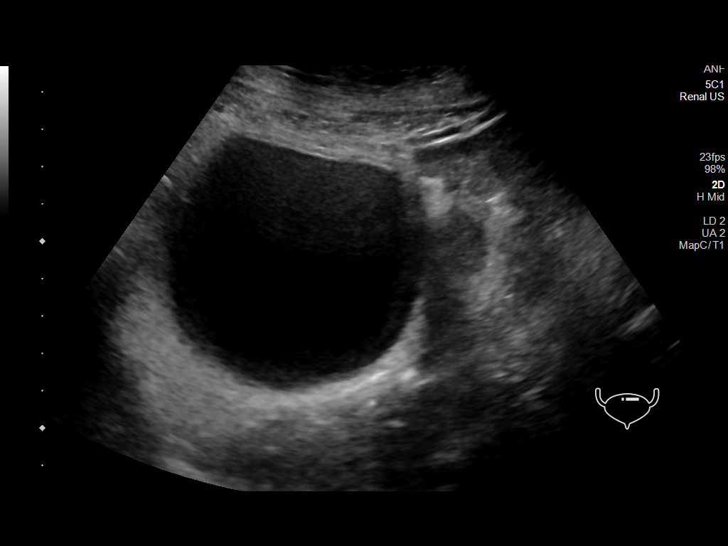
[im 25/30]
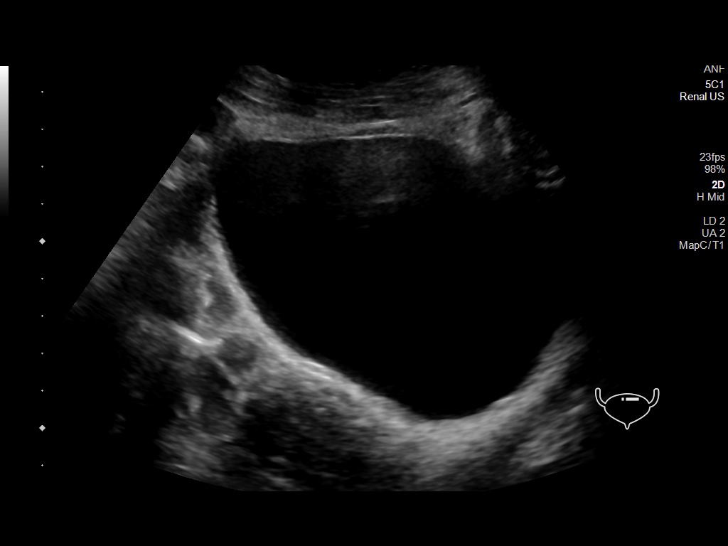
[im 27/30]
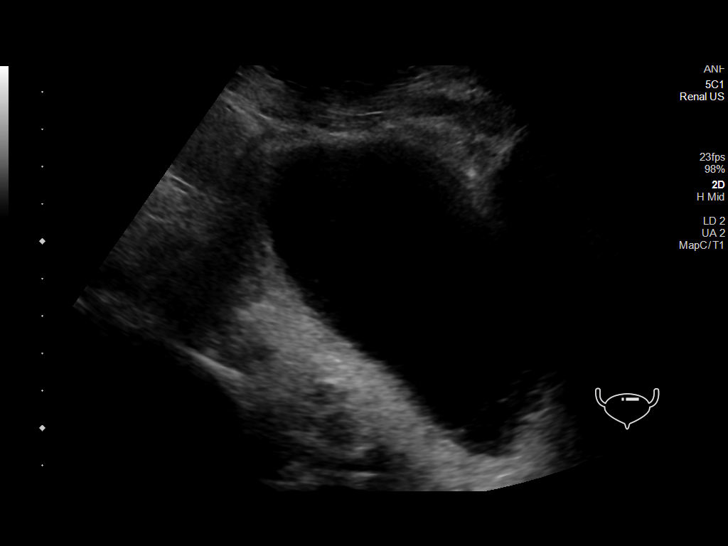
[im 30/30]
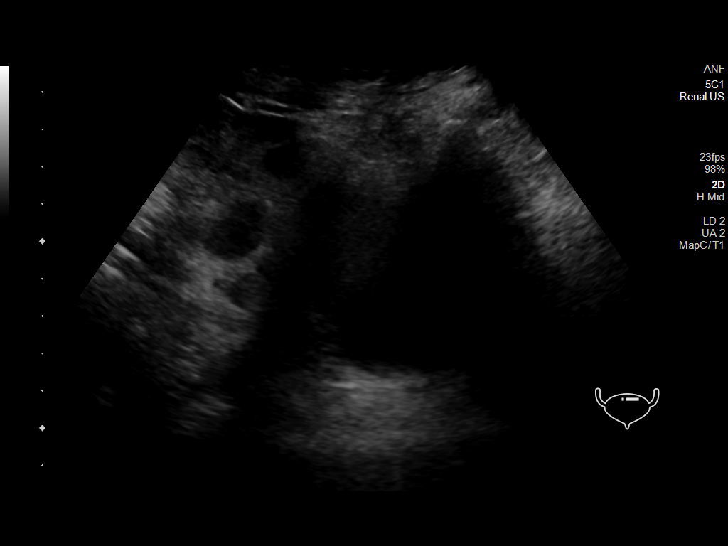

[14 of 25 positions shown; findings below may reference images not displayed]

FINDINGS: Right Kidney:

Renal measurements: 9.2 x 3.6 x 4.8 cm = volume: 83 mL .
Echogenicity within normal limits. No mass or hydronephrosis
visualized.

Left Kidney:

Renal measurements: 9.2 x 4.8 x 4.5 cm = volume: 105 mL.
Echogenicity within normal limits. No mass or hydronephrosis
visualized.

Bladder:

Appears normal for degree of bladder distention.

Other:

None.
IMPRESSION: Unremarkable ultrasound of the kidneys and bladder. No evidence of
obstructive uropathy.

## 2021-04-16 MED ORDER — MIRABEGRON ER 50 MG PO TB24: ORAL_TABLET | ORAL | 1 refills | Status: DC

## 2021-04-16 NOTE — Patient Instructions (Signed)
Your physician recommends that you schedule a follow-up appointment in: 6 months with an echocardiogram (March 2023) **PLEASE CALL OUR OFFICE IN February TO SCHEDULE THIS APPOINTMENT**  If you have any questions or concerns before your next appointment please send [place] a message through [place] or call our office at [phone].    TO LEAVE A MESSAGE FOR THE NURSE SELECT OPTION 2, PLEASE LEAVE A MESSAGE INCLUDING: YOUR NAME DATE OF BIRTH CALL BACK NUMBER REASON FOR CALL**this is important as we prioritize the call backs  YOU WILL RECEIVE A CALL BACK THE SAME DAY AS LONG AS YOU CALL BEFORE 4:00 PM  At the [facility], you and your health needs are our priority. As part of our continuing mission to provide you with exceptional heart care, we have created designated Provider Care Teams. These Care Teams include your primary Cardiologist (physician) and Advanced Practice Providers (APPs- Physician Assistants and Nurse Practitioners) who all work together to provide you with the care you need, when you need it.   You may see any of the following providers on your designated Care Team at your next follow up: Dr Glori Bickers Dr Loralie Champagne Dr Patrice Paradise, NP Lyda Jester, [place] Ginnie Smart Audry Riles, PharmD   Please be sure to bring in all your medications bottles to every appointment.

## 2021-04-16 NOTE — Patient Instructions (Signed)
  [name] , Thank you for taking time to come for your Medicare Wellness Visit. I appreciate your ongoing commitment to your health goals. Please review the following plan we discussed and let me know if I can assist you in the future.   These are the goals we discussed:  Goals      THN-Track and Manage Symptoms     Barriers: Health Behaviors Knowledge  Timeframe:  Long-Range Goal Priority:  High Start Date:  07/24/20                           Expected End Date: 07/19/21                      Follow Up Date 03/19/21        Reiterated the following:   - follow rescue plan if symptoms flare-up - know when to call the doctor - track symptoms and what helps feel better or worse    Why is this important?   You will be able to handle your symptoms better if you keep track of them.  Making some simple changes to your lifestyle will help.  Eating healthy is one thing you can do to take good care of yourself.    Notes:  12/25/20 Patient weight 150 lbs this am. Patient has parameters of 149-151 lbs.  Knows when to take extra lasix.   Discussed Heart Failure Management Please weight daily or as ordered by your doctor Report to your doctor weight gain of 2 -3 pounds in a day or 5 pounds in a week Limit salt intake Monitor for shortness of breath, swelling of feet, ankles or abdomen and weight gain 02/26/21 Patient last weight 150 lbs. Patient weighing daily.  Patient has used as needed lasix dose about 2 a month after eating out.  Heart Failure Management reiterated. Keep up the great work!!        This is a list of the screening recommended for you and due dates:  Health Maintenance  Topic Date Due   Zoster (Shingles) Vaccine (1 of 2) Never done   Tetanus Vaccine  01/11/2018   Complete foot exam   04/03/2020   Urine Protein Check  04/03/2020   COVID-19 Vaccine (4 - Booster for Pfizer series) 07/23/2020   Flu Shot  02/17/2021   Hemoglobin A1C  04/18/2021   Eye exam for diabetics   05/15/2021   DEXA scan (bone density measurement)  Completed   HPV Vaccine  Aged Out

## 2021-04-16 NOTE — Telephone Encounter (Signed)
Inbound call from pt's daughter requesting a call back stating that her mother has a change in her BM. Please advise. Thank you.

## 2021-04-16 NOTE — Telephone Encounter (Signed)
Thanks for the update [place].  Recommend the patient try taking her Linzess twice daily and see if that helps to provide any additional benefit over the next week or so.  If she is due for bowel purge you can give her instructions on a MiraLAX bowel prep and see if that will help her feel better.  Thanks

## 2021-04-16 NOTE — Progress Notes (Addendum)
PCP: Harland Dingwall NP-C  HF Cardiologist: Dr Haroldine Laws Cardiology: Dr Loleta Chance Nephrology: Dr Resa Miner   HPI:  Ms. Bouska is an 80 y/o w/longstanding chronic systolic heart failure/ NICM and pulmonary hypertension.   Per records, her systolic HF dates back to 2006. Mild nonobstructive CAD by LHC in 2006 and again in 2015. She is s/p Metronic ICD, also w/ restrictive lung disease, OSA on CPAP, essential hypertension, CKD, HLD and T2DM. Her daughter is a Therapist, sports for [employer].    Echo 2016 with LVEF 45% w/ mild AS, normal RV but severe pulmonary HTN, PASP 70. She has been followed by Dr. Halford Chessman for restrictive lung disease. Previous PFTs (2018) showed no obstruction but fairly significant restriction (65% of predicted) and markedly reduced DLCO (39%, corrected for alveolar volume).     Admitted 7/21 for acute on chronic hypoxic respiratory failure and acute on chronic systolic heart failure w/ volume overload. Hypoxic in the 80s on arrival and required NRB. Echo LVEF 45-50% w/ global hypokinesis. RV severely enlarged w/ moderately elevated pulmonary artery systolic pressure, 14.4 mmHg, + mod-severe TR. RHC showed severe PAH w/ normal left-sided pressures. CO/CI normal.  Admitted 8/18 with A/C Systolic HF. Diuresed with IV lasix and transitioned to torsemide 60 mg daily. Creatinine peaked at 3.42. Renal US was negative. Sildenafil and amlodipine both stopped due to hypotension. Discharged 03/07/2020.   Last seen for f/u 02/22. Volume status stable. Echo repeated in 03/22. LVEF 55-60%, RV fxn okay, RVSP 73 mmHg, trivial MR, moderate TR. Subsequently had RHC 04/22 with moderate PAH (mean PAP 33 mmHg), normal filling pressures and CI 2.66.  Here today for f/u. Typically takes between 20-60 mg Torsemide as needed when weight above 150 lb. Weight up to 158 lb 2 days ago and took 60 torsemide, took 40 yesterday and weight 154 lb today. Denies any significant dyspnea or leg edema. No orthopnea or PND. Notes  some abdominal bloating but feels this may be due to constipation. Had been on supplemental 02 continuously at one point but now only requiring at night. Still lives independently.    PFTs 2/22 FEV1 1.22 (91%) FVC 1.52 (87%) FEF 25-75% 1.19 (108%) DLCO 60%  PFTs 10/18: FEV1 1.06 (69%) FVC 1.48 (65%) FEF 25-75% 0.74 (40%) DLCO 7.8 39%  RHC Findings 10/31/2020:  RA = 1 RV = 62/1 PA = 63/14 (33) PCW = 6 Fick cardiac output/index = 4.5/2.7 PVR = 6.0 (Fick)  Ao sat = 95% PA sat = 68%, 68% PAPI = 49   RHC Findings 02/15/20:  RA = 20 RV = 72/27 PA = 79/44 (53) PCW = 18 Fick cardiac output/index = 5.35/2.9 Thermo CO/CI = 4.4/2.4 PVR = 6.5 (Fick) 8.0 (Thermo) Ao sat = 94% PA sat = 64%, 64% SVC sat = 61%   Assessment: 1. Severe PAH with normal left-sided pressures   Plan/Discussion:  There is near equalization or diastolic pressures which can reflect restrictive physiology or elevated R-sided pressure with normal left pressures. Given relatively normal PCWP - restriction felt to be less likely.    ROS: All systems negative except as listed in HPI, PMH and Problem List.  SH:  Social History   Socioeconomic History   Marital status: Widowed    Spouse name: Not on file   Number of children: Not on file   Years of education: Not on file   Highest education level: Not on file  Occupational History   Not on file  Tobacco Use   Smoking status: Former  Types: Cigarettes    Quit date: 03/14/2009    Years since quitting: 12.0   Smokeless tobacco: Never   Tobacco comments:    quit smoking 2010  Vaping Use   Vaping Use: Never used  Substance and Sexual Activity   Alcohol use: No   Drug use: No   Sexual activity: Not on file  Other Topics Concern   Not on file  Social History Narrative   Lives alone.  2 daughters and 1 granddaughter in [place]   Social Determinants of Health   Financial Resource Strain: Not on file  Food Insecurity: Not on file  Transportation  Needs: No Transportation Needs   Lack of Transportation (Medical): No   Lack of Transportation (Non-Medical): No  Physical Activity: Not on file  Stress: Not on file  Social Connections: Not on file  Intimate Partner Violence: Not on file    FH:  Family History  Problem Relation Age of Onset   Colon polyps Daughter    Hypertension Daughter    Hypertension Mother    Stroke Mother    Hypertension Father    Diabetes Brother    Hypertension Brother    Hypertension Daughter    Colon cancer Neg Hx    Esophageal cancer Neg Hx    Rectal cancer Neg Hx    Stomach cancer Neg Hx     Past Medical History:  Diagnosis Date   Anemia    CAD (coronary artery disease)    Cardiac defibrillator in situ    Cataract    bilateral 2017   CHF (congestive heart failure) (HCC)    Chronic systolic heart failure (HCC)    CKD (chronic kidney disease) stage 4, GFR 15-29 ml/min (HCC)    COPD (chronic obstructive pulmonary disease) (HCC)    Dyspnea    HTN (hypertension)    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 Rollene Rotunda in [place]. Per notes she was due for colonoscopy in 12/2016   OSA on CPAP 09/08/2017   Other cardiomyopathies ([place])    Oxygen deficiency    2 liters at night   Pulmonary HTN (HCC)    Sleep apnea    uses c-pap   Stroke [facility])    Type 2 diabetes, controlled, with peripheral neuropathy (HCC)    Urge incontinence of urine     Current Outpatient Medications  Medication Sig Dispense Refill   Alcohol Swabs (DROPSAFE ALCOHOL PREP) 70 % PADS TEST BLOOD SUGAR ONE TIME DAILY 100 each 1   amLODipine (NORVASC) 10 MG tablet Take 10 mg by mouth at bedtime.     aspirin 81 MG tablet Take 81 mg by mouth at bedtime.      atorvastatin (LIPITOR) 40 MG tablet TAKE 1 TABLET EVERY DAY 90 tablet 0   Blood Glucose Calibration (TRUE METRIX LEVEL 1) Low SOLN USE AS DIRECTED WITH GLUCOSE METER 1 each 0   Blood Glucose Monitoring Suppl (TRUE METRIX METER) DEVI Test daily  Pt will need a true  metrix meter E11.9 1 each 0   carvedilol (COREG) 3.125 MG tablet Take 1 tablet (3.125 mg total) by mouth 2 (two) times daily with a meal. 180 tablet 3   cholecalciferol (VITAMIN D3) 25 MCG (1000 UNIT) tablet Take 1,000 Units by mouth daily.     ferrous sulfate 325 (65 FE) MG tablet Take 325 mg by mouth every Monday, Wednesday, and Friday.     ipratropium-albuterol (DUONEB) 0.5-2.5 (3) MG/3ML SOLN INHALE THE CONTENTS OF 1 VIAL VIA  NEBULIZER EVERY 6 HOURS AS NEEDED FOR WHEEZING, SHORTNESS OF BREATH 360 mL 3   LINZESS 145 MCG CAPS capsule TAKE 1 CAPSULE EVERY DAY BEFORE BREAKFAST 90 capsule 1   mirabegron ER (MYRBETRIQ) 50 MG TB24 tablet TAKE 1 TABLET BY MOUTH AS NEEDED FOR OVERACTIVE BLADDER 90 tablet 1   NONFORMULARY OR COMPOUNDED ITEM Antifungal solution: Terbinafine 3%, Fluconazole 2%, Tea Tree Oil 5%, Urea 10%, Ibuprofen 2% in DMSO suspension #64m 1 each 3   OXYGEN Inhale 2 L into the lungs at bedtime. Uses during the day as needed     potassium chloride (KLOR-CON) 10 MEQ tablet Take 20 mEq by mouth daily as needed.     torsemide (DEMADEX) 20 MG tablet Take 3 tablets (60 mg total) by mouth daily as needed. 90 tablet 3   TRUE METRIX BLOOD GLUCOSE TEST test strip TEST BLOOD SUGAR ONE TIME DAILY 100 strip 0   TRUEplus Lancets 33G MISC TEST BLOOD SUGAR ONE TIME DAILY 100 each 1   No current facility-administered medications for this encounter.    Vitals:   04/16/21 1504  BP: 104/60  Pulse: 76  SpO2: 94%  Weight: 75.8 kg (167 lb 3.2 oz)    Wt Readings from Last 3 Encounters:  04/16/21 75.8 kg (167 lb 3.2 oz)  04/16/21 74.2 kg (163 lb 9.6 oz)  02/04/21 73.3 kg (161 lb 9.6 oz)   Device Interrogation:    PHYSICAL EXAM: General: Well appearing. Daughter present. No resp difficulty HEENT: normal Neck: supple. JVP 8-9 Carotids 2+ bilat; no bruits. No lymphadenopathy or thryomegaly appreciated. Cor: PMI nondisplaced. Regular rate & rhythm. No rubs, gallops or murmurs. Lungs: clear, fine  crackles in bases Abdomen: soft, nontender, nondistended. No hepatosplenomegaly. No bruits or masses. Good bowel sounds. Extremities: no cyanosis, clubbing, rash, edema Neuro: alert & orientedx3, cranial nerves grossly intact. moves all 4 extremities w/o difficulty. Affect pleasant  ASSESSMENT & PLAN:  1. PAH - RHC 04/22 moderate PAH with normal left-sided pressures. PA = 63/14 (33). CO 4.52/CI 2.66. PVR 6.0 - RHC 01/2020 severe PAH w/ normal left-sided pressures. PA = 79/44 (53). CO/CI normal.  - Echo 03/22 EF 55-60%, RV fxn okay, RVSP 73 mmHg, moderate TR - Previously suspected predominently WHO Group 3 in the setting of restrictive lung disease and OSA. However PFTs have now normalized. ? If component of WHO Group 1. - Continue CPAP qhs - Sildenafil stopped during prior hospitalization due to hypotension.  - NYHA II. Could potentially retrial PDE5i but decided to hold off on this since she has been for now since she has been feeling well.   2. Chronic Systolic Heart Failure w/ Predominant RV Failure - Long standing history of chronic systolic heart failure/ NICM and pulmonary hypertension. Mild nonobstructive CAD by LHC in 2006 and again in 2015.  - [employer] ICD Device Interrogation: 4 very brief episodes AT/AF since February up to 2 min in duration, activity < 1 hr day, no VT/VF - Echo 01/2020 EF 45-50%. RV severely enlarged w/ moderately reduced systolic function 2/2 PH - Echo 03/22 EF 55-60%, RV okay, RVSP 73 mmHg, moderate TR - NYHA II. Appears slightly volume up. Recent 8 lb weight gain but now trending back down after taking prn torsemide at home. She has a good understanding of when she needs diuretic. - Continue Coreg 3.125 bid - CMET drawn at PCP's office today. Results pending.  3. Chronic hypoxic/hypercapnic respiratory failure - Multifactorial, 2/2 PAH, a/c CHF and restrictive lung disease  -  Continue pulmicort, duoneb - CPAP qhs - No longer requiring continuous 02, just  nocturnal - PFTs 2/22 now normalized.    4. CKD Stage IIIb.  - Cr baseline ~1.5-1.9, Scr 1.79 in 08/22 - Followed by Dr. Carolynne Edouard, visit notes from 09/01 reviewed - Consider SGLT2i   5. HTN  - BP at goal  - Norvasc and Coreg  6. T2DM - Per PCP    7. OSA - Continue CPAP qhs   F/u: 6 months with echo, recommended she call before f/u with any worsening dyspnea or other HF symptoms  FINCH, LINDSAY N, PA-C  4:51 PM  Patient seen and examined with the above-signed Advanced Practice Provider and/or Housestaff. I personally reviewed laboratory data, imaging studies and relevant notes. I independently examined the patient and formulated the important aspects of the plan. I have edited the note to reflect any of my changes or salient points. I have personally discussed the plan with the patient and/or family.  Overall doing well. NYHA II. Recent RHCs and echos reviewed. PA pressures and RV function improved but PVR remains high (6.0)   General:  Well appearing. No resp difficulty HEENT: normal Neck: supple. no JVD. Carotids 2+ bilat; no bruits. No lymphadenopathy or thryomegaly appreciated. Cor: PMI nondisplaced. Regular rate & rhythm. No rubs, gallops or murmurs. Lungs: clear Abdomen: soft, nontender, nondistended. No hepatosplenomegaly. No bruits or masses. Good bowel sounds. Extremities: no cyanosis, clubbing, rash, edema Neuro: alert & orientedx3, cranial nerves grossly intact. moves all 4 extremities w/o difficulty. Affect pleasant  As above, PA pressures and RV function improved but PVR remains high (6.0). PFTs have also normalized. Raises question of WHO Group I component. Can consider trial of PDE5i to see if it helps her.   FINCH, LINDSAY N, PA-C  4:51 PM  Patient seen and examined with the above-signed Advanced Practice Provider and/or Housestaff. I personally reviewed laboratory data, imaging studies and relevant notes. I independently examined the patient and formulated the  important aspects of the plan. I have edited the note to reflect any of my changes or salient points. I have personally discussed the plan with the patient and/or family.  Doing well. NYHA II. LV and RV function improved. Reviewed recent [place]. PA pressures and RV function much improved. Still with mild PAH and elevated PVR. BP well controlled.   General:  Well appearing. No resp difficulty HEENT: normal Neck: supple. no JVD. Carotids 2+ bilat; no bruits. No lymphadenopathy or thryomegaly appreciated. Cor: PMI nondisplaced. Regular rate & rhythm. No rubs, gallops or murmurs. Lungs: clear Abdomen: soft, nontender, nondistended. No hepatosplenomegaly. No bruits or masses. Good bowel sounds. Extremities: no cyanosis, clubbing, rash, edema Neuro: alert & orientedx3, cranial nerves grossly intact. moves all 4 extremities w/o difficulty. Affect pleasant  Doing very well. PA pressures and RV function have improved but PVR remains elevated (6.0). PFTs have also normalized. Raises question of WHO Group I component. We discussed potential trial of PDE5i but given lack of symptoms and only mild PAH will defer for now.   Glori Bickers, MD  9:45 AM

## 2021-04-16 NOTE — Telephone Encounter (Signed)
Spoke with patient's daughter [name], she states that patient has been telling her that she feels like she is not evacuating her bowels completely. Pt is currently on Linzess 145 mcg daily and iron 3 days a week (M,W,F). Denies any fiber supplements, probiotics, or stool softeners at this time. Patient's daughter is requesting that she have a colon cleanse. Patient has completed bowel purge earlier this year. Her last BM was today and pt reported that it was small. Advised that the iron could be causing her some constipation. Advised she may need imaging to confirm if it actually is constipation. Pt has been scheduled for a follow up with Carl Best, NP on Thursday, 05/01/21 at 3 PM. Please advise on any further recommendations prior to appt. Thanks

## 2021-04-17 LAB — CBC WITH DIFFERENTIAL/PLATELET
Basophils Absolute: 0 10*3/uL (ref 0.0–0.2)
Basos: 1 %
EOS (ABSOLUTE): 0.2 10*3/uL (ref 0.0–0.4)
Eos: 3 %
Hematocrit: 36.5 % (ref 34.0–46.6)
Hemoglobin: 12.3 g/dL (ref 11.1–15.9)
Immature Grans (Abs): 0 10*3/uL (ref 0.0–0.1)
Immature Granulocytes: 0 %
Lymphocytes Absolute: 1.6 10*3/uL (ref 0.7–3.1)
Lymphs: 32 %
MCH: 28 pg (ref 26.6–33.0)
MCHC: 33.7 g/dL (ref 31.5–35.7)
MCV: 83 fL (ref 79–97)
Monocytes Absolute: 0.5 10*3/uL (ref 0.1–0.9)
Monocytes: 11 %
Neutrophils Absolute: 2.7 10*3/uL (ref 1.4–7.0)
Neutrophils: 53 %
Platelets: 250 10*3/uL (ref 150–450)
RBC: 4.39 x10E6/uL (ref 3.77–5.28)
RDW: 13.1 % (ref 11.7–15.4)
WBC: 5.1 10*3/uL (ref 3.4–10.8)

## 2021-04-17 LAB — LIPID PANEL
Chol/HDL Ratio: 1.6 ratio (ref 0.0–4.4)
Cholesterol, Total: 166 mg/dL (ref 100–199)
HDL: 103 mg/dL (ref >39)
LDL Chol Calc (NIH): 51 mg/dL (ref 0–99)
Triglycerides: 58 mg/dL (ref 0–149)
VLDL Cholesterol Cal: 12 mg/dL (ref 5–40)

## 2021-04-17 LAB — COMPREHENSIVE METABOLIC PANEL
ALT: 6 IU/L (ref 0–32)
AST: 19 IU/L (ref 0–40)
Albumin/Globulin Ratio: 1.6 (ref 1.2–2.2)
Albumin: 4.8 g/dL — ABNORMAL HIGH (ref 3.6–4.6)
Alkaline Phosphatase: 209 IU/L — ABNORMAL HIGH (ref 44–121)
BUN/Creatinine Ratio: 19 (ref 12–28)
BUN: 60 mg/dL — ABNORMAL HIGH (ref 8–27)
Bilirubin Total: 0.3 mg/dL (ref 0.0–1.2)
CO2: 25 mmol/L (ref 20–29)
Calcium: 9.8 mg/dL (ref 8.7–10.3)
Chloride: 97 mmol/L (ref 96–106)
Creatinine, Ser: 3.22 mg/dL — ABNORMAL HIGH (ref 0.57–1.00)
Globulin, Total: 3 g/dL (ref 1.5–4.5)
Glucose: 112 mg/dL — ABNORMAL HIGH (ref 70–99)
Potassium: 4.7 mmol/L (ref 3.5–5.2)
Sodium: 142 mmol/L (ref 134–144)
Total Protein: 7.8 g/dL (ref 6.0–8.5)
eGFR: 14 mL/min/{1.73_m2} — ABNORMAL LOW (ref >59)

## 2021-04-17 LAB — HEMOGLOBIN A1C
Est. average glucose Bld gHb Est-mCnc: 126 mg/dL
Hgb A1c MFr Bld: 6 % — ABNORMAL HIGH (ref 4.8–5.6)

## 2021-04-17 LAB — TSH: TSH: 2.54 u[IU]/mL (ref 0.450–4.500)

## 2021-04-17 LAB — T4, FREE: Free T4: 1.65 ng/dL (ref 0.82–1.77)

## 2021-04-17 NOTE — Progress Notes (Signed)
Her kidney function is much worse. Please fax and forward her labs with a cover letter stating her kidney function is much worse to her nephrologist, Dr. [name].  Also, please have Verdis Frederickson add a fractionated alkaline phosphatase due to elevated alk phos. Thanks.

## 2021-04-17 NOTE — Telephone Encounter (Signed)
Spoke with patient's daughter in regards to recommendations. She was given instructions on how to complete Miralax bowel prep if needed for patient. She verbalized understanding of all information and had no concerns at the end of the call.

## 2021-04-18 ENCOUNTER — Telehealth: Payer: Self-pay

## 2021-04-18 DIAGNOSIS — G4733 Obstructive sleep apnea (adult) (pediatric): Secondary | ICD-10-CM | POA: Diagnosis not present

## 2021-04-18 DIAGNOSIS — I10 Essential (primary) hypertension: Secondary | ICD-10-CM | POA: Diagnosis not present

## 2021-04-18 DIAGNOSIS — J449 Chronic obstructive pulmonary disease, unspecified: Secondary | ICD-10-CM | POA: Diagnosis not present

## 2021-04-18 DIAGNOSIS — N3941 Urge incontinence: Secondary | ICD-10-CM

## 2021-04-18 DIAGNOSIS — I5089 Other heart failure: Secondary | ICD-10-CM | POA: Diagnosis not present

## 2021-04-18 MED ORDER — MIRABEGRON ER 50 MG PO TB24
ORAL_TABLET | ORAL | 5 refills | Status: DC
Start: 2021-04-18 — End: 2021-08-01

## 2021-04-18 NOTE — Progress Notes (Signed)
Please call patient to ask if she read my note and recommendations. I would like for her to be in touch with her nephrologist since her kidney function is worse. Thanks.

## 2021-04-21 LAB — ALKALINE PHOSPHATASE, ISOENZYMES
Alkaline Phosphatase: 212 IU/L — ABNORMAL HIGH (ref 44–121)
BONE FRACTION: 56 % (ref 14–68)
INTESTINAL FRAC.: 25 % — ABNORMAL HIGH (ref 0–18)
LIVER FRACTION: 19 % (ref 18–85)

## 2021-04-21 LAB — SPECIMEN STATUS REPORT

## 2021-04-21 NOTE — Progress Notes (Signed)
Please let her and her daughter know that her alkaline phosphatase came back elevated so I did an additional blood test and it looks as though it may be related to a GI issue. If she is not having any GI symptoms (nausea, constipation, bloating, diarrhea, etc) then she may be ok to not follow up with GI about this. There are other possible explanations for elevated alk phos levels. I recommend a follow up on this either with GI or a primary care in 3 months.

## 2021-04-23 NOTE — Telephone Encounter (Signed)
Pt notified 

## 2021-04-29 ENCOUNTER — Other Ambulatory Visit: Payer: Self-pay

## 2021-04-29 NOTE — Patient Outreach (Signed)
[facility]) Care Management  04/29/2021  [name] 1941-06-17 [id number]   Telephone call to patient for disease management follow up.   No answer.  HIPAA compliant voice message left.    Plan: If no return call, RN CM will attempt patient again in January.  [name], RN, MSN [facility] Care Management Coordinator Direct Line [phone] Cell [phone] Toll Free: [phone]  Fax: [phone]

## 2021-05-01 ENCOUNTER — Ambulatory Visit (INDEPENDENT_AMBULATORY_CARE_PROVIDER_SITE_OTHER): Payer: Medicare PPO | Admitting: Nurse Practitioner

## 2021-05-01 ENCOUNTER — Encounter: Payer: Self-pay | Admitting: Nurse Practitioner

## 2021-05-01 VITALS — BP 120/60 | HR 64 | Ht 63.0 in | Wt 166.2 lb

## 2021-05-01 DIAGNOSIS — K5900 Constipation, unspecified: Secondary | ICD-10-CM

## 2021-05-01 MED ORDER — LINACLOTIDE 290 MCG PO CAPS: ORAL_CAPSULE | ORAL | 2 refills | Status: DC

## 2021-05-01 NOTE — Progress Notes (Signed)
AP elevation appears new in recent months. Fractionalization shows mostly intestinal component and hepatic portion at lower end of normal. I would repeat LFTs in 1-2 months and repeat with a GGT. If the GGT is low / normal, would be consistent with nonhepatic etiology. If GGT is high would do additional labs and [place]. Thanks

## 2021-05-01 NOTE — Progress Notes (Signed)
05/01/2021 [name] [id number] 1941-01-08   Chief Complaint: Constipation   History of Present Illness: [name] is a 80 year old female with a past medical history of CAD, systolic CHF (LV EF 55%), ICD, COPD, OSA on CPAP, on home oxygen, CVA, diabetes mellitus type 2, CKD stage IV, IDA, colon polyps and chronic constipation.  He is followed by Dr. Havery Moros.  She presents her office today for follow-up regarding chronic constipation.  She is accompanied by her daughter.  She had significant constipation, no bowel movement for least 5 days a few weeks ago.  She contacted our office and she was prescribed a MiraLAX purge took several days before she started passing any bowel movements.  Linzess  126mg was increased to 2 tabs p.o. daily with some improvement.  She is now passing a soft formed brown bowel movement every third day, however, she does not feel emptied.  She feels as if stool is slower to pass out of her rectum.  No rectal bleeding or black stools.  She eats a fairly healthy high-fiber diet.  She drinks three 8 ounce glasses of water daily.  She stated she was on a fluid restriction secondary to having CHF.  Her weight is stable.  She underwent an EGD and colonoscopy 03/29/2018 due to having IDA.  See results below.  She remains on ferrous sulfate 325 mg 3 days weekly.  EGD 03/29/18: The exam of the esophagus was normal. The entire examined stomach was normal. Biopsies were taken from the antrum, body, and incisura with a cold forceps for Helicobacter pylori testing. The duodenal bulb and second portion of the duodenum were normal. The duodenal sweep was quite angulated and difficult to visualize  Colonoscopy 03/29/18: The examined portion of the ileum was normal. - Tortuous colon with looping, poor retention of air associated with significant spasm which prolonged the procedure. - One diminutive polyp in the ascending colon, removed with a cold biopsy  forceps. Resected and retrieved. - One 4 mm polyp in the transverse colon, removed with a cold snare. Resected and retrieved. - One 3 mm polyp at the splenic flexure, removed with a cold snare. Resected and retrieved. - One 4 mm polyp in the descending colon, removed with a cold snare. Resected and retrieved. - Two 3 to 4 mm polyps in the sigmoid colon, removed with a cold snare. Resected and retrieved. - The examination was otherwise normal. No obvious cause for iron deficiency on this examination.   1. Stomach, biopsy - GASTRIC ANTRAL AND OXYNTIC MUCOSA WITH MILD CHRONIC GASTRITIS. - FOCAL INTESTINAL METAPLASIA OF ANTRAL MUCOSA, NEGATIVE FOR DYSPLASIA. - WARTHIN-STARRY STAIN IS NEGATIVE FOR HELICOBACTER PYLORI. 2. Colon, polyp(s), ascending, transverse, splenic flexure, descending, sigmoid - TUBULAR ADENOMA(S). - NEGATIVE FOR HIGH GRADE DYSPLASIA OR MALIGNANCY.  CBC Latest Ref Rng & Units 04/16/2021 10/31/2020 10/31/2020  WBC 3.4 - 10.8 x10E3/uL 5.1 - -  Hemoglobin 11.1 - 15.9 g/dL 12.3 11.2(L) 11.6(L)  Hematocrit 34.0 - 46.6 % 36.5 33.0(L) 34.0(L)  Platelets 150 - 450 x10E3/uL 250 - -      CMP Latest Ref Rng & Units 04/16/2021 04/16/2021 10/31/2020  Glucose 70 - 99 mg/dL - 112(H) -  BUN 8 - 27 mg/dL - 60(H) -  Creatinine 0.57 - 1.00 mg/dL - 3.22(H) -  Sodium 134 - 144 mmol/L - 142 144  Potassium 3.5 - 5.2 mmol/L - 4.7 3.7  Chloride 96 - 106 mmol/L - 97 -  CO2 20 - 29 mmol/L -  25 -  Calcium 8.7 - 10.3 mg/dL - 9.8 -  Total Protein 6.0 - 8.5 g/dL - 7.8 -  Total Bilirubin 0.0 - 1.2 mg/dL - 0.3 -  Alkaline Phos 44 - 121 IU/L 212(H) 209(H) -  AST 0 - 40 IU/L - 19 -  ALT 0 - 32 IU/L - 6 -    Current Outpatient Medications on File Prior to Visit  Medication Sig Dispense Refill   Alcohol Swabs (DROPSAFE ALCOHOL PREP) 70 % PADS TEST BLOOD SUGAR ONE TIME DAILY 100 each 1   amLODipine (NORVASC) 10 MG tablet Take 10 mg by mouth at bedtime.     aspirin 81 MG tablet Take 81 mg by mouth  at bedtime.      atorvastatin (LIPITOR) 40 MG tablet TAKE 1 TABLET EVERY DAY 90 tablet 0   Blood Glucose Calibration (TRUE METRIX LEVEL 1) Low SOLN USE AS DIRECTED WITH GLUCOSE METER 1 each 0   Blood Glucose Monitoring Suppl (TRUE METRIX METER) DEVI Test daily  Pt will need a true metrix meter E11.9 1 each 0   carvedilol (COREG) 3.125 MG tablet Take 1 tablet (3.125 mg total) by mouth 2 (two) times daily with a meal. 180 tablet 3   cholecalciferol (VITAMIN D3) 25 MCG (1000 UNIT) tablet Take 1,000 Units by mouth daily.     ferrous sulfate 325 (65 FE) MG tablet Take 325 mg by mouth every Monday, Wednesday, and Friday.     mirabegron ER (MYRBETRIQ) 50 MG TB24 tablet TAKE 1 TABLET BY MOUTH AS NEEDED FOR OVERACTIVE BLADDER 30 tablet 5   NONFORMULARY OR COMPOUNDED ITEM Antifungal solution: Terbinafine 3%, Fluconazole 2%, Tea Tree Oil 5%, Urea 10%, Ibuprofen 2% in DMSO suspension #28m 1 each 3   OXYGEN Inhale 2 L into the lungs at bedtime. Uses during the day as needed     potassium chloride (KLOR-CON) 10 MEQ tablet Take 20 mEq by mouth daily as needed. 10 mg prn with furosemide     torsemide (DEMADEX) 20 MG tablet Take 3 tablets (60 mg total) by mouth daily as needed. 90 tablet 3   TRUE METRIX BLOOD GLUCOSE TEST test strip TEST BLOOD SUGAR ONE TIME DAILY 100 strip 0   TRUEplus Lancets 33G MISC TEST BLOOD SUGAR ONE TIME DAILY 100 each 1   No current facility-administered medications on file prior to visit.   Allergies  Allergen Reactions   Benazepril Hcl Other (See Comments)    Unknown   Lotrel [Amlodipine Besy-Benazepril Hcl] Other (See Comments)    Hypotension    Talwin [Pentazocine] Other (See Comments)    Hallucinations      Current Medications, Allergies, Past Medical History, Past Surgical History, Family History and Social History were reviewed in [employer].   Review of Systems:   Constitutional: Negative for fever, sweats, chills or weight loss.   Respiratory: Negative for shortness of breath.   Cardiovascular: Negative for chest pain, palpitations and leg swelling.  Gastrointestinal: See HPI.  Musculoskeletal: Negative for back pain or muscle aches.  Neurological: Negative for dizziness, headaches or paresthesias.    Physical Exam: BP 120/60   Pulse 64   Ht _0  (1.6 m)   Wt 166 lb 4 oz (75.4 kg)   BMI 29.45 kg/m  Wt Readings from Last 3 Encounters:  05/01/21 166 lb 4 oz (75.4 kg)  04/16/21 167 lb 3.2 oz (75.8 kg)  04/16/21 163 lb 9.6 oz (74.2 kg)    General: 80 year  old female in NAD.  Head: Normocephalic and atraumatic. Eyes: No scleral icterus. Conjunctiva pink . Ears: Normal auditory acuity. Mouth: Upper and lower dentures.  No ulcers or lesions.  Lungs: Clear throughout to auscultation. Hea rt: Regular rate and rhythm, no murmur. Abdomen: Soft, nontender and nondistended. No masses or hepatomegaly. Normal bowel sounds x 4 quadrants.  Rectal: Deferred. Musculoskeletal: Symmetrical with no gross deformities. Extremities: No edema. Neurological: Alert oriented x 4. No focal deficits.  Psychological: Alert and cooperative. Normal mood and affect  Assessment and Recommendations:  3) 80 year old female with chronic constipation which somewhat improved after Linzess was increased to 290 mcg daily. -Linzess to 90 mcg 1 tab p.o. to be taken 30 minutes prior to breakfast -Dulcolax suppository every second or third night as needed -Smooth move tea daily as needed -Follow-up as needed  2) IDA on Ferrous Sulfate 325 mg 3 days weekly.  Stable hemoglobin level 12.3 on 04/16/2021.  EGD and colonoscopy 03/2018 without etiology for IDA.  3) History of 6 tubular adenomatous polyps removed from the colon per colonoscopy 03/2018 -No further colonoscopies due to age  90) CHF with ICD  5) CKD stave IV  6) Elevated alk phosphatase level.  209 - > 212 with elevated intestinal isoenzyme fraction.  Normal total bili, AST and ALT  levels. -Consider abdominal imaging to assess the liver/bowel and additional laboratory studies to include GGT, AMA, SMA and ANA.  I will consult further with Dr. Havery Moros prior to ordering additional tests.

## 2021-05-01 NOTE — Patient Instructions (Addendum)
If you are age 82 or older, your body mass index should be between 23-30. Your Body mass index is 29.45 kg/m. If this is out of the aforementioned range listed, please consider follow up with your Primary Care Provider.  The [facility] providers would like to encourage you to use [facility] to communicate with providers for non-urgent requests or questions.  Due to long hold times on the telephone, sending your provider a message by [facility] may be faster and more efficient way to get a response. Please allow 48 business hours for a response.  Please remember that this is for non-urgent requests/questions.   MEDICATION: We have sent the following medication to your pharmacy for you to pick up at your convenience: Linzess 290 mcg tablet, take 1 tablet daily 30 minutes before breakfast.  RECOMMENDATIONS: Smooth Move Tea once a day as needed. Dulcolax suppository every 2nd-3rd night as needed. This is over the counter. Follow up as needed.  It was great seeing you today! Thank you for entrusting me with your care and choosing [facility].  Noralyn Pick, CRNP

## 2021-05-07 DIAGNOSIS — I10 Essential (primary) hypertension: Secondary | ICD-10-CM | POA: Diagnosis not present

## 2021-05-07 DIAGNOSIS — G4733 Obstructive sleep apnea (adult) (pediatric): Secondary | ICD-10-CM | POA: Diagnosis not present

## 2021-05-07 DIAGNOSIS — J449 Chronic obstructive pulmonary disease, unspecified: Secondary | ICD-10-CM | POA: Diagnosis not present

## 2021-05-07 DIAGNOSIS — I5089 Other heart failure: Secondary | ICD-10-CM | POA: Diagnosis not present

## 2021-05-08 DIAGNOSIS — R609 Edema, unspecified: Secondary | ICD-10-CM | POA: Diagnosis not present

## 2021-05-08 DIAGNOSIS — N1832 Chronic kidney disease, stage 3b: Secondary | ICD-10-CM | POA: Diagnosis not present

## 2021-05-08 DIAGNOSIS — N2581 Secondary hyperparathyroidism of renal origin: Secondary | ICD-10-CM | POA: Diagnosis not present

## 2021-05-08 DIAGNOSIS — D631 Anemia in chronic kidney disease: Secondary | ICD-10-CM | POA: Diagnosis not present

## 2021-05-08 DIAGNOSIS — I509 Heart failure, unspecified: Secondary | ICD-10-CM | POA: Diagnosis not present

## 2021-05-08 DIAGNOSIS — I129 Hypertensive chronic kidney disease with stage 1 through stage 4 chronic kidney disease, or unspecified chronic kidney disease: Secondary | ICD-10-CM | POA: Diagnosis not present

## 2021-05-11 NOTE — Progress Notes (Signed)
Beth, pls contact the patient and let him now Dr. Havery Moros recommended checking labs in 1 to 2 months. Pls provide him with a lab order for a hepatic panel and GGT level due in 1 to 2 months. Thx

## 2021-05-13 ENCOUNTER — Other Ambulatory Visit: Payer: Self-pay

## 2021-05-13 ENCOUNTER — Telehealth: Payer: Self-pay

## 2021-05-13 DIAGNOSIS — R748 Abnormal levels of other serum enzymes: Secondary | ICD-10-CM

## 2021-05-13 NOTE — Telephone Encounter (Signed)
Author: Noralyn Pick, NP Author Type: Nurse Practitioner Filed: 05/11/2021  7:43 PM  Note Status: Signed Cosign: Cosign Not Required Encounter Date: 05/01/2021  Editor: Noralyn Pick, NP (Nurse Practitioner)              Beth, pls contact the patient and let him now Dr. Havery Moros recommended checking labs in 1 to 2 months. Pls provide him with a lab order for a hepatic panel and GGT level due in 1 to 2 months. Thx

## 2021-05-18 DIAGNOSIS — I5089 Other heart failure: Secondary | ICD-10-CM | POA: Diagnosis not present

## 2021-05-18 DIAGNOSIS — I10 Essential (primary) hypertension: Secondary | ICD-10-CM | POA: Diagnosis not present

## 2021-05-18 DIAGNOSIS — J449 Chronic obstructive pulmonary disease, unspecified: Secondary | ICD-10-CM | POA: Diagnosis not present

## 2021-05-18 DIAGNOSIS — G4733 Obstructive sleep apnea (adult) (pediatric): Secondary | ICD-10-CM | POA: Diagnosis not present

## 2021-05-19 ENCOUNTER — Other Ambulatory Visit: Payer: Self-pay

## 2021-05-19 ENCOUNTER — Ambulatory Visit (INDEPENDENT_AMBULATORY_CARE_PROVIDER_SITE_OTHER): Payer: Medicare PPO

## 2021-05-19 ENCOUNTER — Ambulatory Visit: Payer: Medicare PPO

## 2021-05-19 DIAGNOSIS — Z23 Encounter for immunization: Secondary | ICD-10-CM

## 2021-05-19 DIAGNOSIS — Z961 Presence of intraocular lens: Secondary | ICD-10-CM | POA: Diagnosis not present

## 2021-05-19 DIAGNOSIS — H02831 Dermatochalasis of right upper eyelid: Secondary | ICD-10-CM | POA: Diagnosis not present

## 2021-05-19 DIAGNOSIS — H02834 Dermatochalasis of left upper eyelid: Secondary | ICD-10-CM | POA: Diagnosis not present

## 2021-05-19 DIAGNOSIS — H43811 Vitreous degeneration, right eye: Secondary | ICD-10-CM | POA: Diagnosis not present

## 2021-05-19 DIAGNOSIS — E119 Type 2 diabetes mellitus without complications: Secondary | ICD-10-CM | POA: Diagnosis not present

## 2021-05-20 ENCOUNTER — Other Ambulatory Visit: Payer: Self-pay

## 2021-05-20 MED ORDER — ATORVASTATIN CALCIUM 40 MG PO TABS: 40.0000 mg | ORAL_TABLET | Freq: Every day | ORAL | 1 refills | Status: DC

## 2021-05-23 ENCOUNTER — Encounter: Payer: Self-pay | Admitting: Family Medicine

## 2021-06-03 ENCOUNTER — Ambulatory Visit: Payer: Medicare PPO | Admitting: Podiatry

## 2021-06-03 ENCOUNTER — Encounter: Payer: Self-pay | Admitting: Podiatry

## 2021-06-03 ENCOUNTER — Other Ambulatory Visit: Payer: Self-pay

## 2021-06-03 DIAGNOSIS — B351 Tinea unguium: Secondary | ICD-10-CM

## 2021-06-03 DIAGNOSIS — E119 Type 2 diabetes mellitus without complications: Secondary | ICD-10-CM

## 2021-06-03 DIAGNOSIS — M2012 Hallux valgus (acquired), left foot: Secondary | ICD-10-CM

## 2021-06-03 DIAGNOSIS — E1142 Type 2 diabetes mellitus with diabetic polyneuropathy: Secondary | ICD-10-CM | POA: Diagnosis not present

## 2021-06-03 DIAGNOSIS — M2042 Other hammer toe(s) (acquired), left foot: Secondary | ICD-10-CM

## 2021-06-03 DIAGNOSIS — M2041 Other hammer toe(s) (acquired), right foot: Secondary | ICD-10-CM | POA: Diagnosis not present

## 2021-06-03 DIAGNOSIS — L84 Corns and callosities: Secondary | ICD-10-CM

## 2021-06-03 DIAGNOSIS — M79674 Pain in right toe(s): Secondary | ICD-10-CM

## 2021-06-03 DIAGNOSIS — M2011 Hallux valgus (acquired), right foot: Secondary | ICD-10-CM | POA: Diagnosis not present

## 2021-06-03 DIAGNOSIS — M79675 Pain in left toe(s): Secondary | ICD-10-CM

## 2021-06-03 NOTE — Progress Notes (Signed)
ANNUAL DIABETIC FOOT EXAM  Subjective: [name] presents today for for annual diabetic foot examination and callus(es) of both feet and painful thick toenails that are difficult to trim. Painful toenails interfere with ambulation. Aggravating factors include wearing enclosed shoe gear. Pain is relieved with periodic professional debridement. Painful calluses are aggravated when weightbearing with and without shoegear. Pain is relieved with periodic professional debridement..  Patient relates 10 year h/o diabetes. She controls via diet.  Patient denies any h/o foot wounds.  Patient denies any numbness, tingling, burning, or pins/needle sensation in feet.  Patient's blood sugar was 90 mg/dl yesterday. Patient did not check blood glucose this morning.  Girtha Rm, PA-C is patient's PCP. Last visit was 04/16/2021.  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 Rollene Rotunda in [place]. Per notes she was due for colonoscopy in 12/2016   OSA on CPAP 09/08/2017   Other cardiomyopathies ([place])    Oxygen deficiency    2 liters at night   Pulmonary HTN (HCC)    Sleep apnea    uses c-pap   Stroke [facility])    Type 2 diabetes, controlled, with peripheral neuropathy (HCC)    Urge incontinence of urine    Patient Active Problem List   Diagnosis Date Noted   Acute on chronic systolic CHF (congestive heart failure) ([place]) 03/04/2020   Transient hypotension 03/04/2020   Acute renal failure superimposed on stage 4 chronic kidney disease ([place]) 03/04/2020   Pulmonary hypertension, unspecified ([place])    Acute on  chronic combined systolic and diastolic CHF (congestive heart failure) ([place]) 02/12/2020   Chronic respiratory failure with hypoxia ([place]) 02/12/2020   CHF exacerbation ([place]) 02/12/2020   D dimer value normal    Diabetes mellitus type 2, diet-controlled ([place])    Abnormal result of cardiovascular function study, unspecified 09/16/2018   Callus 09/16/2018   Claudication of both lower extremities ([place]) 09/16/2018   Dependence on other enabling machines and devices 09/16/2018   Dizziness 09/16/2018   Dyspnea, unspecified 09/16/2018   Medicare annual wellness visit, subsequent 09/16/2018   Onychomycosis due to dermatophyte 09/16/2018   Overgrown toenails 09/16/2018   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   Iron deficiency anemia    On home O2    Benign neoplasm of colon    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   PAT (paroxysmal atrial tachycardia) ([place]) 09/09/2017   Hypercholesterolemia 09/09/2017   OSA on CPAP 09/08/2017   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   Type 2 diabetes, controlled, with peripheral neuropathy ([place])    Other cardiomyopathies ([place])    Chronic combined systolic (congestive) and diastolic (congestive) heart failure (HCC)    CKD (chronic kidney disease) stage 4, GFR 15-29 ml/min (HCC)    ICD (implantable cardioverter-defibrillator) in place    Urge incontinence of urine    Coronary artery disease involving native coronary artery of native heart without angina pectoris    Chronic kidney disease 09/06/2017   Restrictive lung disease 05/04/2017   Body mass index (bmi)  33.0-33.9, adult 06/13/2014   DEPRESSION 07/26/2007   Essential hypertension 07/26/2007   CARDIOMYOPATHY 07/26/2007   COPD (chronic obstructive pulmonary disease) ([place]) 07/26/2007   Past Surgical History:  Procedure Laterality Date   BIOPSY  03/29/2018   Procedure: BIOPSY;  Surgeon: Yetta Flock, MD;  Location: [facility];  Service: Gastroenterology;;    COLONOSCOPY     COLONOSCOPY WITH PROPOFOL N/A 03/29/2018   Procedure: COLONOSCOPY WITH PROPOFOL;  Surgeon: Yetta Flock, MD;  Location: [facility];  Service: Gastroenterology;  Laterality: N/A;   ESOPHAGOGASTRODUODENOSCOPY (EGD) WITH PROPOFOL N/A 03/29/2018   Procedure: ESOPHAGOGASTRODUODENOSCOPY (EGD) WITH PROPOFOL;  Surgeon: Yetta Flock, MD;  Location: [facility];  Service: Gastroenterology;  Laterality: N/A;   POLYPECTOMY  03/29/2018   Procedure: POLYPECTOMY;  Surgeon: Yetta Flock, MD;  Location: [facility];  Service: Gastroenterology;;   RIGHT HEART CATH N/A 02/15/2020   Procedure: RIGHT HEART CATH;  Surgeon: Jolaine Artist, MD;  Location: [facility];  Service: Cardiovascular;  Laterality: N/A;   RIGHT HEART CATH N/A 10/31/2020   Procedure: RIGHT HEART CATH;  Surgeon: Martinique, Peter M, MD;  Location: [facility];  Service: Cardiovascular;  Laterality: N/A;   TONSILLECTOMY AND ADENOIDECTOMY     age 38   TUBAL LIGATION     years ago   UPPER GASTROINTESTINAL ENDOSCOPY     UPPER GI ENDOSCOPY  11/28/2009   mild erosive esophagitis, mild erosive changes in stomach, chronic constipation   Current Outpatient Medications on File Prior to Visit  Medication Sig Dispense Refill   Alcohol Swabs (DROPSAFE ALCOHOL PREP) 70 % PADS TEST BLOOD SUGAR ONE TIME DAILY 100 each 1   amLODipine (NORVASC) 10 MG tablet Take 10 mg by mouth at bedtime.     aspirin 81 MG tablet Take 81 mg by mouth at bedtime.      atorvastatin (LIPITOR) 40 MG tablet Take 1 tablet (40 mg total) by mouth daily. 90 tablet 1   Blood Glucose Calibration (TRUE METRIX LEVEL 1) Low SOLN USE AS DIRECTED WITH GLUCOSE METER 1 each 0   Blood Glucose Monitoring Suppl (TRUE METRIX METER) DEVI Test daily  Pt will need a true metrix meter E11.9 1 each 0   carvedilol (COREG) 3.125 MG tablet Take 1 tablet (3.125 mg total) by mouth 2 (two) times daily with a meal. 180 tablet 3   cholecalciferol  (VITAMIN D3) 25 MCG (1000 UNIT) tablet Take 1,000 Units by mouth daily.     ferrous sulfate 325 (65 FE) MG tablet Take 325 mg by mouth every Monday, Wednesday, and Friday.     linaclotide (LINZESS) 290 MCG CAPS capsule Take 1 capsule by mouth daily 30 minutes before breakfast. 30 capsule 2   mirabegron ER (MYRBETRIQ) 50 MG TB24 tablet TAKE 1 TABLET BY MOUTH AS NEEDED FOR OVERACTIVE BLADDER 30 tablet 5   NONFORMULARY OR COMPOUNDED ITEM Antifungal solution: Terbinafine 3%, Fluconazole 2%, Tea Tree Oil 5%, Urea 10%, Ibuprofen 2% in DMSO suspension #41m 1 each 3   OXYGEN Inhale 2 L into the lungs at bedtime. Uses during the day as needed     potassium chloride (KLOR-CON) 10 MEQ tablet Take 20 mEq by mouth daily as needed. 10 mg prn with furosemide     torsemide (DEMADEX) 20 MG tablet Take 3 tablets (60 mg total) by mouth daily as needed. 90 tablet 3   TRUE METRIX BLOOD GLUCOSE TEST test strip TEST BLOOD SUGAR ONE TIME DAILY 100 strip 0  TRUEplus Lancets 33G MISC TEST BLOOD SUGAR ONE TIME DAILY 100 each 1   No current facility-administered medications on file prior to visit.    Allergies  Allergen Reactions   Benazepril Hcl Other (See Comments)    Unknown   Lotrel [Amlodipine Besy-Benazepril Hcl] Other (See Comments)    Hypotension    Talwin [Pentazocine] Other (See Comments)    Hallucinations     Social History   Occupational History   Not on file  Tobacco Use   Smoking status: Former    Types: Cigarettes    Quit date: 03/14/2009    Years since quitting: 12.2   Smokeless tobacco: Never   Tobacco comments:    quit smoking 2010  Vaping Use   Vaping Use: Never used  Substance and Sexual Activity   Alcohol use: No   Drug use: No   Sexual activity: Not on file   Family History  Problem Relation Age of Onset   Colon polyps Daughter    Hypertension Daughter    Hypertension Mother    Stroke Mother    Hypertension Father    Diabetes Brother    Hypertension Brother     Hypertension Daughter    Colon cancer Neg Hx    Esophageal cancer Neg Hx    Rectal cancer Neg Hx    Stomach cancer Neg Hx    Immunization History  Administered Date(s) Administered   Fluad Quad(high Dose 65+) 04/04/2019, 04/15/2020, 04/16/2021   Influenza, High Dose Seasonal PF 08/04/2016, 05/02/2017, 04/06/2018   Influenza-Unspecified 05/23/2013, 08/25/2013, 06/22/2014, 06/22/2014, 05/22/2015   PFIZER(Purple Top)SARS-COV-2 Vaccination 09/12/2019, 10/03/2019, 04/30/2020   Pfizer Covid-19 Vaccine Bivalent Booster 57yr & up 05/19/2021   Pneumococcal Conjugate-13 06/22/2014   Pneumococcal-Unspecified 01/12/2008   Tdap 01/12/2008   Zoster, Live 07/21/2011, 12/08/2011    Review of Systems: Negative except as noted in the HPI.   Objective: There were no vitals filed for this visit.  AGAYLON MELCHORis a pleasant 80y.o. female in NAD. AAO X 3.  Vascular Examination: CFT <3 seconds b/l LE. Faintly palpable pedal pulses b/l LE. Pedal hair absent b/l LE. Skin temperature gradient WNL b/l. No pain with calf compression b/l. No edema b/l LE. No cyanosis or clubbing noted b/l LE.  Dermatological Examination: Pedal integument with normal turgor, texture and tone BLE. No open wounds b/l LE. No interdigital macerations noted b/l LE. Toenails 1-5 b/l elongated, discolored, dystrophic, thickened, crumbly with subungual debris and tenderness to dorsal palpation. Hyperkeratotic lesion(s) submet head 1 b/l and submet head 5 b/l.  No erythema, no edema, no drainage, no fluctuance.  Musculoskeletal Examination: Normal muscle strength 5/5 to all lower extremity muscle groups bilaterally. No pain, crepitus or joint limitation noted with ROM b/l lower extremities. 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  Footwear Assessment: Does the patient wear appropriate shoes? Yes. Does the patient need inserts/orthotics? Yes.  Neurological Examination: Protective sensation  decreased with 10 gram monofilament b/l. Vibratory sensation intact b/l.  Hemoglobin A1C Latest Ref Rng & Units 04/16/2021 10/16/2020  HGBA1C 4.8 - 5.6 % 6.0(H) 5.6  Some recent data might be hidden   Assessment: 1. Pain due to onychomycosis of toenails of both feet   2. Callus   3. Hallux valgus, acquired, bilateral   4. Acquired hammertoes of both feet   5. Diabetic peripheral neuropathy associated with type 2 diabetes mellitus ([place]   6. Encounter for diabetic foot exam ([place]     ADA  Risk Categorization: High Risk  Patient has one or more of the following: Loss of protective sensation Absent pedal pulses Severe Foot deformity History of foot ulcer  Plan: -Examined patient. -Diabetic foot examination performed today. -Continue diabetic foot care principles: inspect feet daily, monitor glucose as recommended by PCP and/or Endocrinologist, and follow prescribed diet per PCP, Endocrinologist and/or dietician. -Patient to continue soft, supportive shoe gear daily. Start procedure for diabetic shoes. Patient qualifies based on diagnoses. -Mycotic toenails 1-5 bilaterally were debrided in length and girth with sterile nail nippers and dremel without incident. -Callus(es) submet head 1 b/l and submet head 5 b/l pared utilizing sterile scalpel blade without complication or incident. Total number debrided =4. -Patient/POA to call should there be question/concern in the interim.  Return in about 3 months (around 09/03/2021).  Marzetta Board, DPM

## 2021-06-04 ENCOUNTER — Ambulatory Visit (INDEPENDENT_AMBULATORY_CARE_PROVIDER_SITE_OTHER): Payer: Medicare PPO

## 2021-06-04 DIAGNOSIS — I428 Other cardiomyopathies: Secondary | ICD-10-CM

## 2021-06-04 LAB — CUP PACEART REMOTE DEVICE CHECK
Battery Remaining Longevity: 18 mo
Battery Voltage: 2.91 V
Brady Statistic AP VP Percent: 0.12 %
Brady Statistic AP VS Percent: 64.3 %
Brady Statistic AS VP Percent: 0.02 %
Brady Statistic AS VS Percent: 35.56 %
Brady Statistic RA Percent Paced: 63.7 %
Brady Statistic RV Percent Paced: 0.17 %
Date Time Interrogation Session: 20221116044225
HighPow Impedance: 39 Ohm
HighPow Impedance: 51 Ohm
Implantable Pulse Generator Implant Date: 20131120
Lead Channel Impedance Value: 304 Ohm
Lead Channel Impedance Value: 380 Ohm
Lead Channel Impedance Value: 380 Ohm
Lead Channel Pacing Threshold Amplitude: 0.75 V
Lead Channel Pacing Threshold Amplitude: 0.75 V
Lead Channel Pacing Threshold Pulse Width: 0.4 ms
Lead Channel Pacing Threshold Pulse Width: 0.4 ms
Lead Channel Sensing Intrinsic Amplitude: 0.625 mV
Lead Channel Sensing Intrinsic Amplitude: 0.625 mV
Lead Channel Sensing Intrinsic Amplitude: 2.75 mV
Lead Channel Sensing Intrinsic Amplitude: 2.75 mV
Lead Channel Setting Pacing Amplitude: 1.5 V
Lead Channel Setting Pacing Amplitude: 2 V
Lead Channel Setting Pacing Pulse Width: 0.4 ms
Lead Channel Setting Sensing Sensitivity: 0.3 mV

## 2021-06-06 ENCOUNTER — Ambulatory Visit: Payer: Medicare PPO | Admitting: Podiatry

## 2021-06-11 NOTE — Progress Notes (Signed)
Remote ICD transmission.   

## 2021-07-08 ENCOUNTER — Other Ambulatory Visit: Payer: Self-pay

## 2021-07-17 ENCOUNTER — Other Ambulatory Visit: Payer: Self-pay

## 2021-07-17 ENCOUNTER — Telehealth: Payer: Self-pay | Admitting: Nurse Practitioner

## 2021-07-17 DIAGNOSIS — R748 Abnormal levels of other serum enzymes: Secondary | ICD-10-CM

## 2021-07-17 NOTE — Telephone Encounter (Signed)
Order for LFT to be drawn at [facility] on [facility] faxed to [phone] per request.

## 2021-07-17 NOTE — Telephone Encounter (Signed)
Patients daughter [name] called would like to have the pending lab order sent to [facility] for them to do. Requested a call back once done so she knows.

## 2021-07-22 ENCOUNTER — Other Ambulatory Visit: Payer: Self-pay | Admitting: Nurse Practitioner

## 2021-07-23 LAB — HEPATIC FUNCTION PANEL
ALT: 9 IU/L (ref 0–32)
AST: 19 IU/L (ref 0–40)
Albumin: 4.7 g/dL — ABNORMAL HIGH (ref 3.6–4.6)
Alkaline Phosphatase: 139 IU/L — ABNORMAL HIGH (ref 44–121)
Bilirubin Total: 0.4 mg/dL (ref 0.0–1.2)
Bilirubin, Direct: 0.15 mg/dL (ref 0.00–0.40)
Total Protein: 7.1 g/dL (ref 6.0–8.5)

## 2021-07-24 ENCOUNTER — Telehealth: Payer: Self-pay

## 2021-07-24 NOTE — Patient Outreach (Addendum)
Called Ms. Mcdade to let her know her appointment on January 10 with Jon Billings, RN will be reschedule in February.   [facility], [place], [place] [phone]

## 2021-07-25 ENCOUNTER — Other Ambulatory Visit: Payer: Self-pay

## 2021-07-25 DIAGNOSIS — R748 Abnormal levels of other serum enzymes: Secondary | ICD-10-CM

## 2021-07-29 ENCOUNTER — Ambulatory Visit: Payer: Self-pay

## 2021-07-29 ENCOUNTER — Other Ambulatory Visit: Payer: Self-pay

## 2021-07-29 DIAGNOSIS — R748 Abnormal levels of other serum enzymes: Secondary | ICD-10-CM

## 2021-07-31 ENCOUNTER — Other Ambulatory Visit: Payer: Self-pay

## 2021-08-01 ENCOUNTER — Other Ambulatory Visit: Payer: Self-pay

## 2021-08-01 DIAGNOSIS — N3941 Urge incontinence: Secondary | ICD-10-CM

## 2021-08-01 MED ORDER — MIRABEGRON ER 50 MG PO TB24: ORAL_TABLET | ORAL | 0 refills | Status: DC

## 2021-08-04 ENCOUNTER — Other Ambulatory Visit: Payer: Self-pay

## 2021-08-04 ENCOUNTER — Ambulatory Visit (INDEPENDENT_AMBULATORY_CARE_PROVIDER_SITE_OTHER): Payer: Medicare PPO | Admitting: Cardiovascular Disease

## 2021-08-04 ENCOUNTER — Encounter: Payer: Self-pay | Admitting: Cardiovascular Disease

## 2021-08-04 VITALS — BP 136/72 | HR 60 | Ht 61.0 in | Wt 169.8 lb

## 2021-08-04 DIAGNOSIS — Z9989 Dependence on other enabling machines and devices: Secondary | ICD-10-CM

## 2021-08-04 DIAGNOSIS — I4719 Other supraventricular tachycardia: Secondary | ICD-10-CM

## 2021-08-04 DIAGNOSIS — I10 Essential (primary) hypertension: Secondary | ICD-10-CM

## 2021-08-04 DIAGNOSIS — Z95810 Presence of automatic (implantable) cardiac defibrillator: Secondary | ICD-10-CM

## 2021-08-04 DIAGNOSIS — I471 Supraventricular tachycardia: Secondary | ICD-10-CM

## 2021-08-04 DIAGNOSIS — G4733 Obstructive sleep apnea (adult) (pediatric): Secondary | ICD-10-CM

## 2021-08-04 DIAGNOSIS — E7800 Pure hypercholesterolemia, unspecified: Secondary | ICD-10-CM

## 2021-08-04 DIAGNOSIS — I2721 Secondary pulmonary arterial hypertension: Secondary | ICD-10-CM

## 2021-08-04 DIAGNOSIS — R748 Abnormal levels of other serum enzymes: Secondary | ICD-10-CM

## 2021-08-04 DIAGNOSIS — E1142 Type 2 diabetes mellitus with diabetic polyneuropathy: Secondary | ICD-10-CM

## 2021-08-04 DIAGNOSIS — I2510 Atherosclerotic heart disease of native coronary artery without angina pectoris: Secondary | ICD-10-CM

## 2021-08-04 DIAGNOSIS — I5042 Chronic combined systolic (congestive) and diastolic (congestive) heart failure: Secondary | ICD-10-CM

## 2021-08-04 DIAGNOSIS — N184 Chronic kidney disease, stage 4 (severe): Secondary | ICD-10-CM

## 2021-08-04 NOTE — Progress Notes (Signed)
Cardiology Office Note:    Date:  08/04/2021   ID:  [name], DOB Mar 21, 1941, MRN [id number]  PCP:  Irene Pap, PA-C  Cardiologist:  Sanda Klein, MD   Referring MD: Girtha Rm, PA-C   Chief complaint: CHF, ICD  History of Present Illness:    [name] is a 81 y.o. female with a hx of nonischemic cardiomyopathy (previous EF 35%, most recent EF 45-50%), minor nonobstructive coronary artery disease (last left heart cath July 2021), status post defibrillator (Medtronic Shelby dual-chamber, implanted 2013, Sprint [place] 6949-lead under advisory), severe pulmonary hypertension, obstructive sleep apnea, restrictive lung disease, essential hypertension, hypercholesterolemia type 2 diabetes mellitus, which is currently diet controlled.  Generally doing well.  Uses oxygen at 2 L by nasal cannula when she moves around, but not all day long.  Uses an extra dose of torsemide for excessive weight gain about 2 or 3 times a week.  Typically her weight stays in the 148-152 pound range at home (5 pounds higher on our office scale).  She has not had lower extremity edema, orthopnea, PND, palpitations, dizziness or syncope.  She does not have complaints of focal neurological symptoms or claudication.  Blood pressure has been a little high today 158/64, roughly the same at home, but she has not checked it consistently in the last few weeks. Was taking amodipine 5 mg only "as needed".  Her electrocardiogram shows atrial paced, ventricular sensed rhythm.  Interrogation of her device shows that she is in this rhythm 80% of the time, only 0.1% ventricular pacing.  She has not had any atrial fibrillation or ventricular tachycardia.  Her heart rate histograms are very flat but she is also very sedentary.  Estimated generator longevity is another 2.5 years.  Her Medtronic Evera S device from 2013 does not have OptiVol as a feature.  She was admitted for congestive heart failure with gradual  onset of hypervolemia in July 2021 (possibly related to inability to comply with CPAP due to defective equipment). She had marked hypoxemia, requiring 5-6 L/minute oxygen for quite a while despite aggressive diuresis. She underwent right heart catheterization with the following findings.  Findings:   RA = 20 RV = 72/27 PA = 79/44 (53) PCW = 18 Fick cardiac output/index = 5.35/2.9 Thermo CO/CI = 4.4/2.4 PVR = 6.5 (Fick) 8.0 (Thermo) Ao sat = 94% PA sat = 64%, 64% SVC sat = 61%   Assessment: 1. Severe PAH with normal left-sided pressures   Plan/Discussion:   There is near equalization or diastolic pressures which can reflect restrictive physiology or elevated R-sided pressure with normal left pressures. Given relatively normal PCWP - restriction felt to be less likely.   Treatment of PDE 5 inhibitors was initiated and seems to be well-tolerated and allowed additional diuresis. She was discharged to a rehab facility where she quickly reaccumulated fluid and rehospitalized. She responded to repeat treatment with diuretics. It was suspected that the sildenafil may have contributed to worsening left heart failure and this medication was then discontinued.  She is back down to the oxygen at 2 L by nasal cannula and does not appear to need to use this all the time. About a week ago she appeared to be excessively diuresed with a weight of 140 pounds and worsening renal parameters. After reduction of the diuretic her weight today is back up to 152 pounds on her home scale. She does not have any lower extremity edema today. Her lungs were clear. NYHA functional  class II exertional dyspnea. Repeatedly, it appears that the optimal "dry weight" is around 150 pounds on her home scale (about 4 lbs higher on office scale).  She has not had any problems with chest discomfort, leg edema, palpitations, orthopnea or PND. She denies dizziness or syncope.  Device download on 820/2021 shows normal ICD function  shows no recent episodes of high ventricular rate or atrial mode switch. She had an episode of paroxysmal atrial tachycardia or possibly slow atrial flutter at about 200 bpm/ventricular rate 80 bpm on May 2.  It lasted for about an hour and was asymptomatic.    High resolution chest CT repeated in July 2021 did not show evidence of fibrotic lung disease. Nevertheless, the PFTs are currently showing restrictive lung disease with FVC around 65% of predicted and, certain reduction in FEV1  Her most recent lipid parameters were excellent and her hemoglobin A1c was 6.5%, without medicines for diabetes. Her creatinine has varied recently with varying degrees of diuresis. On 8/25 creatinine was 2.38, while her baseline appears to be around 2.0. Her nephrologist is Dr. [name]. (Creatinine was as high as 3.42 during aggressive diuresis).)  Her last cardiac catheterization was in December 2015 and described "mild plaquing and no obstructive CAD" in the left main and circumflex coronary artery, while the LAD and RCA were described as normal.  At that time the EF was 35-45% by ventriculography in the left ventricular end-diastolic pressure was 30 mmHg.  The mean pulmonary artery pressure was 25 mmHg.  The pulmonary artery pressure was 90/23 mmHg (with systemic blood pressure 168/66).  Carotid duplex ultrasound performed in October 2017 showed minor plaque in both carotids and antegrade flow in both vertebral arteries.  Her device shows occasional relatively brief episodes of atrial mode switch, most commonly paroxysmal atrial tachycardia or slow atrial flutter with varying degrees of AV block. High ventricular rates are not seen. The longest episode lasted for about an hour and occurred in May 2021. Lead parameters are normal.  As mentioned her ventricular lead is a Sprint [employer] [id number] under advisory but shows no evidence of SIC.  Her cardiologist in [place] for many years was Dr. Elberta Spaniel, most recently Dr.  Barnie Alderman  She bears a diagnosis of restrictive lung disease and obstructive sleep apnea.  She is supposed to be on home oxygen.  She uses CPAP for severe obstructive sleep apnea with an apnea hypotony index of 59 and desaturations down to 80%.  Despite treatment with CPAP it has been very difficult to control her AHI.  She plans to follow-up with Dr. Halford Chessman or with Dr. Elsworth Soho in the pulmonary clinic.  The report stated that prior PFTs showed no obstruction but fairly significant restriction and reduced DLCO but repeat PFTs today were improved, CT chest was unremarkable and the feeling was that her initial PFTs may have not been accurate.  Most recent hemoglobin A1c is documented as being 6.1%, on glimepiride monotherapy.  She is not on statin therapy, but her previous notes report that she was taking atorvastatin 40 mg daily and her LDL was 36.[name]  Her baseline creatinine seems to be 1.6-1.9.  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  She had a pulmonary embolism 40 years ago.  EGD performed in 2011 showed mild erosive esophagitis and gastritis.  She was prescribed Protonix.  She has a remote history of depression for which she is not currently receiving any medications.  She has never smoked and denies  use of alcohol.     Past Medical History:  Diagnosis Date   Anemia    CAD (coronary artery disease)    Cardiac defibrillator in situ    Cataract    bilateral 2017   CHF (congestive heart failure) (HCC)    Chronic systolic heart failure (HCC)    CKD (chronic kidney disease) stage 4, GFR 15-29 ml/min (HCC)    COPD (chronic obstructive pulmonary disease) (HCC)    Dyspnea    HTN (hypertension)    Malignant neoplasm of rectum ([place]) 09/08/2017   Dr. Rollene Rotunda in [place]. Per notes she was due for colonoscopy in 12/2016   OSA on CPAP 09/08/2017   Other cardiomyopathies ([place])    Oxygen deficiency    2 liters at night   Pulmonary HTN (HCC)    Sleep apnea    uses  c-pap   Stroke [facility])    Type 2 diabetes, controlled, with peripheral neuropathy ([place])    Urge incontinence of urine     Past Surgical History:  Procedure Laterality Date   BIOPSY  03/29/2018   Procedure: BIOPSY;  Surgeon: Yetta Flock, MD;  Location: [facility];  Service: Gastroenterology;;   COLONOSCOPY     COLONOSCOPY WITH PROPOFOL N/A 03/29/2018   Procedure: COLONOSCOPY WITH PROPOFOL;  Surgeon: Yetta Flock, MD;  Location: [facility];  Service: Gastroenterology;  Laterality: N/A;   ESOPHAGOGASTRODUODENOSCOPY (EGD) WITH PROPOFOL N/A 03/29/2018   Procedure: ESOPHAGOGASTRODUODENOSCOPY (EGD) WITH PROPOFOL;  Surgeon: Yetta Flock, MD;  Location: [facility];  Service: Gastroenterology;  Laterality: N/A;   POLYPECTOMY  03/29/2018   Procedure: POLYPECTOMY;  Surgeon: Yetta Flock, MD;  Location: [facility];  Service: Gastroenterology;;   RIGHT HEART CATH N/A 02/15/2020   Procedure: RIGHT HEART CATH;  Surgeon: Jolaine Artist, MD;  Location: [facility];  Service: Cardiovascular;  Laterality: N/A;   RIGHT HEART CATH N/A 10/31/2020   Procedure: RIGHT HEART CATH;  Surgeon: Martinique, Peter M, MD;  Location: [facility];  Service: Cardiovascular;  Laterality: N/A;   TONSILLECTOMY AND ADENOIDECTOMY     age 41   TUBAL LIGATION     years ago   UPPER GASTROINTESTINAL ENDOSCOPY     UPPER GI ENDOSCOPY  11/28/2009   mild erosive esophagitis, mild erosive changes in stomach, chronic constipation    Current Medications: Current Meds  Medication Sig   Alcohol Swabs (DROPSAFE ALCOHOL PREP) 70 % PADS TEST BLOOD SUGAR ONE TIME DAILY   amLODipine (NORVASC) 10 MG tablet Take 10 mg by mouth at bedtime.   aspirin 81 MG tablet Take 81 mg by mouth at bedtime.    atorvastatin (LIPITOR) 40 MG tablet Take 1 tablet (40 mg total) by mouth daily.   Blood Glucose Calibration (TRUE METRIX LEVEL 1) Low SOLN USE AS DIRECTED WITH GLUCOSE METER   Blood Glucose Monitoring  Suppl (TRUE METRIX METER) DEVI Test daily  Pt will need a true metrix meter E11.9   carvedilol (COREG) 3.125 MG tablet Take 1 tablet (3.125 mg total) by mouth 2 (two) times daily with a meal.   cholecalciferol (VITAMIN D3) 25 MCG (1000 UNIT) tablet Take 1,000 Units by mouth daily.   ferrous sulfate 325 (65 FE) MG tablet Take 325 mg by mouth every Monday, Wednesday, and Friday.   linaclotide (LINZESS) 290 MCG CAPS capsule Take 1 capsule by mouth daily 30 minutes before breakfast.   mirabegron ER (MYRBETRIQ) 50 MG TB24 tablet TAKE 1 TABLET BY MOUTH AS NEEDED FOR  OVERACTIVE BLADDER   NONFORMULARY OR COMPOUNDED ITEM Antifungal solution: Terbinafine 3%, Fluconazole 2%, Tea Tree Oil 5%, Urea 10%, Ibuprofen 2% in DMSO suspension #80m   OXYGEN Inhale 2 L into the lungs at bedtime. Uses during the day as needed   potassium chloride (KLOR-CON) 10 MEQ tablet Take 20 mEq by mouth daily as needed. 10 mg prn with turosemide   torsemide (DEMADEX) 20 MG tablet Take 3 tablets (60 mg total) by mouth daily as needed.   TRUE METRIX BLOOD GLUCOSE TEST test strip TEST BLOOD SUGAR ONE TIME DAILY   TRUEplus Lancets 33G MISC TEST BLOOD SUGAR ONE TIME DAILY   Vitamin D, Ergocalciferol, (DRISDOL) 1.25 MG (50000 UNIT) CAPS capsule Take 50,000 Units by mouth every 7 (seven) days. Patient takes every Thursday for 8 weeks     Allergies:   Benazepril hcl, Lotrel [amlodipine besy-benazepril hcl], and Talwin [pentazocine]   Social History   Socioeconomic History   Marital status: Widowed    Spouse name: Not on file   Number of children: Not on file   Years of education: Not on file   Highest education level: Not on file  Occupational History   Not on file  Tobacco Use   Smoking status: Former    Types: Cigarettes    Quit date: 03/14/2009    Years since quitting: 12.4   Smokeless tobacco: Never   Tobacco comments:    quit smoking 2010  Vaping Use   Vaping Use: Never used  Substance and Sexual Activity   Alcohol  use: No   Drug use: No   Sexual activity: Not on file  Other Topics Concern   Not on file  Social History Narrative   Lives alone.  2 daughters and 1 granddaughter in [place]  Social Determinants of Health   Financial Resource Strain: Not on file  Food Insecurity: Not on file  Transportation Needs: No Transportation Needs   Lack of Transportation (Medical): No   Lack of Transportation (Non-Medical): No  Physical Activity: Not on file  Stress: Not on file  Social Connections: Not on file     Family History: The patient's family history is significant for a history of coronary artery disease at advanced ages.  And stroke at advanced ages ROS:   Please see the history of present illness.    All other systems are reviewed and are negative.   EKGs/Labs/Other Studies Reviewed:    The following studies were reviewed today: Comprehensive ICD check.  ECHO 02/18/2020  1. Left ventricular ejection fraction, by estimation, is 45 to 50%. The  left ventricle has mildly decreased function. The left ventricle  demonstrates regional wall motion abnormalities. The left ventricular  internal cavity size was mildly dilated. There   is the interventricular septum is flattened in systole and diastole,  consistent with right ventricular pressure and volume overload.   2. Right ventricular systolic function is moderately reduced. The right  ventricular size is moderately enlarged. There is moderately elevated  pulmonary artery systolic pressure. The estimated right ventricular  systolic pressure is 448.5mmHg.   3. Right atrial size was severely dilated.   4. The mitral valve is normal in structure. Trivial mitral valve  regurgitation.   5. The tricuspid valve is abnormal. Tricuspid valve regurgitation is  moderate to severe.   6. The aortic valve is tricuspid. Aortic valve regurgitation is not  visualized. No aortic stenosis is present.   7. The inferior vena cava is dilated in size with <  50%  respiratory  variability, suggesting right atrial pressure of 15 mmHg.  EKG:  EKG is ordered today. Atrial paced, ventricular sensed rhythm, long AV delay 224 ms, QTc 493 ms. Recent Labs: 04/16/2021: BUN 60; Creatinine, Ser 3.22; Hemoglobin 12.3; Platelets 250; Potassium 4.7; Sodium 142; TSH 2.540 07/22/2021: ALT 9  Recent Lipid Panel    Component Value Date/Time   CHOL 166 04/16/2021 1330   TRIG 58 04/16/2021 1330   HDL 103 04/16/2021 1330   CHOLHDL 1.6 04/16/2021 1330   CHOLHDL 2.3 06/25/2007 0700   VLDL 11 06/25/2007 0700   LDLCALC 51 04/16/2021 1330    Physical Exam:    VS:  BP 136/72 (BP Location: Left Arm, Patient Position: Sitting, Cuff Size: Large)    Pulse 60    Ht _0  (1.549 m)    Wt 169 lb 12.8 oz (77 kg)    SpO2 96%    BMI 32.08 kg/m     Wt Readings from Last 3 Encounters:  08/04/21 169 lb 12.8 oz (77 kg)  05/01/21 166 lb 4 oz (75.4 kg)  04/16/21 167 lb 3.2 oz (75.8 kg)     General: Alert, oriented x3, no distress, healthy ICD site Head: no evidence of trauma, PERRL, EOMI, no exophtalmos or lid lag, no myxedema, no xanthelasma; normal ears, nose and oropharynx Neck: normal jugular venous pulsations and no hepatojugular reflux; brisk carotid pulses without delay and no carotid bruits Chest: clear to auscultation, no signs of consolidation by percussion or palpation, normal fremitus, symmetrical and full respiratory excursions Cardiovascular: normal position and quality of the apical impulse, regular rhythm, normal first and second heart sounds, no murmurs, rubs or gallops Abdomen: no tenderness or distention, no masses by palpation, no abnormal pulsatility or arterial bruits, normal bowel sounds, no hepatosplenomegaly Extremities: no clubbing, cyanosis or edema; 2+ radial, ulnar and brachial pulses bilaterally; 2+ right femoral, posterior tibial and dorsalis pedis pulses; 2+ left femoral, posterior tibial and dorsalis pedis pulses; no subclavian or femoral  bruits Neurological: grossly nonfocal Psych: Normal mood and affect   ASSESSMENT:    No diagnosis found.  PLAN:    In order of problems listed above:  CHF: They are doing an excellent job with sodium restriction, weight monitoring and diuretic dose adjustment.  The dry weight is about 150 pounds 2 pounds and to achieve this she requires supplemental diuretics 2 or 3 times a week.  She has primarily diastolic dysfunction with LVEF most recently estimated be 45-50%. PAH: This is severe and largely independent of the left heart failure.  The mean PA pressure was 53 mmHg when the mean wedge pressure was 18 mmHg at the recent right heart catheterization (transpulmonary gradient 35 mmHg).  It is likely related to longstanding undertreated sleep disordered breathing.  There is no evidence of significant fibrotic lung disease on high-resolution CT, although there is some evidence of restriction by pulmonary function tests.  Reinforced importance of compliance with oxygen to avoid periods of hypoxemia that would further worsen her pulmonary hypertension. ICD: Normal device function, although her RV lead is under advisory.  No plan for revision unless actual events occur.  Does not have OptiVol.  Has never received therapies from her device.  Does not require ventricular pacing.  Continue remote downloads every 3 months. PAT: Sustained episode in May 2021 lasting for about an hour, well rate controlled and asymptomatic. None since then. On carvedilol.  True atrial fibrillation has not been reported. HTN: Less than perfect control. Amlodipine  scheduled 5 mg daily, not prn. CKD 4: Baseline creatinine now seems to be around 2.4. DM: Diet controlled.   HLP: Excellent lipid profile on current medications. Outstanding HDL of 109 CAD: Asymptomatic.  Minor plaque at her last cardiac catheterization and minor carotid plaque by ultrasound.  On low-dose aspirin. OSA: recommend 100% compliance w CPAP.  There are  no Patient Instructions on file for this visit.   Medication Adjustments/Labs and Tests Ordered: Patient Current medicines are reviewed at length with the patient today.  Concerns regarding medicines are outlined above.  No orders of the defined types were placed in this encounter.  No orders of the defined types were placed in this encounter.   Signed, Sanda Klein, MD  08/04/2021 11:35 AM    [facility]

## 2021-08-04 NOTE — Progress Notes (Signed)
Cardiology Office Note:    Date:  08/04/2021   ID:  [name], DOB May 20, 1941, MRN [id number]  PCP:  Irene Pap, PA-C  Cardiologist:  Sanda Klein, MD   Referring MD: Girtha Rm, PA-C   Chief complaint: CHF, ICD  History of Present Illness:    [name] is a 81 y.o. female with a hx of nonischemic cardiomyopathy (previous EF 35%, most recent EF 45-50%), minor nonobstructive coronary artery disease (last left heart cath July 2021), status post defibrillator (Medtronic [place] dual-chamber, implanted 2013, Sprint [place] 6949-lead under advisory), severe pulmonary hypertension, obstructive sleep apnea, restrictive lung disease, essential hypertension, hypercholesterolemia type 2 diabetes mellitus, which is currently diet controlled.  She is feeling great.  She remains quite sedentary, but is able to participate in social gatherings or go shopping without feeling shortness of breath.  Occasionally uses oxygen 2 L by nasal cannula.  Occasionally has taken extra dose of torsemide for edema.  Keeping her weight around 160-165 pounds on her home scale (5 pounds higher on our office scale).  Her typical blood pressure at home is in the 110-120/70s.  Denies orthopnea, PND, lower extremity edema, palpitations, dizziness, syncope or chest pain.  She was hospitalized with acute heart failure in the summer of 2021.  Right heart cath showed severe pulmonary artery hypertension with normal left-sided pressures and preserved cardiac output.  Treatment with sildenafil and amlodipine was stopped due to hypotension.  A follow-up right heart catheterization in April 2022 showed a mean PA pressure of 33 mmHg, normal left ventricular and right ventricular filling pressures and normal cardiac index of 2.66.  Had a period last fall when her alkaline phosphatase was up over 200, but a repeat performed this January shows it down to 139, almost normal range.  This occurred around the same time  that her and her creatinine worsened to above 3.2..  Her transaminases remain normal, as did her bilirubin.  Per her report, the creatinine is now down to 1.3.  Her nephrologist is Dr. [name] and her gastroenterologist is Dr. Havery Moros.   May have some mild ankle swelling towards the end of the day, that resolves after lying in bed overnight.  ICD interrogation shows that her Medtronic Evera S single-chamber device implanted in 2013 has about 18 more months of remaining battery.  Her device does not have OptiVol.  She has had a single episode of nonsustained VT lasting for 19 beats, that occurred in October, none since.  She has 60% atrial pacing and 0.4% ventricular pacing.  There has been no atrial fibrillation.  Lead parameters remain very good.   She was admitted for congestive heart failure with gradual onset of hypervolemia in July 2021 (possibly related to inability to comply with CPAP due to defective equipment). She had marked hypoxemia, requiring 5-6 L/minute oxygen for quite a while despite aggressive diuresis. She underwent right heart catheterization with the following findings.  Findings:   RA = 20 RV = 72/27 PA = 79/44 (53) PCW = 18 Fick cardiac output/index = 5.35/2.9 Thermo CO/CI = 4.4/2.4 PVR = 6.5 (Fick) 8.0 (Thermo) Ao sat = 94% PA sat = 64%, 64% SVC sat = 61%   Assessment: 1. Severe PAH with normal left-sided pressures   Plan/Discussion:   There is near equalization or diastolic pressures which can reflect restrictive physiology or elevated R-sided pressure with normal left pressures. Given relatively normal PCWP - restriction felt to be less likely.   Treatment of PDE 5  inhibitors was initiated and seems to be well-tolerated and allowed additional diuresis. She was discharged to a rehab facility where she quickly reaccumulated fluid and rehospitalized. She responded to repeat treatment with diuretics. It was suspected that the sildenafil may have contributed to  worsening left heart failure and this medication was then discontinued.  High resolution chest CT repeated in July 2021 did not show evidence of fibrotic lung disease. Nevertheless, the PFTs are currently showing restrictive lung disease with FVC around 65% of predicted and, certain reduction in FEV1.  Her last cardiac catheterization was in December 2015 and described "mild plaquing and no obstructive CAD" in the left main and circumflex coronary artery, while the LAD and RCA were described as normal.  At that time the EF was 35-45% by ventriculography in the left ventricular end-diastolic pressure was 30 mmHg.  The mean pulmonary artery pressure was 25 mmHg.  The pulmonary artery pressure was 90/23 mmHg (with systemic blood pressure 168/66).  Carotid duplex ultrasound performed in October 2017 showed minor plaque in both carotids and antegrade flow in both vertebral arteries.  Her device shows occasional relatively brief episodes of atrial mode switch, most commonly paroxysmal atrial tachycardia or slow atrial flutter with varying degrees of AV block. High ventricular rates are not seen. The longest episode lasted for about an hour and occurred in May 2021. Lead parameters are normal.  As mentioned her ventricular lead is a Sprint Fidelis [id number] under advisory but shows no evidence of SIC.  Her cardiologist in [place] for many years was Dr. Elberta Spaniel, most recently Dr. Barnie Alderman  She bears a diagnosis of restrictive lung disease and obstructive sleep apnea.  She is supposed to be on home oxygen.  She uses CPAP for severe obstructive sleep apnea with an apnea hypotony index of 59 and desaturations down to 80%.  Despite treatment with CPAP it has been very difficult to control her AHI.  She plans to follow-up with Dr. Halford Chessman or with Dr. Elsworth Soho in the pulmonary clinic.  The report stated that prior PFTs showed no obstruction but fairly significant restriction and reduced DLCO but repeat PFTs today were  improved, CT chest was unremarkable and the feeling was that her initial PFTs may have not been accurate.  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  She had a pulmonary embolism 40 years ago.  EGD performed in 2011 showed mild erosive esophagitis and gastritis.  She was prescribed Protonix.  She has a remote history of depression for which she is not currently receiving any medications.  She has never smoked and denies use of alcohol.     Past Medical History:  Diagnosis Date   Anemia    CAD (coronary artery disease)    Cardiac defibrillator in situ    Cataract    bilateral 2017   CHF (congestive heart failure) (HCC)    Chronic systolic heart failure (HCC)    CKD (chronic kidney disease) stage 4, GFR 15-29 ml/min (HCC)    COPD (chronic obstructive pulmonary disease) (HCC)    Dyspnea    HTN (hypertension)    Malignant neoplasm of rectum ([place]) 09/08/2017   Dr. Rollene Rotunda in [place]. Per notes she was due for colonoscopy in 12/2016   OSA on CPAP 09/08/2017   Other cardiomyopathies ([place])    Oxygen deficiency    2 liters at night   Pulmonary HTN ([place])    Sleep apnea    uses c-pap   Stroke [facility])  Type 2 diabetes, controlled, with peripheral neuropathy ([place])    Urge incontinence of urine     Past Surgical History:  Procedure Laterality Date   BIOPSY  03/29/2018   Procedure: BIOPSY;  Surgeon: Yetta Flock, MD;  Location: [facility];  Service: Gastroenterology;;   COLONOSCOPY     COLONOSCOPY WITH PROPOFOL N/A 03/29/2018   Procedure: COLONOSCOPY WITH PROPOFOL;  Surgeon: Yetta Flock, MD;  Location: [facility];  Service: Gastroenterology;  Laterality: N/A;   ESOPHAGOGASTRODUODENOSCOPY (EGD) WITH PROPOFOL N/A 03/29/2018   Procedure: ESOPHAGOGASTRODUODENOSCOPY (EGD) WITH PROPOFOL;  Surgeon: Yetta Flock, MD;  Location: [facility];  Service: Gastroenterology;  Laterality: N/A;   POLYPECTOMY  03/29/2018   Procedure: POLYPECTOMY;   Surgeon: Yetta Flock, MD;  Location: [facility];  Service: Gastroenterology;;   RIGHT HEART CATH N/A 02/15/2020   Procedure: RIGHT HEART CATH;  Surgeon: Jolaine Artist, MD;  Location: [place] [facility];  Service: Cardiovascular;  Laterality: N/A;   RIGHT HEART CATH N/A 10/31/2020   Procedure: RIGHT HEART CATH;  Surgeon: Martinique, Peter M, MD;  Location: [facility];  Service: Cardiovascular;  Laterality: N/A;   TONSILLECTOMY AND ADENOIDECTOMY     age 35   TUBAL LIGATION     years ago   UPPER GASTROINTESTINAL ENDOSCOPY     UPPER GI ENDOSCOPY  11/28/2009   mild erosive esophagitis, mild erosive changes in stomach, chronic constipation    Current Medications: Current Meds  Medication Sig   Alcohol Swabs (DROPSAFE ALCOHOL PREP) 70 % PADS TEST BLOOD SUGAR ONE TIME DAILY   amLODipine (NORVASC) 10 MG tablet Take 10 mg by mouth at bedtime.   aspirin 81 MG tablet Take 81 mg by mouth at bedtime.    atorvastatin (LIPITOR) 40 MG tablet Take 1 tablet (40 mg total) by mouth daily.   Blood Glucose Calibration (TRUE METRIX LEVEL 1) Low SOLN USE AS DIRECTED WITH GLUCOSE METER   Blood Glucose Monitoring Suppl (TRUE METRIX METER) DEVI Test daily  Pt will need a true metrix meter E11.9   carvedilol (COREG) 3.125 MG tablet Take 1 tablet (3.125 mg total) by mouth 2 (two) times daily with a meal.   cholecalciferol (VITAMIN D3) 25 MCG (1000 UNIT) tablet Take 1,000 Units by mouth daily.   ferrous sulfate 325 (65 FE) MG tablet Take 325 mg by mouth every Monday, Wednesday, and Friday.   linaclotide (LINZESS) 290 MCG CAPS capsule Take 1 capsule by mouth daily 30 minutes before breakfast.   mirabegron ER (MYRBETRIQ) 50 MG TB24 tablet TAKE 1 TABLET BY MOUTH AS NEEDED FOR OVERACTIVE BLADDER   NONFORMULARY OR COMPOUNDED ITEM Antifungal solution: Terbinafine 3%, Fluconazole 2%, Tea Tree Oil 5%, Urea 10%, Ibuprofen 2% in DMSO suspension #53m   OXYGEN Inhale 2 L into the lungs at bedtime. Uses during  the day as needed   potassium chloride (KLOR-CON) 10 MEQ tablet Take 20 mEq by mouth daily as needed. 10 mg prn with turosemide   torsemide (DEMADEX) 20 MG tablet Take 3 tablets (60 mg total) by mouth daily as needed.   TRUE METRIX BLOOD GLUCOSE TEST test strip TEST BLOOD SUGAR ONE TIME DAILY   TRUEplus Lancets 33G MISC TEST BLOOD SUGAR ONE TIME DAILY   Vitamin D, Ergocalciferol, (DRISDOL) 1.25 MG (50000 UNIT) CAPS capsule Take 50,000 Units by mouth every 7 (seven) days. Patient takes every Thursday for 8 weeks     Allergies:   Benazepril hcl, Lotrel [amlodipine besy-benazepril hcl], and Talwin [pentazocine]  Social History   Socioeconomic History   Marital status: Widowed    Spouse name: Not on file   Number of children: Not on file   Years of education: Not on file   Highest education level: Not on file  Occupational History   Not on file  Tobacco Use   Smoking status: Former    Types: Cigarettes    Quit date: 03/14/2009    Years since quitting: 12.4   Smokeless tobacco: Never   Tobacco comments:    quit smoking 2010  Vaping Use   Vaping Use: Never used  Substance and Sexual Activity   Alcohol use: No   Drug use: No   Sexual activity: Not on file  Other Topics Concern   Not on file  Social History Narrative   Lives alone.  2 daughters and 1 granddaughter in [place]   Social Determinants of Health   Financial Resource Strain: Not on file  Food Insecurity: Not on file  Transportation Needs: No Transportation Needs   Lack of Transportation (Medical): No   Lack of Transportation (Non-Medical): No  Physical Activity: Not on file  Stress: Not on file  Social Connections: Not on file     Family History: The patient's family history is significant for a history of coronary artery disease at advanced ages.  And stroke at advanced ages ROS:   Please see the history of present illness.    All other systems are reviewed and are negative.   EKGs/Labs/Other Studies Reviewed:     The following studies were reviewed today: Comprehensive ICD check.  Echocardiogram 10/16/2020    1. Left ventricular ejection fraction, by estimation, is 55 to 60%. The  left ventricle has normal function. The left ventricle has no regional  wall motion abnormalities. Left ventricular diastolic parameters are  consistent with Grade I diastolic  dysfunction (impaired relaxation). There is the interventricular septum is  flattened in systole and diastole, consistent with right ventricular  pressure and volume overload.   2. Right ventricular systolic function is normal. The right ventricular  size is normal. There is severely elevated pulmonary artery systolic  pressure. The estimated right ventricular systolic pressure is 38.1 mmHg.   3. Left atrial size was moderately dilated.   4. Right atrial size was moderately dilated.   5. The mitral valve is normal in structure. Trivial mitral valve  regurgitation. No evidence of mitral stenosis.   6. Tricuspid valve regurgitation is moderate.   7. The aortic valve is normal in structure. Aortic valve regurgitation is  not visualized. No aortic stenosis is present.   8. The inferior vena cava is normal in size with greater than 50%  respiratory variability, suggesting right atrial pressure of 3 mmHg.   Comparison(s): Prior images reviewed side by side. The left ventricular  function has improved.   Right heart catheterization 10/31/2020 Fick Cardiac Output 4.52 L/min  Fick Cardiac Output Index 2.66 (L/min)/BSA  RA A Wave 3 mmHg  RA V Wave 4 mmHg  RA Mean 1 mmHg  RV Systolic Pressure 62 mmHg  RV Diastolic Pressure -1 mmHg  RV EDP 4 mmHg  PA Systolic Pressure 63 mmHg  PA Diastolic Pressure 14 mmHg  PA Mean 33 mmHg  PW A Wave 8 mmHg  PW V Wave 9 mmHg  PW Mean 6 mmHg  QP/QS 1  TPVR Index 12.43 HRUI    EKG:  EKG is not ordered today.  ECG from 04/16/2021 shows sinus rhythm with PACs, low voltage  QRS complexes in the anterior  leads  Recent Labs: 04/16/2021: BUN 60; Creatinine, Ser 3.22; Hemoglobin 12.3; Platelets 250; Potassium 4.7; Sodium 142; TSH 2.540 07/22/2021: ALT 9  Recent Lipid Panel    Component Value Date/Time   CHOL 166 04/16/2021 1330   TRIG 58 04/16/2021 1330   HDL 103 04/16/2021 1330   CHOLHDL 1.6 04/16/2021 1330   CHOLHDL 2.3 06/25/2007 0700   VLDL 11 06/25/2007 0700   LDLCALC 51 04/16/2021 1330    Physical Exam:    VS:  BP 136/72 (BP Location: Left Arm, Patient Position: Sitting, Cuff Size: Large)    Pulse 60    Ht _0  (1.549 m)    Wt 169 lb 12.8 oz (77 kg)    SpO2 96%    BMI 32.08 kg/m     Wt Readings from Last 3 Encounters:  08/04/21 169 lb 12.8 oz (77 kg)  05/01/21 166 lb 4 oz (75.4 kg)  04/16/21 167 lb 3.2 oz (75.8 kg)    General: Alert, oriented x3, no distress, healthy ICD site left subclavian artery Head: no evidence of trauma, PERRL, EOMI, no exophtalmos or lid lag, no myxedema, no xanthelasma; normal ears, nose and oropharynx Neck: normal jugular venous pulsations and no hepatojugular reflux; brisk carotid pulses without delay and no carotid bruits Chest: clear to auscultation, no signs of consolidation by percussion or palpation, normal fremitus, symmetrical and full respiratory excursions Cardiovascular: normal position and quality of the apical impulse, regular rhythm with occasional ectopy, normal first and second heart sounds, no murmurs, rubs or gallops Abdomen: no tenderness or distention, no masses by palpation, no abnormal pulsatility or arterial bruits, normal bowel sounds, no hepatosplenomegaly Extremities: no clubbing, cyanosis or edema; 2+ radial, ulnar and brachial pulses bilaterally; 2+ right femoral, posterior tibial and dorsalis pedis pulses; 2+ left femoral, posterior tibial and dorsalis pedis pulses; no subclavian or femoral bruits Neurological: grossly nonfocal Psych: Normal mood and affect    ASSESSMENT:    1. Chronic combined systolic (congestive) and  diastolic (congestive) heart failure ([place])   2. PAH (pulmonary artery hypertension) ([place])   3. ICD (implantable cardioverter-defibrillator) in place   4. PAT (paroxysmal atrial tachycardia) ([place])   5. Essential hypertension   6. CKD (chronic kidney disease) stage 4, GFR 15-29 ml/min (HCC)   7. Type 2 diabetes, controlled, with peripheral neuropathy ([place])   8. Hypercholesterolemia   9. Coronary artery disease involving native coronary artery of native heart without angina pectoris   10. OSA on CPAP   11. Alkaline phosphatase elevation     PLAN:    In order of problems listed above:  CHF: I think Mrs. Freund has gained some true weight and that her problems with worsening renal function last fall were due to attempts at keeping her at an exceedingly low "dry weight".  Seems that on the office scale her current "dry weight" may be under 165-170 pounds.   She has primarily diastolic dysfunction with LVEF most recently estimated be 45-50%. PAH: This is moderate-severe and largely independent of the left heart failure.  At her most recent cardiac catheterization in April 2022 the mean PA pressure was 33 mmHg in the setting of a mean PAWP of only 9 mmHg (transpulmonary gradient 26 mmHg).  It is likely related to longstanding undertreated sleep disordered breathing.  There is no evidence of significant fibrotic lung disease on high-resolution CT, although there is some evidence of restriction by pulmonary function tests.  She did not do well  with PDE 5 inhibitors.icd ICD: Normal device function.  RV lead under advisory has shown excellent parameters.  No plan for lead revision unless there are complications.  Her current device does not have OptiVol.  She has never received tachyarrhythmia therapy from her device.  She does not require ventricular pacing.  When it comes time for generator change out she will be almost 81 years old.  We discussed the fact that she may decide that she does not want to have  a new ICD generator, but is premature to make a decision. PAT: None has been recorded recently.  Sustained episode in May 2021 lasting for about an hour, well rate controlled and asymptomatic. None since then. On carvedilol.  True atrial fibrillation has not been reported. HTN: Usually with excellent blood pressure control with systolic 315-945O.  No changes made to medications today. CKD 4: Had assumed that her baseline creatinine was around 2.4, but per her report it was recently down to 1.32. Almost sounds too good to be true.  We will get a copy of those labs. DM: Diet controlled.  Recent hemoglobin A1c was 6.0%. HLP: Has always had an exceptional HDL.  LDL well within guidelines at 51.  Continue same medications. CAD: As denies angina pectoris.  Ymptomatic.  Minor plaque at her last cardiac catheterization and minor carotid plaque by ultrasound.  On low-dose aspirin. OSA: recommend 100% compliance w CPAP. Abnormal alkaline phosphatase: I think it's very likely that this was due to renal dysfunction rather than biliary abnormalities.  Patient Instructions  Medication Instructions:  No changes *If you need a refill on your cardiac medications before your next appointment, please call your pharmacy*   Lab Work: None ordered If you have labs (blood work) drawn today and your tests are completely normal, you will receive your results only by: [place] (if you have MyChart) OR A paper copy in the mail If you have any lab test that is abnormal or we need to change your treatment, we will call you to review the results.   Testing/Procedures: None ordered   Follow-Up: At [facility], you and your health needs are our priority.  As part of our continuing mission to provide you with exceptional heart care, we have created designated Provider Care Teams.  These Care Teams include your primary Cardiologist (physician) and Advanced Practice Providers (APPs -  Physician Assistants and  Nurse Practitioners) who all work together to provide you with the care you need, when you need it.  We recommend signing up for the patient portal called "MyChart".  Sign up information is provided on this After Visit Summary.  MyChart is used to connect with patients for Virtual Visits (Telemedicine).  Patients are able to view lab/test results, encounter notes, upcoming appointments, etc.  Non-urgent messages can be sent to your provider as well.   To learn more about what you can do with MyChart, go to [email or web address].    Your next appointment:   12 month(s)  The format for your next appointment:   In Person  Provider:   Sanda Klein, MD        Medication Adjustments/Labs and Tests Ordered: Patient Current medicines are reviewed at length with the patient today.  Concerns regarding medicines are outlined above.  No orders of the defined types were placed in this encounter.  No orders of the defined types were placed in this encounter.   Signed, Sanda Klein, MD  08/04/2021 2:57 PM    [facility]

## 2021-08-04 NOTE — Patient Instructions (Signed)

## 2021-08-16 LAB — SPECIMEN STATUS REPORT

## 2021-08-16 LAB — GAMMA GT: GGT: 18 IU/L (ref 0–60)

## 2021-09-03 ENCOUNTER — Other Ambulatory Visit: Payer: Self-pay | Admitting: Cardiovascular Disease

## 2021-09-03 ENCOUNTER — Ambulatory Visit (INDEPENDENT_AMBULATORY_CARE_PROVIDER_SITE_OTHER): Payer: Medicare PPO

## 2021-09-03 DIAGNOSIS — Z95810 Presence of automatic (implantable) cardiac defibrillator: Secondary | ICD-10-CM

## 2021-09-03 LAB — CUP PACEART REMOTE DEVICE CHECK
Battery Remaining Longevity: 16 mo
Battery Voltage: 2.9 V
Brady Statistic AP VP Percent: 0.14 %
Brady Statistic AP VS Percent: 50.22 %
Brady Statistic AS VP Percent: 0.05 %
Brady Statistic AS VS Percent: 49.59 %
Brady Statistic RA Percent Paced: 49.77 %
Brady Statistic RV Percent Paced: 0.22 %
Date Time Interrogation Session: 20230215033424
HighPow Impedance: 42 Ohm
HighPow Impedance: 53 Ohm
Implantable Pulse Generator Implant Date: 20131120
Lead Channel Impedance Value: 266 Ohm
Lead Channel Impedance Value: 342 Ohm
Lead Channel Impedance Value: 380 Ohm
Lead Channel Pacing Threshold Amplitude: 0.75 V
Lead Channel Pacing Threshold Amplitude: 0.875 V
Lead Channel Pacing Threshold Pulse Width: 0.4 ms
Lead Channel Pacing Threshold Pulse Width: 0.4 ms
Lead Channel Sensing Intrinsic Amplitude: 0.625 mV
Lead Channel Sensing Intrinsic Amplitude: 0.625 mV
Lead Channel Sensing Intrinsic Amplitude: 3 mV
Lead Channel Sensing Intrinsic Amplitude: 3 mV
Lead Channel Setting Pacing Amplitude: 1.5 V
Lead Channel Setting Pacing Amplitude: 2 V
Lead Channel Setting Pacing Pulse Width: 0.4 ms
Lead Channel Setting Sensing Sensitivity: 0.3 mV

## 2021-09-08 ENCOUNTER — Other Ambulatory Visit: Payer: Self-pay

## 2021-09-08 NOTE — Patient Instructions (Signed)
Patient instructions Take all medications as prescribed Attend all scheduled provider appointments call office if I gain more than 2 pounds in one day or 5 pounds in one week keep legs up while sitting track weight in diary use salt in moderation watch for swelling in feet, ankles and legs every day weigh myself daily follow rescue plan if symptoms flare-up

## 2021-09-08 NOTE — Patient Outreach (Signed)
[facility]) Care Management Telephonic RN Care Manager Note   09/08/2021 Name:  [name] MRN:  [id number] DOB:  1941/05/08  Summary: Telephone call to patient to patient for Heart Failure follow up. Paitent reports she is doing well.  No changes in condition.    Recommendations/Changes made from today's visit: Recommended continued weights and limiting salt intake and notifying physician for any changes.    Subjective: [name] is an 81 y.o. year old female who is a primary patient of Marcellina Millin. The care management team was consulted for assistance with care management and/or care coordination needs.    Telephonic RN Care Manager completed Telephone Visit today.  Objective:   Medications Reviewed Today     Reviewed by Orma Render, CMA (Certified Medical Assistant) on 08/04/21 at 1128  Med List Status: <None>   Medication Order Taking? Sig Documenting Provider Last Dose Status Informant  Alcohol Swabs (DROPSAFE ALCOHOL PREP) 70 % PADS [id number] Yes TEST BLOOD SUGAR ONE TIME DAILY Henson, Vickie L, PA-C Taking Active   amLODipine (NORVASC) 10 MG tablet [id number] Yes Take 10 mg by mouth at bedtime. [provider] Taking Active   aspirin 81 MG tablet [id number] Yes Take 81 mg by mouth at bedtime.  [provider] Taking Active Self  atorvastatin (LIPITOR) 40 MG tablet [id number] Yes Take 1 tablet (40 mg total) by mouth daily. Denita Lung, MD Taking Active   Blood Glucose Calibration (TRUE METRIX LEVEL 1) Low SOLN [id number] Yes USE AS DIRECTED WITH GLUCOSE METER Henson, Vickie L, PA-C Taking Active   Blood Glucose Monitoring Suppl (TRUE METRIX METER) DEVI [id number] Yes Test daily  Pt will need a true metrix meter E11.9 Henson, Vickie L, PA-C Taking Active Self  carvedilol (COREG) 3.125 MG tablet [id number] Yes Take 1 tablet (3.125 mg total) by mouth 2 (two) times daily with a meal. Croitoru, Mihai, MD Taking Active    cholecalciferol (VITAMIN D3) 25 MCG (1000 UNIT) tablet [id number] Yes Take 1,000 Units by mouth daily. [provider] Taking Active Self           Med Note Mauri Reading Aug 04, 2021 11:24 AM) Patient takes 2000 units daily  ferrous sulfate 325 (65 FE) MG tablet [id number] Yes Take 325 mg by mouth every Monday, Wednesday, and Friday. [provider] Taking Active Self  linaclotide Rolan Lipa) 290 MCG CAPS capsule [id number] Yes Take 1 capsule by mouth daily 30 minutes before breakfast. Noralyn Pick, NP Taking Active   mirabegron ER (MYRBETRIQ) 50 MG TB24 tablet [id number] Yes TAKE 1 TABLET BY MOUTH AS NEEDED FOR OVERACTIVE BLADDER Carlena Hurl, PA-C Taking Active   NONFORMULARY OR COMPOUNDED ITEM [id number] Yes Antifungal solution: Terbinafine 3%, Fluconazole 2%, Tea Tree Oil 5%, Urea 10%, Ibuprofen 2% in DMSO suspension #19m GMarzetta Board DPM Taking Active Self  OXYGEN [id number] Inhale 2 L into the lungs at bedtime. Uses during the day as needed [provider] Taking Active Self  potassium chloride (KLOR-CON) 10 MEQ tablet [id number] Take 20 mEq by mouth daily as needed. 10 mg prn with turosemide [provider] Taking Active   torsemide (DEMADEX) 20 MG tablet [id number] Take 3 tablets (60 mg total) by mouth daily as needed. Croitoru, Mihai, MD Taking Active   TRUE METRIX BLOOD GLUCOSE TEST test strip [id number] TEST BLOOD SUGAR ONE TIME DAILY Henson, Vickie L, PA-C Taking Active   TRUEplus Lancets  33G MISC [id number] Yes TEST BLOOD SUGAR ONE TIME DAILY Henson, Vickie L, PA-C Taking Active   Vitamin D, Ergocalciferol, (DRISDOL) 1.25 MG (50000 UNIT) CAPS capsule [id number] Yes Take 50,000 Units by mouth every 7 (seven) days. Patient takes every Thursday for 8 weeks [provider] Taking Active              SDOH:  (Social Determinants of Health) assessments and interventions performed:  SDOH Interventions     Flowsheet Row Most Recent Value  SDOH Interventions   Financial Strain Interventions Intervention Not Indicated        Care Plan  Review of patient past medical history, allergies, medications, health status, including review of consultants reports, laboratory and other test data, was performed as part of comprehensive evaluation for care management services.   Care Plan : Heart Failure (Adult)  Updates made by Jon Billings, RN since 09/08/2021 12:00 AM  Completed 09/08/2021   Problem: Symptom Exacerbation (Heart Failure) Resolved 09/08/2021     Long-Range Goal: Symptom Exacerbation Prevented or Minimized as evidenced by no acute episode of heart failure Completed 09/08/2021  Start Date: 04/23/2020  Expected End Date: 07/19/2022  This Visit's Progress: On track  Recent Progress: On track  Priority: High  Note:   Establish a mutually-agreed-upon early intervention process to communicate with primary care provider when signs/symptoms worsen.    Notes: 10/29/20 Patient follows parameters if weight about 151 lbs.    6.8.22 Patient weight 150 lbs this am. Patient has parameters of 149-151 lbs.  Knows when to take extra lasix.   Discussed Heart Failure Management Please weight daily or as ordered by your doctor Report to your doctor weight gain of 2 -3 pounds in a day or 5 pounds in a week Limit salt intake Monitor for shortness of breath, swelling of feet, ankles or abdomen and weight gain 02/26/21 Patient last weight 150 lbs. Patient weighing daily.  Patient has used as needed lasix dose about 2 a month after eating out.  Heart Failure Management reiterated.  09/08/21 Patient continues to do well.  Last weight 150 lbs.  No concerns.       Task: Identify and Minimize Risk of Heart Failure Exacerbation Completed 09/08/2021  Due Date: 07/19/2022  Priority: Routine  Responsible User: Jon Billings, RN  Note:   Care Management Activities:    - depression screen reviewed -  healthy lifestyle promoted - rescue (action) plan reviewed    Notes: 08/27/20 Patient knows weight parameters of when to take extra lasix and potassium. Patient weight 151.2 lbs  10/29/20 Reiterated the importance of following parameters, limiting salt intake. 12/25/20 weight 150 lbs 02/26/21 Patient last weight 150 lbs. Patient weighing daily.  Patient has used as needed lasix dose about 2 a month after eating out.  Heart Failure Management reiterated. 09/08/21 Patient doing well. No problems reported    Care Plan : RN Care Manager Plan of Care  Updates made by Jon Billings, RN since 09/08/2021 12:00 AM     Problem: Chronic Disease Management and Care Coordination Needs of Heart Failure   Priority: High     Long-Range Goal: Development of Plan of Care for the Management of Heart Failure   Start Date: 09/08/2021  Expected End Date: 07/19/2022  Priority: High  Note:   Current Barriers:  Chronic Disease Management support and education needs related to CHF   RNCM Clinical Goal(s):  Patient will verbalize understanding of plan for management of CHF as evidenced by reports by  continued daily weights and self report of limiting salt intake take all medications exactly as prescribed and will call provider for medication related questions as evidenced by patient reporting taking medications as prescribed demonstrate ongoing self health care management ability as  it related to heart failure as evidenced by patient reports during  through collaboration with RN Care manager, provider, and care team.   Interventions: Patient continuing to follow Heart failure plan Inter-disciplinary care team collaboration (see longitudinal plan of care) Evaluation of current treatment plan related to  self management and patient's adherence to plan as established by provider   Heart Failure Interventions:  (Status:  New goal.) Long Term Goal Provided education on low sodium diet Discussed importance of daily  weight and advised patient to weigh and record daily Reviewed role of diuretics in prevention of fluid overload and management of heart failure; Discussed the importance of keeping all appointments with provider  Patient Goals/Self-Care Activities: Take all medications as prescribed Attend all scheduled provider appointments call office if I gain more than 2 pounds in one day or 5 pounds in one week keep legs up while sitting track weight in diary use salt in moderation watch for swelling in feet, ankles and legs every day weigh myself daily follow rescue plan if symptoms flare-up  Follow Up Plan:  Telephone follow up appointment with care management team member scheduled for:  May       Plan:  Telephone follow up appointment with care management team member scheduled for:  May Patient agrees to follow-up and careplan.   [name], RN, MSN [facility] Care Management Care Management Coordinator Direct Line [phone] Toll Free: [phone]  Fax: [phone]

## 2021-09-09 NOTE — Progress Notes (Signed)
Remote ICD transmission.

## 2021-09-12 ENCOUNTER — Encounter: Payer: Self-pay | Admitting: Podiatry

## 2021-09-12 ENCOUNTER — Ambulatory Visit (INDEPENDENT_AMBULATORY_CARE_PROVIDER_SITE_OTHER): Payer: Medicare PPO | Admitting: Podiatry

## 2021-09-12 ENCOUNTER — Other Ambulatory Visit: Payer: Medicare PPO

## 2021-09-12 ENCOUNTER — Other Ambulatory Visit: Payer: Self-pay

## 2021-09-12 DIAGNOSIS — M79674 Pain in right toe(s): Secondary | ICD-10-CM | POA: Diagnosis not present

## 2021-09-12 DIAGNOSIS — E1142 Type 2 diabetes mellitus with diabetic polyneuropathy: Secondary | ICD-10-CM | POA: Diagnosis not present

## 2021-09-12 DIAGNOSIS — M79675 Pain in left toe(s): Secondary | ICD-10-CM | POA: Diagnosis not present

## 2021-09-12 DIAGNOSIS — L84 Corns and callosities: Secondary | ICD-10-CM | POA: Diagnosis not present

## 2021-09-12 DIAGNOSIS — B351 Tinea unguium: Secondary | ICD-10-CM | POA: Diagnosis not present

## 2021-09-15 ENCOUNTER — Ambulatory Visit: Payer: Medicare PPO | Admitting: Podiatry

## 2021-09-18 NOTE — Progress Notes (Signed)
Subjective:  Patient ID: [name], female    DOB: 1941-03-07,  MRN: [id number]  HOLLE SPRICK presents to clinic today for at risk foot care with history of diabetic neuropathy and callus(es) plantar aspect of both feet and painful thick toenails that are difficult to trim. Painful toenails interfere with ambulation. Aggravating factors include wearing enclosed shoe gear. Pain is relieved with periodic professional debridement. Painful calluses are aggravated when weightbearing with and without shoegear. Pain is relieved with periodic professional debridement.  Patient states blood glucose was 100 mg/dl today.    Her daughter, [name], is present during today's visit. They voice no new pedal concerns on today's visit.  PCP is Marcellina Millin , and last visit was September, 2022.  Allergies  Allergen Reactions   Benazepril Hcl Other (See Comments)    Unknown   Lotrel [Amlodipine Besy-Benazepril Hcl] Other (See Comments)    Hypotension    Talwin [Pentazocine] Other (See Comments)    Hallucinations      Review of Systems: Negative except as noted in the HPI. Objective:   Constitutional OMARI KOSLOSKY is a pleasant 81 y.o. African American female, WD, WN in NAD. AAO x 3.   Vascular CFT <3 seconds b/l LE. Faintly palpable DP pulses b/l LE. Faintly palpable PT pulse(s) b/l LE. Pedal hair absent. No pain with calf compression b/l. Lower extremity skin temperature gradient within normal limits. No edema noted b/l LE. No ischemia or gangrene noted b/l LE. No cyanosis or clubbing noted b/l LE.  Neurologic Normal speech. Oriented to person, place, and time. Protective sensation decreased with 10 gram monofilament b/l. Vibratory sensation intact b/l.  Dermatologic Pedal skin is warm and supple b/l LE. No open wounds b/l LE. No interdigital macerations noted b/l LE. Toenails 1-5 b/l elongated, discolored, dystrophic, thickened, crumbly with subungual debris and tenderness to dorsal  palpation. Hyperkeratotic lesion(s) submet head 5 left foot and submet head 5 right foot.  No erythema, no edema, no drainage, no fluctuance.  Orthopedic: Muscle strength 5/5 to all lower extremity muscle groups bilaterally. 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 Patient ambulates independent of any assistive aids.   Radiographs: None  Last A1c:  Hemoglobin A1C Latest Ref Rng & Units 04/16/2021 10/16/2020  HGBA1C 4.8 - 5.6 % 6.0(H) 5.6  Some recent data might be hidden   Assessment:   1. Pain due to onychomycosis of toenails of both feet   2. Callus   3. Diabetic peripheral neuropathy associated with type 2 diabetes mellitus ([place])    Plan:  Patient was evaluated and treated and all questions answered. Consent given for treatment as described below: -Toenails 1-5 b/l were debrided in length and girth with sterile nail nippers and dremel without iatrogenic bleeding.  -Callus(es) submet head 5 left foot and submet head 5 right foot pared utilizing sterile scalpel blade without complication or incident. Total number debrided =2. -Patient/POA to call should there be question/concern in the interim.  Return in about 3 months (around 12/10/2021).  Marzetta Board, DPM

## 2021-10-16 NOTE — Progress Notes (Signed)
? ?Established Patient Office Visit ? ?Subjective:  ?Patient ID: [name], female    DOB: 26-Jan-1941  Age: 81 y.o. MRN: [id number] ? ?CC:  ?Chief Complaint  ?Patient presents with  ? Follow-up  ?  6 month follow up from last visit with Vickie. No other concerns  ? ? ?HPI ?[name] presents for a follow up appointment; denies any new issues and states she doesn't need any medication refills. Is tolerating her medicines well.  ? ?Patient Care Team: ?Marcellina Millin as PCP - General (Physician Assistant) ?Croitoru, Dani Gobble, MD as PCP - Cardiology (Cardiology) ?Armbruster, Carlota Raspberry, MD as Consulting Physician (Gastroenterology) ?Rosita Fire, MD as Consulting Physician (Nephrology) ?Jon Billings, RN as [place] Management ?Marzetta Board, DPM as Consulting Physician (Podiatry) ?[name], MD as Consulting Physician (Pulmonary Disease) ? ? ?Outpatient Medications Prior to Visit  ?Medication Sig Dispense Refill  ? Alcohol Swabs (DROPSAFE ALCOHOL PREP) 70 % PADS TEST BLOOD SUGAR ONE TIME DAILY 100 each 1  ? amLODipine (NORVASC) 10 MG tablet Take 10 mg by mouth at bedtime.    ? aspirin 81 MG tablet Take 81 mg by mouth at bedtime.     ? atorvastatin (LIPITOR) 40 MG tablet Take 1 tablet (40 mg total) by mouth daily. 90 tablet 1  ? Blood Glucose Calibration (TRUE METRIX LEVEL 1) Low SOLN USE AS DIRECTED WITH GLUCOSE METER 1 each 0  ? Blood Glucose Monitoring Suppl (TRUE METRIX METER) DEVI Test daily  Pt will need a true metrix meter E11.9 1 each 0  ? carvedilol (COREG) 3.125 MG tablet TAKE 1 TABLET TWICE DAILY WITH MEALS 180 tablet 3  ? cholecalciferol (VITAMIN D3) 25 MCG (1000 UNIT) tablet Take 1,000 Units by mouth daily.    ? ferrous sulfate 325 (65 FE) MG tablet Take 325 mg by mouth every Monday, Wednesday, and Friday.    ? linaclotide (LINZESS) 290 MCG CAPS capsule Take 1 capsule by mouth daily 30 minutes before breakfast. 30 capsule 2  ? mirabegron ER (MYRBETRIQ) 50  MG TB24 tablet TAKE 1 TABLET BY MOUTH AS NEEDED FOR OVERACTIVE BLADDER 90 tablet 0  ? OXYGEN Inhale 2 L into the lungs at bedtime. Uses during the day as needed    ? potassium chloride (KLOR-CON M) 10 MEQ tablet Take by mouth.    ? potassium chloride (KLOR-CON) 10 MEQ tablet Take 20 mEq by mouth daily as needed. 10 mg prn with turosemide    ? torsemide (DEMADEX) 20 MG tablet Take 3 tablets (60 mg total) by mouth daily as needed. 90 tablet 3  ? TRUE METRIX BLOOD GLUCOSE TEST test strip TEST BLOOD SUGAR ONE TIME DAILY 100 strip 0  ? TRUEplus Lancets 33G MISC TEST BLOOD SUGAR ONE TIME DAILY 100 each 1  ? Vitamin D, Ergocalciferol, (DRISDOL) 1.25 MG (50000 UNIT) CAPS capsule Take 50,000 Units by mouth every 7 (seven) days. Patient takes every Thursday for 8 weeks    ? Cholecalciferol (VITAMIN D3) 1.25 MG (50000 UT) CAPS Take by mouth.    ? NONFORMULARY OR COMPOUNDED ITEM Antifungal solution: Terbinafine 3%, Fluconazole 2%, Tea Tree Oil 5%, Urea 10%, Ibuprofen 2% in DMSO suspension #90m 1 each 3  ? ?No facility-administered medications prior to visit.  ? ? ?Allergies  ?Allergen Reactions  ? Benazepril Hcl Other (See Comments)  ?  Unknown  ? Lotrel [Amlodipine Besy-Benazepril Hcl] Other (See Comments)  ?  Hypotension ?  ? Talwin [Pentazocine] Other (See  Comments)  ?  Hallucinations    ? ? ?ROS ?Review of Systems  ?Constitutional:  Negative for activity change and chills.  ?HENT:  Negative for congestion and voice change.   ?Eyes:  Negative for pain and redness.  ?Respiratory:  Negative for cough and wheezing.   ?Cardiovascular:  Negative for chest pain.  ?Gastrointestinal:  Negative for constipation, diarrhea, nausea and vomiting.  ?Endocrine: Negative for polyuria.  ?Genitourinary:  Negative for frequency.  ?Skin:  Negative for color change and rash.  ?Allergic/Immunologic: Negative for immunocompromised state.  ?Neurological:  Negative for dizziness.  ?Psychiatric/Behavioral:  Negative for agitation.   ? ?  ?Objective:   ?  ?Physical Exam ?Vitals and nursing note reviewed.  ?Constitutional:   ?   General: She is not in acute distress. ?   Appearance: She is normal weight. She is not ill-appearing.  ?HENT:  ?   Head: Normocephalic and atraumatic.  ?   Right Ear: External ear normal.  ?   Left Ear: External ear normal.  ?Eyes:  ?   Extraocular Movements: Extraocular movements intact.  ?   Conjunctiva/sclera: Conjunctivae normal.  ?   Pupils: Pupils are equal, round, and reactive to light.  ?Cardiovascular:  ?   Rate and Rhythm: Normal rate and regular rhythm.  ?Pulmonary:  ?   Effort: Pulmonary effort is normal.  ?   Breath sounds: Normal breath sounds. No wheezing.  ?Abdominal:  ?   General: Bowel sounds are normal.  ?   Palpations: Abdomen is soft.  ?Musculoskeletal:     ?   General: Normal range of motion.  ?   Cervical back: Normal range of motion.  ?Skin: ?   General: Skin is warm and dry.  ?Neurological:  ?   Mental Status: She is alert and oriented to person, place, and time.  ?Psychiatric:     ?   Mood and Affect: Mood normal.  ? ? ?BP 130/70   Pulse 65   Wt 172 lb 9.6 oz (78.3 kg)   SpO2 96%   BMI 32.61 kg/m?  ? ?Wt Readings from Last 3 Encounters:  ?10/17/21 172 lb 9.6 oz (78.3 kg)  ?08/04/21 169 lb 12.8 oz (77 kg)  ?05/01/21 166 lb 4 oz (75.4 kg)  ? ? ?Results for orders placed or performed in visit on 09/03/21  ?CUP PACEART REMOTE DEVICE CHECK  ?Result Value Ref Range  ? Date Time Interrogation Session [id number]   ? Pulse Insurance claims handler MERM   ? Pulse Gen Model [id number] Evera S DR   ? Pulse Gen Serial Number [id number]   ? Clinic Name [facility]   ? Implantable Pulse Generator Type Implantable Cardiac Defibulator   ? Implantable Pulse Generator Implant Date [id number]   ? Lead Channel Setting Sensing Sensitivity 0.3 mV  ? Lead Channel Setting Pacing Amplitude 1.5 V  ? Lead Channel Setting Pacing Pulse Width 0.4 ms  ? Lead Channel Setting Pacing Amplitude 2 V  ? Lead Channel Impedance Value 342 ohm  ?  Lead Channel Sensing Intrinsic Amplitude 0.625 mV  ? Lead Channel Sensing Intrinsic Amplitude 0.625 mV  ? Lead Channel Pacing Threshold Amplitude 0.75 V  ? Lead Channel Pacing Threshold Pulse Width 0.4 ms  ? Lead Channel Impedance Value 380 ohm  ? Lead Channel Impedance Value 266 ohm  ? Lead Channel Sensing Intrinsic Amplitude 3 mV  ? Lead Channel Sensing Intrinsic Amplitude 3 mV  ? Lead Channel Pacing Threshold Amplitude 0.875 V  ? Lead  Channel Pacing Threshold Pulse Width 0.4 ms  ? HighPow Impedance 42 ohm  ? HighPow Impedance 53 ohm  ? Battery Status OK   ? Battery Remaining Longevity 16 mo  ? Battery Voltage 2.90 V  ? Loletha Grayer Statistic RA Percent Paced 49.77 %  ? Brady Statistic RV Percent Paced 0.22 %  ? Brady Statistic AP VP Percent 0.14 %  ? Brady Statistic AS VP Percent 0.05 %  ? Brady Statistic AP VS Percent 50.22 %  ? Brady Statistic AS VS Percent 49.59 %  ?  ? ?Last CBC ?Lab Results  ?Component Value Date  ? WBC 5.1 04/16/2021  ? HGB 12.3 04/16/2021  ? HCT 36.5 04/16/2021  ? MCV 83 04/16/2021  ? MCH 28.0 04/16/2021  ? RDW 13.1 04/16/2021  ? PLT 250 04/16/2021  ? ?Last metabolic panel ?Lab Results  ?Component Value Date  ? GLUCOSE 112 (H) 04/16/2021  ? NA 142 04/16/2021  ? K 4.7 04/16/2021  ? CL 97 04/16/2021  ? CO2 25 04/16/2021  ? BUN 60 (H) 04/16/2021  ? CREATININE 3.22 (H) 04/16/2021  ? EGFR 14 (L) 04/16/2021  ? CALCIUM 9.8 04/16/2021  ? PHOS 5.8 (H) 03/04/2020  ? PROT 7.1 07/22/2021  ? ALBUMIN 4.7 (H) 07/22/2021  ? LABGLOB 3.0 04/16/2021  ? AGRATIO 1.6 04/16/2021  ? BILITOT 0.4 07/22/2021  ? ALKPHOS 139 (H) 07/22/2021  ? AST 19 07/22/2021  ? ALT 9 07/22/2021  ? ANIONGAP 8 10/16/2020  ? ?Last lipids ?Lab Results  ?Component Value Date  ? CHOL 166 04/16/2021  ? HDL 103 04/16/2021  ? [place] 51 04/16/2021  ? TRIG 58 04/16/2021  ? CHOLHDL 1.6 04/16/2021  ? ?Last hemoglobin A1c ?Lab Results  ?Component Value Date  ? HGBA1C 6.0 (H) 04/16/2021  ? ?  ? ?The ASCVD Risk score (Arnett DK, et al., 2019) failed to  calculate for the following reasons: ?  The 2019 ASCVD risk score is only valid for ages 64 to 81 ? ?  ?Assessment & Plan:  ? ?Problem List Items Addressed This Visit   ? ?  ? Cardiovascular and Mediastinum

## 2021-10-17 ENCOUNTER — Encounter: Payer: Self-pay | Admitting: Physician Assistant

## 2021-10-17 ENCOUNTER — Ambulatory Visit (INDEPENDENT_AMBULATORY_CARE_PROVIDER_SITE_OTHER): Payer: Medicare PPO | Admitting: Physician Assistant

## 2021-10-17 VITALS — BP 130/70 | HR 65 | Ht 61.0 in | Wt 172.6 lb

## 2021-10-17 DIAGNOSIS — N3941 Urge incontinence: Secondary | ICD-10-CM

## 2021-10-17 DIAGNOSIS — I10 Essential (primary) hypertension: Secondary | ICD-10-CM

## 2021-10-17 DIAGNOSIS — E782 Mixed hyperlipidemia: Secondary | ICD-10-CM | POA: Insufficient documentation

## 2021-10-17 DIAGNOSIS — N184 Chronic kidney disease, stage 4 (severe): Secondary | ICD-10-CM

## 2021-10-17 DIAGNOSIS — E1142 Type 2 diabetes mellitus with diabetic polyneuropathy: Secondary | ICD-10-CM

## 2021-10-17 NOTE — Assessment & Plan Note (Signed)
controlled, eat a low fat diet, increase fiber intake (Benefiber or Metamucil, Cherrios,  oatmeal, beans, nuts, fruits and vegetables), limit saturated fats (in fried foods, red meat), can add OTC fish oil supplement, eat fish with Omega-3 fatty acids like salmon and tuna, exercise for 30 minutes 3 - 5 times a week, drink 8 - 10 glasses of water a day ? ? ?

## 2021-10-17 NOTE — Assessment & Plan Note (Signed)
stable, continue current management, avoid nephrotoxic medicines like NSAIDS (Advil, Motrin, Ibuprofen, Aleve, Naproxen) ? ?

## 2021-10-17 NOTE — Assessment & Plan Note (Signed)
controlled, eat a low sugar diet, avoid starchy food with a lot of carbohydrates, avoid fried and processed foods; last hgb a1c 6.0 on 04/16/2021 ?

## 2021-10-17 NOTE — Assessment & Plan Note (Signed)
controlled, eat a low salt diet, do not add any salt to food when cooking, avoid processed foods, avoid fried foods ? ?

## 2021-10-17 NOTE — Assessment & Plan Note (Signed)
Stable, will monitor, tolerating myrbetriq ?

## 2021-10-18 LAB — COMPREHENSIVE METABOLIC PANEL
ALT: 5 IU/L (ref 0–32)
AST: 16 IU/L (ref 0–40)
Albumin/Globulin Ratio: 1.5 (ref 1.2–2.2)
Albumin: 4.7 g/dL — ABNORMAL HIGH (ref 3.6–4.6)
Alkaline Phosphatase: 131 IU/L — ABNORMAL HIGH (ref 44–121)
BUN/Creatinine Ratio: 24 (ref 12–28)
BUN: 39 mg/dL — ABNORMAL HIGH (ref 8–27)
Bilirubin Total: 0.4 mg/dL (ref 0.0–1.2)
CO2: 24 mmol/L (ref 20–29)
Calcium: 9.8 mg/dL (ref 8.7–10.3)
Chloride: 106 mmol/L (ref 96–106)
Creatinine, Ser: 1.61 mg/dL — ABNORMAL HIGH (ref 0.57–1.00)
Globulin, Total: 3.1 g/dL (ref 1.5–4.5)
Glucose: 100 mg/dL — ABNORMAL HIGH (ref 70–99)
Potassium: 4.5 mmol/L (ref 3.5–5.2)
Sodium: 145 mmol/L — ABNORMAL HIGH (ref 134–144)
Total Protein: 7.8 g/dL (ref 6.0–8.5)
eGFR: 32 mL/min/{1.73_m2} — ABNORMAL LOW (ref >59)

## 2021-10-18 LAB — HEMOGLOBIN A1C
Est. average glucose Bld gHb Est-mCnc: 128 mg/dL
Hgb A1c MFr Bld: 6.1 % — ABNORMAL HIGH (ref 4.8–5.6)

## 2021-10-18 LAB — MICROALBUMIN / CREATININE URINE RATIO
Creatinine, Urine: 86.5 mg/dL
Microalb/Creat Ratio: 51 mg/g creat — ABNORMAL HIGH (ref 0–29)
Microalbumin, Urine: 44.4 ug/mL

## 2021-10-20 ENCOUNTER — Other Ambulatory Visit: Payer: Self-pay | Admitting: Family Medicine

## 2021-11-19 ENCOUNTER — Other Ambulatory Visit: Payer: Self-pay

## 2021-11-25 ENCOUNTER — Other Ambulatory Visit: Payer: Self-pay

## 2021-11-25 NOTE — Patient Outreach (Signed)
[facility]) Care Management ? ?11/25/2021 ? ?[name] ?12/27/1940 ?[id number] ? ? ?Telephone call to patient for disease management follow up.  Patient reports she is doing excellent.  Weight 151 lbs.  Encouraged continued heart failure management.  No concerns. ? ?Care Plan : RN Care Manager Plan of Care  ?Updates made by Jon Billings, RN since 11/25/2021 12:00 AM  ?  ? ?Problem: Chronic Disease Management and Care Coordination Needs of Heart Failure   ?Priority: High  ?  ? ?Long-Range Goal: Development of Plan of Care for the Management of Heart Failure   ?Start Date: 09/08/2021  ?Expected End Date: 07/19/2022  ?This Visit's Progress: On track  ?Priority: High  ?Note:   ?Current Barriers:  ?Chronic Disease Management support and education needs related to CHF  ? ?RNCM Clinical Goal(s):  ?Patient will verbalize understanding of plan for management of CHF as evidenced by reports by continued daily weights and self report of limiting salt intake ?take all medications exactly as prescribed and will call provider for medication related questions as evidenced by patient reporting taking medications as prescribed ?demonstrate ongoing self health care management ability as  it related to heart failure as evidenced by patient reports during  through collaboration with RN Care manager, provider, and care team.  ? ?Interventions: ?Patient continuing to follow Heart failure plan ?Inter-disciplinary care team collaboration (see longitudinal plan of care) ?Evaluation of current treatment plan related to  self management and patient's adherence to plan as established by provider ? ? ?Heart Failure Interventions:  (Status:  New goal.) Long Term Goal ?Provided education on low sodium diet ?Discussed importance of daily weight and advised patient to weigh and record daily ?Reviewed role of diuretics in prevention of fluid overload and management of heart failure; ?Discussed the importance of keeping all  appointments with provider ? ?11/25/21 Patient reports doing excellent.  Weight 151 lbs.  She reports only taking extra lasix about twice in the last month.  Discussed heart failure management.  No concerns.   ? ? ?Patient Goals/Self-Care Activities: Heart Failure ?Take all medications as prescribed ?Attend all scheduled provider appointments ?call office if I gain more than 2 pounds in one day or 5 pounds in one week ?keep legs up while sitting ?track weight in diary ?use salt in moderation ?watch for swelling in feet, ankles and legs every day ?weigh myself daily ?follow rescue plan if symptoms flare-up ? ?Follow Up Plan:  Telephone follow up appointment with care management team member scheduled for:  August ?The patient has been provided with contact information for the care management team and has been advised to call with any health related questions or concerns.  ? ?  ? ?Plan: Follow-up: Patient agrees to Care Plan and Follow-up. ?Follow-up in August.   ? ?[name], RN, MSN ?[facility] Care Management ?Care Management Coordinator ?Direct Line [phone] ?Toll Free: [phone]  ?Fax: [phone] ? ?

## 2021-11-25 NOTE — Patient Instructions (Signed)
Patient Goals/Self-Care Activities: Heart Failure ?Take all medications as prescribed ?Attend all scheduled provider appointments ?call office if I gain more than 2 pounds in one day or 5 pounds in one week ?keep legs up while sitting ?track weight in diary ?use salt in moderation ?watch for swelling in feet, ankles and legs every day ?weigh myself daily ?follow rescue plan if symptoms flare-up ? ?

## 2021-12-03 ENCOUNTER — Ambulatory Visit (INDEPENDENT_AMBULATORY_CARE_PROVIDER_SITE_OTHER): Payer: Medicare PPO

## 2021-12-03 DIAGNOSIS — I428 Other cardiomyopathies: Secondary | ICD-10-CM | POA: Diagnosis not present

## 2021-12-08 LAB — CUP PACEART REMOTE DEVICE CHECK
Battery Remaining Longevity: 13 mo
Battery Voltage: 2.88 V
Brady Statistic AP VP Percent: 0.1 %
Brady Statistic AP VS Percent: 47.14 %
Brady Statistic AS VP Percent: 0.04 %
Brady Statistic AS VS Percent: 52.72 %
Brady Statistic RA Percent Paced: 46.72 %
Brady Statistic RV Percent Paced: 0.15 %
Date Time Interrogation Session: 20230517093326
HighPow Impedance: 40 Ohm
HighPow Impedance: 51 Ohm
Implantable Pulse Generator Implant Date: 20131120
Lead Channel Impedance Value: 304 Ohm
Lead Channel Impedance Value: 342 Ohm
Lead Channel Impedance Value: 380 Ohm
Lead Channel Pacing Threshold Amplitude: 0.75 V
Lead Channel Pacing Threshold Amplitude: 0.875 V
Lead Channel Pacing Threshold Pulse Width: 0.4 ms
Lead Channel Pacing Threshold Pulse Width: 0.4 ms
Lead Channel Sensing Intrinsic Amplitude: 0.5 mV
Lead Channel Sensing Intrinsic Amplitude: 0.5 mV
Lead Channel Sensing Intrinsic Amplitude: 2.875 mV
Lead Channel Sensing Intrinsic Amplitude: 2.875 mV
Lead Channel Setting Pacing Amplitude: 1.5 V
Lead Channel Setting Pacing Amplitude: 2 V
Lead Channel Setting Pacing Pulse Width: 0.4 ms
Lead Channel Setting Sensing Sensitivity: 0.3 mV

## 2021-12-11 ENCOUNTER — Other Ambulatory Visit (HOSPITAL_COMMUNITY): Payer: Self-pay

## 2021-12-12 ENCOUNTER — Other Ambulatory Visit: Payer: Self-pay | Admitting: Physician Assistant

## 2021-12-12 MED ORDER — OXYBUTYNIN CHLORIDE ER 5 MG PO TB24: 5.0000 mg | ORAL_TABLET | Freq: Every day | ORAL | 1 refills | Status: DC

## 2021-12-16 DIAGNOSIS — I5089 Other heart failure: Secondary | ICD-10-CM | POA: Diagnosis not present

## 2021-12-16 DIAGNOSIS — I10 Essential (primary) hypertension: Secondary | ICD-10-CM | POA: Diagnosis not present

## 2021-12-16 DIAGNOSIS — J449 Chronic obstructive pulmonary disease, unspecified: Secondary | ICD-10-CM | POA: Diagnosis not present

## 2021-12-16 DIAGNOSIS — G4733 Obstructive sleep apnea (adult) (pediatric): Secondary | ICD-10-CM | POA: Diagnosis not present

## 2021-12-17 ENCOUNTER — Other Ambulatory Visit (INDEPENDENT_AMBULATORY_CARE_PROVIDER_SITE_OTHER): Payer: Medicare PPO

## 2021-12-17 ENCOUNTER — Encounter: Payer: Self-pay | Admitting: Podiatry

## 2021-12-17 ENCOUNTER — Other Ambulatory Visit: Payer: Medicare PPO

## 2021-12-17 ENCOUNTER — Ambulatory Visit (INDEPENDENT_AMBULATORY_CARE_PROVIDER_SITE_OTHER): Payer: Medicare PPO | Admitting: Podiatry

## 2021-12-17 ENCOUNTER — Ambulatory Visit: Payer: Medicare PPO | Admitting: Podiatry

## 2021-12-17 DIAGNOSIS — E782 Mixed hyperlipidemia: Secondary | ICD-10-CM

## 2021-12-17 DIAGNOSIS — L84 Corns and callosities: Secondary | ICD-10-CM | POA: Diagnosis not present

## 2021-12-17 DIAGNOSIS — E1142 Type 2 diabetes mellitus with diabetic polyneuropathy: Secondary | ICD-10-CM | POA: Diagnosis not present

## 2021-12-17 DIAGNOSIS — B351 Tinea unguium: Secondary | ICD-10-CM | POA: Diagnosis not present

## 2021-12-17 DIAGNOSIS — R748 Abnormal levels of other serum enzymes: Secondary | ICD-10-CM

## 2021-12-17 DIAGNOSIS — M79674 Pain in right toe(s): Secondary | ICD-10-CM | POA: Diagnosis not present

## 2021-12-17 DIAGNOSIS — M79675 Pain in left toe(s): Secondary | ICD-10-CM | POA: Diagnosis not present

## 2021-12-17 LAB — HEPATIC FUNCTION PANEL
ALT: 5 U/L (ref 0–35)
AST: 15 U/L (ref 0–37)
Albumin: 4.3 g/dL (ref 3.5–5.2)
Alkaline Phosphatase: 101 U/L (ref 39–117)
Bilirubin, Direct: 0.1 mg/dL (ref 0.0–0.3)
Total Bilirubin: 0.5 mg/dL (ref 0.2–1.2)
Total Protein: 7.5 g/dL (ref 6.0–8.3)

## 2021-12-17 LAB — GAMMA GT: GGT: 14 U/L (ref 7–51)

## 2021-12-17 NOTE — Progress Notes (Signed)
Remote ICD transmission.   

## 2021-12-21 NOTE — Progress Notes (Signed)
  Subjective:  Patient ID: [name], female    DOB: [birth year],  MRN: [id number]  [name] presents to clinic today for at risk foot care with history of diabetic neuropathy and callus(es) b/l lower extremities and painful thick toenails that are difficult to trim. Painful toenails interfere with ambulation. Aggravating factors include wearing enclosed shoe gear. Pain is relieved with periodic professional debridement. Painful calluses are aggravated when weightbearing with and without shoegear. Pain is relieved with periodic professional debridement.  Patient states blood glucose was 100 mg/dl today.  Last known HgA1c was around 5%.  She is accompanied by her daughter, [name], on today's visit.  New problem(s): None.   PCP is Marcellina Millin , and last visit was October 17, 2021.  Allergies  Allergen Reactions   Benazepril Hcl Other (See Comments)    Unknown   Lotrel [Amlodipine Besy-Benazepril Hcl] Other (See Comments)    Hypotension    Talwin [Pentazocine] Other (See Comments)    Hallucinations      Review of Systems: Negative except as noted in the HPI.  Objective: No changes noted in today's physical examination. Constitutional [name] is a pleasant 81 y.o. African American female, WD, WN in NAD. AAO x 3.   Vascular CFT <3 seconds b/l LE. Faintly palpable DP pulses b/l LE. Faintly palpable PT pulse(s) b/l LE. Pedal hair absent. No pain with calf compression b/l. Lower extremity skin temperature gradient within normal limits. No edema noted b/l LE. No ischemia or gangrene noted b/l LE. No cyanosis or clubbing noted b/l LE.  Neurologic Normal speech. Oriented to person, place, and time. Protective sensation decreased with 10 gram monofilament b/l. Vibratory sensation intact b/l.  Dermatologic Pedal skin is warm and supple b/l LE. No open wounds b/l LE. No interdigital macerations noted b/l LE. Toenails 1-5 b/l elongated, discolored, dystrophic, thickened,  crumbly with subungual debris and tenderness to dorsal palpation. Hyperkeratotic lesion(s) submet head 5 lbilaterally. No erythema, no edema, no drainage, no fluctuance.  Orthopedic: Muscle strength 5/5 to all lower extremity muscle groups bilaterally. 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 Patient ambulates independent of any assistive aids.   Radiographs: None    Latest Ref Rng & Units 10/17/2021    2:09 PM 04/16/2021    1:30 PM  Hemoglobin A1C  Hemoglobin-A1c 4.8 - 5.6 % 6.1   6.0     Assessment/Plan: 1. Pain due to onychomycosis of toenails of both feet   2. Callus   3. Mixed hyperlipidemia   4. Diabetic peripheral neuropathy associated with type 2 diabetes mellitus ([place])     -Patient was evaluated and treated. All patient's and/or POA's questions/concerns answered on today's visit. -Ordered noninvasive arterial studies ABIs with and without TBIs for b/l lower extremities. -Patient to continue soft, supportive shoe gear daily. -Mycotic toenails 1-5 bilaterally were debrided in length and girth with sterile nail nippers and dremel without incident. -Callus(es) submet head 5 b/l pared utilizing sterile scalpel blade without complication or incident. Total number debrided =2. -Patient/POA to call should there be question/concern in the interim.   Return in about 3 months (around 03/19/2022).  Marzetta Board, DPM

## 2021-12-23 ENCOUNTER — Other Ambulatory Visit: Payer: Self-pay | Admitting: Physician Assistant

## 2021-12-23 DIAGNOSIS — R748 Abnormal levels of other serum enzymes: Secondary | ICD-10-CM

## 2021-12-23 DIAGNOSIS — N184 Chronic kidney disease, stage 4 (severe): Secondary | ICD-10-CM

## 2021-12-23 DIAGNOSIS — E1142 Type 2 diabetes mellitus with diabetic polyneuropathy: Secondary | ICD-10-CM

## 2021-12-23 NOTE — Progress Notes (Signed)
Thank you. Future CMP ordered for 4 months.

## 2021-12-24 ENCOUNTER — Other Ambulatory Visit: Payer: Self-pay | Admitting: Podiatry

## 2021-12-24 DIAGNOSIS — E1142 Type 2 diabetes mellitus with diabetic polyneuropathy: Secondary | ICD-10-CM

## 2021-12-27 ENCOUNTER — Other Ambulatory Visit (HOSPITAL_COMMUNITY): Payer: Self-pay

## 2021-12-27 ENCOUNTER — Telehealth: Payer: Self-pay | Admitting: Pharmacy Technician

## 2021-12-27 NOTE — Telephone Encounter (Signed)
Patient Advocate Encounter  Received notification from [place] that prior authorization for [facility] is required.   PA NOT submitted on 6.10.23 [place] [id number]  AVAILABLE WITH A PA. RETURN COPAY IS $47/30 [place], CPhT Patient Advocate Phone: [phone]

## 2022-01-08 ENCOUNTER — Ambulatory Visit (HOSPITAL_COMMUNITY)
Admission: RE | Admit: 2022-01-08 | Discharge: 2022-01-08 | Disposition: A | Discharge status: Home / Self Care | Payer: Medicare PPO | Source: Ambulatory Visit | Attending: Internal Medicine | Admitting: Internal Medicine

## 2022-01-08 DIAGNOSIS — E1142 Type 2 diabetes mellitus with diabetic polyneuropathy: Secondary | ICD-10-CM | POA: Diagnosis not present

## 2022-01-09 ENCOUNTER — Telehealth (INDEPENDENT_AMBULATORY_CARE_PROVIDER_SITE_OTHER): Payer: Medicare PPO | Admitting: Podiatry

## 2022-01-09 ENCOUNTER — Ambulatory Visit: Payer: Medicare PPO | Admitting: Pulmonary Disease

## 2022-01-09 NOTE — Telephone Encounter (Signed)
Phoned daughter, Remmy Jenness, and gave results of ABIs. No evidence of significant LE arterial disease. Toe pressure on LLE is slightly decreased at 0.68. Patient currently has no open wounds nor symptoms of claudication. Vascular Consultation not indicated at this time. I will see her at her next scheduled appointment.

## 2022-01-16 ENCOUNTER — Encounter: Payer: Self-pay | Admitting: Internal Medicine

## 2022-01-16 DIAGNOSIS — I10 Essential (primary) hypertension: Secondary | ICD-10-CM | POA: Diagnosis not present

## 2022-01-16 DIAGNOSIS — J449 Chronic obstructive pulmonary disease, unspecified: Secondary | ICD-10-CM | POA: Diagnosis not present

## 2022-01-16 DIAGNOSIS — I5089 Other heart failure: Secondary | ICD-10-CM | POA: Diagnosis not present

## 2022-01-16 DIAGNOSIS — G4733 Obstructive sleep apnea (adult) (pediatric): Secondary | ICD-10-CM | POA: Diagnosis not present

## 2022-02-04 ENCOUNTER — Ambulatory Visit: Payer: Medicare PPO | Admitting: Pulmonary Disease

## 2022-02-04 ENCOUNTER — Encounter: Payer: Self-pay | Admitting: Pulmonary Disease

## 2022-02-04 VITALS — BP 128/78 | HR 60 | Ht 61.0 in | Wt 170.4 lb

## 2022-02-04 DIAGNOSIS — J9611 Chronic respiratory failure with hypoxia: Secondary | ICD-10-CM | POA: Diagnosis not present

## 2022-02-04 DIAGNOSIS — G4733 Obstructive sleep apnea (adult) (pediatric): Secondary | ICD-10-CM | POA: Diagnosis not present

## 2022-02-04 DIAGNOSIS — I2721 Secondary pulmonary arterial hypertension: Secondary | ICD-10-CM

## 2022-02-04 DIAGNOSIS — Z9989 Dependence on other enabling machines and devices: Secondary | ICD-10-CM

## 2022-02-04 NOTE — Progress Notes (Signed)
[name] Pulmonary, Critical Care, and Sleep Medicine  Chief Complaint  Patient presents with   Follow-up    Pt states she has been doing okay since last visit and denies any complaints.    Constitutional:  BP 128/78 (BP Location: Left Arm, Patient Position: Sitting, Cuff Size: Normal)   Pulse 60   Ht _0  (1.549 m)   Wt 170 lb 6.4 oz (77.3 kg)   SpO2 97% Comment: RA  BMI 32.20 kg/m   Past Medical History:  Anemia, CAD, Systolic CHF, CKD, HTN, Rectal cancer, CVA, DM  Past Surgical History:  She  has a past surgical history that includes Upper gi endoscopy (11/28/2009); Upper gastrointestinal endoscopy; Colonoscopy; Tonsillectomy and adenoidectomy; Tubal ligation; Colonoscopy with propofol (N/A, 03/29/2018); Esophagogastroduodenoscopy (egd) with propofol (N/A, 03/29/2018); biopsy (03/29/2018); polypectomy (03/29/2018); RIGHT HEART CATH (N/A, 02/15/2020); and RIGHT HEART CATH (N/A, 10/31/2020).  Brief Summary:  [name] is a 81 y.o. female former smoker with OSA, chronic respiratory failure and pulmonary hypertension.      Subjective:   She is here with her daughter.  Using CPAP and 2 liters oxygen at night.  Sleeping well.  No issues with mask fit or pressure setting.  Not having cough, wheeze, sputum, chest pain, or leg swelling.    Physical Exam:   Appearance - well kempt   ENMT - no sinus tenderness, no oral exudate, no LAN, Mallampati 3 airway, no stridor  Respiratory - equal breath sounds bilaterally, no wheezing or rales  CV - s1s2 regular rate and rhythm, no murmurs  Ext - no clubbing, no edema  Skin - no rashes  Psych - normal mood and affect    Pulmonary testing:  PFT 06/28/07 >> FEV1 0.73 (39%), FEV1% 92, TLC 2.41 (51%) PFT 05/04/17 >> FEV1 1.22 (79%), FEV1% 69, TLC 3.12 (75%), DLCO 39%, +BD PFT 08/26/20 >> FEV1 1.27 (95%), FEV1% 80, TLC 3.48 (73%), DLCO 60%  Chest Imaging:  HRCT chest 02/15/20 >> small b/l effusions, cardiomegaly,  atherosclerosis, enlarged pulmonary artery 3.6 cm  Sleep Tests:  PSG 10/02/13 >> AHI 59.6, SpO2 low 80% PSG 07/05/14 >> AHI 15.1, SpO2 low 76% CPAP 01/06/18 to 04/05/18 >> used on 90 of 90 nights with average 9 hrs 9 min.  Average AHI 8.2 with CPAP 18 cm H2O. PSG 04/05/20 >> AHI 6.4, SpO2 low 83%, needed 2 liters Auto CPAP 01/04/22 to 02/02/22 >> used on 30 of 30 nights with average 8 hrs 39 min.  Average AHI 5.2 with median CPAP 16 and 95 th percentile CPAP 19 cm H2O  Cardiac Tests:  Echo 10/16/20 >> EF 55 to 60%, grade 1 DD, RVSP 72.6 mmHg, mod LA/RA dilation  Social History:  She  reports that she quit smoking about 12 years ago. Her smoking use included cigarettes. She has never used smokeless tobacco. She reports that she does not drink alcohol and does not use drugs.  Family History:  Her family history includes Colon polyps in her daughter; Diabetes in her brother; Hypertension in her brother, daughter, daughter, father, and mother; Stroke in her mother.     Assessment/Plan:   Obstructive sleep apnea. - she is compliant with CPAP and reports benefit from therapy - uses [employer] for her DME - current CPAP was ordered on 04/24/20 - continue auto CPAP 4 to 20 cm H2O with 2 liters oxygen at night   Chronic respiratory failure with hypoxia. - goal SpO2 > 90% - uses 2 liters with exertion and with CPAP  at night  Pulmonary hypertension, chronic combined CHF. - followed by Dr. Pierre [name] with [facility] Heart Failure team  CKD 3b. - followed by Dr. Carolynne [name] with [facility]  Time Spent Involved in Patient Care on Day of Examination:  17 minutes  Follow up:   Patient Instructions  Follow up in 1 year  Medication List:   Allergies as of 02/04/2022       Reactions   Benazepril Hcl Other (See Comments)   Unknown   Lotrel [amlodipine Besy-benazepril Hcl] Other (See Comments)   Hypotension   Talwin [pentazocine] Other (See Comments)   Hallucinations           Medication List        Accurate as of February 04, 2022  4:40 PM. If you have any questions, ask your nurse or doctor.          STOP taking these medications    Vitamin D (Ergocalciferol) 1.25 MG (50000 UNIT) Caps capsule Commonly known as: DRISDOL Stopped by: Erin Mires, MD       TAKE these medications    amLODipine 10 MG tablet Commonly known as: NORVASC Take 10 mg by mouth at bedtime.   aspirin 81 MG tablet Take 81 mg by mouth at bedtime.   atorvastatin 40 MG tablet Commonly known as: LIPITOR TAKE 1 TABLET (40 MG TOTAL) BY MOUTH DAILY.   carvedilol 3.125 MG tablet Commonly known as: COREG TAKE 1 TABLET TWICE DAILY WITH MEALS What changed: when to take this   cholecalciferol 25 MCG (1000 UNIT) tablet Commonly known as: VITAMIN D3 Take 1,000 Units by mouth daily.   Vitamin D3 1.25 MG (50000 UT) Caps Take by mouth.   DropSafe Alcohol Prep 70 % Pads TEST BLOOD SUGAR ONE TIME DAILY   ferrous sulfate 325 (65 FE) MG tablet Take 325 mg by mouth every Monday, Wednesday, and Friday.   linaclotide 290 MCG Caps capsule Commonly known as: Linzess Take 1 capsule by mouth daily 30 minutes before breakfast.   NONFORMULARY OR COMPOUNDED ITEM Antifungal solution: Terbinafine 3%, Fluconazole 2%, Tea Tree Oil 5%, Urea 10%, Ibuprofen 2% in DMSO suspension #66m   oxybutynin 5 MG 24 hr tablet Commonly known as: DITROPAN-XL Take 1 tablet (5 mg total) by mouth at bedtime.   OXYGEN Inhale 2 L into the lungs at bedtime. Uses during the day as needed   potassium chloride 10 MEQ tablet Commonly known as: KLOR-CON Take 20 mEq by mouth daily as needed. 10 mg prn with turosemide   torsemide 20 MG tablet Commonly known as: DEMADEX Take 3 tablets (60 mg total) by mouth daily as needed.   True Metrix Blood Glucose Test test strip Generic drug: glucose blood TEST BLOOD SUGAR ONE TIME DAILY   True Metrix Level 1 Low Soln USE AS DIRECTED WITH GLUCOSE METER   True Metrix  Meter Devi Test daily  Pt will need a true metrix meter E11.9   TRUEplus Lancets 33G Misc TEST BLOOD SUGAR ONE TIME DAILY        Signature:  VChesley Mires MD LYoungPager - [phone]/19/2023, 4:40 PM

## 2022-02-04 NOTE — Patient Instructions (Signed)
Follow up in 1 year.

## 2022-02-06 DIAGNOSIS — N184 Chronic kidney disease, stage 4 (severe): Secondary | ICD-10-CM | POA: Diagnosis not present

## 2022-02-15 DIAGNOSIS — G4733 Obstructive sleep apnea (adult) (pediatric): Secondary | ICD-10-CM | POA: Diagnosis not present

## 2022-02-15 DIAGNOSIS — J449 Chronic obstructive pulmonary disease, unspecified: Secondary | ICD-10-CM | POA: Diagnosis not present

## 2022-02-15 DIAGNOSIS — I10 Essential (primary) hypertension: Secondary | ICD-10-CM | POA: Diagnosis not present

## 2022-02-15 DIAGNOSIS — I5089 Other heart failure: Secondary | ICD-10-CM | POA: Diagnosis not present

## 2022-02-17 ENCOUNTER — Other Ambulatory Visit: Payer: Self-pay

## 2022-02-17 NOTE — Patient Outreach (Signed)
[facility]) Care Management  02/17/2022  [name] Apr 30, 1941 [id number]   Spoke with patient. She is meeting goals.  Discussed case closure. Patient agreeable.    Care Plan : RN Care Manager Plan of Care  Updates made by Jon Billings, RN since 02/17/2022 12:00 AM  Completed 02/17/2022   Problem: Chronic Disease Management and Care Coordination Needs of Heart Failure Resolved 02/17/2022  Priority: High     Long-Range Goal: Development of Plan of Care for the Management of Heart Failure Completed 02/17/2022  Start Date: 09/08/2021  Expected End Date: 07/19/2022  Recent Progress: On track  Priority: High  Note:   Current Barriers:  Chronic Disease Management support and education needs related to CHF   RNCM Clinical Goal(s):  Patient will verbalize understanding of plan for management of CHF as evidenced by reports by continued daily weights and self report of limiting salt intake take all medications exactly as prescribed and will call provider for medication related questions as evidenced by patient reporting taking medications as prescribed demonstrate ongoing self health care management ability as  it related to heart failure as evidenced by patient reports during  through collaboration with RN Care manager, provider, and care team.   Interventions: Patient continuing to follow Heart failure plan Inter-disciplinary care team collaboration (see longitudinal plan of care) Evaluation of current treatment plan related to  self management and patient's adherence to plan as established by provider   Heart Failure Interventions:  (Status:  New goal.) Long Term Goal Provided education on low sodium diet Discussed importance of daily weight and advised patient to weigh and record daily Reviewed role of diuretics in prevention of fluid overload and management of heart failure; Discussed the importance of keeping all appointments with provider  11/25/21 Patient reports  doing excellent.  Weight 151 lbs.  She reports only taking extra lasix about twice in the last month.  Discussed heart failure management.  No concerns.     Patient Goals/Self-Care Activities: Heart Failure Take all medications as prescribed Attend all scheduled provider appointments call office if I gain more than 2 pounds in one day or 5 pounds in one week keep legs up while sitting track weight in diary use salt in moderation watch for swelling in feet, ankles and legs every day weigh myself daily follow rescue plan if symptoms flare-up  Follow Up Plan:  Closing case as patient meeting goals.      Plan: Closing case.

## 2022-03-04 ENCOUNTER — Ambulatory Visit (INDEPENDENT_AMBULATORY_CARE_PROVIDER_SITE_OTHER): Payer: Medicare PPO

## 2022-03-04 DIAGNOSIS — I428 Other cardiomyopathies: Secondary | ICD-10-CM

## 2022-03-04 LAB — CUP PACEART REMOTE DEVICE CHECK
Battery Remaining Longevity: 10 mo
Battery Voltage: 2.87 V
Brady Statistic AP VP Percent: 0.88 %
Brady Statistic AP VS Percent: 56.66 %
Brady Statistic AS VP Percent: 0.17 %
Brady Statistic AS VS Percent: 42.3 %
Brady Statistic RA Percent Paced: 56.96 %
Brady Statistic RV Percent Paced: 1.15 %
Date Time Interrogation Session: 20230816022824
HighPow Impedance: 38 Ohm
HighPow Impedance: 52 Ohm
Implantable Pulse Generator Implant Date: 20131120
Lead Channel Impedance Value: 304 Ohm
Lead Channel Impedance Value: 342 Ohm
Lead Channel Impedance Value: 399 Ohm
Lead Channel Pacing Threshold Amplitude: 0.75 V
Lead Channel Pacing Threshold Amplitude: 0.875 V
Lead Channel Pacing Threshold Pulse Width: 0.4 ms
Lead Channel Pacing Threshold Pulse Width: 0.4 ms
Lead Channel Sensing Intrinsic Amplitude: 0.375 mV
Lead Channel Sensing Intrinsic Amplitude: 0.375 mV
Lead Channel Sensing Intrinsic Amplitude: 3.875 mV
Lead Channel Sensing Intrinsic Amplitude: 3.875 mV
Lead Channel Setting Pacing Amplitude: 1.5 V
Lead Channel Setting Pacing Amplitude: 2 V
Lead Channel Setting Pacing Pulse Width: 0.4 ms
Lead Channel Setting Sensing Sensitivity: 0.3 mV

## 2022-03-18 DIAGNOSIS — G4733 Obstructive sleep apnea (adult) (pediatric): Secondary | ICD-10-CM | POA: Diagnosis not present

## 2022-03-18 DIAGNOSIS — I5089 Other heart failure: Secondary | ICD-10-CM | POA: Diagnosis not present

## 2022-03-18 DIAGNOSIS — I10 Essential (primary) hypertension: Secondary | ICD-10-CM | POA: Diagnosis not present

## 2022-03-18 DIAGNOSIS — J449 Chronic obstructive pulmonary disease, unspecified: Secondary | ICD-10-CM | POA: Diagnosis not present

## 2022-03-20 ENCOUNTER — Ambulatory Visit: Payer: Medicare PPO | Admitting: Podiatry

## 2022-03-25 ENCOUNTER — Encounter: Payer: Self-pay | Admitting: Internal Medicine

## 2022-04-02 NOTE — Progress Notes (Signed)
Remote ICD transmission.   

## 2022-04-07 ENCOUNTER — Telehealth: Payer: Self-pay | Admitting: Physician Assistant

## 2022-04-07 NOTE — Telephone Encounter (Signed)
Left message for patient to call back and schedule Medicare Annual Wellness Visit (AWV) either virtually or in office. I left my number for patient to call [phone].  Last AWV ;04/16/21  please schedule at anytime with health coach

## 2022-04-09 ENCOUNTER — Other Ambulatory Visit: Payer: Self-pay | Admitting: Cardiovascular Disease

## 2022-04-14 ENCOUNTER — Ambulatory Visit: Payer: Medicare PPO | Admitting: Physician Assistant

## 2022-04-17 ENCOUNTER — Ambulatory Visit: Payer: Medicare PPO | Admitting: Physician Assistant

## 2022-04-17 ENCOUNTER — Ambulatory Visit (INDEPENDENT_AMBULATORY_CARE_PROVIDER_SITE_OTHER): Payer: Medicare PPO | Admitting: Medical

## 2022-04-17 VITALS — BP 124/64 | HR 62 | Ht 61.0 in | Wt 172.8 lb

## 2022-04-17 DIAGNOSIS — I272 Pulmonary hypertension, unspecified: Secondary | ICD-10-CM

## 2022-04-17 DIAGNOSIS — Z23 Encounter for immunization: Secondary | ICD-10-CM | POA: Diagnosis not present

## 2022-04-17 DIAGNOSIS — I10 Essential (primary) hypertension: Secondary | ICD-10-CM

## 2022-04-17 DIAGNOSIS — Z0000 Encounter for general adult medical examination without abnormal findings: Secondary | ICD-10-CM

## 2022-04-17 DIAGNOSIS — E1142 Type 2 diabetes mellitus with diabetic polyneuropathy: Secondary | ICD-10-CM | POA: Diagnosis not present

## 2022-04-17 DIAGNOSIS — Z95810 Presence of automatic (implantable) cardiac defibrillator: Secondary | ICD-10-CM

## 2022-04-17 DIAGNOSIS — I2510 Atherosclerotic heart disease of native coronary artery without angina pectoris: Secondary | ICD-10-CM

## 2022-04-17 DIAGNOSIS — J449 Chronic obstructive pulmonary disease, unspecified: Secondary | ICD-10-CM | POA: Diagnosis not present

## 2022-04-17 DIAGNOSIS — Z9981 Dependence on supplemental oxygen: Secondary | ICD-10-CM

## 2022-04-17 DIAGNOSIS — G4733 Obstructive sleep apnea (adult) (pediatric): Secondary | ICD-10-CM

## 2022-04-17 DIAGNOSIS — N3941 Urge incontinence: Secondary | ICD-10-CM | POA: Diagnosis not present

## 2022-04-17 DIAGNOSIS — Z9989 Dependence on other enabling machines and devices: Secondary | ICD-10-CM

## 2022-04-17 DIAGNOSIS — I5042 Chronic combined systolic (congestive) and diastolic (congestive) heart failure: Secondary | ICD-10-CM | POA: Diagnosis not present

## 2022-04-17 DIAGNOSIS — D509 Iron deficiency anemia, unspecified: Secondary | ICD-10-CM

## 2022-04-17 DIAGNOSIS — I2721 Secondary pulmonary arterial hypertension: Secondary | ICD-10-CM

## 2022-04-17 DIAGNOSIS — N184 Chronic kidney disease, stage 4 (severe): Secondary | ICD-10-CM

## 2022-04-17 DIAGNOSIS — E782 Mixed hyperlipidemia: Secondary | ICD-10-CM

## 2022-04-17 NOTE — Patient Instructions (Signed)
A preventative services visit was completed today.  During the course of the visit today, we discussed and counseled about appropriate screening and preventive services.  A health risk assessment was established today that included a review of current medications, allergies, social history, family history, medical and preventative health history, biometrics, and preventative screenings to identify potential safety concerns or impairments.  This visit was a preventative care visit, also known as wellness visit or routine physical.   Topics typically include healthy lifestyle, diet, exercise, preventative care, vaccinations, sick and well care, proper use of emergency dept and after hours care, as well as other concerns.     Recommendations: Continue to return yearly for your annual wellness and preventative care visits.  This gives [place] a chance to discuss healthy lifestyle, exercise, vaccinations, review your chart record, and perform screenings where appropriate.  I recommend you see your eye doctor yearly for routine vision care.  I recommend you see your dentist yearly for routine dental care including hygiene visits twice yearly.   Vaccination recommendations were reviewed Immunization History  Administered Date(s) Administered   Fluad Quad(high Dose 65+) 04/04/2019, 04/15/2020, 04/16/2021, 04/17/2022   Influenza, High Dose Seasonal PF 08/04/2016, 05/02/2017, 04/06/2018   Influenza-Unspecified 05/23/2013, 08/25/2013, 06/22/2014, 06/22/2014, 05/22/2015   PFIZER(Purple Top)SARS-COV-2 Vaccination 09/12/2019, 10/03/2019, 04/30/2020   Pfizer Covid-19 Vaccine Bivalent Booster 25yr & up 05/19/2021   Pneumococcal Conjugate-13 06/22/2014   Pneumococcal-Unspecified 01/12/2008   Tdap 01/12/2008   Zoster, Live 07/21/2011, 12/08/2011   Vaccine recommendations:  We updated you yearly flu shot today  At the pharmacy, get the following vaccines at your convenience: Shingrix #2 Covid new  booster Tdap tetanus pertussis  However, do each of these separately, apart from each other by at least 2 weeks.  At your convenience, you can return here for the Prevnar 20 updated pneumococcal vaccine.    Screening for cancer: Reviewed common cancer screens, but after age 309many cancer screens are not continued per USPSTF guidelines   Skin cancer screening: Check your skin regularly for new changes, growing lesions, or other lesions of concern Come in for evaluation if you have skin lesions of concern.  Lung cancer screening: If you have a greater than 20 pack year history of tobacco use, then you may qualify for lung cancer screening with a chest CT scan.   Please call your insurance company to inquire about coverage for this test.  We currently don't have screenings for other cancers besides breast, cervical, colon, and lung cancers.  If you have a strong family history of cancer or have other cancer screening concerns, please let me know.    Bone health: Get at least 150 minutes of aerobic exercise weekly Get weight bearing exercise at least once weekly Bone density test:  A bone density test is an imaging test that uses a type of X-ray to measure the amount of calcium and other minerals in your bones. The test may be used to diagnose or screen you for a condition that causes weak or thin bones (osteoporosis), predict your risk for a broken bone (fracture), or determine how well your osteoporosis treatment is working. The bone density test is recommended for females 678and older, or females or males <<57if certain risk factors such as thyroid disease, long term use of steroids such as for asthma or rheumatological issues, vitamin D deficiency, estrogen deficiency, family history of osteoporosis, self or family history of fragility fracture in first degree relative.   Please call to schedule  your bone density test.   [facility]  [phone]  [address], [place], [place] [address]   Heart health: Get at least 150 minutes of aerobic exercise weekly Limit alcohol It is important to maintain a healthy blood pressure and healthy cholesterol numbers  Heart disease screening: Screening for heart disease includes screening for blood pressure, fasting lipids, glucose/diabetes screening, BMI height to weight ratio, reviewed of smoking status, physical activity, and diet.    Goals include blood pressure 120/80 or less, maintaining a healthy lipid/cholesterol profile, preventing diabetes or keeping diabetes numbers under good control, not smoking or using tobacco products, exercising most days per week or at least 150 minutes per week of exercise, and eating healthy variety of fruits and vegetables, healthy oils, and avoiding unhealthy food choices like fried food, fast food, high sugar and high cholesterol foods.    Continue routine follow up with cardiology    Medical care options: I recommend you continue to seek care here first for routine care.  We try really hard to have available appointments Monday through Friday daytime hours for sick visits, acute visits, and physicals.  Urgent care should be used for after hours and weekends for significant issues that cannot wait till the next day.  The emergency department should be used for significant potentially life-threatening emergencies.  The emergency department is expensive, can often have long wait times for less significant concerns, so try to utilize primary care, urgent care, or telemedicine when possible to avoid unnecessary trips to the emergency department.  Virtual visits and telemedicine have been introduced since the pandemic started in 2020, and can be convenient ways to receive medical care.  We offer virtual appointments as well to assist you in a variety of options to seek medical care.   Advanced Directives: I recommend you consider completing a [place] and Living Will.   These documents respect your wishes and help alleviate burdens on your loved ones if you were to become terminally ill or be in a position to need those documents enforced.    You can complete Advanced Directives yourself, have them notarized, then have copies made for our office, for you and for anybody you feel should have them in safe keeping.  Or, you can have an attorney prepare these documents.   If you haven't updated your Last Will and Testament in a while, it may be worthwhile having an attorney prepare these documents together and save on some costs.       Separate Significant Issues: Diabetes - currently diet controlled.  Labs today  Cardiomyopathy and HTN, history of heart failure - managed by cardiology  CAD, hyperlipidemia - continue statin, Aspirin  Urinary incontinence - on oxybutynin  CKD - sees nephrology, reviewed 07/2021 notes  OSA - compliant with CPAP  Iron deficiency - on iron daily  Pulmonary hypertension - sees pulmonology and cardiology

## 2022-04-17 NOTE — Progress Notes (Signed)
Subjective:    [name] is a 81 y.o. female who presents for Preventative Services visit and chronic medical problems/med check visit.    Here with her daughter [name].   Primary Care Provider Irene Pap, PA-C here for primary care formerly  Current Health Care Team: Dentist, Dr. Salome Holmes doctor, Dr. Katy Fitch  Medical Services you may have received from other than [facility] providers in the past year (date may be approximate) Dr. Dani Gobble Croitoru, Dr. Glori Bickers, cardiology Dr. [name], pulmonology Dr. Acquanetta Sit, podiatry Dr. Nile Dear, cardiology kidney Dr. Loris Cellar, GI   Exercise Current exercise habits:  walking some    Nutrition/Diet Current diet: in general, a "healthy" diet    Depression Screen    04/17/2022    1:50 PM  Depression screen PHQ 2/9  Decreased Interest 0  Down, Depressed, Hopeless 0  PHQ - 2 Score 0    Activities of Daily Living Screen/Functional Status Survey Is the patient deaf or have difficulty hearing?: No Does the patient have difficulty seeing, even when wearing glasses/contacts?: No Does the patient have difficulty concentrating, remembering, or making decisions?: No Does the patient have difficulty walking or climbing stairs?: No Does the patient have difficulty dressing or bathing?: No Does the patient have difficulty doing errands alone such as visiting a doctor's office or shopping?: No (daughters go with her)  Can patient draw a clock face showing 3:15 oclock, yes  Fall Risk Screen    04/17/2022    1:50 PM 10/17/2021    1:28 PM 09/08/2021   12:26 PM 04/16/2021   11:11 AM 02/26/2021   10:22 AM  Fall Risk   Falls in the past year? 1 0 0 0 0  Number falls in past yr: 0 0  0   Injury with Fall? 0 1  0   Risk for fall due to : Impaired balance/gait Impaired balance/gait  Impaired mobility;Impaired vision   Follow up Falls evaluation completed Falls evaluation completed;Education provided  Falls  evaluation completed     Gait Assessment: Normal gait observed yes  Advanced directives Does patient have a [place]? Yes not notarized though Does patient have a Living Will? 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 Rollene Rotunda in [place]. Per notes she was due for colonoscopy in 12/2016   OSA on CPAP 09/08/2017   Other cardiomyopathies ([place])    Oxygen deficiency    2 liters at night   Pulmonary HTN (HCC)    Sleep apnea    uses c-pap   Stroke [facility])    Type 2 diabetes, controlled, with peripheral neuropathy ([place])    Urge incontinence of urine     Past Surgical History:  Procedure Laterality Date   BIOPSY  03/29/2018   Procedure: BIOPSY;  Surgeon: Yetta Flock, MD;  Location: [facility];  Service: Gastroenterology;;   COLONOSCOPY     COLONOSCOPY WITH PROPOFOL N/A  03/29/2018   Procedure: COLONOSCOPY WITH PROPOFOL;  Surgeon: Yetta Flock, MD;  Location: [facility];  Service: Gastroenterology;  Laterality: N/A;   ESOPHAGOGASTRODUODENOSCOPY (EGD) WITH PROPOFOL N/A 03/29/2018   Procedure: ESOPHAGOGASTRODUODENOSCOPY (EGD) WITH PROPOFOL;  Surgeon: Yetta Flock, MD;  Location[facility];  Service: Gastroenterology;  Laterality: N/A;   POLYPECTOMY  03/29/2018   Procedure: POLYPECTOMY;  Surgeon: Yetta Flock, MD;  [facility]: [facility];  Service: Gastroenterology;;   RIGHT HEART CATH N/A 02/15/2020   Procedure: RIGHT HEART CATH;  Surgeon: Jolaine Artist,  MD;  Location: [place] [facility];  Service: Cardiovascular;  Laterality: N/A;   RIGHT HEART CATH N/A 10/31/2020   Procedure: RIGHT HEART CATH;  Surgeon: Martinique, Peter M, MD;  Location: [place] [facility];  Service: Cardiovascular;  Laterality: N/A;   TONSILLECTOMY AND ADENOIDECTOMY     age 33   TUBAL LIGATION     years ago   UPPER GASTROINTESTINAL ENDOSCOPY     UPPER GI ENDOSCOPY  11/28/2009   mild erosive esophagitis, mild erosive changes in stomach, chronic constipation    Social History   Socioeconomic History   Marital status: Widowed    Spouse name: Not on file   Number of children: Not on file   Years of education: Not on file   Highest education level: Not on file  Occupational History   Not on file  Tobacco Use   Smoking status: Former    Types: Cigarettes    Quit date: 03/14/2009    Years since quitting: 13.1   Smokeless tobacco: Never   Tobacco comments:    quit smoking 2010  Vaping Use   Vaping Use: Never used  Substance and Sexual Activity   Alcohol use: No   Drug use: No   Sexual activity: Not on file  Other Topics Concern   Not on file  Social History Narrative   Lives alone.  2 daughters and 1 granddaughter in [place]   Social Determinants of Health   Financial Resource Strain: Low Risk  (09/08/2021)   Overall Financial Resource Strain (CARDIA)    Difficulty of Paying Living Expenses: Not hard at all  Food Insecurity: No Food Insecurity (03/11/2020)   Hunger Vital Sign    Worried About Running Out of Food in the Last Year: Never true    [place] in the Last Year: Never true  Transportation Needs: No Transportation Needs (12/25/2020)   PRAPARE - Hydrologist (Medical): No    Lack of Transportation (Non-Medical): No  Physical Activity: Not on file  Stress: No Stress Concern Present (04/08/2020)   [place]    Feeling of Stress : Not at all  Social  Connections: Moderately Integrated (03/07/2020)   Social Connection and Isolation Panel [NHANES]    Frequency of Communication with Friends and Family: More than three times a week    Frequency of Social Gatherings with Friends and Family: More than three times a week    Attends Religious Services: More than 4 times per year    Active Member of [employer] or Organizations: Yes    Attends Archivist Meetings: 1 to 4 times per year    Marital Status: Widowed  Intimate Partner Violence: Not on file    Family History  Problem Relation Age of Onset   Colon polyps Daughter    Hypertension Daughter    Hypertension Mother    Stroke  Mother    Hypertension Father    Diabetes Brother    Hypertension Brother    Hypertension Daughter    Colon cancer Neg Hx    Esophageal cancer Neg Hx    Rectal cancer Neg Hx    Stomach cancer Neg Hx      Current Outpatient Medications:    Alcohol Swabs (DROPSAFE ALCOHOL PREP) 70 % PADS, TEST BLOOD SUGAR ONE TIME DAILY, Disp: 100 each, Rfl: 1   amLODipine (NORVASC) 10 MG tablet, Take 10 mg by mouth at bedtime., Disp: , Rfl:    aspirin 81 MG tablet, Take 81 mg by mouth at bedtime. , Disp: , Rfl:    atorvastatin (LIPITOR) 40 MG tablet, TAKE 1 TABLET (40 MG TOTAL) BY MOUTH DAILY., Disp: 90 tablet, Rfl: 1   Blood Glucose Calibration (TRUE METRIX LEVEL 1) Low SOLN, USE AS DIRECTED WITH GLUCOSE METER, Disp: 1 each, Rfl: 0   Blood Glucose Monitoring Suppl (TRUE METRIX METER) DEVI, Test daily  Pt will need a true metrix meter E11.9, Disp: 1 each, Rfl: 0   carvedilol (COREG) 3.125 MG tablet, TAKE 1 TABLET TWICE DAILY WITH MEALS (Patient taking differently: Take 3.125 mg by mouth 2 (two) times daily with a meal.), Disp: 180 tablet, Rfl: 3   cholecalciferol (VITAMIN D3) 25 MCG (1000 UNIT) tablet, Take 2,000 Units by mouth daily., Disp: , Rfl:    ferrous sulfate 325 (65 FE) MG tablet, Take 325 mg by mouth every Monday, Wednesday, and Friday., Disp: , Rfl:     linaclotide (LINZESS) 290 MCG CAPS capsule, Take 1 capsule by mouth daily 30 minutes before breakfast., Disp: 30 capsule, Rfl: 2   NONFORMULARY OR COMPOUNDED ITEM, Antifungal solution: Terbinafine 3%, Fluconazole 2%, Tea Tree Oil 5%, Urea 10%, Ibuprofen 2% in DMSO suspension #27m, Disp: 1 each, Rfl: 3   oxybutynin (DITROPAN-XL) 5 MG 24 hr tablet, Take 1 tablet (5 mg total) by mouth at bedtime., Disp: 90 tablet, Rfl: 1   OXYGEN, Inhale 2 L into the lungs at bedtime. Uses during the day as needed, Disp: , Rfl:    potassium chloride (KLOR-CON) 10 MEQ tablet, Take 20 mEq by mouth daily as needed. 10 mg prn with turosemide, Disp: , Rfl:    torsemide (DEMADEX) 20 MG tablet, Take 3 tablets (60 mg total) by mouth daily as needed., Disp: 90 tablet, Rfl: 3   TRUE METRIX BLOOD GLUCOSE TEST test strip, TEST BLOOD SUGAR ONE TIME DAILY, Disp: 100 strip, Rfl: 0   TRUEplus Lancets 33G MISC, TEST BLOOD SUGAR ONE TIME DAILY, Disp: 100 each, Rfl: 1  Allergies  Allergen Reactions   Benazepril Hcl Other (See Comments)    Unknown   Lotrel [Amlodipine Besy-Benazepril Hcl] Other (See Comments)    Hypotension    Talwin [Pentazocine] Other (See Comments)    Hallucinations      History reviewed: allergies, current medications, past family history, past medical history, past social history, past surgical history and problem list  Chronic issues discussed: Compliant with medications  Uses Torsemide along with potassium prn, on average once weekly.  Uses oxygen at bedtime only  No longer drives.  Can drive but never renewed licenese a while back  Lives alone but her daughter checks on her 3 days per week and talks to her everyday on the phone.  No memory concerns  Acute issues discussed: none   Objective:      Biometrics BP 124/64   Pulse 62   Ht _0  (1.549 m)  Wt 172 lb 12.8 oz (78.4 kg)   BMI 32.65 kg/m   Wt Readings from Last 3 Encounters:  04/17/22 172 lb 12.8 oz (78.4 kg)  02/04/22  170 lb 6.4 oz (77.3 kg)  10/17/21 172 lb 9.6 oz (78.3 kg)    Cognitive Testing  Alert? Yes  Normal Appearance?Yes  Oriented to person? Yes  Place? Yes   Time? Yes  Recall of three objects?  Yes  Can perform simple calculations? Yes  Displays appropriate judgment?Yes  Can read the correct time from a watch face?Yes  General appearance: alert, no distress, WD/WN, African American female  Nutritional Status: Inadequate calore intake? no Loss of muscle mass? no Loss of fat beneath skin? no Localized or general edema? no Diminished functional status? no  Other pertinent exam: HEENT: normocephalic, sclerae anicteric, TMs pearly, nares patent, no discharge or erythema, pharynx normal Oral cavity: MMM, no lesions Neck: supple, no lymphadenopathy, no thyromegaly, no masses Heart: RRR, normal S1, S2, no murmurs Lungs: CTA bilaterally, no wheezes, rhonchi, or rales Abdomen: +bs, soft, non tender, non distended, no masses, no hepatomegaly, no splenomegaly Musculoskeletal: nontender, no swelling, no obvious deformity Extremities: no edema, no cyanosis, no clubbing Pulses: 2+ symmetric, upper and  1+ lower extremities, normal cap refill Neurological: alert, oriented x 3, CN2-12 intact, strength normal upper extremities and lower extremities, sensation normal throughout, DTRs 2+ throughout, no cerebellar signs, gait normal Psychiatric: normal affect, behavior normal, pleasant  Breast/Gyn - deferred  Diabetic Foot Exam - Simple   Simple Foot Form Diabetic Foot exam was performed with the following findings: Yes 04/17/2022  4:27 PM  Visual Inspection No deformities, no ulcerations, no other skin breakdown bilaterally: Yes Sensation Testing Intact to touch and monofilament testing bilaterally: Yes Pulse Check See comments: Yes Comments 1+ pedal pulses      Assessment:   Encounter Diagnoses  Name Primary?   Essential hypertension Yes   Medicare annual wellness visit, subsequent     Needs flu shot    Type 2 diabetes, controlled, with peripheral neuropathy ([place])    Mixed hyperlipidemia    Chronic obstructive pulmonary disease, unspecified COPD type ([place])    Coronary artery disease involving native coronary artery of native heart without angina pectoris    CKD (chronic kidney disease) stage 4, GFR 15-29 ml/min (HCC)    Chronic combined systolic (congestive) and diastolic (congestive) heart failure (HCC)    Urge incontinence of urine    Pulmonary hypertension, unspecified (HCC)    PAH (pulmonary artery hypertension) (HCC)    OSA on CPAP    On home O2    Iron deficiency anemia, unspecified iron deficiency anemia type    ICD (implantable cardioverter-defibrillator) in place      Plan:    A preventative services visit was completed today.  During the course of the visit today, we discussed and counseled about appropriate screening and preventive services.  A health risk assessment was established today that included a review of current medications, allergies, social history, family history, medical and preventative health history, biometrics, and preventative screenings to identify potential safety concerns or impairments.  This visit was a preventative care visit, also known as wellness visit or routine physical.   Topics typically include healthy lifestyle, diet, exercise, preventative care, vaccinations, sick and well care, proper use of emergency dept and after hours care, as well as other concerns.     Recommendations: Continue to return yearly for your annual wellness and preventative care visits.  This gives [place]  a chance to discuss healthy lifestyle, exercise, vaccinations, review your chart record, and perform screenings where appropriate.  I recommend you see your eye doctor yearly for routine vision care.  I recommend you see your dentist yearly for routine dental care including hygiene visits twice yearly.   Vaccination recommendations were  reviewed Immunization History  Administered Date(s) Administered   Fluad Quad(high Dose 65+) 04/04/2019, 04/15/2020, 04/16/2021, 04/17/2022   Influenza, High Dose Seasonal PF 08/04/2016, 05/02/2017, 04/06/2018   Influenza-Unspecified 05/23/2013, 08/25/2013, 06/22/2014, 06/22/2014, 05/22/2015   PFIZER(Purple Top)SARS-COV-2 Vaccination 09/12/2019, 10/03/2019, 04/30/2020   Pfizer Covid-19 Vaccine Bivalent Booster 38yr & up 05/19/2021   Pneumococcal Conjugate-13 06/22/2014   Pneumococcal-Unspecified 01/12/2008   Tdap 01/12/2008   Zoster, Live 07/21/2011, 12/08/2011   Vaccine recommendations:  We updated you yearly flu shot today  At the pharmacy, get the following vaccines at your convenience: Shingrix #2 Covid new booster Tdap tetanus pertussis  However, do each of these separately, apart from each other by at least 2 weeks.  At your convenience, you can return here for the Prevnar 20 updated pneumococcal vaccine.    Screening for cancer: Reviewed common cancer screens, but after age [id number] cancer screens are not continued per USPSTF guidelines   Skin cancer screening: Check your skin regularly for new changes, growing lesions, or other lesions of concern Come in for evaluation if you have skin lesions of concern.  Lung cancer screening: If you have a greater than 20 pack year history of tobacco use, then you may qualify for lung cancer screening with a chest CT scan.   Please call your insurance company to inquire about coverage for this test.  We currently don't have screenings for other cancers besides breast, cervical, colon, and lung cancers.  If you have a strong family history of cancer or have other cancer screening concerns, please let me know.    Bone health: Get at least 150 minutes of aerobic exercise weekly Get weight bearing exercise at least once weekly Bone density test:  A bone density test is an imaging test that uses a type of X-ray to measure the amount  of calcium and other minerals in your bones. The test may be used to diagnose or screen you for a condition that causes weak or thin bones (osteoporosis), predict your risk for a broken bone (fracture), or determine how well your osteoporosis treatment is working. The bone density test is recommended for females 673and older, or females or males <<61if certain risk factors such as thyroid disease, long term use of steroids such as for asthma or rheumatological issues, vitamin D deficiency, estrogen deficiency, family history of osteoporosis, self or family history of fragility fracture in first degree relative.   Please call to schedule your bone density test.   [facility] [phone] [id number] [place] [id number]  Heart health: Get at least 150 minutes of aerobic exercise weekly Limit alcohol It is important to maintain a healthy blood pressure and healthy cholesterol numbers  Heart disease screening: Screening for heart disease includes screening for blood pressure, fasting lipids, glucose/diabetes screening, BMI height to weight ratio, reviewed of smoking status, physical activity, and diet.    Goals include blood pressure 120/80 or less, maintaining a healthy lipid/cholesterol profile, preventing diabetes or keeping diabetes numbers under good control, not smoking or using tobacco products, exercising most days per week or at least 150 minutes per week of exercise, and eating  healthy variety of fruits and vegetables, healthy oils, and avoiding unhealthy food choices like fried food, fast food, high sugar and high cholesterol foods.    Continue routine follow up with cardiology    Medical care options: I recommend you continue to seek care here first for routine care.  We try really hard to have available appointments Monday through Friday daytime hours for sick visits, acute visits, and physicals.  Urgent care should be used for  after hours and weekends for significant issues that cannot wait till the next day.  The emergency department should be used for significant potentially life-threatening emergencies.  The emergency department is expensive, can often have long wait times for less significant concerns, so try to utilize primary care, urgent care, or telemedicine when possible to avoid unnecessary trips to the emergency department.  Virtual visits and telemedicine have been introduced since the pandemic started in 2020, and can be convenient ways to receive medical care.  We offer virtual appointments as well to assist you in a variety of options to seek medical care.   Advanced Directives: I recommend you consider completing a [place] and Living Will.   These documents respect your wishes and help alleviate burdens on your loved ones if you were to become terminally ill or be in a position to need those documents enforced.    You can complete Advanced Directives yourself, have them notarized, then have copies made for our office, for you and for anybody you feel should have them in safe keeping.  Or, you can have an attorney prepare these documents.   If you haven't updated your Last Will and Testament in a while, it may be worthwhile having an attorney prepare these documents together and save on some costs.       Separate Significant Issues: Diabetes - currently diet controlled.  Labs today  Cardiomyopathy and HTN, history of heart failure - managed by cardiology  CAD, hyperlipidemia - continue statin, Aspirin  Urinary incontinence - on oxybutynin  CKD - sees nephrology, reviewed 07/2021 notes  OSA - compliant with CPAP  Iron deficiency - on iron daily  Pulmonary hypertension - sees pulmonology and cardiology   Adelita was seen today for awv.  Diagnoses and all orders for this visit:  Essential hypertension  Medicare annual wellness visit, subsequent  Needs flu shot -     Flu  Vaccine QUAD High Dose(Fluad)  Type 2 diabetes, controlled, with peripheral neuropathy (HCC) -     Hemoglobin A1c  Mixed hyperlipidemia -     Lipid panel  Chronic obstructive pulmonary disease, unspecified COPD type ([place])  Coronary artery disease involving native coronary artery of native heart without angina pectoris  CKD (chronic kidney disease) stage 4, GFR 15-29 ml/min (HCC) -     VITAMIN D 25 Hydroxy (Vit-D Deficiency, Fractures) -     Renal Function Panel  Chronic combined systolic (congestive) and diastolic (congestive) heart failure (HCC) -     Gamma GT -     Hepatic function panel  Urge incontinence of urine  Pulmonary hypertension, unspecified (HCC)  PAH (pulmonary artery hypertension) (HCC)  OSA on CPAP  On home O2  Iron deficiency anemia, unspecified iron deficiency anemia type  ICD (implantable cardioverter-defibrillator) in place        Medicare Attestation A preventative services visit was completed today.  During the course of the visit the patient was educated and counseled about appropriate screening and preventive services.  A health  risk assessment was established with the patient that included a review of current medications, allergies, social history, family history, medical and preventative health history, biometrics, and preventative screenings to identify potential safety concerns or impairments.  A personalized plan was printed today for the patient's records and use.   Personalized health advice and education was given today to reduce health risks and promote self management and wellness.  Information regarding end of life planning was discussed today.  Dorothea Ogle, PA-C   04/17/2022

## 2022-04-18 DIAGNOSIS — I5089 Other heart failure: Secondary | ICD-10-CM | POA: Diagnosis not present

## 2022-04-18 DIAGNOSIS — G4733 Obstructive sleep apnea (adult) (pediatric): Secondary | ICD-10-CM | POA: Diagnosis not present

## 2022-04-18 DIAGNOSIS — J449 Chronic obstructive pulmonary disease, unspecified: Secondary | ICD-10-CM | POA: Diagnosis not present

## 2022-04-18 DIAGNOSIS — I10 Essential (primary) hypertension: Secondary | ICD-10-CM | POA: Diagnosis not present

## 2022-04-18 LAB — RENAL FUNCTION PANEL
Albumin: 4.9 g/dL — ABNORMAL HIGH (ref 3.7–4.7)
BUN/Creatinine Ratio: 22 (ref 12–28)
BUN: 40 mg/dL — ABNORMAL HIGH (ref 8–27)
CO2: 22 mmol/L (ref 20–29)
Calcium: 9.7 mg/dL (ref 8.7–10.3)
Chloride: 104 mmol/L (ref 96–106)
Creatinine, Ser: 1.81 mg/dL — ABNORMAL HIGH (ref 0.57–1.00)
Glucose: 104 mg/dL — ABNORMAL HIGH (ref 70–99)
Phosphorus: 3.7 mg/dL (ref 3.0–4.3)
Potassium: 4.2 mmol/L (ref 3.5–5.2)
Sodium: 146 mmol/L — ABNORMAL HIGH (ref 134–144)
eGFR: 27 mL/min/{1.73_m2} — ABNORMAL LOW (ref >59)

## 2022-04-18 LAB — HEPATIC FUNCTION PANEL
ALT: 8 IU/L (ref 0–32)
AST: 17 IU/L (ref 0–40)
Alkaline Phosphatase: 138 IU/L — ABNORMAL HIGH (ref 44–121)
Bilirubin Total: 0.6 mg/dL (ref 0.0–1.2)
Bilirubin, Direct: 0.22 mg/dL (ref 0.00–0.40)
Total Protein: 7.9 g/dL (ref 6.0–8.5)

## 2022-04-18 LAB — LIPID PANEL
Chol/HDL Ratio: 1.5 ratio (ref 0.0–4.4)
Cholesterol, Total: 164 mg/dL (ref 100–199)
HDL: 108 mg/dL (ref >39)
LDL Chol Calc (NIH): 46 mg/dL (ref 0–99)
Triglycerides: 48 mg/dL (ref 0–149)
VLDL Cholesterol Cal: 10 mg/dL (ref 5–40)

## 2022-04-18 LAB — VITAMIN D 25 HYDROXY (VIT D DEFICIENCY, FRACTURES): Vit D, 25-Hydroxy: 27.8 ng/mL — ABNORMAL LOW (ref 30.0–100.0)

## 2022-04-18 LAB — GAMMA GT: GGT: 20 IU/L (ref 0–60)

## 2022-04-18 LAB — HEMOGLOBIN A1C
Est. average glucose Bld gHb Est-mCnc: 134 mg/dL
Hgb A1c MFr Bld: 6.3 % — ABNORMAL HIGH (ref 4.8–5.6)

## 2022-04-20 ENCOUNTER — Other Ambulatory Visit: Payer: Self-pay | Admitting: Medical

## 2022-04-20 ENCOUNTER — Other Ambulatory Visit: Payer: Self-pay | Admitting: Internal Medicine

## 2022-04-20 MED ORDER — VITAMIN D (ERGOCALCIFEROL) 1.25 MG (50000 UNIT) PO CAPS: 50000.0000 [IU] | ORAL_CAPSULE | ORAL | 0 refills | Status: DC

## 2022-04-20 MED ORDER — ATORVASTATIN CALCIUM 40 MG PO TABS: 40.0000 mg | ORAL_TABLET | Freq: Every day | ORAL | 3 refills | Status: DC

## 2022-04-28 ENCOUNTER — Encounter: Payer: Self-pay | Admitting: Internal Medicine

## 2022-05-01 ENCOUNTER — Ambulatory Visit: Payer: Medicare PPO | Admitting: Podiatry

## 2022-05-01 DIAGNOSIS — M79675 Pain in left toe(s): Secondary | ICD-10-CM

## 2022-05-01 DIAGNOSIS — L84 Corns and callosities: Secondary | ICD-10-CM | POA: Diagnosis not present

## 2022-05-01 DIAGNOSIS — E1142 Type 2 diabetes mellitus with diabetic polyneuropathy: Secondary | ICD-10-CM

## 2022-05-01 DIAGNOSIS — B351 Tinea unguium: Secondary | ICD-10-CM

## 2022-05-01 DIAGNOSIS — B353 Tinea pedis: Secondary | ICD-10-CM | POA: Diagnosis not present

## 2022-05-01 DIAGNOSIS — M79674 Pain in right toe(s): Secondary | ICD-10-CM

## 2022-05-01 MED ORDER — KETOCONAZOLE 2 % EX CREA: TOPICAL_CREAM | CUTANEOUS | 1 refills | Status: DC

## 2022-05-01 NOTE — Progress Notes (Unsigned)
  Subjective:  Patient ID: [name], female    DOB: 07-Nov-1940,  MRN: [id number]  [name] presents to clinic today for:  Chief Complaint  Patient presents with   Nail Problem    Diabetic foot care BS-100 A1C-100 PCP-Tysinger PCP VST-04/17/22   New problem(s): None.   She is accompanied by her daughter, [name], on today's visit.  PCP is Irene Pap, PA-C.  Allergies  Allergen Reactions   Benazepril Hcl Other (See Comments)    Unknown   Lotrel [Amlodipine Besy-Benazepril Hcl] Other (See Comments)    Hypotension    Talwin [Pentazocine] Other (See Comments)    Hallucinations      Review of Systems: Negative except as noted in the HPI.  Objective: No changes noted in today's physical examination.  [name] is a pleasant 81 y.o. female WD, WN in NAD. AAO x 3. Vascular CFT <3 seconds b/l LE. Faintly palpable DP pulses b/l LE. Faintly palpable PT pulse(s) b/l LE. Pedal hair absent. No pain with calf compression b/l. Lower extremity skin temperature gradient within normal limits. No edema noted b/l LE. No ischemia or gangrene noted b/l LE. No cyanosis or clubbing noted b/l LE.  Neurologic Normal speech. Oriented to person, place, and time. Protective sensation decreased with 10 gram monofilament b/l. Vibratory sensation intact b/l.  Dermatologic Pedal skin is warm and supple b/l LE. No open wounds b/l LE. No interdigital macerations noted b/l LE.   Diffuse scaling noted peripherally and plantarly b/l feet.  No interdigital macerations.  No blisters, no weeping. No signs of secondary bacterial infection noted.  Toenails 1-5 b/l elongated, discolored, dystrophic, thickened, crumbly with subungual debris and tenderness to dorsal palpation.   Hyperkeratotic lesion(s) submet head 5 lbilaterally. No erythema, no edema, no drainage, no fluctuance.  Orthopedic: Muscle strength 5/5 to all lower extremity muscle groups bilaterally. HAV with bunion deformity  noted b/l LE. Hammertoe deformity noted 2-5 b/l. Patient ambulates independent of any assistive aids.   Radiographs: None Assessment/Plan: 1. Pain due to onychomycosis of toenails of both feet   2. Callus   3. Tinea pedis of both feet   4. Diabetic peripheral neuropathy associated with type 2 diabetes mellitus ([employer])     Meds ordered this encounter  Medications   ketoconazole (NIZORAL) 2 % cream    Sig: Apply to both feet and between toes once daily for 6 weeks.    Dispense:  60 g    Refill:  1    -Patient's family member present. All questions/concerns addressed on today's visit. -Examined patient. -Continue foot and shoe inspections daily. Monitor blood glucose per PCP/Endocrinologist's recommendations. -Patient to continue soft, supportive shoe gear daily. -Toenails 1-5 b/l were debrided in length and girth with sterile nail nippers and dremel without iatrogenic bleeding.  -Callus(es) submet head 5 b/l pared utilizing sterile scalpel blade without complication or incident. Total number debrided =2. -Discussed tinea pedis infection. To prevent re-infection of tinea pedis, patient/POA/caregiver instructed to spray shoes with Lysol every evening and clean tub/shower with bleach based cleanser. -For tinea pedis, Rx sent to pharmacy for Ketoconazole Cream 2% to be applied once daily for six weeks. -Patient/POA to call should there be question/concern in the interim.   Return in about 3 months (around 08/01/2022).  Marzetta Board, DPM

## 2022-05-04 ENCOUNTER — Encounter: Payer: Self-pay | Admitting: Podiatry

## 2022-05-06 ENCOUNTER — Encounter: Payer: Self-pay | Admitting: Medical

## 2022-05-06 ENCOUNTER — Other Ambulatory Visit: Payer: Self-pay | Admitting: *Deleted

## 2022-05-06 MED ORDER — TORSEMIDE 20 MG PO TABS: 60.0000 mg | ORAL_TABLET | Freq: Every day | ORAL | 3 refills | Status: DC | PRN

## 2022-05-18 DIAGNOSIS — I10 Essential (primary) hypertension: Secondary | ICD-10-CM | POA: Diagnosis not present

## 2022-05-18 DIAGNOSIS — I5089 Other heart failure: Secondary | ICD-10-CM | POA: Diagnosis not present

## 2022-05-18 DIAGNOSIS — J449 Chronic obstructive pulmonary disease, unspecified: Secondary | ICD-10-CM | POA: Diagnosis not present

## 2022-05-18 DIAGNOSIS — G4733 Obstructive sleep apnea (adult) (pediatric): Secondary | ICD-10-CM | POA: Diagnosis not present

## 2022-05-20 ENCOUNTER — Ambulatory Visit (INDEPENDENT_AMBULATORY_CARE_PROVIDER_SITE_OTHER): Payer: Medicare PPO

## 2022-05-20 DIAGNOSIS — H02834 Dermatochalasis of left upper eyelid: Secondary | ICD-10-CM | POA: Diagnosis not present

## 2022-05-20 DIAGNOSIS — H02831 Dermatochalasis of right upper eyelid: Secondary | ICD-10-CM | POA: Diagnosis not present

## 2022-05-20 DIAGNOSIS — E119 Type 2 diabetes mellitus without complications: Secondary | ICD-10-CM | POA: Diagnosis not present

## 2022-05-20 DIAGNOSIS — E1142 Type 2 diabetes mellitus with diabetic polyneuropathy: Secondary | ICD-10-CM

## 2022-05-20 DIAGNOSIS — M2011 Hallux valgus (acquired), right foot: Secondary | ICD-10-CM

## 2022-05-20 DIAGNOSIS — H43811 Vitreous degeneration, right eye: Secondary | ICD-10-CM | POA: Diagnosis not present

## 2022-05-20 DIAGNOSIS — M2012 Hallux valgus (acquired), left foot: Secondary | ICD-10-CM

## 2022-05-20 DIAGNOSIS — Z961 Presence of intraocular lens: Secondary | ICD-10-CM | POA: Diagnosis not present

## 2022-05-20 LAB — HM DIABETES EYE EXAM

## 2022-05-20 NOTE — Progress Notes (Signed)
Patient presents to the office today for diabetic shoe and insole measuring.  Patient was measured with brannock device to determine size and width for 1 pair of extra depth shoes and foam casted for 3 pair of insoles.   Documentation of medical necessity will be sent to patient's treating diabetic doctor to verify and sign.   Patient's diabetic provider:David Dorothea Ogle, PA   Shoes and insoles will be ordered at that time and patient will be notified for an appointment for fitting when they arrive.   Shoe size (per patient): 10 medium women's   Brannock measurement: Left foot 9 and Right 9.5 medium  Patient shoe selection-   Shoe choice:   [id number] (1st choice)                         A720W (2nd choice)  Shoe size ordered: 9.5 medium women's

## 2022-05-21 ENCOUNTER — Encounter: Payer: Self-pay | Admitting: Internal Medicine

## 2022-06-02 ENCOUNTER — Encounter: Payer: Self-pay | Admitting: Internal Medicine

## 2022-06-03 ENCOUNTER — Telehealth: Payer: Self-pay

## 2022-06-03 ENCOUNTER — Ambulatory Visit (INDEPENDENT_AMBULATORY_CARE_PROVIDER_SITE_OTHER): Payer: Medicare PPO

## 2022-06-03 DIAGNOSIS — Z95810 Presence of automatic (implantable) cardiac defibrillator: Secondary | ICD-10-CM

## 2022-06-03 DIAGNOSIS — I428 Other cardiomyopathies: Secondary | ICD-10-CM

## 2022-06-03 LAB — CUP PACEART REMOTE DEVICE CHECK
Battery Remaining Longevity: 7 mo
Battery Voltage: 2.85 V
Brady Statistic AP VP Percent: 0.41 %
Brady Statistic AP VS Percent: 60.34 %
Brady Statistic AS VP Percent: 0.1 %
Brady Statistic AS VS Percent: 39.14 %
Brady Statistic RA Percent Paced: 59.94 %
Brady Statistic RV Percent Paced: 0.57 %
Date Time Interrogation Session: 20231115022504
HighPow Impedance: 39 Ohm
HighPow Impedance: 54 Ohm
Implantable Pulse Generator Implant Date: 20131120
Lead Channel Impedance Value: 304 Ohm
Lead Channel Impedance Value: 342 Ohm
Lead Channel Impedance Value: 380 Ohm
Lead Channel Pacing Threshold Amplitude: 0.75 V
Lead Channel Pacing Threshold Amplitude: 0.875 V
Lead Channel Pacing Threshold Pulse Width: 0.4 ms
Lead Channel Pacing Threshold Pulse Width: 0.4 ms
Lead Channel Sensing Intrinsic Amplitude: 0.625 mV
Lead Channel Sensing Intrinsic Amplitude: 0.625 mV
Lead Channel Sensing Intrinsic Amplitude: 3.5 mV
Lead Channel Sensing Intrinsic Amplitude: 3.5 mV
Lead Channel Setting Pacing Amplitude: 1.75 V
Lead Channel Setting Pacing Amplitude: 2 V
Lead Channel Setting Pacing Pulse Width: 0.4 ms
Lead Channel Setting Sensing Sensitivity: 0.3 mV
Zone Setting Status: 755011
Zone Setting Status: 755011

## 2022-06-03 NOTE — Telephone Encounter (Signed)
ICD estimates 7 months left on battery.

## 2022-06-05 ENCOUNTER — Telehealth: Payer: Self-pay | Admitting: Medical

## 2022-06-05 NOTE — Telephone Encounter (Signed)
I have sent upfront to have it faxed and scan

## 2022-06-05 NOTE — Telephone Encounter (Signed)
[name], I received a request for signing off on diabetic shoes from Dr. Adah Perl addressed to Dr. Tomi Bamberger.  I saw her most recently for diabetes.   Please scan and fax form for diabetic shoes

## 2022-06-18 DIAGNOSIS — I10 Essential (primary) hypertension: Secondary | ICD-10-CM | POA: Diagnosis not present

## 2022-06-18 DIAGNOSIS — J449 Chronic obstructive pulmonary disease, unspecified: Secondary | ICD-10-CM | POA: Diagnosis not present

## 2022-06-18 DIAGNOSIS — G4733 Obstructive sleep apnea (adult) (pediatric): Secondary | ICD-10-CM | POA: Diagnosis not present

## 2022-06-18 DIAGNOSIS — I5089 Other heart failure: Secondary | ICD-10-CM | POA: Diagnosis not present

## 2022-06-22 ENCOUNTER — Telehealth: Payer: Self-pay | Admitting: Podiatry

## 2022-06-22 NOTE — Telephone Encounter (Signed)
No answer vm is full, patients diabetic shoes are in , needs to be scheduled for appt

## 2022-06-26 NOTE — Progress Notes (Signed)
Remote ICD transmission.   

## 2022-07-01 ENCOUNTER — Ambulatory Visit (INDEPENDENT_AMBULATORY_CARE_PROVIDER_SITE_OTHER): Payer: Medicare PPO

## 2022-07-01 DIAGNOSIS — E1142 Type 2 diabetes mellitus with diabetic polyneuropathy: Secondary | ICD-10-CM

## 2022-07-01 DIAGNOSIS — M2011 Hallux valgus (acquired), right foot: Secondary | ICD-10-CM

## 2022-07-01 DIAGNOSIS — M2012 Hallux valgus (acquired), left foot: Secondary | ICD-10-CM

## 2022-07-01 NOTE — Progress Notes (Signed)
Patient presents today to pick up diabetic shoes and insoles.  Patient was dispensed 1 pair of diabetic shoes and 3 pairs of foam casted diabetic insoles. Fit was satisfactory. Instructions for break-in and wear was reviewed and a copy was given to the patient.   Re-appointment for regularly scheduled diabetic foot care visits or if they should experience any trouble with the shoes or insoles.  

## 2022-07-06 ENCOUNTER — Ambulatory Visit (INDEPENDENT_AMBULATORY_CARE_PROVIDER_SITE_OTHER): Payer: Medicare PPO

## 2022-07-06 DIAGNOSIS — I428 Other cardiomyopathies: Secondary | ICD-10-CM

## 2022-07-07 LAB — CUP PACEART REMOTE DEVICE CHECK
Battery Remaining Longevity: 8 mo
Battery Voltage: 2.84 V
Brady Statistic AP VP Percent: 0.25 %
Brady Statistic AP VS Percent: 74.87 %
Brady Statistic AS VP Percent: 0.03 %
Brady Statistic AS VS Percent: 24.86 %
Brady Statistic RA Percent Paced: 73.08 %
Brady Statistic RV Percent Paced: 0.34 %
Date Time Interrogation Session: 20231218063428
HighPow Impedance: 40 Ohm
HighPow Impedance: 54 Ohm
Implantable Pulse Generator Implant Date: 20131120
Lead Channel Impedance Value: 266 Ohm
Lead Channel Impedance Value: 380 Ohm
Lead Channel Impedance Value: 380 Ohm
Lead Channel Pacing Threshold Amplitude: 0.75 V
Lead Channel Pacing Threshold Amplitude: 0.75 V
Lead Channel Pacing Threshold Pulse Width: 0.4 ms
Lead Channel Pacing Threshold Pulse Width: 0.4 ms
Lead Channel Sensing Intrinsic Amplitude: 0.5 mV
Lead Channel Sensing Intrinsic Amplitude: 0.5 mV
Lead Channel Sensing Intrinsic Amplitude: 3.375 mV
Lead Channel Sensing Intrinsic Amplitude: 3.375 mV
Lead Channel Setting Pacing Amplitude: 1.5 V
Lead Channel Setting Pacing Amplitude: 2 V
Lead Channel Setting Pacing Pulse Width: 0.4 ms
Lead Channel Setting Sensing Sensitivity: 0.3 mV
Zone Setting Status: 755011
Zone Setting Status: 755011

## 2022-07-15 ENCOUNTER — Other Ambulatory Visit: Payer: Self-pay | Admitting: Medical

## 2022-07-15 DIAGNOSIS — Z1231 Encounter for screening mammogram for malignant neoplasm of breast: Secondary | ICD-10-CM

## 2022-07-18 DIAGNOSIS — I10 Essential (primary) hypertension: Secondary | ICD-10-CM | POA: Diagnosis not present

## 2022-07-18 DIAGNOSIS — G4733 Obstructive sleep apnea (adult) (pediatric): Secondary | ICD-10-CM | POA: Diagnosis not present

## 2022-07-18 DIAGNOSIS — I5089 Other heart failure: Secondary | ICD-10-CM | POA: Diagnosis not present

## 2022-07-18 DIAGNOSIS — J449 Chronic obstructive pulmonary disease, unspecified: Secondary | ICD-10-CM | POA: Diagnosis not present

## 2022-07-21 ENCOUNTER — Ambulatory Visit: Payer: Medicare PPO

## 2022-07-25 ENCOUNTER — Other Ambulatory Visit: Payer: Self-pay | Admitting: Physician Assistant

## 2022-07-28 ENCOUNTER — Other Ambulatory Visit: Payer: Self-pay | Admitting: Physician Assistant

## 2022-08-07 DIAGNOSIS — G4733 Obstructive sleep apnea (adult) (pediatric): Secondary | ICD-10-CM | POA: Diagnosis not present

## 2022-08-07 DIAGNOSIS — J449 Chronic obstructive pulmonary disease, unspecified: Secondary | ICD-10-CM | POA: Diagnosis not present

## 2022-08-07 DIAGNOSIS — I10 Essential (primary) hypertension: Secondary | ICD-10-CM | POA: Diagnosis not present

## 2022-08-07 DIAGNOSIS — I5089 Other heart failure: Secondary | ICD-10-CM | POA: Diagnosis not present

## 2022-08-10 ENCOUNTER — Encounter: Payer: Self-pay | Admitting: Cardiovascular Disease

## 2022-08-10 ENCOUNTER — Ambulatory Visit: Payer: Medicare PPO | Attending: Cardiovascular Disease | Admitting: Cardiovascular Disease

## 2022-08-10 VITALS — BP 128/62 | HR 95 | Ht 61.0 in | Wt 171.6 lb

## 2022-08-10 DIAGNOSIS — I4719 Other supraventricular tachycardia: Secondary | ICD-10-CM | POA: Diagnosis not present

## 2022-08-10 DIAGNOSIS — Z95810 Presence of automatic (implantable) cardiac defibrillator: Secondary | ICD-10-CM | POA: Diagnosis not present

## 2022-08-10 DIAGNOSIS — T82198D Other mechanical complication of other cardiac electronic device, subsequent encounter: Secondary | ICD-10-CM

## 2022-08-10 DIAGNOSIS — I5042 Chronic combined systolic (congestive) and diastolic (congestive) heart failure: Secondary | ICD-10-CM | POA: Diagnosis not present

## 2022-08-10 DIAGNOSIS — I2721 Secondary pulmonary arterial hypertension: Secondary | ICD-10-CM

## 2022-08-10 DIAGNOSIS — E1142 Type 2 diabetes mellitus with diabetic polyneuropathy: Secondary | ICD-10-CM | POA: Diagnosis not present

## 2022-08-10 DIAGNOSIS — E7800 Pure hypercholesterolemia, unspecified: Secondary | ICD-10-CM | POA: Diagnosis not present

## 2022-08-10 DIAGNOSIS — I10 Essential (primary) hypertension: Secondary | ICD-10-CM

## 2022-08-10 NOTE — Patient Instructions (Signed)
Medication Instructions:  No changes *If you need a refill on your cardiac medications before your next appointment, please call your pharmacy*  Follow-Up: At [facility], you and your health needs are our priority.  As part of our continuing mission to provide you with exceptional heart care, we have created designated Provider Care Teams.  These Care Teams include your primary Cardiologist (physician) and Advanced Practice Providers (APPs -  Physician Assistants and Nurse Practitioners) who all work together to provide you with the care you need, when you need it.  We recommend signing up for the patient portal called "MyChart".  Sign up information is provided on this After Visit Summary.  MyChart is used to connect with patients for Virtual Visits (Telemedicine).  Patients are able to view lab/test results, encounter notes, upcoming appointments, etc.  Non-urgent messages can be sent to your provider as well.   To learn more about what you can do with MyChart, go to [email or web address].    Your next appointment:   5 month(s)  Provider:   Sanda Klein, MD

## 2022-08-12 NOTE — Progress Notes (Signed)
Remote ICD transmission.   

## 2022-08-12 NOTE — Addendum Note (Signed)
Addended by: Douglass Rivers D on: 08/12/2022 03:25 PM   Modules accepted: Level of Service

## 2022-08-13 ENCOUNTER — Encounter: Payer: Self-pay | Admitting: Cardiovascular Disease

## 2022-08-13 NOTE — Progress Notes (Signed)
Cardiology Office Note:    Date:  08/13/2022   ID:  PETE MERTEN, DOB 11-Jun-1941, MRN [id number]  PCP:  Carlena Hurl, PA-C  Cardiologist:  Sanda Klein, MD   Referring MD: Marcellina Millin   Chief complaint: CHF, ICD  History of Present Illness:    [name] is a 82 y.o. female with a hx of nonischemic cardiomyopathy (previous EF 35%, most recent EF 45-50%), minor nonobstructive coronary artery disease (last left heart cath July 2021), status post defibrillator ([employer]tronic [place] dual-chamber, implanted 2013, Sprint [place] 6949-lead under advisory), severe pulmonary hypertension, obstructive sleep apnea, restrictive lung disease, essential hypertension, hypercholesterolemia type 2 diabetes mellitus, which is currently diet controlled.  She has no cardiac complaints.  Although she is very sedentary she is still able to participate in several social gatherings, go shopping with her family, take care of her personal hygiene without significant shortness of breath.  Intermittently uses oxygen 2 L by nasal cannula during more intense activity.  Weight on her home scale has been around 165 pounds (corresponds to 170 pounds on our office scale).  She does not have orthopnea, PND, lower extremity edema, chest pain at rest or with activity, dizziness, syncope or palpitations.  She has not required hospitalization or change in diuretic doses since her admission in summer of 2021.  Device interrogation shows normal function of her defibrillator, but anticipate RRT to be reached in about 6 months.  She has 59% atrial pacing and only 0.5% ventricular pacing.  The underlying rhythm today is sinus bradycardia at 45 bpm.  She has a handful of episodes of nonsustained ventricular tachycardia, all of them less than a second in duration.  The device has recorded episodes of "atrial fibrillation" which are actually due to premature atrial contractions and oversensing.  She has rather small  sensed P waves at only 0.5-0.6 mV which limits her ability to address sensitivity.  Otherwise lead parameters are good.  Most recent liver function tests have been normal and her most recent creatinine is 1.8, worse than her usual baseline.  Her nephrologist is Dr. [name] and her gastroenterologist is Dr. Havery Moros.    Right heart cath in 2021 showed severe pulmonary artery hypertension with normal left-sided pressures and preserved cardiac output.  Treatment with sildenafil and amlodipine was stopped due to hypotension.  A follow-up right heart catheterization in April 2022 showed a mean PA pressure of 33 mmHg, normal left ventricular and right ventricular filling pressures and normal cardiac index of 2.66.   She was admitted for congestive heart failure with gradual onset of hypervolemia in July 2021 (possibly related to inability to comply with CPAP due to defective equipment). She had marked hypoxemia, requiring 5-6 L/minute oxygen for quite a while despite aggressive diuresis. She underwent right heart catheterization with the following findings.  Findings:   RA = 20 RV = 72/27 PA = 79/44 (53) PCW = 18 Fick cardiac output/index = 5.35/2.9 Thermo CO/CI = 4.4/2.4 PVR = 6.5 (Fick) 8.0 (Thermo) Ao sat = 94% PA sat = 64%, 64% SVC sat = 61%   Assessment: 1. Severe PAH with normal left-sided pressures   Plan/Discussion:   There is near equalization or diastolic pressures which can reflect restrictive physiology or elevated R-sided pressure with normal left pressures. Given relatively normal PCWP - restriction felt to be less likely.   Treatment of PDE 5 inhibitors was initiated and seems to be well-tolerated and allowed additional diuresis. She was discharged to a rehab  facility where she quickly reaccumulated fluid and rehospitalized. She responded to repeat treatment with diuretics. It was suspected that the sildenafil may have contributed to worsening left heart failure and this  medication was then discontinued.  High resolution chest CT repeated in July 2021 did not show evidence of fibrotic lung disease. Nevertheless, the PFTs are currently showing restrictive lung disease with FVC around 65% of predicted and, certain reduction in FEV1.  Her last cardiac catheterization was in December 2015 and described "mild plaquing and no obstructive CAD" in the left main and circumflex coronary artery, while the LAD and RCA were described as normal.  At that time the EF was 35-45% by ventriculography in the left ventricular end-diastolic pressure was 30 mmHg.  The mean pulmonary artery pressure was 25 mmHg.  The pulmonary artery pressure was 90/23 mmHg (with systemic blood pressure 168/66).  Carotid duplex ultrasound performed in October 2017 showed minor plaque in both carotids and antegrade flow in both vertebral arteries.  Her device shows occasional relatively brief episodes of atrial mode switch, most commonly paroxysmal atrial tachycardia or slow atrial flutter with varying degrees of AV block. High ventricular rates are not seen. The longest episode lasted for about an hour and occurred in May 2021. Lead parameters are normal.  As mentioned her ventricular lead is a Sprint Fidelis [id number] under advisory but shows no evidence of SIC.  Her cardiologist in [place] for many years was Dr. Elberta Spaniel, most recently Dr. Barnie Alderman  She bears a diagnosis of restrictive lung disease and obstructive sleep apnea.  She is supposed to be on home oxygen.  She uses CPAP for severe obstructive sleep apnea with an apnea hypotony index of 59 and desaturations down to 80%.  Despite treatment with CPAP it has been very difficult to control her AHI.  She plans to follow-up with Dr. Halford Chessman or with Dr. Elsworth Soho in the pulmonary clinic.  The report stated that prior PFTs showed no obstruction but fairly significant restriction and reduced DLCO but repeat PFTs today were improved, CT chest was unremarkable  and the feeling was that her initial PFTs may have not been accurate.  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  She had a pulmonary embolism 40 years ago.  EGD performed in 2011 showed mild erosive esophagitis and gastritis.  She was prescribed Protonix.  She has a remote history of depression for which she is not currently receiving any medications.  She has never smoked and denies use of alcohol.     Past Medical History:  Diagnosis Date   Anemia    CAD (coronary artery disease)    Cardiac defibrillator in situ    Cataract    bilateral 2017   CHF (congestive heart failure) (HCC)    Chronic systolic heart failure (HCC)    CKD (chronic kidney disease) stage 4, GFR 15-29 ml/min (HCC)    COPD (chronic obstructive pulmonary disease) ([place])    Diabetes mellitus type 2, diet-controlled (HCC)    Dyspnea    HTN (hypertension)    Hypercholesterolemia 09/09/2017   Malignant neoplasm of rectum ([place]) 09/08/2017   Dr. Rollene Rotunda in [place]. Per notes she was due for colonoscopy in 12/2016   OSA on CPAP 09/08/2017   Other cardiomyopathies ([place])    Oxygen deficiency    2 liters at night   Pulmonary HTN ([place])    Sleep apnea    uses c-pap   Stroke [facility])    Type 2 diabetes,  controlled, with peripheral neuropathy ([place])    Urge incontinence of urine     Past Surgical History:  Procedure Laterality Date   BIOPSY  03/29/2018   Procedure: BIOPSY;  Surgeon: Yetta Flock, MD;  Location: [facility];  Service: Gastroenterology;;   COLONOSCOPY     COLONOSCOPY WITH PROPOFOL N/A 03/29/2018   Procedure: COLONOSCOPY WITH PROPOFOL;  Surgeon: Yetta Flock, MD;  Location: [facility];  Service: Gastroenterology;  Laterality: N/A;   ESOPHAGOGASTRODUODENOSCOPY (EGD) WITH PROPOFOL N/A 03/29/2018   Procedure: ESOPHAGOGASTRODUODENOSCOPY (EGD) WITH PROPOFOL;  Surgeon: Yetta Flock, MD;  Location: [facility];  Service: Gastroenterology;  Laterality: N/A;    POLYPECTOMY  03/29/2018   Procedure: POLYPECTOMY;  Surgeon: Yetta Flock, MD;  Location: [facility];  Service: Gastroenterology;;   RIGHT HEART CATH N/A 02/15/2020   Procedure: RIGHT HEART CATH;  Surgeon: Jolaine Artist, MD;  Location: [place] [facility];  Service: Cardiovascular;  Laterality: N/A;   RIGHT HEART CATH N/A 10/31/2020   Procedure: RIGHT HEART CATH;  Surgeon: Martinique, Peter M, MD;  Location: [place] [facility];  Service: Cardiovascular;  Laterality: N/A;   TONSILLECTOMY AND ADENOIDECTOMY     age 88   TUBAL LIGATION     years ago   UPPER GASTROINTESTINAL ENDOSCOPY     UPPER GI ENDOSCOPY  11/28/2009   mild erosive esophagitis, mild erosive changes in stomach, chronic constipation    Current Medications: Current Meds  Medication Sig   Alcohol Swabs (DROPSAFE ALCOHOL PREP) 70 % PADS TEST BLOOD SUGAR ONE TIME DAILY   amLODipine (NORVASC) 10 MG tablet Take 10 mg by mouth at bedtime.   aspirin 81 MG tablet Take 81 mg by mouth at bedtime.    atorvastatin (LIPITOR) 40 MG tablet TAKE 1 TABLET (40 MG TOTAL) BY MOUTH DAILY.   Blood Glucose Calibration (TRUE METRIX LEVEL 1) Low SOLN USE AS DIRECTED WITH GLUCOSE METER   Blood Glucose Monitoring Suppl (TRUE METRIX METER) DEVI Test daily  Pt will need a true metrix meter E11.9   carvedilol (COREG) 3.125 MG tablet TAKE 1 TABLET TWICE DAILY WITH MEALS (Patient taking differently: Take 3.125 mg by mouth 2 (two) times daily with a meal.)   ferrous sulfate 325 (65 FE) MG tablet Take 325 mg by mouth every Monday, Wednesday, and Friday.   ketoconazole (NIZORAL) 2 % cream Apply to both feet and between toes once daily for 6 weeks.   linaclotide (LINZESS) 290 MCG CAPS capsule Take 1 capsule by mouth daily 30 minutes before breakfast.   NONFORMULARY OR COMPOUNDED ITEM Antifungal solution: Terbinafine 3%, Fluconazole 2%, Tea Tree Oil 5%, Urea 10%, Ibuprofen 2% in DMSO suspension #5m   oxybutynin (DITROPAN-XL) 5 MG 24 hr tablet TAKE 1  TABLET(5 MG) BY MOUTH AT BEDTIME   OXYGEN Inhale 2 L into the lungs at bedtime. Uses during the day as needed   potassium chloride (KLOR-CON) 10 MEQ tablet Take 20 mEq by mouth daily as needed. 10 mg prn with turosemide   torsemide (DEMADEX) 20 MG tablet Take 3 tablets (60 mg total) by mouth daily as needed.   TRUE METRIX BLOOD GLUCOSE TEST test strip TEST BLOOD SUGAR ONE TIME DAILY   TRUEplus Lancets 33G MISC TEST BLOOD SUGAR ONE TIME DAILY   Vitamin D, Ergocalciferol, (DRISDOL) 1.25 MG (50000 UNIT) CAPS capsule Take 1 capsule (50,000 Units total) by mouth every 7 (seven) days.     Allergies:   Benazepril hcl, Lotrel [amlodipine besy-benazepril hcl], and Talwin [pentazocine]  Social History   Socioeconomic History   Marital status: Widowed    Spouse name: Not on file   Number of children: Not on file   Years of education: Not on file   Highest education level: Not on file  Occupational History   Not on file  Tobacco Use   Smoking status: Former    Types: Cigarettes    Quit date: 03/14/2009    Years since quitting: 13.4   Smokeless tobacco: Never   Tobacco comments:    quit smoking 2010  Vaping Use   Vaping Use: Never used  Substance and Sexual Activity   Alcohol use: No   Drug use: No   Sexual activity: Not on file  Other Topics Concern   Not on file  Social History Narrative   Lives alone.  2 daughters and 1 granddaughter in [place]   Social Determinants of Health   Financial Resource Strain: Low Risk  (09/08/2021)   Overall Financial Resource Strain (CARDIA)    Difficulty of Paying Living Expenses: Not hard at all  Food Insecurity: No Food Insecurity (03/11/2020)   Hunger Vital Sign    Worried About Running Out of Food in the Last Year: Never true    [place] in the Last Year: Never true  Transportation Needs: No Transportation Needs (12/25/2020)   PRAPARE - Hydrologist (Medical): No    Lack of Transportation (Non-Medical): No   Physical Activity: Not on file  Stress: No Stress Concern Present (04/08/2020)   [place]    Feeling of Stress : Not at all  Social Connections: Moderately Integrated (03/07/2020)   Social Connection and Isolation Panel [NHANES]    Frequency of Communication with Friends and Family: More than three times a week    Frequency of Social Gatherings with Friends and Family: More than three times a week    Attends Religious Services: More than 4 times per year    Active Member of [employer] or Organizations: Yes    Attends Archivist Meetings: 1 to 4 times per year    Marital Status: Widowed     Family History: The patient's family history is significant for a history of coronary artery disease at advanced ages.  And stroke at advanced ages ROS:   Please see the history of present illness.    All other systems are reviewed and are negative.   EKGs/Labs/Other Studies Reviewed:    The following studies were reviewed today: Comprehensive ICD check.  Echocardiogram 10/16/2020    1. Left ventricular ejection fraction, by estimation, is 55 to 60%. The  left ventricle has normal function. The left ventricle has no regional  wall motion abnormalities. Left ventricular diastolic parameters are  consistent with Grade I diastolic  dysfunction (impaired relaxation). There is the interventricular septum is  flattened in systole and diastole, consistent with right ventricular  pressure and volume overload.   2. Right ventricular systolic function is normal. The right ventricular  size is normal. There is severely elevated pulmonary artery systolic  pressure. The estimated right ventricular systolic pressure is 29.0 mmHg.   3. Left atrial size was moderately dilated.   4. Right atrial size was moderately dilated.   5. The mitral valve is normal in structure. Trivial mitral valve  regurgitation. No evidence of mitral  stenosis.   6. Tricuspid valve regurgitation is moderate.   7. The aortic valve is normal in  structure. Aortic valve regurgitation is  not visualized. No aortic stenosis is present.   8. The inferior vena cava is normal in size with greater than 50%  respiratory variability, suggesting right atrial pressure of 3 mmHg.   Comparison(s): Prior images reviewed side by side. The left ventricular  function has improved.   Right heart catheterization 10/31/2020 Fick Cardiac Output 4.52 L/min  Fick Cardiac Output Index 2.66 (L/min)/BSA  RA A Wave 3 mmHg  RA V Wave 4 mmHg  RA Mean 1 mmHg  RV Systolic Pressure 62 mmHg  RV Diastolic Pressure -1 mmHg  RV EDP 4 mmHg  PA Systolic Pressure 63 mmHg  PA Diastolic Pressure 14 mmHg  PA Mean 33 mmHg  PW A Wave 8 mmHg  PW V Wave 9 mmHg  PW Mean 6 mmHg  QP/QS 1  TPVR Index 12.43 HRUI    EKG:  EKG is not ordered today.  ECG from 04/16/2021 shows sinus rhythm with PACs, low voltage QRS complexes in the anterior leads  Recent Labs: 04/17/2022: ALT 8; BUN 40; Creatinine, Ser 1.81; Potassium 4.2; Sodium 146  Recent Lipid Panel    Component Value Date/Time   CHOL 164 04/17/2022 1439   TRIG 48 04/17/2022 1439   HDL 108 04/17/2022 1439   CHOLHDL 1.5 04/17/2022 1439   CHOLHDL 2.3 06/25/2007 0700   VLDL 11 06/25/2007 0700   LDLCALC 46 04/17/2022 1439    Physical Exam:    VS:  BP 128/62   Pulse 95   Ht 5' 1" (1.549 m)   Wt 171 lb 9.6 oz (77.8 kg)   BMI 32.42 kg/m     Wt Readings from Last 3 Encounters:  08/10/22 171 lb 9.6 oz (77.8 kg)  04/17/22 172 lb 12.8 oz (78.4 kg)  02/04/22 170 lb 6.4 oz (77.3 kg)     General: Alert, oriented x3, no distress, healthy left subclavian ICD site. Head: no evidence of trauma, PERRL, EOMI, no exophtalmos or lid lag, no myxedema, no xanthelasma; normal ears, nose and oropharynx Neck: normal jugular venous pulsations and no hepatojugular reflux; brisk carotid pulses without delay and no carotid  bruits Chest: clear to auscultation, no signs of consolidation by percussion or palpation, normal fremitus, symmetrical and full respiratory excursions Cardiovascular: normal position and quality of the apical impulse, regular rhythm, normal first and second heart sounds, no murmurs, rubs or gallops Abdomen: no tenderness or distention, no masses by palpation, no abnormal pulsatility or arterial bruits, normal bowel sounds, no hepatosplenomegaly Extremities: no clubbing, cyanosis or edema; 2+ radial, ulnar and brachial pulses bilaterally; 2+ right femoral, posterior tibial and dorsalis pedis pulses; 2+ left femoral, posterior tibial and dorsalis pedis pulses; no subclavian or femoral bruits Neurological: grossly nonfocal Psych: Normal mood and affect     ASSESSMENT:    1. Chronic combined systolic (congestive) and diastolic (congestive) heart failure ([place])   2. PAH (pulmonary artery hypertension) ([place])   3. ICD (implantable cardioverter-defibrillator) in place   4. Presence of manufacturer-recalled implantable cardioverter-defibrillator (ICD) lead, subsequent encounter   5. PAT (paroxysmal atrial tachycardia)   6. Essential hypertension   7. Hypercholesterolemia   8. Type 2 diabetes, controlled, with peripheral neuropathy (HCC)     PLAN:    In order of problems listed above:  CHF: Appears to be clinically euvolemic.  NYHA functional class II, very sedentary.  On the most recent echo from March 2022 the EF was reported as normal at 55 to 78%, although systolic PA pressure remains severely  elevated at over 70 mmHg.  She does not have significant LVH.  The mitral inflow shows only "impaired relaxation" pattern but there is a distinct L wave consistent with elevated mean left atrial pressure.  Nevertheless the E/e' ratio is not markedly elevated (medial 13, lateral 7).  Overall the impression is that she has at most mildly elevated left atrial pressures. PAH: This is moderate-severe and  largely independent of the left heart failure.  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  At her most recent cardiac catheterization in April 2022 the mean PA pressure was 33 mmHg in the setting of a mean PAWP of only 9 mmHg (transpulmonary gradient 26 mmHg).  It is likely related to longstanding undertreated sleep disordered breathing.  There is no evidence of significant fibrotic lung disease on high-resolution CT, although there is some evidence of restriction by pulmonary function tests.  She did not do well with PDE 5 inhibitors. ICD: Normal device function.  RV lead is under advisory, but has shown excellent parameters.  She has never received therapy for ventricular arrhythmia from her device and does not require ventricular pacing.  In about 6 months we will have to make a decision regarding replacement of her defibrillator generator.  Since she is now 82 years old and has never received therapy from the device and has recovered left ventricular systolic function, I would suggest "downgrading" the device to a pacemaker.  If she does receive a new defibrillator it would be worthwhile putting in a new ICD lead since her current Sprint Fidelis lead is under advisory, despite its normal parameters at this point.   Putting in a new lead would slightly increase the risk of complications at the time of generator change out.  If the subclavian vein is not patent and lead extraction is necessary, I would consider that to be a prohibitive risk procedure. considering her other comorbid conditions; and I would recommend against lead extraction.  The patient and her family will talk over these options and will make another appointment before anticipated generator ERI. PAT:  no clear events recently.  Did have some atrial "oversensing".  Sustained episode in May 2021 lasting for about an hour, well rate controlled and asymptomatic. No sustained events since then. On carvedilol.  True atrial fibrillation has never  been reported. HTN: Usually with excellent blood pressure control with systolic 032-122Q.  No changes made to medications today. CKD 4: Although there has been some fluctuation in the creatinine in the last couple of years, the true baseline appears to be around 1.6-1.8, corresponding to a GFR of 25-30.  In years past, creatinine was up to 2.5-3.4 range due to overdiuresis. DM: Diet controlled, not requiring any medications.  Recent hemoglobin A1c was 6.3 %. HLP: Has always had an excellent HDL.  LDL is well within guidelines at 46.  No change in statin. CAD: Asymptomatic.  Minor plaque at her last cardiac catheterization and minor carotid plaque by ultrasound.  On low-dose aspirin. OSA: recommend 100% compliance w CPAP. Abnormal alkaline phosphatase: Milder elevation compared to 2022.  Other liver function tests are all normal.  I think it's very likely that this was due to renal dysfunction rather than biliary abnormalities.  Patient Instructions  Medication Instructions:  No changes *If you need a refill on your cardiac medications before your next appointment, please call your pharmacy*  Follow-Up: At [facility], you and your health needs are our priority.  As part of our continuing mission to provide  you with exceptional heart care, we have created designated Provider Care Teams.  These Care Teams include your primary Cardiologist (physician) and Advanced Practice Providers (APPs -  Physician Assistants and Nurse Practitioners) who all work together to provide you with the care you need, when you need it.  We recommend signing up for the patient portal called "MyChart".  Sign up information is provided on this After Visit Summary.  MyChart is used to connect with patients for Virtual Visits (Telemedicine).  Patients are able to view lab/test results, encounter notes, upcoming appointments, etc.  Non-urgent messages can be sent to your provider as well.   To learn more about what you  can do with MyChart, go to [email or web address].    Your next appointment:   5 month(s)  Provider:   Sanda Klein, MD       Medication Adjustments/Labs and Tests Ordered: Patient Current medicines are reviewed at length with the patient today.  Concerns regarding medicines are outlined above.  Orders Placed This Encounter  Procedures   EKG 12-Lead   No orders of the defined types were placed in this encounter.   Signed, Sanda Klein, MD  08/13/2022 1:33 PM    [facility]'

## 2022-08-18 DIAGNOSIS — I10 Essential (primary) hypertension: Secondary | ICD-10-CM | POA: Diagnosis not present

## 2022-08-18 DIAGNOSIS — J449 Chronic obstructive pulmonary disease, unspecified: Secondary | ICD-10-CM | POA: Diagnosis not present

## 2022-08-18 DIAGNOSIS — I5089 Other heart failure: Secondary | ICD-10-CM | POA: Diagnosis not present

## 2022-08-18 DIAGNOSIS — G4733 Obstructive sleep apnea (adult) (pediatric): Secondary | ICD-10-CM | POA: Diagnosis not present

## 2022-08-19 ENCOUNTER — Ambulatory Visit (INDEPENDENT_AMBULATORY_CARE_PROVIDER_SITE_OTHER): Payer: Medicare PPO | Admitting: Podiatry

## 2022-08-19 ENCOUNTER — Encounter: Payer: Self-pay | Admitting: Podiatry

## 2022-08-19 VITALS — BP 168/76

## 2022-08-19 DIAGNOSIS — B351 Tinea unguium: Secondary | ICD-10-CM

## 2022-08-19 DIAGNOSIS — E1142 Type 2 diabetes mellitus with diabetic polyneuropathy: Secondary | ICD-10-CM | POA: Diagnosis not present

## 2022-08-19 DIAGNOSIS — M79674 Pain in right toe(s): Secondary | ICD-10-CM | POA: Diagnosis not present

## 2022-08-19 DIAGNOSIS — L84 Corns and callosities: Secondary | ICD-10-CM

## 2022-08-19 DIAGNOSIS — M79675 Pain in left toe(s): Secondary | ICD-10-CM

## 2022-08-19 NOTE — Progress Notes (Signed)
  Subjective:  Patient ID: [name], female    DOB: 04-25-1941,  MRN: [id number]  [name] presents to clinic today for at risk foot care with history of diabetic neuropathy and callus(es) both feet and painful thick toenails that are difficult to trim. Painful toenails interfere with ambulation. Aggravating factors include wearing enclosed shoe gear. Pain is relieved with periodic professional debridement. Painful calluses are aggravated when weightbearing with and without shoegear. Pain is relieved with periodic professional debridement.  New problem(s): None.   PCP is Tysinger, Camelia Eng, PA-C.  Allergies  Allergen Reactions   Benazepril Hcl Other (See Comments)    Unknown   Lotrel [Amlodipine Besy-Benazepril Hcl] Other (See Comments)    Hypotension    Talwin [Pentazocine] Other (See Comments)    Hallucinations      Review of Systems: Negative except as noted in the HPI.  Objective: Vitals:   08/19/22 0810  BP: (!) 168/76   [name] is a pleasant 82 y.o. female WD, WN in NAD. AAO x 3.  Vascular CFT <3 seconds b/l LE. Faintly palpable DP pulses b/l LE. Faintly palpable PT pulse(s) b/l LE. Pedal hair absent. No pain with calf compression b/l. Lower extremity skin temperature gradient within normal limits. No edema noted b/l LE. No ischemia or gangrene noted b/l LE. No cyanosis or clubbing noted b/l LE.  Neurologic Normal speech. Oriented to person, place, and time. Protective sensation decreased with 10 gram monofilament b/l. Vibratory sensation intact b/l.  Dermatologic Pedal skin is warm and supple b/l LE. No open wounds b/l LE. No interdigital macerations noted b/l LE.   Resolved scaling noted peripherally and plantarly b/l feet.  No interdigital macerations.  No blisters, no weeping. No signs of secondary bacterial infection noted.  Toenails 1-5 b/l elongated, discolored, dystrophic, thickened, crumbly with subungual debris and tenderness to dorsal palpation.    Hyperkeratotic lesion(s) submet heads 1, 5 lbilaterally. No erythema, no edema, no drainage, no fluctuance.  Orthopedic: Muscle strength 5/5 to all lower extremity muscle groups bilaterally. 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 Patient ambulates independent of any assistive aids.   Radiographs: None  Assessment/Plan: 1. Pain due to onychomycosis of toenails of both feet   2. Callus   3. Diabetic peripheral neuropathy associated with type 2 diabetes mellitus ([place])     -Patient was evaluated and treated. All patient's and/or POA's questions/concerns answered on today's visit. -Continue foot and shoe inspections daily. Monitor blood glucose per PCP/Endocrinologist's recommendations. -Continue diabetic shoes daily. -Mycotic toenails 1-5 bilaterally were debrided in length and girth with sterile nail nippers and dremel without incident. -Callus(es) submet head 1 b/l and submet head 5 b/l pared utilizing sterile scalpel blade without complication or incident. Total number debrided =4. -Patient/POA to call should there be question/concern in the interim.   Return in about 3 months (around 11/17/2022).  Marzetta Board, DPM

## 2022-09-03 DIAGNOSIS — E559 Vitamin D deficiency, unspecified: Secondary | ICD-10-CM | POA: Diagnosis not present

## 2022-09-03 DIAGNOSIS — I130 Hypertensive heart and chronic kidney disease with heart failure and stage 1 through stage 4 chronic kidney disease, or unspecified chronic kidney disease: Secondary | ICD-10-CM | POA: Diagnosis not present

## 2022-09-03 DIAGNOSIS — N184 Chronic kidney disease, stage 4 (severe): Secondary | ICD-10-CM | POA: Diagnosis not present

## 2022-09-03 DIAGNOSIS — N189 Chronic kidney disease, unspecified: Secondary | ICD-10-CM | POA: Diagnosis not present

## 2022-09-03 DIAGNOSIS — I509 Heart failure, unspecified: Secondary | ICD-10-CM | POA: Diagnosis not present

## 2022-09-03 DIAGNOSIS — I272 Pulmonary hypertension, unspecified: Secondary | ICD-10-CM | POA: Diagnosis not present

## 2022-09-03 DIAGNOSIS — R609 Edema, unspecified: Secondary | ICD-10-CM | POA: Diagnosis not present

## 2022-09-03 DIAGNOSIS — D631 Anemia in chronic kidney disease: Secondary | ICD-10-CM | POA: Diagnosis not present

## 2022-09-03 DIAGNOSIS — N2581 Secondary hyperparathyroidism of renal origin: Secondary | ICD-10-CM | POA: Diagnosis not present

## 2022-09-07 ENCOUNTER — Ambulatory Visit
Admission: RE | Admit: 2022-09-07 | Discharge: 2022-09-07 | Disposition: A | Discharge status: Home / Self Care | Payer: Medicare PPO | Source: Ambulatory Visit | Attending: Medical | Admitting: Medical

## 2022-09-07 DIAGNOSIS — Z1231 Encounter for screening mammogram for malignant neoplasm of breast: Secondary | ICD-10-CM | POA: Diagnosis not present

## 2022-09-07 DIAGNOSIS — J449 Chronic obstructive pulmonary disease, unspecified: Secondary | ICD-10-CM | POA: Diagnosis not present

## 2022-09-07 DIAGNOSIS — G4733 Obstructive sleep apnea (adult) (pediatric): Secondary | ICD-10-CM | POA: Diagnosis not present

## 2022-09-07 DIAGNOSIS — I10 Essential (primary) hypertension: Secondary | ICD-10-CM | POA: Diagnosis not present

## 2022-09-07 DIAGNOSIS — I5089 Other heart failure: Secondary | ICD-10-CM | POA: Diagnosis not present

## 2022-09-09 NOTE — Progress Notes (Signed)
Results sent through MyChart

## 2022-09-10 ENCOUNTER — Telehealth: Payer: Self-pay | Admitting: Internal Medicine

## 2022-09-10 DIAGNOSIS — E1142 Type 2 diabetes mellitus with diabetic polyneuropathy: Secondary | ICD-10-CM

## 2022-09-10 DIAGNOSIS — I10 Essential (primary) hypertension: Secondary | ICD-10-CM

## 2022-09-10 NOTE — Telephone Encounter (Signed)
done 

## 2022-09-10 NOTE — Telephone Encounter (Signed)
-----   Message from Maren Reamer, [facility] sent at 09/10/2022  3:01 PM EST ----- Regarding: 2300 - Referral Needing a 2300-Pharmacy referral placed for management of T2DM/HTN, at request of Camelia Eng. Tysinger, PA-C.  Thank you!  [place] Pharmacist [phone]

## 2022-09-17 DIAGNOSIS — I5089 Other heart failure: Secondary | ICD-10-CM | POA: Diagnosis not present

## 2022-09-17 DIAGNOSIS — G4733 Obstructive sleep apnea (adult) (pediatric): Secondary | ICD-10-CM | POA: Diagnosis not present

## 2022-09-17 DIAGNOSIS — I10 Essential (primary) hypertension: Secondary | ICD-10-CM | POA: Diagnosis not present

## 2022-09-17 DIAGNOSIS — J449 Chronic obstructive pulmonary disease, unspecified: Secondary | ICD-10-CM | POA: Diagnosis not present

## 2022-10-05 ENCOUNTER — Ambulatory Visit: Payer: Medicare PPO

## 2022-10-06 DIAGNOSIS — J449 Chronic obstructive pulmonary disease, unspecified: Secondary | ICD-10-CM | POA: Diagnosis not present

## 2022-10-06 DIAGNOSIS — I5089 Other heart failure: Secondary | ICD-10-CM | POA: Diagnosis not present

## 2022-10-06 DIAGNOSIS — I10 Essential (primary) hypertension: Secondary | ICD-10-CM | POA: Diagnosis not present

## 2022-10-06 DIAGNOSIS — G4733 Obstructive sleep apnea (adult) (pediatric): Secondary | ICD-10-CM | POA: Diagnosis not present

## 2022-10-07 LAB — CUP PACEART REMOTE DEVICE CHECK
Battery Remaining Longevity: 5 mo
Battery Voltage: 2.83 V
Brady Statistic AP VP Percent: 0.18 %
Brady Statistic AP VS Percent: 74.92 %
Brady Statistic AS VP Percent: 0.02 %
Brady Statistic AS VS Percent: 24.88 %
Brady Statistic RA Percent Paced: 73.59 %
Brady Statistic RV Percent Paced: 0.24 %
Date Time Interrogation Session: 20240318001703
HighPow Impedance: 39 Ohm
HighPow Impedance: 54 Ohm
Implantable Pulse Generator Implant Date: 20131120
Lead Channel Impedance Value: 304 Ohm
Lead Channel Impedance Value: 342 Ohm
Lead Channel Impedance Value: 380 Ohm
Lead Channel Pacing Threshold Amplitude: 0.75 V
Lead Channel Pacing Threshold Amplitude: 0.875 V
Lead Channel Pacing Threshold Pulse Width: 0.4 ms
Lead Channel Pacing Threshold Pulse Width: 0.4 ms
Lead Channel Sensing Intrinsic Amplitude: 0.375 mV
Lead Channel Sensing Intrinsic Amplitude: 0.375 mV
Lead Channel Sensing Intrinsic Amplitude: 3 mV
Lead Channel Sensing Intrinsic Amplitude: 3 mV
Lead Channel Setting Pacing Amplitude: 1.5 V
Lead Channel Setting Pacing Amplitude: 2 V
Lead Channel Setting Pacing Pulse Width: 0.4 ms
Lead Channel Setting Sensing Sensitivity: 0.3 mV
Zone Setting Status: 755011
Zone Setting Status: 755011

## 2022-10-14 ENCOUNTER — Ambulatory Visit: Payer: Medicare PPO | Admitting: Medical

## 2022-10-17 DIAGNOSIS — I5089 Other heart failure: Secondary | ICD-10-CM | POA: Diagnosis not present

## 2022-10-17 DIAGNOSIS — I10 Essential (primary) hypertension: Secondary | ICD-10-CM | POA: Diagnosis not present

## 2022-10-17 DIAGNOSIS — G4733 Obstructive sleep apnea (adult) (pediatric): Secondary | ICD-10-CM | POA: Diagnosis not present

## 2022-10-17 DIAGNOSIS — J449 Chronic obstructive pulmonary disease, unspecified: Secondary | ICD-10-CM | POA: Diagnosis not present

## 2022-10-26 DIAGNOSIS — J449 Chronic obstructive pulmonary disease, unspecified: Secondary | ICD-10-CM | POA: Diagnosis not present

## 2022-10-26 DIAGNOSIS — G4733 Obstructive sleep apnea (adult) (pediatric): Secondary | ICD-10-CM | POA: Diagnosis not present

## 2022-10-26 DIAGNOSIS — I10 Essential (primary) hypertension: Secondary | ICD-10-CM | POA: Diagnosis not present

## 2022-10-29 ENCOUNTER — Ambulatory Visit (INDEPENDENT_AMBULATORY_CARE_PROVIDER_SITE_OTHER): Payer: Medicare PPO | Admitting: Medical

## 2022-10-29 VITALS — BP 120/74 | HR 68 | Wt 165.0 lb

## 2022-10-29 DIAGNOSIS — E1142 Type 2 diabetes mellitus with diabetic polyneuropathy: Secondary | ICD-10-CM | POA: Diagnosis not present

## 2022-10-29 DIAGNOSIS — Z9981 Dependence on supplemental oxygen: Secondary | ICD-10-CM

## 2022-10-29 DIAGNOSIS — I10 Essential (primary) hypertension: Secondary | ICD-10-CM

## 2022-10-29 DIAGNOSIS — Z23 Encounter for immunization: Secondary | ICD-10-CM | POA: Diagnosis not present

## 2022-10-29 DIAGNOSIS — E782 Mixed hyperlipidemia: Secondary | ICD-10-CM

## 2022-10-29 DIAGNOSIS — E559 Vitamin D deficiency, unspecified: Secondary | ICD-10-CM

## 2022-10-29 DIAGNOSIS — D509 Iron deficiency anemia, unspecified: Secondary | ICD-10-CM

## 2022-10-29 DIAGNOSIS — Z95810 Presence of automatic (implantable) cardiac defibrillator: Secondary | ICD-10-CM

## 2022-10-29 DIAGNOSIS — Z7185 Encounter for immunization safety counseling: Secondary | ICD-10-CM

## 2022-10-29 DIAGNOSIS — G4733 Obstructive sleep apnea (adult) (pediatric): Secondary | ICD-10-CM | POA: Diagnosis not present

## 2022-10-29 DIAGNOSIS — I2510 Atherosclerotic heart disease of native coronary artery without angina pectoris: Secondary | ICD-10-CM

## 2022-10-29 DIAGNOSIS — N184 Chronic kidney disease, stage 4 (severe): Secondary | ICD-10-CM | POA: Diagnosis not present

## 2022-10-29 DIAGNOSIS — J449 Chronic obstructive pulmonary disease, unspecified: Secondary | ICD-10-CM | POA: Diagnosis not present

## 2022-10-29 LAB — POCT GLYCOSYLATED HEMOGLOBIN (HGB A1C): Hemoglobin A1C: 6 % — AB (ref 4.0–5.6)

## 2022-10-29 NOTE — Progress Notes (Signed)
Subjective:    [name] is a 82 y.o. female who presents for med check on chronic issues.  Here with her daughter [name].   Primary Care Provider Shanetta Nicolls, Kermit Baloavid S, PA-C here for primary care formerly  Current Health Care Team: Dentist, Dr. Myrle Shengstrickland Eye doctor, Dr. Dione BoozeGroat Dr. Rachelle HoraMihai Croitoru, Dr. Arvilla Meresaniel Bensimhon, cardiology Dr. Coralyn HellingVineet Sood, pulmonology Dr. Geralynn RileJennifer Galaway, podiatry Dr. Yevette EdwardsBindarhi, cardiology kidney Dr. Ileene PatrickSteven Armbruster, GI  Concerns: Diabetes-diet controlled.  She is checking her blood sugars.  Checks glucose about once weekly, usually around 100 fatsing.  No foot concerns.  OSA-uses CPAP nightly  Hypertension-compliant with amlodipine 10 mg daily, carvedilol 3.125 mg twice daily.  Uses Torsemide along with potassium prn, on average once weekly.  Hyperlipidemia-compliant with atorvastatin 40 mg daily aspirin 81 mg daily  Iron deficiency - Takes iron 300 mg MWF  Vitamin D deficiency-she takes vitamin D 2000's weekly, but nephology had her on weekly vit D for 7 weeks recently  Chronic constipation - takes Linzess daily, does well with this.  COPD - Uses oxygen at bedtime only, 2L at night, or sometimes uses with daytime nap.  No longer drives.  Can drive but never renewed license a while back  Lives alone but her daughter checks on her 3 days per week and talks to her everyday on the phone.  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. Per notes she was due for colonoscopy in 12/2016   OSA on CPAP 09/08/2017   Other cardiomyopathies (HCC)    Oxygen deficiency    2 liters at night   Pulmonary HTN (HCC)    Sleep apnea    uses c-pap   Stroke [facility](HCC)    Type 2 diabetes, controlled, with peripheral neuropathy (HCC)    Urge incontinence of urine     Past Surgical History:  Procedure Laterality Date   BIOPSY  03/29/2018   Procedure: BIOPSY;  Surgeon: Benancio DeedsArmbruster, Steven P, MD;  Location: [facility] ENDOS[facility];  Service: Gastroenterology;;   COLONOSCOPY     COLONOSCOPY WITH PROPOFOL N/A 03/29/2018   Procedure: COLONOSCOPY WITH PROPOFOL;  Surgeon: Benancio DeedsArmbruster, Steven P, MD;  Location: [facility];  Service: Gastroenterology;  Laterality: N/A;   ESOPHAGOGASTRODUODENOSCOPY (EGD) WITH PROPOFOL N/A 03/29/2018   Procedure: ESOPHAGOGASTRODUODENOSCOPY (EGD) WITH PROPOFOL;  Surgeon: Benancio DeedsArmbruster, Steven P, MD;  Location: [facility];  Service: Gastroenterology;  Laterality: N/A;   POLYPECTOMY  03/29/2018   Procedure: POLYPECTOMY;  Surgeon: Benancio DeedsArmbruster, Steven P, MD;  Location: [facility];  Service: Gastroenterology;;   RIGHT HEART CATH N/A 02/15/2020   Procedure: RIGHT HEART CATH;  Surgeon: Dolores PattyBensimhon, Daniel R, MD;  [facility];  Service: Cardiovascular;  Laterality: N/A;   RIGHT HEART CATH N/A 10/31/2020   Procedure: RIGHT HEART CATH;  Surgeon: SwazilandJordan, Peter M, MD;  [facility];  Service: Cardiovascular;  Laterality: N/A;   TONSILLECTOMY AND  ADENOIDECTOMY     age 60   TUBAL LIGATION     years ago   UPPER GASTROINTESTINAL ENDOSCOPY     UPPER GI ENDOSCOPY  11/28/2009   mild erosive esophagitis, mild erosive changes in stomach, chronic constipation       Current Outpatient Medications:    amLODipine (NORVASC) 10 MG tablet, Take 10 mg by mouth at bedtime., Disp: , Rfl:    aspirin 81 MG tablet, Take 81 mg by mouth at bedtime. , Disp: , Rfl:    atorvastatin (LIPITOR) 40 MG tablet, TAKE 1 TABLET (40  MG TOTAL) BY MOUTH DAILY., Disp: 90 tablet, Rfl: 2   Blood Glucose Calibration (TRUE METRIX LEVEL 1) Low SOLN, USE AS DIRECTED WITH GLUCOSE METER, Disp: 1 each, Rfl: 0   Blood Glucose Monitoring Suppl (TRUE METRIX METER) DEVI, Test daily  Pt will need a true metrix meter E11.9, Disp: 1 each, Rfl: 0   carvedilol (COREG) 3.125 MG tablet, TAKE 1 TABLET TWICE DAILY WITH MEALS (Patient taking differently: Take 3.125 mg by mouth 2 (two) times daily with a meal.), Disp: 180 tablet, Rfl: 3   ferrous sulfate 325 (65 FE) MG tablet, Take 325 mg by mouth every Monday, Wednesday, and Friday., Disp: , Rfl:    ketoconazole (NIZORAL) 2 % cream, Apply to both feet and between toes once daily for 6 weeks., Disp: 60 g, Rfl: 1   linaclotide (LINZESS) 290 MCG CAPS capsule, Take 1 capsule by mouth daily 30 minutes before breakfast., Disp: 30 capsule, Rfl: 2   oxybutynin (DITROPAN-XL) 5 MG 24 hr tablet, TAKE 1 TABLET(5 MG) BY MOUTH AT BEDTIME, Disp: 90 tablet, Rfl: 0   OXYGEN, Inhale 2 L into the lungs at bedtime. Uses during the day as needed, Disp: , Rfl:    potassium chloride (KLOR-CON) 10 MEQ tablet, Take 20 mEq by mouth daily as needed. 10 mg prn with turosemide, Disp: , Rfl:    torsemide (DEMADEX) 20 MG tablet, Take 3 tablets (60 mg total) by mouth daily as needed., Disp: 90 tablet, Rfl: 3   VITAMIN D, CHOLECALCIFEROL, PO, Take by mouth., Disp: , Rfl:    Alcohol Swabs (DROPSAFE ALCOHOL PREP) 70 % PADS, TEST BLOOD SUGAR ONE TIME DAILY, Disp: 100 each, Rfl: 1   NONFORMULARY OR COMPOUNDED ITEM, Antifungal solution: Terbinafine 3%, Fluconazole 2%, Tea Tree Oil 5%, Urea 10%, Ibuprofen 2% in DMSO suspension #3mL (Patient not taking: Reported on 10/29/2022), Disp: 1 each, Rfl: 3   TRUE METRIX BLOOD GLUCOSE TEST test strip, TEST BLOOD SUGAR ONE TIME DAILY, Disp: 100 strip, Rfl: 0   TRUEplus Lancets 33G MISC, TEST BLOOD SUGAR ONE TIME DAILY, Disp: 100 each, Rfl: 1   History reviewed: allergies, current medications, past  family history, past medical history, past social history, past surgical history and problem list    Objective:     BP 120/74   Pulse 68   Wt 165 lb (74.8 kg)   BMI 31.18 kg/m   BP Readings from Last 3 Encounters:  10/29/22 120/74  08/19/22 (!) 168/76  08/10/22 128/62   Wt Readings from Last 3 Encounters:  10/29/22 165 lb (74.8 kg)  08/10/22 171 lb 9.6 oz (77.8 kg)  04/17/22 172 lb 12.8 oz (78.4 kg)    General appearence: alert, no distress, WD/WN,  Neck: supple, no lymphadenopathy, no thyromegaly, no masses Heart: RRR, normal S1, S2, no murmurs Lungs: CTA bilaterally, no wheezes, rhonchi, or rales Abdomen: +bs, soft, non tender, non distended, no masses, no  hepatomegaly, no splenomegaly Pulses: 2+ symmetric, upper and lower extremities, normal cap refill Ext: no edema  Diabetic Foot Exam - Simple   Simple Foot Form Diabetic Foot exam was performed with the following findings: Yes 10/29/2022 12:16 PM  Visual Inspection See comments: Yes Sensation Testing Intact to touch and monofilament testing bilaterally: Yes Pulse Check See comments: Yes Comments 1+ pedal pulses, bunion bilat great toes        Assessment:   Encounter Diagnoses  Name Primary?   Type 2 diabetes, controlled, with peripheral neuropathy Yes   CKD (chronic kidney disease) stage 4, GFR 15-29 ml/min    On home O2    Mixed hyperlipidemia    Iron deficiency anemia, unspecified iron deficiency anemia type    ICD (implantable cardioverter-defibrillator) in place    OSA on CPAP    Chronic obstructive pulmonary disease, unspecified COPD type    Essential hypertension    Coronary artery disease involving native coronary artery of native heart without angina pectoris    Need for pneumococcal 20-valent conjugate vaccination    Vaccine counseling    Vitamin D deficiency       Plan:    Diabetes - currently diet controlled.  Labs today at goal  Cardiomyopathy and HTN, history of heart failure  - managed by cardiology  CAD, hyperlipidemia - continue statin, Aspirin  CKD - sees nephrology, reviewed 08/2022 notes and labs  OSA - compliant with CPAP  Iron deficiency - on iron daily, 08/2022 nephrology labs show stable CBC and iron  Vit D deficiency - low in 08/2022, completed 7 weeks of 50k weekly dosing, now back to 2k daily OTC dosing  Vaccines: We will have her sign for copy of last vaccines from [facility]  Counseled on the pneumococcal vaccine.  Vaccine information sheet given.  Pneumococcal vaccine Prevnar 20 given after consent obtained.    Sharin was seen today for diabetes.  Diagnoses and all orders for this visit:  Type 2 diabetes, controlled, with peripheral neuropathy -     HgB A1c  CKD (chronic kidney disease) stage 4, GFR 15-29 ml/min  On home O2  Mixed hyperlipidemia  Iron deficiency anemia, unspecified iron deficiency anemia type  ICD (implantable cardioverter-defibrillator) in place  OSA on CPAP  Chronic obstructive pulmonary disease, unspecified COPD type  Essential hypertension  Coronary artery disease involving native coronary artery of native heart without angina pectoris  Need for pneumococcal 20-valent conjugate vaccination -     Pneumococcal conjugate vaccine 20-valent  Vaccine counseling  Vitamin D deficiency    F/u 4mo

## 2022-11-06 DIAGNOSIS — G4733 Obstructive sleep apnea (adult) (pediatric): Secondary | ICD-10-CM | POA: Diagnosis not present

## 2022-11-06 DIAGNOSIS — I5089 Other heart failure: Secondary | ICD-10-CM | POA: Diagnosis not present

## 2022-11-06 DIAGNOSIS — I10 Essential (primary) hypertension: Secondary | ICD-10-CM | POA: Diagnosis not present

## 2022-11-06 DIAGNOSIS — J449 Chronic obstructive pulmonary disease, unspecified: Secondary | ICD-10-CM | POA: Diagnosis not present

## 2022-11-09 ENCOUNTER — Ambulatory Visit (INDEPENDENT_AMBULATORY_CARE_PROVIDER_SITE_OTHER): Payer: Medicare PPO

## 2022-11-09 DIAGNOSIS — I5042 Chronic combined systolic (congestive) and diastolic (congestive) heart failure: Secondary | ICD-10-CM

## 2022-11-10 LAB — CUP PACEART REMOTE DEVICE CHECK
Battery Remaining Longevity: 3 mo
Battery Voltage: 2.79 V
Brady Statistic AP VP Percent: 0.78 %
Brady Statistic AP VS Percent: 78.38 %
Brady Statistic AS VP Percent: 0.06 %
Brady Statistic AS VS Percent: 20.77 %
Brady Statistic RA Percent Paced: 77.28 %
Brady Statistic RV Percent Paced: 0.94 %
Date Time Interrogation Session: 20240422022703
HighPow Impedance: 41 Ohm
HighPow Impedance: 59 Ohm
Implantable Pulse Generator Implant Date: 20131120
Lead Channel Impedance Value: 304 Ohm
Lead Channel Impedance Value: 342 Ohm
Lead Channel Impedance Value: 399 Ohm
Lead Channel Pacing Threshold Amplitude: 0.875 V
Lead Channel Pacing Threshold Amplitude: 0.875 V
Lead Channel Pacing Threshold Pulse Width: 0.4 ms
Lead Channel Pacing Threshold Pulse Width: 0.4 ms
Lead Channel Sensing Intrinsic Amplitude: 0.375 mV
Lead Channel Sensing Intrinsic Amplitude: 0.375 mV
Lead Channel Sensing Intrinsic Amplitude: 3.25 mV
Lead Channel Sensing Intrinsic Amplitude: 3.25 mV
Lead Channel Setting Pacing Amplitude: 1.75 V
Lead Channel Setting Pacing Amplitude: 2 V
Lead Channel Setting Pacing Pulse Width: 0.4 ms
Lead Channel Setting Sensing Sensitivity: 0.3 mV
Zone Setting Status: 755011
Zone Setting Status: 755011

## 2022-11-17 DIAGNOSIS — I10 Essential (primary) hypertension: Secondary | ICD-10-CM | POA: Diagnosis not present

## 2022-11-17 DIAGNOSIS — G4733 Obstructive sleep apnea (adult) (pediatric): Secondary | ICD-10-CM | POA: Diagnosis not present

## 2022-11-17 DIAGNOSIS — I5089 Other heart failure: Secondary | ICD-10-CM | POA: Diagnosis not present

## 2022-11-17 DIAGNOSIS — J449 Chronic obstructive pulmonary disease, unspecified: Secondary | ICD-10-CM | POA: Diagnosis not present

## 2022-11-23 ENCOUNTER — Other Ambulatory Visit: Payer: Self-pay | Admitting: Medical

## 2022-12-02 ENCOUNTER — Ambulatory Visit: Payer: Medicare PPO | Admitting: Podiatry

## 2022-12-02 ENCOUNTER — Encounter: Payer: Self-pay | Admitting: Podiatry

## 2022-12-02 DIAGNOSIS — L84 Corns and callosities: Secondary | ICD-10-CM

## 2022-12-02 DIAGNOSIS — M79674 Pain in right toe(s): Secondary | ICD-10-CM

## 2022-12-02 DIAGNOSIS — M79675 Pain in left toe(s): Secondary | ICD-10-CM | POA: Diagnosis not present

## 2022-12-02 DIAGNOSIS — E1142 Type 2 diabetes mellitus with diabetic polyneuropathy: Secondary | ICD-10-CM | POA: Diagnosis not present

## 2022-12-02 DIAGNOSIS — B351 Tinea unguium: Secondary | ICD-10-CM | POA: Diagnosis not present

## 2022-12-05 NOTE — Progress Notes (Signed)
  Subjective:  Patient ID: [name], female    DOB: [birth year],  MRN: [id number]  [name] presents to clinic today for at risk foot care with history of diabetic neuropathy and callus(es) both feet and painful thick toenails that are difficult to trim. Painful toenails interfere with ambulation. Aggravating factors include wearing enclosed shoe gear. Pain is relieved with periodic professional debridement. Painful calluses are aggravated when weightbearing with and without shoegear. Pain is relieved with periodic professional debridement.  Chief Complaint  Patient presents with   DIABETIC FOOT CARE     Last seen PCP in 10/2022 A1C 6 BS 100   New problem(s): None.   PCP is Tysinger, Kermit Balo, PA-C.  Allergies  Allergen Reactions   Benazepril Hcl Other (See Comments)    Unknown   Lotrel [Amlodipine Besy-Benazepril Hcl] Other (See Comments)    Hypotension    Talwin [Pentazocine] Other (See Comments)    Hallucinations      Review of Systems: Negative except as noted in the HPI.  Objective: No changes noted in today's physical examination. There were no vitals filed for this visit. [name] is a pleasant 82 y.o. female WD, WN in NAD. AAO x 3.  Vascular CFT <3 seconds b/l LE. Faintly palpable DP pulses b/l LE. Faintly palpable PT pulse(s) b/l LE. Pedal hair absent. No pain with calf compression b/l. Lower extremity skin temperature gradient within normal limits. No edema noted b/l LE. No ischemia or gangrene noted b/l LE. No cyanosis or clubbing noted b/l LE.  Neurologic Normal speech. Oriented to person, place, and time. Protective sensation decreased with 10 gram monofilament b/l. Vibratory sensation intact b/l.  Dermatologic Pedal skin is warm and supple b/l LE. No open wounds b/l LE. No interdigital macerations noted b/l LE.   Toenails 1-5 b/l elongated, discolored, dystrophic, thickened, crumbly with subungual debris and tenderness to dorsal palpation.    Hyperkeratotic lesion(s) submet heads 1, 5 lbilaterally. No erythema, no edema, no drainage, no fluctuance.  Orthopedic: Muscle strength 5/5 to all lower extremity muscle groups bilaterally. 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 Patient ambulates independent of any assistive aids.   Radiographs: None  Assessment/Plan: 1. Pain due to onychomycosis of toenails of both feet   2. Callus   3. Diabetic peripheral neuropathy associated with type 2 diabetes mellitus (HCC)    -Patient's family member present. All questions/concerns addressed on today's visit. -Consent given for treatment as described below: -Examined patient. -Patient to continue soft, supportive shoe gear daily. -Toenails 1-5 b/l were debrided in length and girth with sterile nail nippers and dremel without iatrogenic bleeding.  -Callus(es) submet head 1 b/l and submet head 5 b/l pared utilizing sharp debridement with sterile blade without complication or incident. Total number debrided =4. -Patient/POA to call should there be question/concern in the interim.   Return in about 3 months (around 03/04/2023).  Freddie Breech, DPM

## 2022-12-06 DIAGNOSIS — J449 Chronic obstructive pulmonary disease, unspecified: Secondary | ICD-10-CM | POA: Diagnosis not present

## 2022-12-06 DIAGNOSIS — I5089 Other heart failure: Secondary | ICD-10-CM | POA: Diagnosis not present

## 2022-12-06 DIAGNOSIS — I10 Essential (primary) hypertension: Secondary | ICD-10-CM | POA: Diagnosis not present

## 2022-12-06 DIAGNOSIS — G4733 Obstructive sleep apnea (adult) (pediatric): Secondary | ICD-10-CM | POA: Diagnosis not present

## 2022-12-09 ENCOUNTER — Other Ambulatory Visit: Payer: Self-pay

## 2022-12-09 MED ORDER — AMLODIPINE BESYLATE 10 MG PO TABS: 10.0000 mg | ORAL_TABLET | Freq: Every evening | ORAL | 2 refills | Status: DC

## 2022-12-10 ENCOUNTER — Ambulatory Visit (INDEPENDENT_AMBULATORY_CARE_PROVIDER_SITE_OTHER): Payer: Medicare PPO

## 2022-12-10 DIAGNOSIS — I5042 Chronic combined systolic (congestive) and diastolic (congestive) heart failure: Secondary | ICD-10-CM

## 2022-12-10 LAB — CUP PACEART REMOTE DEVICE CHECK
Battery Remaining Longevity: 2 mo
Battery Voltage: 2.81 V
Brady Statistic AP VP Percent: 0.19 %
Brady Statistic AP VS Percent: 74.44 %
Brady Statistic AS VP Percent: 0.02 %
Brady Statistic AS VS Percent: 25.35 %
Brady Statistic RA Percent Paced: 73.31 %
Brady Statistic RV Percent Paced: 0.26 %
Date Time Interrogation Session: 20240523012504
HighPow Impedance: 40 Ohm
HighPow Impedance: 57 Ohm
Implantable Pulse Generator Implant Date: 20131120
Lead Channel Impedance Value: 304 Ohm
Lead Channel Impedance Value: 342 Ohm
Lead Channel Impedance Value: 399 Ohm
Lead Channel Pacing Threshold Amplitude: 0.75 V
Lead Channel Pacing Threshold Amplitude: 0.875 V
Lead Channel Pacing Threshold Pulse Width: 0.4 ms
Lead Channel Pacing Threshold Pulse Width: 0.4 ms
Lead Channel Sensing Intrinsic Amplitude: 0.5 mV
Lead Channel Sensing Intrinsic Amplitude: 0.5 mV
Lead Channel Sensing Intrinsic Amplitude: 4.125 mV
Lead Channel Sensing Intrinsic Amplitude: 4.125 mV
Lead Channel Setting Pacing Amplitude: 1.75 V
Lead Channel Setting Pacing Amplitude: 2 V
Lead Channel Setting Pacing Pulse Width: 0.4 ms
Lead Channel Setting Sensing Sensitivity: 0.3 mV
Zone Setting Status: 755011
Zone Setting Status: 755011

## 2022-12-15 NOTE — Addendum Note (Signed)
Addended by: Geralyn Flash D on: 12/15/2022 05:03 PM   Modules accepted: Level of Service

## 2022-12-15 NOTE — Progress Notes (Signed)
Remote ICD transmission.   

## 2022-12-17 DIAGNOSIS — G4733 Obstructive sleep apnea (adult) (pediatric): Secondary | ICD-10-CM | POA: Diagnosis not present

## 2022-12-17 DIAGNOSIS — J449 Chronic obstructive pulmonary disease, unspecified: Secondary | ICD-10-CM | POA: Diagnosis not present

## 2022-12-17 DIAGNOSIS — I5089 Other heart failure: Secondary | ICD-10-CM | POA: Diagnosis not present

## 2022-12-17 DIAGNOSIS — I10 Essential (primary) hypertension: Secondary | ICD-10-CM | POA: Diagnosis not present

## 2022-12-30 NOTE — Progress Notes (Signed)
Remote ICD transmission.   

## 2023-01-04 ENCOUNTER — Ambulatory Visit (INDEPENDENT_AMBULATORY_CARE_PROVIDER_SITE_OTHER): Payer: Medicare PPO | Admitting: Cardiovascular Disease

## 2023-01-04 ENCOUNTER — Ambulatory Visit
Admission: RE | Admit: 2023-01-04 | Discharge: 2023-01-04 | Disposition: A | Discharge status: Home / Self Care | Payer: Medicare PPO | Source: Ambulatory Visit | Attending: Cardiovascular Disease | Admitting: Cardiovascular Disease

## 2023-01-04 ENCOUNTER — Encounter: Payer: Self-pay | Admitting: Cardiovascular Disease

## 2023-01-04 VITALS — BP 113/65 | HR 66 | Ht 61.0 in | Wt 174.4 lb

## 2023-01-04 DIAGNOSIS — I5042 Chronic combined systolic (congestive) and diastolic (congestive) heart failure: Secondary | ICD-10-CM

## 2023-01-04 DIAGNOSIS — E7800 Pure hypercholesterolemia, unspecified: Secondary | ICD-10-CM | POA: Diagnosis not present

## 2023-01-04 DIAGNOSIS — I2721 Secondary pulmonary arterial hypertension: Secondary | ICD-10-CM

## 2023-01-04 DIAGNOSIS — G4733 Obstructive sleep apnea (adult) (pediatric): Secondary | ICD-10-CM

## 2023-01-04 DIAGNOSIS — E1142 Type 2 diabetes mellitus with diabetic polyneuropathy: Secondary | ICD-10-CM

## 2023-01-04 DIAGNOSIS — S0990XA Unspecified injury of head, initial encounter: Secondary | ICD-10-CM

## 2023-01-04 DIAGNOSIS — N184 Chronic kidney disease, stage 4 (severe): Secondary | ICD-10-CM

## 2023-01-04 DIAGNOSIS — I10 Essential (primary) hypertension: Secondary | ICD-10-CM

## 2023-01-04 DIAGNOSIS — I2510 Atherosclerotic heart disease of native coronary artery without angina pectoris: Secondary | ICD-10-CM

## 2023-01-04 DIAGNOSIS — I6381 Other cerebral infarction due to occlusion or stenosis of small artery: Secondary | ICD-10-CM | POA: Diagnosis not present

## 2023-01-04 DIAGNOSIS — R748 Abnormal levels of other serum enzymes: Secondary | ICD-10-CM

## 2023-01-04 DIAGNOSIS — Z95810 Presence of automatic (implantable) cardiac defibrillator: Secondary | ICD-10-CM | POA: Insufficient documentation

## 2023-01-04 DIAGNOSIS — I4719 Other supraventricular tachycardia: Secondary | ICD-10-CM | POA: Diagnosis not present

## 2023-01-04 DIAGNOSIS — Z8673 Personal history of transient ischemic attack (TIA), and cerebral infarction without residual deficits: Secondary | ICD-10-CM

## 2023-01-04 NOTE — Patient Instructions (Signed)
Medication Instructions:  No changes *If you need a refill on your cardiac medications before your next appointment, please call your pharmacy*   Follow-Up: At [facility], you and your health needs are our priority.  As part of our continuing mission to provide you with exceptional heart care, we have created designated Provider Care Teams.  These Care Teams include your primary Cardiologist (physician) and Advanced Practice Providers (APPs -  Physician Assistants and Nurse Practitioners) who all work together to provide you with the care you need, when you need it.  We recommend signing up for the patient portal called "MyChart".  Sign up information is provided on this After Visit Summary.  MyChart is used to connect with patients for Virtual Visits (Telemedicine).  Patients are able to view lab/test results, encounter notes, upcoming appointments, etc.  Non-urgent messages can be sent to your provider as well.   To learn more about what you can do with MyChart, go to [email or web address].    Your next appointment:   6 month(s)  Provider:   Mihai Croitoru, MD     

## 2023-01-06 DIAGNOSIS — I10 Essential (primary) hypertension: Secondary | ICD-10-CM | POA: Diagnosis not present

## 2023-01-06 DIAGNOSIS — G4733 Obstructive sleep apnea (adult) (pediatric): Secondary | ICD-10-CM | POA: Diagnosis not present

## 2023-01-06 DIAGNOSIS — I5089 Other heart failure: Secondary | ICD-10-CM | POA: Diagnosis not present

## 2023-01-06 DIAGNOSIS — J449 Chronic obstructive pulmonary disease, unspecified: Secondary | ICD-10-CM | POA: Diagnosis not present

## 2023-01-10 ENCOUNTER — Encounter: Payer: Self-pay | Admitting: Cardiovascular Disease

## 2023-01-10 NOTE — Progress Notes (Signed)
Cardiology Office Note:    Date:  01/10/2023   ID:  [name], DOB [birth year], MRN [id number]  PCP:  Jac Canavan, PA-C  Cardiologist:  Thurmon Fair, MD   Referring MD: Jac Canavan, PA-C   Chief complaint: CHF, ICD  History of Present Illness:    [name] is a 82 y.o. female with a hx of nonischemic cardiomyopathy (previous EF 35%, most recent EF 45-50%), minor nonobstructive coronary artery disease (last left heart cath July 2021), status post defibrillator (Medtronic [place] dual-chamber, implanted 2013, Sprint [place] [id number] under advisory), severe pulmonary hypertension, obstructive sleep apnea, restrictive lung disease, essential hypertension, hypercholesterolemia type 2 diabetes mellitus, which is currently diet controlled.  Unfortunately just as [name] was entering the exam room she fell and hit the floor of the room with her forehead and nose.  She remained fully alert and oriented throughout.  She did not feel dizzy or have syncope.  According to the CMA who was with her in the room she simply tripped.  There was a little bleeding from a small laceration across the bridge of her nose and she developed a moderate-sized hematoma in the mid frontal area between her eyebrows.  She denied any visual changes,  headache or any focal neurological complaints.  Sent her for a head CT right after this visit and there was no evidence of intracranial hemorrhage or skull fracture.  Remains quite sedentary but for her level of activity denies any restrictions.  Does not have shortness of breath or chest pain.  Has not had palpitations, dizziness or syncope.  Has not had lower extremity edema, major weight changes, orthopnea, PND.  On her home scale her weight remains steady around 165-170 pounds.  It has been about 3 years since we have had to adjust her diuretic dose or admitted to the hospital for heart failure exacerbation.  Her ICD is anticipated to reach ERI in  about 2 months.  She has a dual-chamber device and has 75% atrial pacing and only 0.4% ventricular pacing.  Her device has not recorded any high ventricular rates but she did have a 6-minute episode of atrial tachycardia or possibly slow flutter (atrial rate around 200 bpm, ventricular rate 100 bpm).  This occurred on 11/27/2022 and has not been seen since.  Name checked in February was a little higher than last year (from 1.8 increased to 2.07).  She is not anemic.  Hemoglobin is 12.1.  Liver function tests are normal.  Hemoglobin A1c is borderline at 6.0%.  All her lipid parameters are excellent with an HDL of 108 and LDL of 46.  Her nephrologist is Dr. Ronalee Belts and her gastroenterologist is Dr. Adela Lank.    Right heart cath in 2021 showed severe pulmonary artery hypertension with normal left-sided pressures and preserved cardiac output.  Treatment with sildenafil and amlodipine was stopped due to hypotension.  A follow-up right heart catheterization in April 2022 showed a mean PA pressure of 33 mmHg, normal left ventricular and right ventricular filling pressures and normal cardiac index of 2.66.   She was admitted for congestive heart failure with gradual onset of hypervolemia in July 2021 (possibly related to inability to comply with CPAP due to defective equipment). She had marked hypoxemia, requiring 5-6 L/minute oxygen for quite a while despite aggressive diuresis. She underwent right heart catheterization with the following findings.  Findings:   RA = 20 RV = 72/27 PA = 79/44 (53) PCW = 18 Fick cardiac output/index =  5.35/2.9 Thermo CO/CI = 4.4/2.4 PVR = 6.5 (Fick) 8.0 (Thermo) Ao sat = 94% PA sat = 64%, 64% SVC sat = 61%   Assessment: 1. Severe PAH with normal left-sided pressures   Plan/Discussion:   There is near equalization or diastolic pressures which can reflect restrictive physiology or elevated R-sided pressure with normal left pressures. Given relatively normal PCWP  - restriction felt to be less likely.   Treatment of PDE 5 inhibitors was initiated and seems to be well-tolerated and allowed additional diuresis. She was discharged to a rehab facility where she quickly reaccumulated fluid and rehospitalized. She responded to repeat treatment with diuretics. It was suspected that the sildenafil may have contributed to worsening left heart failure and this medication was then discontinued.  High resolution chest CT repeated in July 2021 did not show evidence of fibrotic lung disease. Nevertheless, the PFTs are currently showing restrictive lung disease with FVC around 65% of predicted and, certain reduction in FEV1.  Her last cardiac catheterization was in December 2015 and described "mild plaquing and no obstructive CAD" in the left main and circumflex coronary artery, while the LAD and RCA were described as normal.  At that time the EF was 35-45% by ventriculography in the left ventricular end-diastolic pressure was 30 mmHg.  The mean pulmonary artery pressure was 25 mmHg.  The pulmonary artery pressure was 90/23 mmHg (with systemic blood pressure 168/66).  Carotid duplex ultrasound performed in October 2017 showed minor plaque in both carotids and antegrade flow in both vertebral arteries.  Her device shows occasional relatively brief episodes of atrial mode switch, most commonly paroxysmal atrial tachycardia or slow atrial flutter with varying degrees of AV block. High ventricular rates are not seen. The longest episode lasted for about an hour and occurred in May 2021. Lead parameters are normal.  As mentioned her ventricular lead is a Sprint Fidelis [id number] under advisory but shows no evidence of SIC.  Her cardiologist in [place] for many years was Dr. Cleda Clarks, most recently Dr. Joen Laura  She bears a diagnosis of restrictive lung disease and obstructive sleep apnea.  She is supposed to be on home oxygen.  She uses CPAP for severe obstructive sleep apnea  with an apnea hypotony index of 59 and desaturations down to 80%.  Despite treatment with CPAP it has been very difficult to control her AHI.  She plans to follow-up with Dr. Craige Cotta or with Dr. Vassie Loll in the pulmonary clinic.  The report stated that prior PFTs showed no obstruction but fairly significant restriction and reduced DLCO but repeat PFTs today were improved, CT chest was unremarkable and the feeling was that her initial PFTs may have not been accurate.  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  She had a pulmonary embolism 40 years ago.  EGD performed in 2011 showed mild erosive esophagitis and gastritis.  She was prescribed Protonix.  She has a remote history of depression for which she is not currently receiving any medications.  She has never smoked and denies use of alcohol.     Past Medical History:  Diagnosis Date   Anemia    CAD (coronary artery disease)    Cardiac defibrillator in situ    Cataract    bilateral 2017   CHF (congestive heart failure) (HCC)    Chronic systolic heart failure (HCC)    CKD (chronic kidney disease) stage 4, GFR 15-29 ml/min (HCC)    COPD (chronic obstructive pulmonary disease) (HCC)  Diabetes mellitus type 2, diet-controlled (HCC)    Dyspnea    HTN (hypertension)    Hypercholesterolemia 09/09/2017   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 Suzie Portela in [place]. Per notes she was due for colonoscopy in 12/2016   OSA on CPAP 09/08/2017   Other cardiomyopathies (HCC)    Oxygen deficiency    2 liters at night   Pulmonary HTN (HCC)    Sleep apnea    uses c-pap   Stroke [facility])    Type 2 diabetes, controlled, with peripheral neuropathy (HCC)    Urge incontinence of urine     Past Surgical History:  Procedure Laterality Date   BIOPSY  03/29/2018   Procedure: BIOPSY;  Surgeon: Benancio Deeds, MD;  Location: [facility];  Service: Gastroenterology;;   COLONOSCOPY     COLONOSCOPY WITH PROPOFOL N/A  03/29/2018   Procedure: COLONOSCOPY WITH PROPOFOL;  Surgeon: Benancio Deeds, MD;  Location: [facility];  Service: Gastroenterology;  Laterality: N/A;   ESOPHAGOGASTRODUODENOSCOPY (EGD) WITH PROPOFOL N/A 03/29/2018   Procedure: ESOPHAGOGASTRODUODENOSCOPY (EGD) WITH PROPOFOL;  Surgeon: Benancio Deeds, MD;  Location: [facility];  Service: Gastroenterology;  Laterality: N/A;   POLYPECTOMY  03/29/2018   Procedure: POLYPECTOMY;  Surgeon: Benancio Deeds, MD;  Location: [facility];  Service: Gastroenterology;;   RIGHT HEART CATH N/A 02/15/2020   Procedure: RIGHT HEART CATH;  Surgeon: Dolores Patty, MD;  Location: [facility] INVASIVE CV LAB;  Service: Cardiovascular;  Laterality: N/A;   RIGHT HEART CATH N/A 10/31/2020   Procedure: RIGHT HEART CATH;  Surgeon: [place]az[place], Peter M, MD;  Location: [facility] INVASIVE CV LAB;  Service: Cardiovascular;  Laterality: N/A;   TONSILLECTOMY AND ADENOIDECTOMY     age 49   TUBAL LIGATION     years ago   UPPER GASTROINTESTINAL ENDOSCOPY     UPPER GI ENDOSCOPY  11/28/2009   mild erosive esophagitis, mild erosive changes in stomach, chronic constipation    Current Medications: No outpatient medications have been marked as taking for the 01/04/23 encounter (Office Visit) with Thurmon Fair, MD.     Allergies:   Benazepril hcl, Lotrel [amlodipine besy-benazepril hcl], and Talwin [pentazocine]   Social History   Socioeconomic History   Marital status: Widowed    Spouse name: Not on file   Number of children: Not on file   Years of education: Not on file   Highest education level: Not on file  Occupational History   Not on file  Tobacco Use   Smoking status: Former    Types: Cigarettes    Quit date: 03/14/2009    Years since quitting: 13.8   Smokeless tobacco: Never   Tobacco comments:    quit smoking 2010  Vaping Use   Vaping Use: Never used  Substance and Sexual Activity   Alcohol use: No   Drug use: No   Sexual activity: Not on file   Other Topics Concern   Not on file  Social History Narrative   Lives alone.  2 daughters and 1 granddaughter in [place]   Social Determinants of Health   Financial Resource Strain: Low Risk  (09/08/2021)   Overall Financial Resource Strain (CARDIA)    Difficulty of Paying Living Expenses: Not hard at all  Food Insecurity: No Food Insecurity (03/11/2020)   Hunger Vital Sign    Worried About Running Out of Food in the Last Year: Never true    Ran Out of Food in the Last Year: Never true  Transportation Needs:  No Transportation Needs (12/25/2020)   PRAPARE - Administrator, Civil Service (Medical): No    Lack of Transportation (Non-Medical): No  Physical Activity: Not on file  Stress: No Stress Concern Present (04/08/2020)   [employer] of Occupational Health - Occupational Stress Questionnaire    Feeling of Stress : Not at all  Social Connections: Moderately Integrated (03/07/2020)   Social Connection and Isolation Panel [NHANES]    Frequency of Communication with Friends and Family: More than three times a week    Frequency of Social Gatherings with Friends and Family: More than three times a week    Attends Religious Services: More than 4 times per year    Active Member of [employer] or Organizations: Yes    Attends Banker Meetings: 1 to 4 times per year    Marital Status: Widowed     Family History: The patient's family history is significant for a history of coronary artery disease at advanced ages.  And stroke at advanced ages ROS:   Please see the history of present illness.    All other systems are reviewed and are negative.   EKGs/Labs/Other Studies Reviewed:    The following studies were reviewed today: Comprehensive ICD check.  Echocardiogram 10/16/2020    1. Left ventricular ejection fraction, by estimation, is 55 to 60%. The  left ventricle has normal function. The left ventricle has no regional  wall motion abnormalities. Left ventricular  diastolic parameters are  consistent with Grade I diastolic  dysfunction (impaired relaxation). There is the interventricular septum is  flattened in systole and diastole, consistent with right ventricular  pressure and volume overload.   2. Right ventricular systolic function is normal. The right ventricular  size is normal. There is severely elevated pulmonary artery systolic  pressure. The estimated right ventricular systolic pressure is 72.6 mmHg.   3. Left atrial size was moderately dilated.   4. Right atrial size was moderately dilated.   5. The mitral valve is normal in structure. Trivial mitral valve  regurgitation. No evidence of mitral stenosis.   6. Tricuspid valve regurgitation is moderate.   7. The aortic valve is normal in structure. Aortic valve regurgitation is  not visualized. No aortic stenosis is present.   8. The inferior vena cava is normal in size with greater than 50%  respiratory variability, suggesting right atrial pressure of 3 mmHg.   Comparison(s): Prior images reviewed side by side. The left ventricular  function has improved.   Right heart catheterization 10/31/2020 Fick Cardiac Output 4.52 L/min  Fick Cardiac Output Index 2.66 (L/min)/BSA  RA A Wave 3 mmHg  RA V Wave 4 mmHg  RA Mean 1 mmHg  RV Systolic Pressure 62 mmHg  RV Diastolic Pressure -1 mmHg  RV EDP 4 mmHg  PA Systolic Pressure 63 mmHg  PA Diastolic Pressure 14 mmHg  PA Mean 33 mmHg  PW A Wave 8 mmHg  PW V Wave 9 mmHg  PW Mean 6 mmHg  QP/QS 1  TPVR Index 12.43 HRUI    EKG:  EKG is not ordered today.  ECG from 04/16/2021 shows sinus rhythm with PACs, low voltage QRS complexes in the anterior leads  Recent Labs: 04/17/2022: ALT 8; BUN 40; Creatinine, Ser 1.81; Potassium 4.2; Sodium 146  Recent Lipid Panel    Component Value Date/Time   CHOL 164 04/17/2022 1439   TRIG 48 04/17/2022 1439   HDL 108 04/17/2022 1439   CHOLHDL 1.5 04/17/2022 1439  CHOLHDL 2.3 06/25/2007 0700   VLDL 11  06/25/2007 0700   LDLCALC 46 04/17/2022 1439    Physical Exam:    VS:  BP 113/65   Pulse 66   Ht 5\' 1"  (1.549 m)   Wt 174 lb 6.4 oz (79.1 kg)   SpO2 98%   BMI 32.95 kg/m     Wt Readings from Last 3 Encounters:  01/04/23 174 lb 6.4 oz (79.1 kg)  10/29/22 165 lb (74.8 kg)  08/10/22 171 lb 9.6 oz (77.8 kg)      General: Alert, oriented x3, no distress, healthy left subclavian ICD site Head: 1 cm linear laceration just across the bridge of the nose, fresh 4-5 cm hematoma between the eyebrows, PERRL, EOMI, no exophtalmos or lid lag, no myxedema, no xanthelasma; normal ears, nose and oropharynx Neck: normal jugular venous pulsations and no hepatojugular reflux; brisk carotid pulses without delay and no carotid bruits Chest: clear to auscultation, no signs of consolidation by percussion or palpation, normal fremitus, symmetrical and full respiratory excursions Cardiovascular: normal position and quality of the apical impulse, regular rhythm, normal first and second heart sounds, no murmurs, rubs or gallops Abdomen: no tenderness or distention, no masses by palpation, no abnormal pulsatility or arterial bruits, normal bowel sounds, no hepatosplenomegaly Extremities: no clubbing, cyanosis or edema; 2+ radial, ulnar and brachial pulses bilaterally; 2+ right femoral, posterior tibial and dorsalis pedis pulses; 2+ left femoral, posterior tibial and dorsalis pedis pulses; no subclavian or femoral bruits Neurological: Cranial nerves II-XII appear to be nonfocal, equal grasp strength in the left and right upper extremities, steady gait, normal deep tendon reflexes Psych: Normal mood and affect     ASSESSMENT:    1. Chronic combined systolic (congestive) and diastolic (congestive) heart failure (HCC)   2. PAH (pulmonary artery hypertension) (HCC)   3. ICD (implantable cardioverter-defibrillator) in place   4. PAT (paroxysmal atrial tachycardia)   5. Essential hypertension   6. CKD (chronic  kidney disease) stage 4, GFR 15-29 ml/min (HCC)   7. Diabetic peripheral neuropathy associated with type 2 diabetes mellitus (HCC)   8. Hypercholesterolemia   9. Coronary artery disease involving native coronary artery of native heart without angina pectoris   10. OSA on CPAP   11. Alkaline phosphatase elevation   12. Injury of head, initial encounter   13. History of cerebellar stroke     PLAN:    In order of problems listed above:  CHF: Clinically euvolemic appears to be NYHA functional class II.  Normal LVEF on her most recent echo.  Systolic PA pressure remains severely elevated at over 70 mmHg.  She does not have significant LVH but there are signs of diastolic dysfunction.  The mitral inflow shows only "impaired relaxation" pattern but there is a distinct L wave consistent with elevated mean left atrial pressure.  Nevertheless the E/e' ratio is not markedly elevated (medial 13, lateral 7).  Overall the impression is that she has at most mildly elevated left atrial pressures. PAH: Remarkably asymptomatic.  Not on active vasodilator therapy.  This is moderate-severe and largely independent of the left heart failure.  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  At her most recent cardiac catheterization in April 2022 the mean PA pressure was 33 mmHg in the setting of a mean PAWP of only 9 mmHg (transpulmonary gradient 26 mmHg).  It is likely related to longstanding undertreated sleep disordered breathing.  There is no evidence of significant fibrotic lung disease on high-resolution CT (performed  02/15/2020), although there is some evidence of restriction by pulmonary function tests.  She did not do well with PDE 5 inhibitors. ICD: Device is approaching ERI.  She has thought about her options and would like to receive a defibrillator.  -  She initially received her ICD in 2007 for dilated cardiomyopathy with EF 25%.  She underwent a generator change out in 2013 with Dr. Maudry Diego at [place].    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  -  She has had a Medtronic Sprint Fidelis [id number]-lead all this time.    Her RV lead is under advisory, but has shown excellent parameters and there does not appear to be a compelling reason to extract it.  She has never received therapy for ventricular arrhythmia from her device and does not require ventricular pacing.  Since she is now 82 years old and has never received therapy from the device and has recovered left ventricular systolic function, I suggested possibly "downgrading" the device to a pacemaker.  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  -  Has been thinking about it and wants to have a new defibrillator.  Will refer her to EP.  If she does receive a new defibrillator it could be worthwhile to put in a new ICD lead since her current Sprint Fidelis lead is under advisory, despite its normal parameters at this point.   Putting in a new lead would slightly increase the risk of complications at the time of generator change out, but but would still be much less risky than trying to extract the old lead.  If the subclavian vein is not patent and lead extraction is necessary, I would consider that to be a prohibitive risk procedure. considering her other comorbid conditions.  It might then be better to just keep the old lead. PAT: She recently had a 6-minute episode of atrial tachycardia with 2: 1 AV conduction (atrial rate approximately 200 bpm).  She had a similar event in May 2021 that was much longer at about an hour duration, but all these episodes have been asymptomatic and not associated with high ventricular rates.  No clear events recently.  On carvedilol.  True atrial fibrillation has never been reported. HTN: Very well-controlled CKD 4: Slight increase in creatinine to 2.07 from previous baseline around 1.8 (GFR is about 25).  In years past, creatinine was up to 2.5-3.4 range due to  overdiuresis. DM: Diet controlled, not requiring any medications.  Recent hemoglobin A1c was 6.0%. HLP: Still and lipid parameters on current therapy CAD: She has never had angina and she only had minor plaque at her last cardiac catheterization and minor carotid plaque by ultrasound.  On low-dose aspirin.  Aortic atherosclerosis is incidentally noted on chest CT. OSA: recommend 100% compliance w CPAP. Abnormal alkaline phosphatase: Not much change over the last 2 years.  Probably related to bone metabolism abnormalities in the setting of renal dysfunction. Head injury after fall: I felt very sorry that she fell in our office.  Thankfully it appears that she has not experienced any serious consequences from the fall even though there was clear head impact.  Head CT does not show any serious acute abnormalities.   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  These are new when compared to the most recent previous head CT performed in 2008.  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  She had bilateral carotid duplex ultrasonography performed in [place] in 2017.  This showed less than 50% plaque in both carotid arteries and antegrade flow in the right and left vertebral arteries.  Consider CT angiography, but I do not think this will change our treatment approach (focused primarily on lipid-lowering, antiplatelet therapy).  Patient Instructions  Medication Instructions:  No changes *If you need a refill on your cardiac medications before your next appointment, please call your pharmacy*  Follow-Up: At [facility], you and your health needs are our priority.  As part of our continuing mission to provide you with exceptional heart care, we have created designated Provider Care Teams.  These Care Teams  include your primary Cardiologist (physician) and Advanced Practice Providers (APPs -  Physician Assistants and Nurse Practitioners) who all work together to provide you with the care you need, when you need it.  We recommend signing up for the patient portal called "MyChart".  Sign up information is provided on this After Visit Summary.  MyChart is used to connect with patients for Virtual Visits (Telemedicine).  Patients are able to view lab/test results, encounter notes, upcoming appointments, etc.  Non-urgent messages can be sent to your provider as well.   To learn more about what you can do with MyChart, go to [email or web address].    Your next appointment:   6 month(s)  Provider:   Thurmon Fair, MD      Medication Adjustments/Labs and Tests Ordered: Patient Current medicines are reviewed at length with the patient today.  Concerns regarding medicines are outlined above.  Orders Placed This Encounter  Procedures   CT HEAD WO CONTRAST ( )   Ambulatory referral to Cardiac Electrophysiology   EKG 12-Lead   No orders of the defined types were placed in this encounter.   Signed, Thurmon Fair, MD  01/10/2023 4:21 PM    [facility]'

## 2023-01-11 ENCOUNTER — Ambulatory Visit (INDEPENDENT_AMBULATORY_CARE_PROVIDER_SITE_OTHER): Payer: Medicare PPO

## 2023-01-11 DIAGNOSIS — I5042 Chronic combined systolic (congestive) and diastolic (congestive) heart failure: Secondary | ICD-10-CM | POA: Diagnosis not present

## 2023-01-11 DIAGNOSIS — I4719 Other supraventricular tachycardia: Secondary | ICD-10-CM

## 2023-01-12 LAB — CUP PACEART REMOTE DEVICE CHECK
Battery Remaining Longevity: 2 mo
Battery Voltage: 2.79 V
Brady Statistic AP VP Percent: 1.26 %
Brady Statistic AP VS Percent: 80.52 %
Brady Statistic AS VP Percent: 0.11 %
Brady Statistic AS VS Percent: 18.12 %
Brady Statistic RA Percent Paced: 77.99 %
Brady Statistic RV Percent Paced: 1.58 %
Date Time Interrogation Session: 20240624044226
HighPow Impedance: 40 Ohm
HighPow Impedance: 54 Ohm
Implantable Pulse Generator Implant Date: 20131120
Lead Channel Impedance Value: 323 Ohm
Lead Channel Impedance Value: 380 Ohm
Lead Channel Impedance Value: 380 Ohm
Lead Channel Pacing Threshold Amplitude: 0.875 V
Lead Channel Pacing Threshold Amplitude: 0.875 V
Lead Channel Pacing Threshold Pulse Width: 0.4 ms
Lead Channel Pacing Threshold Pulse Width: 0.4 ms
Lead Channel Sensing Intrinsic Amplitude: 0.375 mV
Lead Channel Sensing Intrinsic Amplitude: 0.375 mV
Lead Channel Sensing Intrinsic Amplitude: 3.75 mV
Lead Channel Sensing Intrinsic Amplitude: 3.75 mV
Lead Channel Setting Pacing Amplitude: 1.75 V
Lead Channel Setting Pacing Amplitude: 2 V
Lead Channel Setting Pacing Pulse Width: 0.4 ms
Lead Channel Setting Sensing Sensitivity: 0.3 mV
Zone Setting Status: 755011
Zone Setting Status: 755011

## 2023-01-17 DIAGNOSIS — I10 Essential (primary) hypertension: Secondary | ICD-10-CM | POA: Diagnosis not present

## 2023-01-17 DIAGNOSIS — J449 Chronic obstructive pulmonary disease, unspecified: Secondary | ICD-10-CM | POA: Diagnosis not present

## 2023-01-17 DIAGNOSIS — G4733 Obstructive sleep apnea (adult) (pediatric): Secondary | ICD-10-CM | POA: Diagnosis not present

## 2023-01-17 DIAGNOSIS — I5089 Other heart failure: Secondary | ICD-10-CM | POA: Diagnosis not present

## 2023-01-29 NOTE — Progress Notes (Signed)
Remote ICD transmission.   

## 2023-02-05 DIAGNOSIS — I10 Essential (primary) hypertension: Secondary | ICD-10-CM | POA: Diagnosis not present

## 2023-02-05 DIAGNOSIS — J449 Chronic obstructive pulmonary disease, unspecified: Secondary | ICD-10-CM | POA: Diagnosis not present

## 2023-02-05 DIAGNOSIS — G4733 Obstructive sleep apnea (adult) (pediatric): Secondary | ICD-10-CM | POA: Diagnosis not present

## 2023-02-05 DIAGNOSIS — I5089 Other heart failure: Secondary | ICD-10-CM | POA: Diagnosis not present

## 2023-02-09 ENCOUNTER — Ambulatory Visit (HOSPITAL_BASED_OUTPATIENT_CLINIC_OR_DEPARTMENT_OTHER): Payer: Medicare PPO | Admitting: Pulmonary Disease

## 2023-02-10 DIAGNOSIS — G4733 Obstructive sleep apnea (adult) (pediatric): Secondary | ICD-10-CM | POA: Diagnosis not present

## 2023-02-11 ENCOUNTER — Ambulatory Visit: Payer: Medicare PPO

## 2023-02-11 DIAGNOSIS — I5042 Chronic combined systolic (congestive) and diastolic (congestive) heart failure: Secondary | ICD-10-CM

## 2023-02-11 LAB — CUP PACEART REMOTE DEVICE CHECK
Battery Remaining Longevity: 1 mo
Battery Voltage: 2.77 V
Brady Statistic AP VP Percent: 0.28 %
Brady Statistic AP VS Percent: 75.61 %
Brady Statistic AS VP Percent: 0.02 %
Brady Statistic AS VS Percent: 24.09 %
Brady Statistic RA Percent Paced: 74.71 %
Brady Statistic RV Percent Paced: 0.37 %
Date Time Interrogation Session: 20240725012505
HighPow Impedance: 42 Ohm
HighPow Impedance: 56 Ohm
Implantable Pulse Generator Implant Date: 20131120
Lead Channel Impedance Value: 304 Ohm
Lead Channel Impedance Value: 380 Ohm
Lead Channel Impedance Value: 399 Ohm
Lead Channel Pacing Threshold Amplitude: 0.75 V
Lead Channel Pacing Threshold Amplitude: 0.75 V
Lead Channel Pacing Threshold Pulse Width: 0.4 ms
Lead Channel Pacing Threshold Pulse Width: 0.4 ms
Lead Channel Sensing Intrinsic Amplitude: 0.5 mV
Lead Channel Sensing Intrinsic Amplitude: 0.5 mV
Lead Channel Sensing Intrinsic Amplitude: 4.125 mV
Lead Channel Sensing Intrinsic Amplitude: 4.125 mV
Lead Channel Setting Pacing Amplitude: 1.5 V
Lead Channel Setting Pacing Amplitude: 2 V
Lead Channel Setting Pacing Pulse Width: 0.4 ms
Lead Channel Setting Sensing Sensitivity: 0.3 mV
Zone Setting Status: 755011
Zone Setting Status: 755011

## 2023-02-16 DIAGNOSIS — J449 Chronic obstructive pulmonary disease, unspecified: Secondary | ICD-10-CM | POA: Diagnosis not present

## 2023-02-16 DIAGNOSIS — I5089 Other heart failure: Secondary | ICD-10-CM | POA: Diagnosis not present

## 2023-02-16 DIAGNOSIS — G4733 Obstructive sleep apnea (adult) (pediatric): Secondary | ICD-10-CM | POA: Diagnosis not present

## 2023-02-16 DIAGNOSIS — I10 Essential (primary) hypertension: Secondary | ICD-10-CM | POA: Diagnosis not present

## 2023-02-17 ENCOUNTER — Ambulatory Visit: Payer: Medicare PPO | Admitting: Internal Medicine

## 2023-02-22 NOTE — Progress Notes (Signed)
Remote ICD transmission.   

## 2023-03-04 ENCOUNTER — Ambulatory Visit: Payer: Medicare PPO | Attending: Cardiology | Admitting: Cardiology

## 2023-03-04 ENCOUNTER — Encounter: Payer: Self-pay | Admitting: Cardiology

## 2023-03-04 VITALS — BP 124/70 | HR 62 | Ht 61.0 in | Wt 174.8 lb

## 2023-03-04 DIAGNOSIS — I5042 Chronic combined systolic (congestive) and diastolic (congestive) heart failure: Secondary | ICD-10-CM

## 2023-03-04 DIAGNOSIS — I4719 Other supraventricular tachycardia: Secondary | ICD-10-CM | POA: Diagnosis not present

## 2023-03-04 NOTE — Progress Notes (Signed)
  Electrophysiology Office Note:   Date:  03/04/2023  ID:  OSHYN METRO, DOB 1940-09-22, MRN [id number]  Primary Cardiologist: Thurmon Fair, MD Electrophysiologist: None      History of Present Illness:   [name] is a 82 y.o. 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    She presents today as her ICD has reached ERI.  She has 1 month left on her battery.  She has a dual-chamber ICD.  She atrial paces 75% of the time and only ventricular paces 0.4%.  She currently feels well and is at her baseline.  Review of systems complete and found to be negative unless listed in HPI.      EP Information / Studies Reviewed:    EKG is not ordered today. EKG from 01/04/23 reviewed which showed atrial paced      ICD Interrogation-  reviewed in detail today,  See PACEART report.  Device History: Medtronic Dual Chamber ICD implanted 2007 for chronic systolic heart failure History of appropriate therapy: No History of AAD therapy: No   Risk Assessment/Calculations:              Physical Exam:   VS:  BP 124/70   Pulse 62   Ht 5\' 1"  (1.549 m)   Wt 174 lb 12.8 oz (79.3 kg)   SpO2 97%   BMI 33.03 kg/m    Wt Readings from Last 3 Encounters:  03/04/23 174 lb 12.8 oz (79.3 kg)  01/04/23 174 lb 6.4 oz (79.1 kg)  10/29/22 165 lb (74.8 kg)     GEN: Well nourished, well developed in no acute distress NECK: No JVD; No carotid bruits CARDIAC: Regular rate and rhythm, no murmurs, rubs, gallops RESPIRATORY:  Clear to auscultation without rales, wheezing or rhonchi  ABDOMEN: Soft, non-tender, non-distended EXTREMITIES:  No edema; No deformity   ASSESSMENT AND PLAN:    Chronic systolic dysfunction s/p Medtronic dual chamber ICD  euvolemic today Stable on an  appropriate medical regimen Normal ICD function See Arita Miss Art report Device is at [facility].  She  require additionally, she has a Sprint Fidelis lead.  Due to her age and improvement in ejection fraction, she would prefer pacemaker and not ICD.  Due to her Fidelis lead, if her axillary/subclavian vein is not occluded, we  plan on adding a pacemaker lead.  If not, we  plan on switching to a pacemaker from a defibrillator.  Risk and benefits have been discussed.  Risks include bleeding, tamponade, infection, pneumothorax, lead dislodgment, MI, death, stroke, renal failure.  She understands these risks and has agreed to the procedure.  2.  CKD stage IV:  require venogram prior to possible lead addition.   use a low contrast load.  3.  Coronary artery disease: Mild on most recent study.  Plan per primary cardiology.  Case discussed with primary cardiology  Disposition:   Follow up with Dr. Elberta Fortis as usual post procedure   Signed,  Jorja Loa, MD

## 2023-03-04 NOTE — Patient Instructions (Addendum)
Medication Instructions:  Your physician recommends that you continue on your current medications as directed. Please refer to the Current Medication list given to you today.  *If you need a refill on your cardiac medications before your next appointment, please call your pharmacy*  Lab Work: CBC, BMET  Testing/Procedures: Your physician has recommended you have an ICD generator change and RV pacing lead addition. Our EP procedure scheduler, April G, will call you to schedule your procedure and review instructions. Please allow 1-2 weeks for her to contact you.  Follow-Up: We will contact you to schedule your follow-up after your procedure.

## 2023-03-04 NOTE — H&P (View-Only) (Signed)
  Electrophysiology Office Note:   Date:  03/04/2023  ID:  [name], DOB 1940-09-22, MRN [id number]  Primary Cardiologist: Thurmon Fair, MD Electrophysiologist: None      History of Present Illness:   [name] is a 82 y.o. 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    She presents today as her ICD has reached ERI.  She has 1 month left on her battery.  She has a dual-chamber ICD.  She atrial paces 75% of the time and only ventricular paces 0.4%.  She currently feels well and is at her baseline.  Review of systems complete and found to be negative unless listed in HPI.      EP Information / Studies Reviewed:    EKG is not ordered today. EKG from 01/04/23 reviewed which showed atrial paced      ICD Interrogation-  reviewed in detail today,  See PACEART report.  Device History: Medtronic Dual Chamber ICD implanted 2007 for chronic systolic heart failure History of appropriate therapy: No History of AAD therapy: No   Risk Assessment/Calculations:              Physical Exam:   VS:  BP 124/70   Pulse 62   Ht 5\' 1"  (1.549 m)   Wt 174 lb 12.8 oz (79.3 kg)   SpO2 97%   BMI 33.03 kg/m    Wt Readings from Last 3 Encounters:  03/04/23 174 lb 12.8 oz (79.3 kg)  01/04/23 174 lb 6.4 oz (79.1 kg)  10/29/22 165 lb (74.8 kg)     GEN: Well nourished, well developed in no acute distress NECK: No JVD; No carotid bruits CARDIAC: Regular rate and rhythm, no murmurs, rubs, gallops RESPIRATORY:  Clear to auscultation without rales, wheezing or rhonchi  ABDOMEN: Soft, non-tender, non-distended EXTREMITIES:  No edema; No deformity   ASSESSMENT AND PLAN:    Chronic systolic dysfunction s/p Medtronic dual chamber ICD  euvolemic today Stable on an  appropriate medical regimen Normal ICD function See Arita Miss Art report Device is at [facility].  She  require additionally, she has a Sprint Fidelis lead.  Due to her age and improvement in ejection fraction, she would prefer pacemaker and not ICD.  Due to her Fidelis lead, if her axillary/subclavian vein is not occluded, we  plan on adding a pacemaker lead.  If not, we  plan on switching to a pacemaker from a defibrillator.  Risk and benefits have been discussed.  Risks include bleeding, tamponade, infection, pneumothorax, lead dislodgment, MI, death, stroke, renal failure.  She understands these risks and has agreed to the procedure.  2.  CKD stage IV:  require venogram prior to possible lead addition.   use a low contrast load.  3.  Coronary artery disease: Mild on most recent study.  Plan per primary cardiology.  Case discussed with primary cardiology  Disposition:   Follow up with Dr. Elberta Fortis as usual post procedure   Signed,  Jorja Loa, MD

## 2023-03-05 ENCOUNTER — Ambulatory Visit: Payer: Medicare PPO | Admitting: Cardiology

## 2023-03-05 LAB — BASIC METABOLIC PANEL
BUN/Creatinine Ratio: 20 (ref 12–28)
BUN: 36 mg/dL — ABNORMAL HIGH (ref 8–27)
CO2: 26 mmol/L (ref 20–29)
Calcium: 9.7 mg/dL (ref 8.7–10.3)
Chloride: 104 mmol/L (ref 96–106)
Creatinine, Ser: 1.76 mg/dL — ABNORMAL HIGH (ref 0.57–1.00)
Glucose: 119 mg/dL — ABNORMAL HIGH (ref 70–99)
Potassium: 4.6 mmol/L (ref 3.5–5.2)
Sodium: 144 mmol/L (ref 134–144)
eGFR: 28 mL/min/{1.73_m2} — ABNORMAL LOW (ref >59)

## 2023-03-05 LAB — CBC WITH DIFFERENTIAL/PLATELET
Basophils Absolute: 0 10*3/uL (ref 0.0–0.2)
Basos: 1 %
EOS (ABSOLUTE): 0.2 10*3/uL (ref 0.0–0.4)
Eos: 5 %
Hematocrit: 35.1 % (ref 34.0–46.6)
Hemoglobin: 11.4 g/dL (ref 11.1–15.9)
Immature Grans (Abs): 0 10*3/uL (ref 0.0–0.1)
Immature Granulocytes: 1 %
Lymphocytes Absolute: 1.3 10*3/uL (ref 0.7–3.1)
Lymphs: 35 %
MCH: 27.7 pg (ref 26.6–33.0)
MCHC: 32.5 g/dL (ref 31.5–35.7)
MCV: 85 fL (ref 79–97)
Monocytes Absolute: 0.5 10*3/uL (ref 0.1–0.9)
Monocytes: 14 %
Neutrophils Absolute: 1.7 10*3/uL (ref 1.4–7.0)
Neutrophils: 44 %
Platelets: 254 10*3/uL (ref 150–450)
RBC: 4.11 x10E6/uL (ref 3.77–5.28)
RDW: 13.2 % (ref 11.7–15.4)
WBC: 3.7 10*3/uL (ref 3.4–10.8)

## 2023-03-15 ENCOUNTER — Ambulatory Visit (INDEPENDENT_AMBULATORY_CARE_PROVIDER_SITE_OTHER): Payer: Medicare PPO

## 2023-03-15 DIAGNOSIS — I428 Other cardiomyopathies: Secondary | ICD-10-CM

## 2023-03-16 ENCOUNTER — Ambulatory Visit: Payer: Medicare PPO | Admitting: Podiatry

## 2023-03-16 VITALS — BP 162/66 | HR 102

## 2023-03-16 DIAGNOSIS — M79675 Pain in left toe(s): Secondary | ICD-10-CM | POA: Diagnosis not present

## 2023-03-16 DIAGNOSIS — B351 Tinea unguium: Secondary | ICD-10-CM | POA: Diagnosis not present

## 2023-03-16 DIAGNOSIS — M79674 Pain in right toe(s): Secondary | ICD-10-CM

## 2023-03-16 DIAGNOSIS — L84 Corns and callosities: Secondary | ICD-10-CM

## 2023-03-16 DIAGNOSIS — E1142 Type 2 diabetes mellitus with diabetic polyneuropathy: Secondary | ICD-10-CM

## 2023-03-16 LAB — CUP PACEART REMOTE DEVICE CHECK
Battery Remaining Longevity: 1 mo
Battery Voltage: 2.77 V
Brady Statistic AP VP Percent: 0.13 %
Brady Statistic AP VS Percent: 79.58 %
Brady Statistic AS VP Percent: 0.01 %
Brady Statistic AS VS Percent: 20.29 %
Brady Statistic RA Percent Paced: 79.11 %
Brady Statistic RV Percent Paced: 0.17 %
Date Time Interrogation Session: 20240826193726
HighPow Impedance: 39 Ohm
HighPow Impedance: 56 Ohm
Implantable Pulse Generator Implant Date: 20131120
Lead Channel Impedance Value: 266 Ohm
Lead Channel Impedance Value: 380 Ohm
Lead Channel Impedance Value: 380 Ohm
Lead Channel Pacing Threshold Amplitude: 0.75 V
Lead Channel Pacing Threshold Amplitude: 0.75 V
Lead Channel Pacing Threshold Pulse Width: 0.4 ms
Lead Channel Pacing Threshold Pulse Width: 0.4 ms
Lead Channel Sensing Intrinsic Amplitude: 0.625 mV
Lead Channel Sensing Intrinsic Amplitude: 0.625 mV
Lead Channel Sensing Intrinsic Amplitude: 3.5 mV
Lead Channel Sensing Intrinsic Amplitude: 3.5 mV
Lead Channel Setting Pacing Amplitude: 1.75 V
Lead Channel Setting Pacing Amplitude: 2 V
Lead Channel Setting Pacing Pulse Width: 0.4 ms
Lead Channel Setting Sensing Sensitivity: 0.3 mV
Zone Setting Status: 755011
Zone Setting Status: 755011

## 2023-03-19 DIAGNOSIS — G4733 Obstructive sleep apnea (adult) (pediatric): Secondary | ICD-10-CM | POA: Diagnosis not present

## 2023-03-19 DIAGNOSIS — J449 Chronic obstructive pulmonary disease, unspecified: Secondary | ICD-10-CM | POA: Diagnosis not present

## 2023-03-19 DIAGNOSIS — I5089 Other heart failure: Secondary | ICD-10-CM | POA: Diagnosis not present

## 2023-03-19 DIAGNOSIS — I10 Essential (primary) hypertension: Secondary | ICD-10-CM | POA: Diagnosis not present

## 2023-03-20 NOTE — Progress Notes (Signed)
  Subjective:  Patient ID: [name], female    DOB: Dec 10, 1940,  MRN: [id number]  [name] presents to clinic today for at risk foot care with history of diabetic neuropathy and callus(es) of both feet and painful thick toenails that are difficult to trim. Painful toenails interfere with ambulation. Aggravating factors include wearing enclosed shoe gear. Pain is relieved with periodic professional debridement. Painful calluses are aggravated when weightbearing with and without shoegear. Pain is relieved with periodic professional debridement. Her daughter is present during today's visit.  Chief Complaint  Patient presents with   Nail Problem    DFC/ Last PCP visit was in March or April. A1C -6/Patient has taken her medicine.   New problem(s): None.   PCP is Tysinger, Kermit Balo, PA-C.  Allergies  Allergen Reactions   Benazepril Hcl Other (See Comments)    Unknown   Lotrel [Amlodipine Besy-Benazepril Hcl] Other (See Comments)    Hypotension    Talwin [Pentazocine] Other (See Comments)    Hallucinations      Review of Systems: Negative except as noted in the HPI.  Objective: No changes noted in today's physical examination. Vitals:   03/16/23 1331  BP: (!) 162/66  Pulse: (!) 102   [name] is a pleasant 82 y.o. female WD, WN in NAD. AAO x 3.  Vascular Examination: CFT <3 seconds b/l. DP/PT pulses faintly palpable b/l. Skin temperature gradient warm to warm b/l. No pain with calf compression. No ischemia or gangrene. No cyanosis or clubbing noted b/l.    Neurological Examination: Vibratory sensation intact b/l. Protective sensation decreased with 10 gram monofilament b/l.  Dermatological Examination: Pedal skin warm and supple b/l.   No open wounds. No interdigital macerations.  Toenails 1-5 b/l thick, discolored, elongated with subungual debris and pain on dorsal palpation.    Hyperkeratotic lesion(s) submet head 1 b/l and submet head 5 b/l.  No erythema,  no edema, no drainage, no fluctuance.  Musculoskeletal Examination: Normal muscle strength 5/5 to all lower extremity muscle groups bilaterally. HAV with bunion bilaterally and hammertoes 2-5 b/l.[name] No pain, crepitus or joint limitation noted with ROM b/l LE.  Patient ambulates independently without assistive aids.  Radiographs: None  Last A1c:      Latest Ref Rng & Units 10/29/2022   12:28 PM 04/17/2022    2:39 PM  Hemoglobin A1C  Hemoglobin-A1c 4.0 - 5.6 % 6.0  6.3    Assessment/Plan: 1. Pain due to onychomycosis of toenails of both feet   2. Callus   3. Diabetic peripheral neuropathy associated with type 2 diabetes mellitus (HCC)     -Patient was evaluated and treated. All patient's and/or POA's questions/concerns answered on today's visit. -Consent given for treatment as described below: -Examined patient. -No new findings. No new orders. -Mycotic toenails 1-5 bilaterally were debrided in length and girth with sterile nail nippers and dremel without incident. -Callus(es) submet head 1 b/l and submet head 5 b/l pared utilizing sterile scalpel blade without complication or incident. Total number debrided =4. -Patient/POA to call should there be question/concern in the interim.   Return in about 3 months (around 06/16/2023).  Freddie Breech, DPM

## 2023-03-24 NOTE — Progress Notes (Signed)
Remote ICD transmission.   

## 2023-03-24 NOTE — Addendum Note (Signed)
Addended by: Geralyn Flash D on: 03/24/2023 02:16 PM   Modules accepted: Level of Service

## 2023-03-29 NOTE — Pre-Procedure Instructions (Signed)
Instructed patient on the following items: Arrival time 1:00 Nothing to eat or drink after midnight No meds AM of procedure Responsible person to drive you home and stay with you for 24 hrs Wash with special soap night before and morning of procedure

## 2023-03-30 ENCOUNTER — Encounter (HOSPITAL_COMMUNITY)
Admission: RE | Disposition: A | Discharge status: Home / Self Care | Payer: Medicare PPO | Source: Home / Self Care | Attending: Cardiology

## 2023-03-30 ENCOUNTER — Ambulatory Visit (HOSPITAL_COMMUNITY)
Admission: RE | Admit: 2023-03-30 | Discharge: 2023-03-30 | Disposition: A | Discharge status: Home / Self Care | Payer: Medicare PPO | Attending: Cardiology | Admitting: Cardiology

## 2023-03-30 ENCOUNTER — Other Ambulatory Visit: Payer: Self-pay

## 2023-03-30 DIAGNOSIS — N184 Chronic kidney disease, stage 4 (severe): Secondary | ICD-10-CM | POA: Diagnosis not present

## 2023-03-30 DIAGNOSIS — Z45010 Encounter for checking and testing of cardiac pacemaker pulse generator [battery]: Secondary | ICD-10-CM | POA: Insufficient documentation

## 2023-03-30 DIAGNOSIS — I2510 Atherosclerotic heart disease of native coronary artery without angina pectoris: Secondary | ICD-10-CM | POA: Diagnosis not present

## 2023-03-30 DIAGNOSIS — I130 Hypertensive heart and chronic kidney disease with heart failure and stage 1 through stage 4 chronic kidney disease, or unspecified chronic kidney disease: Secondary | ICD-10-CM | POA: Insufficient documentation

## 2023-03-30 DIAGNOSIS — I5022 Chronic systolic (congestive) heart failure: Secondary | ICD-10-CM | POA: Diagnosis not present

## 2023-03-30 DIAGNOSIS — E1122 Type 2 diabetes mellitus with diabetic chronic kidney disease: Secondary | ICD-10-CM | POA: Insufficient documentation

## 2023-03-30 DIAGNOSIS — I428 Other cardiomyopathies: Secondary | ICD-10-CM | POA: Diagnosis not present

## 2023-03-30 HISTORY — PX: ICD GENERATOR CHANGEOUT: EP1231

## 2023-03-30 LAB — GLUCOSE, CAPILLARY
Glucose-Capillary: 128 mg/dL — ABNORMAL HIGH (ref 70–99)
Glucose-Capillary: 85 mg/dL (ref 70–99)

## 2023-03-30 SURGERY — ICD GENERATOR CHANGEOUT

## 2023-03-30 MED ORDER — LIDOCAINE HCL (PF) 1 % IJ SOLN
INTRAMUSCULAR | Status: DC | PRN
  Administered 2023-03-30: 60 mL

## 2023-03-30 MED ORDER — ACETAMINOPHEN 325 MG PO TABS: 325.0000 mg | ORAL_TABLET | ORAL | Status: DC | PRN

## 2023-03-30 MED ORDER — CEFAZOLIN SODIUM-DEXTROSE 2-4 GM/100ML-% IV SOLN: 2.0000 g | INTRAVENOUS | Status: AC

## 2023-03-30 MED ORDER — CHLORHEXIDINE GLUCONATE 4 % EX SOLN
4.0000 | Freq: Once | CUTANEOUS | Status: DC
  Filled 2023-03-30: qty 60

## 2023-03-30 MED ORDER — SODIUM CHLORIDE 0.9 % IV SOLN: INTRAVENOUS | Status: DC

## 2023-03-30 MED ORDER — SODIUM CHLORIDE 0.9 % IV SOLN: 80.0000 mg | INTRAVENOUS | Status: AC

## 2023-03-30 MED ORDER — ONDANSETRON HCL 4 MG/2ML IJ SOLN: 4.0000 mg | Freq: Four times a day (QID) | INTRAMUSCULAR | Status: DC | PRN

## 2023-03-30 MED ORDER — LIDOCAINE HCL 1 % IJ SOLN
INTRAMUSCULAR | Status: AC
  Filled 2023-03-30: qty 60

## 2023-03-30 MED ORDER — CEFAZOLIN SODIUM-DEXTROSE 2-4 GM/100ML-% IV SOLN
INTRAVENOUS | Status: AC
  Administered 2023-03-30: 2 g via INTRAVENOUS
  Filled 2023-03-30: qty 100

## 2023-03-30 MED ORDER — HEPARIN (PORCINE) IN NACL 1000-0.9 UT/500ML-% IV SOLN
INTRAVENOUS | Status: DC | PRN
  Administered 2023-03-30: 500 mL

## 2023-03-30 MED ORDER — POVIDONE-IODINE 10 % EX SWAB
2.0000 | Freq: Once | CUTANEOUS | Status: AC
  Administered 2023-03-30: 2 via TOPICAL

## 2023-03-30 MED ORDER — SODIUM CHLORIDE 0.9 % IV SOLN
INTRAVENOUS | Status: AC
  Administered 2023-03-30: 80 mg
  Filled 2023-03-30: qty 2

## 2023-03-30 SURGICAL SUPPLY — 8 items
CABLE SURGICAL S-101-97-12 (CABLE) ×1 IMPLANT
IPG PACE AZUR XT DR MRI W1DR01 (Pacemaker) IMPLANT
KIT LEAD END CAP (Cap) IMPLANT
PACE AZURE XT DR MRI W1DR01 (Pacemaker) ×1 IMPLANT
PAD DEFIB RADIO PHYSIO CONN (PAD) ×1 IMPLANT
POUCH AIGIS-R ANTIBACT PPM (Mesh General) ×1 IMPLANT
POUCH AIGIS-R ANTIBACT PPM MED (Mesh General) IMPLANT
TRAY PACEMAKER INSERTION (PACKS) ×1 IMPLANT

## 2023-03-30 NOTE — Interval H&P Note (Signed)
History and Physical Interval Note:  03/30/2023 3:37 PM  [name]  has presented today for surgery, with the diagnosis of eri.  The various methods of treatment have been discussed with the patient and family. After consideration of risks, benefits and other options for treatment, the patient has consented to  Procedure(s): ICD GENERATOR CHANGEOUT (N/A) as a surgical intervention.  The patient's history has been reviewed, patient examined, no change in status, stable for surgery.  I have reviewed the patient's chart and labs.  Questions were answered to the patient's satisfaction.     Whyatt Klinger [employer]

## 2023-03-30 NOTE — Discharge Instructions (Signed)

## 2023-03-31 ENCOUNTER — Encounter (HOSPITAL_COMMUNITY): Payer: Self-pay | Admitting: Cardiology

## 2023-04-07 ENCOUNTER — Other Ambulatory Visit: Payer: Self-pay | Admitting: Medical

## 2023-04-08 ENCOUNTER — Encounter (HOSPITAL_BASED_OUTPATIENT_CLINIC_OR_DEPARTMENT_OTHER): Payer: Self-pay | Admitting: Pulmonary Disease

## 2023-04-08 ENCOUNTER — Ambulatory Visit (HOSPITAL_BASED_OUTPATIENT_CLINIC_OR_DEPARTMENT_OTHER): Payer: Medicare PPO | Admitting: Pulmonary Disease

## 2023-04-08 VITALS — BP 138/64 | HR 60 | Ht 62.0 in | Wt 178.0 lb

## 2023-04-08 DIAGNOSIS — G4733 Obstructive sleep apnea (adult) (pediatric): Secondary | ICD-10-CM | POA: Diagnosis not present

## 2023-04-08 DIAGNOSIS — J9611 Chronic respiratory failure with hypoxia: Secondary | ICD-10-CM | POA: Diagnosis not present

## 2023-04-08 DIAGNOSIS — Z23 Encounter for immunization: Secondary | ICD-10-CM | POA: Diagnosis not present

## 2023-04-08 NOTE — Patient Instructions (Signed)
Will have your auto CPAP setting changed to 12 - 20 cm water pressure  Follow up in 1 year

## 2023-04-08 NOTE — Progress Notes (Signed)
[facility]  Chief Complaint  Patient presents with   Sleep Apnea    DME [employer], 2L O2 at night    Constitutional:  BP 138/64   Pulse 60   Ht 5\' 2"  (1.575 m)   Wt 178 lb (80.7 kg)   SpO2 94%   BMI 32.56 kg/m   Past Medical History:  Anemia, CAD, Systolic CHF, CKD, HTN, Rectal cancer, CVA, DM  Past Surgical History:  She  has a past surgical history that includes Upper gi endoscopy (11/28/2009); Upper gastrointestinal endoscopy; Colonoscopy; Tonsillectomy and adenoidectomy; Tubal ligation; Colonoscopy with propofol (N/A, 03/29/2018); Esophagogastroduodenoscopy (egd) with propofol (N/A, 03/29/2018); biopsy (03/29/2018); polypectomy (03/29/2018); RIGHT HEART CATH (N/A, 02/15/2020); RIGHT HEART CATH (N/A, 10/31/2020); and ICD GENERATOR CHANGEOUT (N/A, 03/30/2023).  Brief Summary:  [name] is a 82 y.o. female former smoker with OSA, chronic respiratory failure and pulmonary hypertension.      Subjective:   She is here with her daughter.  Doing well with CPAP.  Using oxygen at night with this.  No issues with mask fit.  Feels rested.  Physical Exam:   Appearance - well kempt   ENMT - no sinus tenderness, no oral exudate, no LAN, Mallampati 3 airway, no stridor  Respiratory - equal breath sounds bilaterally, no wheezing or rales  CV - s1s2 regular rate and rhythm, no murmurs  Ext - no clubbing, no edema  Skin - no rashes  Psych - normal mood and affect    Pulmonary testing:  PFT 06/28/07 >> FEV1 0.73 (39%), FEV1% 92, TLC 2.41 (51%) PFT 05/04/17 >> FEV1 1.22 (79%), FEV1% 69, TLC 3.12 (75%), DLCO 39%, +BD PFT 08/26/20 >> FEV1 1.27 (95%), FEV1% 80, TLC 3.48 (73%), DLCO 60%  Chest Imaging:  HRCT chest 02/15/20 >> small b/l effusions, cardiomegaly, atherosclerosis, enlarged pulmonary artery 3.6 cm  Sleep Tests:  PSG 10/02/13 >> AHI 59.6, SpO2 low 80% PSG 07/05/14 >> AHI 15.1, SpO2 low 76% CPAP 01/06/18 to 04/05/18 >> used on  90 of 90 nights with average 9 hrs 9 min.  Average AHI 8.2 with CPAP 18 cm H2O. PSG 04/05/20 >> AHI 6.4, SpO2 low 83%, needed 2 liters Auto CPAP 03/07/23 to 04/05/23 >> used on 30 of 30 nights with average 7 hrs 24 min.  Average AHI 6 with median CPAP 17 and 95 th percentile CPAP 19 cm H2O  Cardiac Tests:  Echo 10/16/20 >> EF 55 to 60%, grade 1 DD, RVSP 72.6 mmHg, mod LA/RA dilation  Social History:  She  reports that she quit smoking about 14 years ago. Her smoking use included cigarettes. She has never used smokeless tobacco. She reports that she does not drink alcohol and does not use drugs.  Family History:  Her family history includes Colon polyps in her daughter; Diabetes in her brother; Hypertension in her brother, daughter, daughter, father, and mother; Stroke in her mother.     Assessment/Plan:   Obstructive sleep apnea. - she is compliant with CPAP and reports benefit from therapy - uses [employer] for her DME - current CPAP was ordered October 2021 - change to auto CPAP 12 to 20 cm H2O with 2 liters oxygen at night   Chronic respiratory failure with hypoxia. - goal SpO2 > 90% - uses 2 liters with exertion and with CPAP at night  Pulmonary hypertension, chronic combined CHF. - followed by Dr. Jomarie Longs with cardiology  CKD 3b. - followed by Dr. Elberta Spaniel with [facility]  Time Spent Involved in Patient Care on Day of Examination:  16 minutes  Follow up:   Patient Instructions  Will have your auto CPAP setting changed to 12 - 20 cm water pressure  Follow up in 1 year  Medication List:   Allergies as of 04/08/2023       Reactions   Benazepril Hcl Other (See Comments)   Unknown   Lotrel [amlodipine Besy-benazepril Hcl] Other (See Comments)   Hypotension   Talwin [pentazocine] Other (See Comments)   Hallucinations          Medication List        Accurate as of April 08, 2023  1:37 PM. If you have any questions, ask your nurse or doctor.           amLODipine 10 MG tablet Commonly known as: NORVASC Take 1 tablet (10 mg total) by mouth at bedtime.   aspirin 81 MG tablet Take 81 mg by mouth at bedtime.   atorvastatin 40 MG tablet Commonly known as: LIPITOR TAKE 1 TABLET EVERY DAY   carvedilol 3.125 MG tablet Commonly known as: COREG TAKE 1 TABLET TWICE DAILY WITH MEALS   DropSafe Alcohol Prep 70 % Pads TEST BLOOD SUGAR ONE TIME DAILY   ferrous sulfate 325 (65 FE) MG tablet Take 325 mg by mouth every Monday, Wednesday, and Friday.   ketoconazole 2 % cream Commonly known as: NIZORAL Apply to both feet and between toes once daily for 6 weeks. What changed:  how much to take how to take this when to take this   oxybutynin 5 MG tablet Commonly known as: DITROPAN Take 5 mg by mouth 3 (three) times daily. What changed: Another medication with the same name was changed. Make sure you understand how and when to take each.   oxybutynin 5 MG 24 hr tablet Commonly known as: DITROPAN-XL TAKE 1 TABLET(5 MG) BY MOUTH AT BEDTIME What changed: See the new instructions.   OXYGEN Inhale 2 L into the lungs at bedtime. Uses during the day as needed   potassium chloride 10 MEQ tablet Commonly known as: KLOR-CON Take 20 mEq by mouth daily as needed (When taking torsemide).   torsemide 20 MG tablet Commonly known as: DEMADEX Take 3 tablets (60 mg total) by mouth daily as needed.   True Metrix Blood Glucose Test test strip Generic drug: glucose blood TEST BLOOD SUGAR ONE TIME DAILY   True Metrix Level 1 Low Soln USE AS DIRECTED WITH GLUCOSE METER   True Metrix Meter Devi Test daily  Pt will need a true metrix meter E11.9   TRUEplus Lancets 33G Misc TEST BLOOD SUGAR ONE TIME DAILY   Vitamin D (Cholecalciferol) 25 MCG (1000 UT) Tabs Take 2,000 mg by mouth daily.        Signature:  Coralyn Helling, MD [facility] Pager - [phone] 04/08/2023, 1:37 PM

## 2023-04-13 ENCOUNTER — Ambulatory Visit: Payer: Medicare PPO

## 2023-04-13 DIAGNOSIS — Z0000 Encounter for general adult medical examination without abnormal findings: Secondary | ICD-10-CM | POA: Diagnosis not present

## 2023-04-13 NOTE — Patient Instructions (Signed)
[name] , Thank you for taking time to come for your Medicare Wellness Visit. I appreciate your ongoing commitment to your health goals. Please review the following plan we discussed and let me know if I can assist you in the future.   Referrals/Orders/Follow-Ups/Clinician Recommendations: none  This is a list of the screening recommended for you and due dates:  Health Maintenance  Topic Date Due   Zoster (Shingles) Vaccine (1 of 2) 07/[birth year]/1957   DTaP/Tdap/Td vaccine (2 - Td or Tdap) 01/11/2018   Yearly kidney health urinalysis for diabetes  10/18/2022   COVID-19 Vaccine (5 - 2023-24 season) 03/21/2023   Hemoglobin A1C  04/30/2023   Eye exam for diabetics  05/21/2023   Complete foot exam   10/29/2023   Yearly kidney function blood test for diabetes  03/03/2024   Medicare Annual Wellness Visit  04/12/2024   Pneumonia Vaccine  Completed   Flu Shot  Completed   DEXA scan (bone density measurement)  Completed   HPV Vaccine  Aged Out    Advanced directives: (In Chart) A copy of your advanced directives are scanned into your chart should your provider ever need it.  Next Medicare Annual Wellness Visit scheduled for next year: Yes  Insert Preventive Care attachment Insert FALL PREVENTION attachment if needed

## 2023-04-13 NOTE — Progress Notes (Signed)
Subjective:   [name] is a 82 y.o. female who presents for Medicare Annual (Subsequent) preventive examination.  Visit Complete: Virtual  I connected with  [name] on 04/13/23 by a audio enabled telemedicine application and verified that I am speaking with the correct person using two identifiers.  Patient Location: Home  Provider Location: Office/Clinic  I discussed the limitations of evaluation and management by telemedicine. The patient expressed understanding and agreed to proceed.  Vital Signs: Unable to obtain new vitals due to this being a telehealth visit.  Cardiac Risk Factors include: advanced age (>16men, >40 women);diabetes mellitus;hypertension     Objective:    Today's Vitals   There is no height or weight on file to calculate BMI.     04/13/2023    3:50 PM 03/30/2023    1:39 PM 09/08/2021   12:30 PM 04/16/2021   11:18 AM 10/31/2020    5:37 AM 04/08/2020   11:14 AM 04/06/2020    2:29 AM  Advanced Directives  Does Patient Have a Medical Advance Directive? Yes Yes Yes Yes Yes Yes Yes  Type of Advance Directive Out of facility DNR (pink MOST or yellow form) Healthcare Power of [place];Living will Healthcare Power of [place];Living will Living will;Healthcare Power of [employer] Power of [place];Living will Healthcare Power of [place];Living will Healthcare Power of [place];Living will  Does patient want to make changes to medical advance directive?  No - Patient declined   No - Patient declined No - Patient declined   Copy of Healthcare Power of Attorney in Chart?  No - copy requested  No - copy requested No - copy requested  No - copy requested    Current Medications (verified) Outpatient Encounter Medications as of 04/13/2023  Medication Sig   Alcohol Swabs (DROPSAFE ALCOHOL PREP) 70 % PADS TEST BLOOD SUGAR ONE TIME DAILY   amLODipine (NORVASC) 10 MG tablet Take 1 tablet (10 mg total) by mouth at bedtime.   aspirin 81 MG tablet Take  81 mg by mouth at bedtime.    atorvastatin (LIPITOR) 40 MG tablet TAKE 1 TABLET EVERY DAY   Blood Glucose Calibration (TRUE METRIX LEVEL 1) Low SOLN USE AS DIRECTED WITH GLUCOSE METER   Blood Glucose Monitoring Suppl (TRUE METRIX METER) DEVI Test daily  Pt will need a true metrix meter E11.9   carvedilol (COREG) 3.125 MG tablet TAKE 1 TABLET TWICE DAILY WITH MEALS   ferrous sulfate 325 (65 FE) MG tablet Take 325 mg by mouth every Monday, Wednesday, and Friday.   ketoconazole (NIZORAL) 2 % cream Apply to both feet and between toes once daily for 6 weeks. (Patient taking differently: Apply 1 Application topically 3 (three) times a week. Apply to both feet and between toes once daily for 6 weeks.)   oxybutynin (DITROPAN-XL) 5 MG 24 hr tablet TAKE 1 TABLET(5 MG) BY MOUTH AT BEDTIME (Patient taking differently: Take 5 mg by mouth daily as needed (bladder control). Take on the weekend if going out)   OXYGEN Inhale 2 L into the lungs at bedtime. Uses during the day as needed   potassium chloride (KLOR-CON) 10 MEQ tablet Take 20 mEq by mouth daily as needed (When taking torsemide).   torsemide (DEMADEX) 20 MG tablet Take 3 tablets (60 mg total) by mouth daily as needed.   TRUE METRIX BLOOD GLUCOSE TEST test strip TEST BLOOD SUGAR ONE TIME DAILY   TRUEplus Lancets 33G MISC TEST BLOOD SUGAR ONE TIME DAILY   Vitamin D,  Cholecalciferol, 25 MCG (1000 UT) TABS Take 2,000 mg by mouth daily.   oxybutynin (DITROPAN) 5 MG tablet Take 5 mg by mouth 3 (three) times daily. (Patient not taking: Reported on 04/13/2023)   No facility-administered encounter medications on file as of 04/13/2023.    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 Suzie Portela in [place]. Per notes she was due for colonoscopy in 12/2016   OSA on CPAP 09/08/2017   Other cardiomyopathies (HCC)    Oxygen deficiency    2 liters at night   Pulmonary HTN (HCC)    Sleep apnea    uses c-pap   Stroke [facility])    Type 2 diabetes, controlled, with peripheral neuropathy (HCC)    Urge incontinence of urine    Past Surgical History:  Procedure Laterality Date   BIOPSY  03/29/2018   Procedure: BIOPSY;  Surgeon: Benancio Deeds, MD;  Location: [facility];  Service: Gastroenterology;;   COLONOSCOPY     COLONOSCOPY WITH PROPOFOL N/A 03/29/2018   Procedure: COLONOSCOPY WITH PROPOFOL;  Surgeon: Benancio Deeds, MD;  Location: [facility];  Service: Gastroenterology;  Laterality: N/A;   ESOPHAGOGASTRODUODENOSCOPY (EGD) WITH PROPOFOL N/A 03/29/2018   Procedure: ESOPHAGOGASTRODUODENOSCOPY (EGD) WITH PROPOFOL;  Surgeon: Benancio Deeds, MD;  Location: [facility];  Service: Gastroenterology;  Laterality: N/A;   ICD GENERATOR CHANGEOUT N/A 03/30/2023   Procedure: ICD GENERATOR CHANGEOUT;  Surgeon: Regan Lemming, MD;  Location: [facility] INVASIVE CV LAB;  Service: Cardiovascular;  Laterality: N/A;   POLYPECTOMY  03/29/2018   Procedure: POLYPECTOMY;  Surgeon: Benancio Deeds, MD;  Location: [facility];  Service: Gastroenterology;;   RIGHT HEART CATH N/A 02/15/2020   Procedure: RIGHT HEART CATH;  Surgeon: Dolores Patty, MD;  Location: [facility] INVASIVE CV LAB;  Service: Cardiovascular;  Laterality: N/A;   RIGHT HEART CATH N/A 10/31/2020   Procedure: RIGHT HEART CATH;  Surgeon: [place], Peter M, MD;  Location: [facility] INVASIVE CV LAB;  Service: Cardiovascular;   Laterality: N/A;   TONSILLECTOMY AND ADENOIDECTOMY     age 55   TUBAL LIGATION     years ago   UPPER GASTROINTESTINAL ENDOSCOPY     UPPER GI ENDOSCOPY  11/28/2009   mild erosive esophagitis, mild erosive changes in stomach, chronic constipation   Family History  Problem Relation Age of Onset   Colon polyps Daughter    Hypertension Daughter    Hypertension Mother    Stroke Mother    Hypertension Father    Diabetes Brother    Hypertension Brother    Hypertension Daughter    Colon cancer Neg Hx    Esophageal cancer Neg Hx    Rectal cancer Neg Hx    Stomach cancer Neg Hx    Social History   Socioeconomic History   Marital status: Widowed    Spouse name: Not on file  Number of children: Not on file   Years of education: Not on file   Highest education level: Not on file  Occupational History   Not on file  Tobacco Use   Smoking status: Former    Current packs/day: 0.00    Types: Cigarettes    Quit date: 03/14/2009    Years since quitting: 14.0   Smokeless tobacco: Never   Tobacco comments:    quit smoking 2010  Vaping Use   Vaping status: Never Used  Substance and Sexual Activity   Alcohol use: No   Drug use: No   Sexual activity: Not on file  Other Topics Concern   Not on file  Social History Narrative   Lives alone.  2 daughters and 1 granddaughter in [place]   Social Determinants of Health   Financial Resource Strain: Low Risk  (04/13/2023)   Overall Financial Resource Strain (CARDIA)    Difficulty of Paying Living Expenses: Not hard at all  Food Insecurity: No Food Insecurity (04/13/2023)   Hunger Vital Sign    Worried About Running Out of Food in the Last Year: Never true    Ran Out of Food in the Last Year: Never true  Transportation Needs: No Transportation Needs (04/13/2023)   PRAPARE - Administrator, Civil Service (Medical): No    Lack of Transportation (Non-Medical): No  Physical Activity: Insufficiently Active (04/13/2023)   Exercise Vital  Sign    Days of Exercise per Week: 6 days    Minutes of Exercise per Session: 20 min  Stress: No Stress Concern Present (04/13/2023)   [employer] of Occupational Health - Occupational Stress Questionnaire    Feeling of Stress : Not at all  Social Connections: Moderately Integrated (04/13/2023)   Social Connection and Isolation Panel [NHANES]    Frequency of Communication with Friends and Family: More than three times a week    Frequency of Social Gatherings with Friends and Family: More than three times a week    Attends Religious Services: More than 4 times per year    Active Member of [employer] or Organizations: Yes    Attends Banker Meetings: More than 4 times per year    Marital Status: Widowed    Tobacco Counseling Counseling given: Not Answered Tobacco comments: quit smoking 2010   Clinical Intake:  Pre-visit preparation completed: Yes  Pain : No/denies pain     Nutritional Risks: Nausea/ vomitting/ diarrhea (diarrhea yesterday, resolved) Diabetes: Yes CBG done?: No Did pt. bring in CBG monitor from home?: No  How often do you need to have someone help you when you read instructions, pamphlets, or other written materials from your doctor or pharmacy?: 1 - Never  Interpreter Needed?: No  Information entered by :: NAllen LPN   Activities of Daily Living    04/13/2023    3:42 PM 04/17/2022    1:50 PM  In your present state of health, do you have any difficulty performing the following activities:  Hearing? 0 0  Vision? 0 0  Difficulty concentrating or making decisions? 0 0  Walking or climbing stairs? 1 0  Comment due to breathing   Dressing or bathing? 0 0  Doing errands, shopping? 1 0  Comment daughter's drive daughters go with her  Preparing Food and eating ? N N  Using the Toilet? N N  In the past six months, have you accidently leaked urine? Y N  Comment incontinence   Do you have problems with  loss of bowel control? N N  Managing your  Medications? N N  Managing your Finances? N N  Housekeeping or managing your Housekeeping? N N    Patient Care Team: Tysinger, Kermit Balo, PA-C as PCP - General (Family Medicine) Croitoru, Rachelle Hora, MD as PCP - Cardiology (Cardiology) Regan Lemming, MD as PCP - Electrophysiology (Cardiology) Armbruster, Willaim Rayas, MD as Consulting Physician (Gastroenterology) Maxie Barb, MD as Consulting Physician (Nephrology) Freddie Breech, DPM as Consulting Physician (Podiatry) Coralyn Helling, MD as Consulting Physician (Pulmonary Disease) [facility]  Indicate any recent Medical Services you may have received from other than [facility] providers in the past year (date may be approximate).     Assessment:   This is a routine wellness examination for [name].  Hearing/Vision screen Hearing Screening - Comments:: Denies hearing issues Vision Screening - Comments:: Regular eye exams, [facility]   Goals Addressed             This Visit's Progress    Patient Stated       04/13/2023, stay healthy and learn something new everyday       Depression Screen    04/13/2023    3:52 PM 10/29/2022   11:49 AM 04/17/2022    1:50 PM 04/16/2021   11:11 AM 12/25/2020   10:02 AM 10/16/2020    1:40 PM 04/10/2020    1:52 PM  PHQ 2/9 Scores  PHQ - 2 Score 0 0 0 0 0 0 0  PHQ- 9 Score 0          Fall Risk    04/13/2023    3:51 PM 10/29/2022   11:49 AM 04/17/2022    1:50 PM 10/17/2021    1:28 PM 09/08/2021   12:26 PM  Fall Risk   Falls in the past year? 1 1 1  0 0  Comment missed a step      Number falls in past yr: 0 0 0 0   Injury with Fall? 0 0 0 1   Risk for fall due to : Medication side effect;Impaired mobility;Impaired balance/gait Impaired balance/gait Impaired balance/gait Impaired balance/gait   Follow up Falls prevention discussed;Falls evaluation completed Falls evaluation completed Falls evaluation completed Falls evaluation completed;Education provided      MEDICARE RISK AT HOME: Medicare Risk at Home Any stairs in or around the home?: No If so, are there any without handrails?: No Home free of loose throw rugs in walkways, pet beds, electrical cords, etc?: Yes Adequate lighting in your home to reduce risk of falls?: Yes Life alert?: No Use of a cane, walker or w/c?: Yes Grab bars in the bathroom?: Yes Shower chair or bench in shower?: Yes Elevated toilet seat or a handicapped toilet?: Yes  TIMED UP AND GO:  Was the test performed?  No    Cognitive Function:        04/13/2023    3:53 PM  6CIT Screen  What Year? 0 points  What month? 0 points  What time? 0 points  Count back from 20 0 points  Months in reverse 0 points  Repeat phrase 0 points  Total Score 0 points    Immunizations Immunization History  Administered Date(s) Administered   Fluad Quad(high Dose 65+) 04/04/2019, 04/15/2020, 04/16/2021, 04/17/2022   Fluad Trivalent(High Dose 65+) 04/08/2023   Influenza, High Dose Seasonal PF 08/04/2016, 05/02/2017, 04/06/2018   Influenza-Unspecified 05/23/2013, 08/25/2013, 06/22/2014, 06/22/2014, 05/22/2015   PFIZER(Purple Top)SARS-COV-2 Vaccination 09/12/2019, 10/03/2019, 04/30/2020   PNEUMOCOCCAL CONJUGATE-20  10/29/2022   Pfizer Covid-19 Vaccine Bivalent Booster 60yrs & up 05/19/2021   Pneumococcal Conjugate-13 06/22/2014   Pneumococcal-Unspecified 01/12/2008   Tdap 01/12/2008   Zoster, Live 07/21/2011, 12/08/2011    TDAP status: Due, Education has been provided regarding the importance of this vaccine. Advised may receive this vaccine at local pharmacy or Health Dept. Aware to provide a copy of the vaccination record if obtained from local pharmacy or Health Dept. Verbalized acceptance and understanding.  Flu Vaccine status: Up to date  Pneumococcal vaccine status: Up to date  Covid-19 vaccine status: Information provided on how to obtain vaccines.   Qualifies for Shingles Vaccine? Yes   Zostavax completed Yes    Shingrix Completed?: No.    Education has been provided regarding the importance of this vaccine. Patient has been advised to call insurance company to determine out of pocket expense if they have not yet received this vaccine. Advised may also receive vaccine at local pharmacy or Health Dept. Verbalized acceptance and understanding.  Screening Tests Health Maintenance  Topic Date Due   Zoster Vaccines- Shingrix (1 of 2) 07/22/1959   DTaP/Tdap/Td (2 - Td or Tdap) 01/11/2018   Diabetic kidney evaluation - Urine ACR  10/18/2022   COVID-19 Vaccine (5 - 2023-24 season) 03/21/2023   HEMOGLOBIN A1C  04/30/2023   OPHTHALMOLOGY EXAM  05/21/2023   FOOT EXAM  10/29/2023   Diabetic kidney evaluation - eGFR measurement  03/03/2024   Medicare Annual Wellness (AWV)  04/12/2024   Pneumonia Vaccine 86+ Years old  Completed   INFLUENZA VACCINE  Completed   DEXA SCAN  Completed   HPV VACCINES  Aged Out    Health Maintenance  Health Maintenance Due  Topic Date Due   Zoster Vaccines- Shingrix (1 of 2) 07/22/1959   DTaP/Tdap/Td (2 - Td or Tdap) 01/11/2018   Diabetic kidney evaluation - Urine ACR  10/18/2022   COVID-19 Vaccine (5 - 2023-24 season) 03/21/2023    Colorectal cancer screening: No longer required.   Mammogram status: Completed 09/07/2022. Repeat every year  Bone Density status: Completed 01/13/2010.   Lung Cancer Screening: (Low Dose CT Chest recommended if Age 53-80 years, 20 pack-year currently smoking OR have quit w/in 15years.) does not qualify.   Lung Cancer Screening Referral: no  Additional Screening:  Hepatitis C Screening: does not qualify;   Vision Screening: Recommended annual ophthalmology exams for early detection of glaucoma and other disorders of the eye. Is the patient up to date with their annual eye exam?  Yes  Who is the provider or what is the name of the office in which the patient attends annual eye exams? [facility] If pt is not established with a  provider, would they like to be referred to a provider to establish care? No .   Dental Screening: Recommended annual dental exams for proper oral hygiene  Diabetic Foot Exam: Diabetic Foot Exam: Completed 10/29/2022  Community Resource Referral / Chronic Care Management: CRR required this visit?  No   CCM required this visit?  No     Plan:     I have personally reviewed and noted the following in the patient's chart:   Medical and social history Use of alcohol, tobacco or illicit drugs  Current medications and supplements including opioid prescriptions. Patient is not currently taking opioid prescriptions. Functional ability and status Nutritional status Physical activity Advanced directives List of other physicians Hospitalizations, surgeries, and ER visits in previous 12 months Vitals Screenings to include cognitive, depression, and  falls Referrals and appointments  In addition, I have reviewed and discussed with patient certain preventive protocols, quality metrics, and best practice recommendations. A written personalized care plan for preventive services as well as general preventive health recommendations were provided to patient.     Barb Merino, LPN   1/61/0960   After Visit Summary: (Pick Up) Due to this being a telephonic visit, with patients personalized plan was offered to patient and patient has requested to Pick up at office.  Nurse Notes: none

## 2023-04-14 ENCOUNTER — Ambulatory Visit: Payer: Medicare PPO

## 2023-04-16 ENCOUNTER — Telehealth: Payer: Self-pay

## 2023-04-16 ENCOUNTER — Ambulatory Visit: Payer: Medicare PPO | Attending: Cardiovascular Disease

## 2023-04-16 DIAGNOSIS — I495 Sick sinus syndrome: Secondary | ICD-10-CM

## 2023-04-16 LAB — CUP PACEART INCLINIC DEVICE CHECK
Battery Remaining Longevity: 144 mo
Battery Voltage: 3.22 V
Brady Statistic AP VP Percent: 2.31 %
Brady Statistic AP VS Percent: 83.78 %
Brady Statistic AS VP Percent: 0.18 %
Brady Statistic AS VS Percent: 13.72 %
Brady Statistic RA Percent Paced: 86.06 %
Brady Statistic RV Percent Paced: 2.49 %
Date Time Interrogation Session: 20240927082956
Implantable Pulse Generator Implant Date: 20240910
Lead Channel Impedance Value: 266 Ohm
Lead Channel Impedance Value: 285 Ohm
Lead Channel Impedance Value: 342 Ohm
Lead Channel Impedance Value: 380 Ohm
Lead Channel Pacing Threshold Amplitude: 0.75 V
Lead Channel Pacing Threshold Amplitude: 0.75 V
Lead Channel Pacing Threshold Amplitude: 0.75 V
Lead Channel Pacing Threshold Amplitude: 1 V
Lead Channel Pacing Threshold Pulse Width: 0.4 ms
Lead Channel Pacing Threshold Pulse Width: 0.4 ms
Lead Channel Pacing Threshold Pulse Width: 0.4 ms
Lead Channel Sensing Intrinsic Amplitude: 0.375 mV
Lead Channel Sensing Intrinsic Amplitude: 0.5 mV
Lead Channel Sensing Intrinsic Amplitude: 3.25 mV
Lead Channel Sensing Intrinsic Amplitude: 4 mV
Lead Channel Setting Pacing Amplitude: 2 V
Lead Channel Setting Pacing Amplitude: 2.25 V
Lead Channel Setting Pacing Pulse Width: 0.4 ms
Lead Channel Setting Sensing Sensitivity: 1.2 mV
Zone Setting Status: 755011
Zone Setting Status: 755011

## 2023-04-16 NOTE — Patient Instructions (Addendum)
After Your Pacemaker   Monitor your pacemaker site for redness, swelling, and drainage. Call the device clinic at [phone] if you experience these symptoms or fever/chills.  Your incision was closed with Steri-strips or staples:  You may shower 7 days after your procedure and wash your incision with soap and water. Avoid lotions, ointments, or perfumes over your incision until it is well-healed.  You may use a hot tub or a pool after your wound check appointment if the incision is completely closed.  Do not lift, push or pull greater than 10 pounds with the affected arm until 6 weeks after your procedure. There are no restrictions in arm movement after your wound check appointment.  You may drive, unless driving has been restricted by your healthcare providers.   Remote monitoring is used to monitor your pacemaker from home. This monitoring is scheduled every 91 days by our office. It allows [place] to keep an eye on the functioning of your device to ensure it is working properly. You will routinely see your Electrophysiologist annually (more often if necessary).

## 2023-04-16 NOTE — Progress Notes (Signed)
Wound check appointment. Steri-strips removed. Wound without redness. Slight edema noted. Incision edges approximated, wound well healed. Normal device function. Thresholds, sensing, and impedances consistent with implant measurements. Device programmed at chronic outputs post gen change. Histogram distribution appropriate for patient and level of activity. No mode switches or high ventricular rates noted. Patient educated about wound care. ROV in 3 months with implanting physician.  Atrial sensitivity reprogrammed to .15 mV for better safety margin.

## 2023-04-16 NOTE — Telephone Encounter (Signed)
Outreach made to Pt.  Pt will be here for AM appt.

## 2023-04-19 DIAGNOSIS — G4733 Obstructive sleep apnea (adult) (pediatric): Secondary | ICD-10-CM | POA: Diagnosis not present

## 2023-04-19 DIAGNOSIS — I5089 Other heart failure: Secondary | ICD-10-CM | POA: Diagnosis not present

## 2023-04-19 DIAGNOSIS — I10 Essential (primary) hypertension: Secondary | ICD-10-CM | POA: Diagnosis not present

## 2023-04-19 DIAGNOSIS — J449 Chronic obstructive pulmonary disease, unspecified: Secondary | ICD-10-CM | POA: Diagnosis not present

## 2023-05-06 ENCOUNTER — Ambulatory Visit: Payer: Medicare PPO | Admitting: Nurse Practitioner

## 2023-05-06 ENCOUNTER — Encounter: Payer: Self-pay | Admitting: Nurse Practitioner

## 2023-05-06 VITALS — BP 130/84 | HR 60 | Ht 60.75 in | Wt 171.4 lb

## 2023-05-06 DIAGNOSIS — I5042 Chronic combined systolic (congestive) and diastolic (congestive) heart failure: Secondary | ICD-10-CM | POA: Diagnosis not present

## 2023-05-06 DIAGNOSIS — Z0000 Encounter for general adult medical examination without abnormal findings: Secondary | ICD-10-CM | POA: Diagnosis not present

## 2023-05-06 DIAGNOSIS — I10 Essential (primary) hypertension: Secondary | ICD-10-CM

## 2023-05-06 DIAGNOSIS — J449 Chronic obstructive pulmonary disease, unspecified: Secondary | ICD-10-CM

## 2023-05-06 DIAGNOSIS — I739 Peripheral vascular disease, unspecified: Secondary | ICD-10-CM | POA: Diagnosis not present

## 2023-05-06 DIAGNOSIS — B360 Pityriasis versicolor: Secondary | ICD-10-CM

## 2023-05-06 DIAGNOSIS — J9611 Chronic respiratory failure with hypoxia: Secondary | ICD-10-CM

## 2023-05-06 DIAGNOSIS — E782 Mixed hyperlipidemia: Secondary | ICD-10-CM | POA: Diagnosis not present

## 2023-05-06 DIAGNOSIS — E1142 Type 2 diabetes mellitus with diabetic polyneuropathy: Secondary | ICD-10-CM | POA: Diagnosis not present

## 2023-05-06 DIAGNOSIS — N184 Chronic kidney disease, stage 4 (severe): Secondary | ICD-10-CM

## 2023-05-06 DIAGNOSIS — I428 Other cardiomyopathies: Secondary | ICD-10-CM

## 2023-05-06 DIAGNOSIS — Z6833 Body mass index (BMI) 33.0-33.9, adult: Secondary | ICD-10-CM | POA: Diagnosis not present

## 2023-05-06 DIAGNOSIS — Z23 Encounter for immunization: Secondary | ICD-10-CM | POA: Diagnosis not present

## 2023-05-06 DIAGNOSIS — E559 Vitamin D deficiency, unspecified: Secondary | ICD-10-CM

## 2023-05-06 DIAGNOSIS — K5901 Slow transit constipation: Secondary | ICD-10-CM

## 2023-05-06 DIAGNOSIS — G4733 Obstructive sleep apnea (adult) (pediatric): Secondary | ICD-10-CM | POA: Diagnosis not present

## 2023-05-06 MED ORDER — LINACLOTIDE 72 MCG PO CAPS
72.0000 ug | ORAL_CAPSULE | Freq: Every day | ORAL | 3 refills | Status: DC
Start: 2023-05-06 — End: 2024-05-18

## 2023-05-06 MED ORDER — LINACLOTIDE 72 MCG PO CAPS
72.0000 ug | ORAL_CAPSULE | Freq: Every day | ORAL | 3 refills | Status: DC
Start: 2023-05-06 — End: 2023-05-06

## 2023-05-06 MED ORDER — KETOCONAZOLE 2 % EX CREA
1.0000 | TOPICAL_CREAM | Freq: Every day | CUTANEOUS | 1 refills | Status: DC
Start: 2023-05-06 — End: 2024-05-18

## 2023-05-06 MED ORDER — ONETOUCH ULTRA 2 W/DEVICE KIT
PACK | 99 refills | Status: AC
Start: 2023-05-06 — End: ?

## 2023-05-06 NOTE — Patient Instructions (Addendum)
I have sent in the ketoconazole for your face. You will use this once a day for at least 3 weeks then you may go to 2-3 days a week. This is for the rash from the CPAP.   I have sent the new order for a glucometer to [facility].   I sent in the Linzess for you to try again for constipation. If this does not work you can stop taking it and let me know.
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
## 2023-05-07 LAB — CMP14+EGFR
ALT: 5 [IU]/L (ref 0–32)
AST: 14 [IU]/L (ref 0–40)
Albumin: 4.7 g/dL (ref 3.7–4.7)
Alkaline Phosphatase: 134 [IU]/L — ABNORMAL HIGH (ref 44–121)
BUN/Creatinine Ratio: 19 (ref 12–28)
BUN: 40 mg/dL — ABNORMAL HIGH (ref 8–27)
Bilirubin Total: 0.5 mg/dL (ref 0.0–1.2)
CO2: 25 mmol/L (ref 20–29)
Calcium: 9.7 mg/dL (ref 8.7–10.3)
Chloride: 103 mmol/L (ref 96–106)
Creatinine, Ser: 2.13 mg/dL — ABNORMAL HIGH (ref 0.57–1.00)
Globulin, Total: 3 g/dL (ref 1.5–4.5)
Glucose: 113 mg/dL — ABNORMAL HIGH (ref 70–99)
Potassium: 4.3 mmol/L (ref 3.5–5.2)
Sodium: 144 mmol/L (ref 134–144)
Total Protein: 7.7 g/dL (ref 6.0–8.5)
eGFR: 22 mL/min/{1.73_m2} — ABNORMAL LOW (ref >59)

## 2023-05-07 LAB — HEMOGLOBIN A1C
Est. average glucose Bld gHb Est-mCnc: 131 mg/dL
Hgb A1c MFr Bld: 6.2 % — ABNORMAL HIGH (ref 4.8–5.6)

## 2023-05-07 LAB — CBC WITH DIFFERENTIAL/PLATELET
Basophils Absolute: 0 10*3/uL (ref 0.0–0.2)
Basos: 1 %
EOS (ABSOLUTE): 0.1 10*3/uL (ref 0.0–0.4)
Eos: 3 %
Hematocrit: 35.2 % (ref 34.0–46.6)
Hemoglobin: 11.2 g/dL (ref 11.1–15.9)
Immature Grans (Abs): 0 10*3/uL (ref 0.0–0.1)
Immature Granulocytes: 0 %
Lymphocytes Absolute: 1.3 10*3/uL (ref 0.7–3.1)
Lymphs: 32 %
MCH: 27.3 pg (ref 26.6–33.0)
MCHC: 31.8 g/dL (ref 31.5–35.7)
MCV: 86 fL (ref 79–97)
Monocytes Absolute: 0.4 10*3/uL (ref 0.1–0.9)
Monocytes: 11 %
Neutrophils Absolute: 2.1 10*3/uL (ref 1.4–7.0)
Neutrophils: 53 %
Platelets: 259 10*3/uL (ref 150–450)
RBC: 4.1 x10E6/uL (ref 3.77–5.28)
RDW: 13.6 % (ref 11.7–15.4)
WBC: 4 10*3/uL (ref 3.4–10.8)

## 2023-05-07 LAB — LIPID PANEL
Chol/HDL Ratio: 1.7 {ratio} (ref 0.0–4.4)
Cholesterol, Total: 155 mg/dL (ref 100–199)
HDL: 92 mg/dL (ref >39)
LDL Chol Calc (NIH): 50 mg/dL (ref 0–99)
Triglycerides: 67 mg/dL (ref 0–149)
VLDL Cholesterol Cal: 13 mg/dL (ref 5–40)

## 2023-05-07 LAB — MICROALBUMIN / CREATININE URINE RATIO
Creatinine, Urine: 185.2 mg/dL
Microalb/Creat Ratio: 7 mg/g{creat} (ref 0–29)
Microalbumin, Urine: 13.8 ug/mL

## 2023-05-07 LAB — VITAMIN D 25 HYDROXY (VIT D DEFICIENCY, FRACTURES): Vit D, 25-Hydroxy: 28 ng/mL — ABNORMAL LOW (ref 30.0–100.0)

## 2023-05-17 ENCOUNTER — Ambulatory Visit (INDEPENDENT_AMBULATORY_CARE_PROVIDER_SITE_OTHER): Payer: Medicare PPO

## 2023-05-17 DIAGNOSIS — I495 Sick sinus syndrome: Secondary | ICD-10-CM

## 2023-05-17 DIAGNOSIS — B360 Pityriasis versicolor: Secondary | ICD-10-CM | POA: Insufficient documentation

## 2023-05-17 DIAGNOSIS — K5901 Slow transit constipation: Secondary | ICD-10-CM | POA: Insufficient documentation

## 2023-05-18 ENCOUNTER — Other Ambulatory Visit: Payer: Self-pay

## 2023-05-18 ENCOUNTER — Telehealth: Payer: Self-pay

## 2023-05-18 MED ORDER — LANCETS 30G MISC: 1.0000 | Freq: Every day | 2 refills | Status: AC

## 2023-05-18 MED ORDER — TRUE METRIX AIR GLUCOSE METER W/DEVICE KIT: 1.0000 | PACK | Freq: Every day | 0 refills | Status: DC

## 2023-05-18 MED ORDER — TRUE METRIX BLOOD GLUCOSE TEST VI STRP: ORAL_STRIP | 2 refills | Status: AC

## 2023-05-18 NOTE — Telephone Encounter (Signed)
Faxed request for [employer] True Metrix Air Meter Kit, [employer] True Metrix test strip (50), and Trueplus 30 G lancets. Pt approved this bc this is what they will cover.

## 2023-05-19 DIAGNOSIS — I5089 Other heart failure: Secondary | ICD-10-CM | POA: Diagnosis not present

## 2023-05-19 DIAGNOSIS — J449 Chronic obstructive pulmonary disease, unspecified: Secondary | ICD-10-CM | POA: Diagnosis not present

## 2023-05-19 DIAGNOSIS — G4733 Obstructive sleep apnea (adult) (pediatric): Secondary | ICD-10-CM | POA: Diagnosis not present

## 2023-05-19 DIAGNOSIS — I10 Essential (primary) hypertension: Secondary | ICD-10-CM | POA: Diagnosis not present

## 2023-05-19 LAB — CUP PACEART REMOTE DEVICE CHECK
Battery Remaining Longevity: 157 mo
Battery Voltage: 3.22 V
Brady Statistic AP VP Percent: 0.55 %
Brady Statistic AP VS Percent: 78.1 %
Brady Statistic AS VP Percent: 0.06 %
Brady Statistic AS VS Percent: 21.29 %
Brady Statistic RA Percent Paced: 79.13 %
Brady Statistic RV Percent Paced: 0.61 %
Date Time Interrogation Session: 20241027201615
Implantable Lead Connection Status: 753985
Implantable Lead Connection Status: 753985
Implantable Lead Implant Date: 20070309
Implantable Lead Implant Date: 20070309
Implantable Lead Location: 753859
Implantable Lead Location: 753860
Implantable Lead Model: 4076
Implantable Lead Model: 6947
Implantable Pulse Generator Implant Date: 20240910
Lead Channel Impedance Value: 266 Ohm
Lead Channel Impedance Value: 266 Ohm
Lead Channel Impedance Value: 323 Ohm
Lead Channel Impedance Value: 342 Ohm
Lead Channel Pacing Threshold Amplitude: 0.875 V
Lead Channel Pacing Threshold Amplitude: 0.875 V
Lead Channel Pacing Threshold Pulse Width: 0.4 ms
Lead Channel Pacing Threshold Pulse Width: 0.4 ms
Lead Channel Sensing Intrinsic Amplitude: 0.375 mV
Lead Channel Sensing Intrinsic Amplitude: 0.375 mV
Lead Channel Sensing Intrinsic Amplitude: 4.375 mV
Lead Channel Sensing Intrinsic Amplitude: 4.375 mV
Lead Channel Setting Pacing Amplitude: 1.75 V
Lead Channel Setting Pacing Amplitude: 2 V
Lead Channel Setting Pacing Pulse Width: 0.4 ms
Lead Channel Setting Sensing Sensitivity: 1.2 mV
Zone Setting Status: 755011
Zone Setting Status: 755011

## 2023-05-22 ENCOUNTER — Other Ambulatory Visit: Payer: Self-pay | Admitting: Cardiovascular Disease

## 2023-05-27 DIAGNOSIS — N184 Chronic kidney disease, stage 4 (severe): Secondary | ICD-10-CM | POA: Diagnosis not present

## 2023-05-27 DIAGNOSIS — N2581 Secondary hyperparathyroidism of renal origin: Secondary | ICD-10-CM | POA: Diagnosis not present

## 2023-05-27 DIAGNOSIS — I509 Heart failure, unspecified: Secondary | ICD-10-CM | POA: Diagnosis not present

## 2023-05-27 DIAGNOSIS — D631 Anemia in chronic kidney disease: Secondary | ICD-10-CM | POA: Diagnosis not present

## 2023-05-27 DIAGNOSIS — I130 Hypertensive heart and chronic kidney disease with heart failure and stage 1 through stage 4 chronic kidney disease, or unspecified chronic kidney disease: Secondary | ICD-10-CM | POA: Diagnosis not present

## 2023-05-27 DIAGNOSIS — R609 Edema, unspecified: Secondary | ICD-10-CM | POA: Diagnosis not present

## 2023-05-27 DIAGNOSIS — E559 Vitamin D deficiency, unspecified: Secondary | ICD-10-CM | POA: Diagnosis not present

## 2023-06-02 DIAGNOSIS — H02834 Dermatochalasis of left upper eyelid: Secondary | ICD-10-CM | POA: Diagnosis not present

## 2023-06-02 DIAGNOSIS — H43811 Vitreous degeneration, right eye: Secondary | ICD-10-CM | POA: Diagnosis not present

## 2023-06-02 DIAGNOSIS — Z961 Presence of intraocular lens: Secondary | ICD-10-CM | POA: Diagnosis not present

## 2023-06-02 DIAGNOSIS — H02831 Dermatochalasis of right upper eyelid: Secondary | ICD-10-CM | POA: Diagnosis not present

## 2023-06-02 DIAGNOSIS — E119 Type 2 diabetes mellitus without complications: Secondary | ICD-10-CM | POA: Diagnosis not present

## 2023-06-02 LAB — HM DIABETES EYE EXAM

## 2023-06-04 NOTE — Progress Notes (Signed)
Remote pacemaker transmission.   

## 2023-06-04 NOTE — Addendum Note (Signed)
Addended by: Geralyn Flash D on: 06/04/2023 12:25 PM   Modules accepted: Level of Service

## 2023-06-07 ENCOUNTER — Encounter: Payer: Self-pay | Admitting: Nurse Practitioner

## 2023-06-07 ENCOUNTER — Other Ambulatory Visit: Payer: Self-pay | Admitting: Medical

## 2023-06-14 DIAGNOSIS — G4733 Obstructive sleep apnea (adult) (pediatric): Secondary | ICD-10-CM | POA: Diagnosis not present

## 2023-06-16 ENCOUNTER — Other Ambulatory Visit: Payer: Self-pay | Admitting: Nurse Practitioner

## 2023-06-16 DIAGNOSIS — Z1231 Encounter for screening mammogram for malignant neoplasm of breast: Secondary | ICD-10-CM

## 2023-06-20 ENCOUNTER — Other Ambulatory Visit: Payer: Self-pay | Admitting: Medical

## 2023-06-23 ENCOUNTER — Encounter: Payer: Self-pay | Admitting: Podiatry

## 2023-06-23 ENCOUNTER — Ambulatory Visit: Payer: Medicare PPO | Admitting: Podiatry

## 2023-06-23 VITALS — Ht 60.0 in | Wt 171.4 lb

## 2023-06-23 DIAGNOSIS — M79675 Pain in left toe(s): Secondary | ICD-10-CM | POA: Diagnosis not present

## 2023-06-23 DIAGNOSIS — E1142 Type 2 diabetes mellitus with diabetic polyneuropathy: Secondary | ICD-10-CM | POA: Diagnosis not present

## 2023-06-23 DIAGNOSIS — M79674 Pain in right toe(s): Secondary | ICD-10-CM

## 2023-06-23 DIAGNOSIS — L84 Corns and callosities: Secondary | ICD-10-CM | POA: Diagnosis not present

## 2023-06-23 DIAGNOSIS — B351 Tinea unguium: Secondary | ICD-10-CM | POA: Diagnosis not present

## 2023-06-23 NOTE — Progress Notes (Signed)
Subjective:  Patient ID: [name], female    DOB: 19-Aug-1940,  MRN: [id number]  82 y.o. female presents with at risk foot care with history of diabetic neuropathy and callus(es) b/l feet and painful thick toenails that are difficult to trim. Painful toenails interfere with ambulation. Aggravating factors include wearing enclosed shoe gear. Pain is relieved with periodic professional debridement. Painful calluses are aggravated when weightbearing with and without shoegear. Pain is relieved with periodic professional debridement.  She is accompanied by her daughter on today's visit Chief Complaint  Patient presents with   Nail Problem    Pt Is here for [facility], pt is unsure of last A1C PCP is Dr Arbie Cookey and LOV was in September.    PCP: Tollie Eth, NP.  New problem(s): None.   Review of Systems: Negative except as noted in the HPI.   Allergies  Allergen Reactions   Benazepril Hcl Other (See Comments)    Unknown   Lotrel [Amlodipine Besy-Benazepril Hcl] Other (See Comments)    Hypotension    Talwin [Pentazocine] Other (See Comments)    Hallucinations      Objective:  There were no vitals filed for this visit. Constitutional Patient is a pleasant 82 y.o. female WD, WN in NAD. AAO x 3.  Vascular Capillary fill time to digits <3 seconds.  DP/PT pulse(s) are faintly palpable b/l lower extremities. Pedal hair absent b/l. Lower extremity skin temperature gradient warm to cool b/l. No pain with calf compression b/l. No cyanosis or clubbing noted. No ischemia nor gangrene noted b/l.   Neurologic Protective sensation decreased with 10 gram monofilament b/l.  Dermatologic Pedal skin is thin, shiny and atrophic b/l.  No open wounds b/l lower extremities. No interdigital macerations b/l lower extremities. Toenails 1-5 b/l elongated, discolored, dystrophic, thickened, crumbly with subungual debris and tenderness to dorsal palpation. Hyperkeratotic lesion(s) submet head 1 b/l and submet head 5 b/l.   No erythema, no edema, no drainage, no fluctuance.  Orthopedic: Normal muscle strength 5/5 to all lower extremity muscle groups bilaterally. 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   Last HgA1c:     Latest Ref Rng & Units 05/06/2023    2:39 PM 10/29/2022   12:28 PM  Hemoglobin A1C  Hemoglobin-A1c 4.8 - 5.6 % 6.2  6.0      Assessment:   1. Pain due to onychomycosis of toenails of both feet   2. Callus   3. Diabetic peripheral neuropathy associated with type 2 diabetes mellitus (HCC)    Plan:  Patient was evaluated and treated and all questions answered. -Continue foot and shoe inspections daily. Monitor blood glucose per PCP/Endocrinologist's recommendations. -Patient to continue soft, supportive shoe gear daily. -Toenails 1-5 b/l were debrided in length and girth with sterile nail nippers and dremel without iatrogenic bleeding.  -Callus(es) submet head 1 b/l and submet head 5 b/l pared utilizing sterile scalpel blade without complication or incident. Total number debrided =4. -Patient/POA to call should there be question/concern in the interim.  Return in about 3 months (around 09/21/2023).  Freddie Breech, DPM      [facility] LOCATION: [address], [place] [address]  Office [phone]   [facility] LOCATION: [address] [place], [place] [address] Office [phone]

## 2023-06-29 ENCOUNTER — Encounter: Payer: Medicare PPO | Admitting: Pulmonary Disease

## 2023-07-06 ENCOUNTER — Ambulatory Visit: Payer: Medicare PPO | Attending: Cardiovascular Disease | Admitting: Cardiovascular Disease

## 2023-07-06 VITALS — BP 122/68 | HR 62 | Ht 63.0 in | Wt 172.2 lb

## 2023-07-06 DIAGNOSIS — I2721 Secondary pulmonary arterial hypertension: Secondary | ICD-10-CM

## 2023-07-06 DIAGNOSIS — I4719 Other supraventricular tachycardia: Secondary | ICD-10-CM | POA: Diagnosis not present

## 2023-07-06 DIAGNOSIS — E1142 Type 2 diabetes mellitus with diabetic polyneuropathy: Secondary | ICD-10-CM | POA: Diagnosis not present

## 2023-07-06 DIAGNOSIS — I10 Essential (primary) hypertension: Secondary | ICD-10-CM | POA: Diagnosis not present

## 2023-07-06 DIAGNOSIS — I495 Sick sinus syndrome: Secondary | ICD-10-CM | POA: Diagnosis not present

## 2023-07-06 DIAGNOSIS — I5042 Chronic combined systolic (congestive) and diastolic (congestive) heart failure: Secondary | ICD-10-CM

## 2023-07-06 DIAGNOSIS — Z950 Presence of cardiac pacemaker: Secondary | ICD-10-CM | POA: Diagnosis not present

## 2023-07-06 DIAGNOSIS — E7800 Pure hypercholesterolemia, unspecified: Secondary | ICD-10-CM

## 2023-07-06 DIAGNOSIS — I2510 Atherosclerotic heart disease of native coronary artery without angina pectoris: Secondary | ICD-10-CM

## 2023-07-06 DIAGNOSIS — N184 Chronic kidney disease, stage 4 (severe): Secondary | ICD-10-CM

## 2023-07-06 NOTE — Patient Instructions (Signed)
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
## 2023-07-12 ENCOUNTER — Other Ambulatory Visit: Payer: Self-pay | Admitting: Cardiovascular Disease

## 2023-08-17 ENCOUNTER — Ambulatory Visit: Payer: Medicare PPO

## 2023-08-17 DIAGNOSIS — I495 Sick sinus syndrome: Secondary | ICD-10-CM

## 2023-08-22 ENCOUNTER — Other Ambulatory Visit: Payer: Self-pay | Admitting: Cardiovascular Disease

## 2023-09-13 DIAGNOSIS — G4733 Obstructive sleep apnea (adult) (pediatric): Secondary | ICD-10-CM | POA: Diagnosis not present

## 2023-09-15 ENCOUNTER — Other Ambulatory Visit: Payer: Self-pay | Admitting: Nurse Practitioner

## 2023-09-16 ENCOUNTER — Ambulatory Visit
Admission: RE | Admit: 2023-09-16 | Discharge: 2023-09-16 | Disposition: A | Discharge status: Home / Self Care | Payer: Medicare PPO | Source: Ambulatory Visit | Attending: Nurse Practitioner

## 2023-09-16 DIAGNOSIS — Z1231 Encounter for screening mammogram for malignant neoplasm of breast: Secondary | ICD-10-CM

## 2023-09-27 NOTE — Progress Notes (Signed)
 Remote pacemaker transmission.

## 2023-09-29 ENCOUNTER — Encounter: Payer: Self-pay | Admitting: Nurse Practitioner

## 2023-10-05 ENCOUNTER — Ambulatory Visit: Payer: Medicare PPO | Admitting: Podiatry

## 2023-10-05 ENCOUNTER — Encounter: Payer: Self-pay | Admitting: Podiatry

## 2023-10-05 VITALS — Ht 63.0 in | Wt 172.2 lb

## 2023-10-05 DIAGNOSIS — M79674 Pain in right toe(s): Secondary | ICD-10-CM

## 2023-10-05 DIAGNOSIS — M79675 Pain in left toe(s): Secondary | ICD-10-CM

## 2023-10-05 DIAGNOSIS — B351 Tinea unguium: Secondary | ICD-10-CM | POA: Diagnosis not present

## 2023-10-05 DIAGNOSIS — E1142 Type 2 diabetes mellitus with diabetic polyneuropathy: Secondary | ICD-10-CM | POA: Diagnosis not present

## 2023-10-05 DIAGNOSIS — L84 Corns and callosities: Secondary | ICD-10-CM | POA: Diagnosis not present

## 2023-10-10 NOTE — Progress Notes (Signed)
 Subjective:  Patient ID: [name], female    DOB: 07/18/1941,  MRN: [id number]  [name] presents to clinic today for at risk foot care with history of diabetic neuropathy and callus(es) of both feet and painful mycotic toenails that are difficult to trim. Painful toenails interfere with ambulation. Aggravating factors include wearing enclosed shoe gear. Pain is relieved with periodic professional debridement. Painful calluses are aggravated when weightbearing with and without shoegear. Pain is relieved with periodic professional debridement.  Chief Complaint  Patient presents with   Nail Problem    Pt is here for [facility] A1C was 6 PCP is Dr Arbie Cookey and LOV was in October.   New problem(s): None.   PCP is Early, Sung Amabile, NP.  Allergies  Allergen Reactions   Benazepril Hcl Other (See Comments)    Unknown   Lotrel [Amlodipine Besy-Benazepril Hcl] Other (See Comments)    Hypotension    Talwin [Pentazocine] Other (See Comments)    Hallucinations      Review of Systems: Negative except as noted in the HPI.  Objective:  There were no vitals filed for this visit. [name] is a pleasant 83 y.o. female WD, WN in NAD. AAO x 3.  Vascular Examination: CFT <3 seconds b/l. DP/PT pulses faintly palpable b/l. Skin temperature gradient warm to warm b/l. No pain with calf compression. No ischemia or gangrene. No cyanosis or clubbing noted b/l. Pedal hair absent.   Neurological Examination: Protective sensation decreased with 10 gram monofilament b/l.  Dermatological Examination: Pedal skin thin, shiny and atrophic b/l LE. No open wounds b/l LE. No interdigital macerations noted b/l LE. Toenails 1-5 bilaterally elongated, discolored, dystrophic, thickened, and crumbly with subungual debris and tenderness to dorsal palpation. Hyperkeratotic lesion(s) submet head 1 left foot, submet head 1 right foot, submet head 5 left foot, and submet head 5 right foot.  No erythema, no edema, no  drainage, no fluctuance.  Musculoskeletal Examination: Muscle strength 5/5 to all lower extremity muscle groups bilaterally. HAV with bunion bilaterally and hammertoes 2-5 b/l.  Radiographs: None  Last A1c:      Latest Ref Rng & Units 05/06/2023    2:39 PM 10/29/2022   12:28 PM  Hemoglobin A1C  Hemoglobin-A1c 4.8 - 5.6 % 6.2  6.0     Assessment/Plan: 1. Pain due to onychomycosis of toenails of both feet   2. Callus   3. Diabetic peripheral neuropathy associated with type 2 diabetes mellitus [facility])    Consent given for treatment. Patient examined. All patient's and/or POA's questions/concerns addressed on today's visit. Mycotic toenails 1-5 debrided in length and girth without incident. Callus(es) submet head 1 left foot, submet head 1 right foot, submet head 5 left foot, and submet head 5 right foot pared with sharp debridement without incident. Continue foot and shoe inspections daily. Monitor blood glucose per PCP/Endocrinologist's recommendations.Continue soft, supportive shoe gear daily. Report any pedal injuries to medical professional. Call office if there are any quesitons/concerns. -Patient/POA to call should there be question/concern in the interim.   Return in about 3 months (around 01/05/2024).  Freddie Breech, DPM      [facility] LOCATION: [address], [place] [address]  Office [phone]   [facility] LOCATION: [address] [place], [place] [address] Office [phone]

## 2023-11-04 ENCOUNTER — Other Ambulatory Visit: Payer: Self-pay

## 2023-11-04 ENCOUNTER — Encounter: Payer: Medicare PPO | Admitting: Family Medicine

## 2023-11-04 MED ORDER — OXYBUTYNIN CHLORIDE ER 5 MG PO TB24
5.0000 mg | ORAL_TABLET | Freq: Every day | ORAL | 1 refills | Status: DC
Start: 2023-11-04 — End: 2024-04-12

## 2023-11-08 ENCOUNTER — Encounter: Payer: Self-pay | Admitting: Nurse Practitioner

## 2023-11-08 ENCOUNTER — Ambulatory Visit: Payer: Medicare PPO | Admitting: Nurse Practitioner

## 2023-11-08 VITALS — BP 128/82 | HR 60 | Wt 174.8 lb

## 2023-11-08 DIAGNOSIS — C20 Malignant neoplasm of rectum: Secondary | ICD-10-CM

## 2023-11-08 DIAGNOSIS — I739 Peripheral vascular disease, unspecified: Secondary | ICD-10-CM

## 2023-11-08 DIAGNOSIS — I272 Pulmonary hypertension, unspecified: Secondary | ICD-10-CM | POA: Diagnosis not present

## 2023-11-08 DIAGNOSIS — I428 Other cardiomyopathies: Secondary | ICD-10-CM

## 2023-11-08 DIAGNOSIS — J449 Chronic obstructive pulmonary disease, unspecified: Secondary | ICD-10-CM | POA: Diagnosis not present

## 2023-11-08 DIAGNOSIS — E559 Vitamin D deficiency, unspecified: Secondary | ICD-10-CM | POA: Diagnosis not present

## 2023-11-08 DIAGNOSIS — I2721 Secondary pulmonary arterial hypertension: Secondary | ICD-10-CM

## 2023-11-08 DIAGNOSIS — I5042 Chronic combined systolic (congestive) and diastolic (congestive) heart failure: Secondary | ICD-10-CM | POA: Diagnosis not present

## 2023-11-08 DIAGNOSIS — N184 Chronic kidney disease, stage 4 (severe): Secondary | ICD-10-CM

## 2023-11-08 DIAGNOSIS — E1142 Type 2 diabetes mellitus with diabetic polyneuropathy: Secondary | ICD-10-CM

## 2023-11-08 DIAGNOSIS — K5901 Slow transit constipation: Secondary | ICD-10-CM

## 2023-11-08 DIAGNOSIS — E611 Iron deficiency: Secondary | ICD-10-CM

## 2023-11-08 DIAGNOSIS — J9611 Chronic respiratory failure with hypoxia: Secondary | ICD-10-CM

## 2023-11-08 DIAGNOSIS — I4719 Other supraventricular tachycardia: Secondary | ICD-10-CM | POA: Diagnosis not present

## 2023-11-08 DIAGNOSIS — G4733 Obstructive sleep apnea (adult) (pediatric): Secondary | ICD-10-CM

## 2023-11-08 DIAGNOSIS — I10 Essential (primary) hypertension: Secondary | ICD-10-CM

## 2023-11-08 NOTE — Progress Notes (Signed)
 [name], DNP, AGNP-c [facility]  [address] [place], [place] [address] [phone]  ESTABLISHED PATIENT- Chronic Health and/or Follow-Up Visit  Blood pressure 128/82, pulse 60, weight 174 lb 12.8 oz (79.3 kg).    [name] is a 83 y.o. year old female presenting today for evaluation and management of chronic conditions.  History of Present Illness [name] is an 83 year old female who presents with concerns about bowel movements and breathing difficulties.  She experiences infrequent bowel movements, approximately once a week or every two weeks, which she feels is insufficient. Linzess  has provided only slight improvement, and she still feels incomplete evacuation. She acknowledges inadequate water intake.  She experiences occasional dizziness and describes her equilibrium as 'awful low.' No palpitations or unusual heart sensations are noted. She regularly checks her blood sugar, with a recent reading of 104 mg/dL at 2:13 AM.  She uses a CPAP machine with oxygen  at night and reports sleeping well. No swelling in her feet is noted, and she states she is urinating normally. She has not taken her weekly dose of torsemide  due to the appointment, which she usually takes on Mondays.  She experiences shortness of breath at times, and her caregiver notes increased wheezing when she needs to take torsemide . She does not use an inhaler for her COPD but has used a nebulizer in the past after a hospitalization for CHF in 2019-2020.  No cough, fever, or chills are present, but she mentions sneezing due to pollen. She reports occasional numbness or tingling in her feet and cramps in her legs after taking potassium. Her current medications include atorvastatin , iron on Monday, Wednesday, and Friday, and vitamin D  daily.  All ROS negative with exception of what is listed above.   PHYSICAL EXAM Physical Exam Vitals and nursing note reviewed.  Constitutional:       General: She is not in acute distress.    Appearance: Normal appearance. She is not ill-appearing.  HENT:     Head: Normocephalic.  Eyes:     Conjunctiva/sclera: Conjunctivae normal.  Neck:     Vascular: No carotid bruit.  Cardiovascular:     Rate and Rhythm: Normal rate and regular rhythm.     Heart sounds: Murmur heard.  Pulmonary:     Breath sounds: Wheezing and rhonchi present.  Abdominal:     General: Bowel sounds are normal.     Palpations: Abdomen is soft.  Musculoskeletal:     Cervical back: Neck supple.     Right lower leg: No edema.     Left lower leg: No edema.  Skin:    General: Skin is warm and dry.     Capillary Refill: Capillary refill takes less than 2 seconds.  Neurological:     General: No focal deficit present.     Mental Status: She is alert and oriented to person, place, and time.     Sensory: No sensory deficit.     Motor: Weakness present.  Psychiatric:        Mood and Affect: Mood normal.        Behavior: Behavior normal.      PLAN Problem List Items Addressed This Visit     Essential hypertension (Chronic)   BP well controlled at this time. Goal BP less than 140/85. No changes to plan of care.       Relevant Orders   CBC with Differential/Platelet (Completed)   Comprehensive metabolic panel with GFR (Completed)   COPD (chronic obstructive  pulmonary disease) (HCC) (Chronic)   COPD with occasional dyspnea and lung crackles. No current inhaler use; previously used nebulizer post-CHF hospitalization. No recent exacerbations. - Monitor respiratory symptoms and report worsening - Consider re-evaluation of inhaler use if symptoms persist      OSA on CPAP (Chronic)   Adherent to CPAP with nocturnal oxygen . No issues with current setup. - Continue CPAP and oxygen  therapy as prescribed      Type 2 diabetes, controlled, with peripheral neuropathy (HCC) - Primary (Chronic)   Chronic. Currently managed with diet and exercise. Will repeat labs  today for evaluation.       Relevant Orders   Hemoglobin A1c (Completed)   CBC with Differential/Platelet (Completed)   Comprehensive metabolic panel with GFR (Completed)   Iron, TIBC and Ferritin Panel (Completed)   CKD (chronic kidney disease) stage 4, GFR 15-29 ml/min (HCC) (Chronic)   Repeat labs today for monitoring. Making urine.       Malignant neoplasm of rectum (HCC)   No alarm symptoms at this time. Follow closely with GI.       DCM (dilated cardiomyopathy) (HCC)   Currently managed closely with cardiology. No alarm symptoms present at this time. Continue current medications and close monitoring. Report any changes immediately.       Chronic combined systolic (congestive) and diastolic (congestive) heart failure (HCC)   CHF with occasional dyspnea. Managed with weekly torsemide . No recent hospitalizations or exacerbations. Crackles on lung exam, likely due to fluid retention. No cough or fever. - Continue torsemide  as prescribed - Monitor for signs of fluid retention and report changes - Monitor respiratory status and report worsening symptoms - Consider adjusting diuretic therapy if symptoms persist      PAH (pulmonary artery hypertension) (HCC)   No alarm symptoms. Appears at baseline. COntinue to monitor closely.       PAT (paroxysmal atrial tachycardia) (HCC)   HRRR today with no concerning findings.       Claudication of both lower extremities (HCC)   Chronic with weakness present. Monitor closely. Pedal pulses intact.       Chronic respiratory failure with hypoxia (HCC)   COPD with occasional dyspnea and lung crackles. No current inhaler use; previously used nebulizer post-CHF hospitalization. No recent exacerbations. - Monitor respiratory symptoms and report worsening - Consider re-evaluation of inhaler use if symptoms persist       Constipation by delayed colonic transit   Chronic constipation with bowel movements every one to two weeks. Some  improvement with current treatment, but not fully resolved. Inadequate water intake may contribute. - Increase Linzess  dosage to two capsules - Encourage increased water intake to at least four glasses per day      Relevant Orders   VITAMIN D  25 Hydroxy (Vit-D Deficiency, Fractures) (Completed)   Vitamin D  deficiency   Other Visit Diagnoses       Pulmonary hypertension, unspecified (HCC)         Iron deficiency       Relevant Orders   Iron, TIBC and Ferritin Panel (Completed)       Return in about 6 months (around 05/09/2024) for CPE.  [name], DNP, AGNP-c

## 2023-11-08 NOTE — Patient Instructions (Signed)
 Increase your Linzess  to 2 tablets a day and increase your water intake to at least 4 glasses of water a day to help with your bowel function.

## 2023-11-09 LAB — COMPREHENSIVE METABOLIC PANEL WITH GFR
ALT: 5 IU/L (ref 0–32)
AST: 14 IU/L (ref 0–40)
Albumin: 4.6 g/dL (ref 3.7–4.7)
Alkaline Phosphatase: 142 IU/L — ABNORMAL HIGH (ref 44–121)
BUN/Creatinine Ratio: 22 (ref 12–28)
BUN: 40 mg/dL — ABNORMAL HIGH (ref 8–27)
Bilirubin Total: 0.5 mg/dL (ref 0.0–1.2)
CO2: 23 mmol/L (ref 20–29)
Calcium: 9.1 mg/dL (ref 8.7–10.3)
Chloride: 106 mmol/L (ref 96–106)
Creatinine, Ser: 1.81 mg/dL — ABNORMAL HIGH (ref 0.57–1.00)
Globulin, Total: 2.9 g/dL (ref 1.5–4.5)
Glucose: 116 mg/dL — ABNORMAL HIGH (ref 70–99)
Potassium: 4.4 mmol/L (ref 3.5–5.2)
Sodium: 144 mmol/L (ref 134–144)
Total Protein: 7.5 g/dL (ref 6.0–8.5)
eGFR: 27 mL/min/{1.73_m2} — ABNORMAL LOW (ref >59)

## 2023-11-09 LAB — CBC WITH DIFFERENTIAL/PLATELET
Basophils Absolute: 0 10*3/uL (ref 0.0–0.2)
Basos: 1 %
EOS (ABSOLUTE): 0.3 10*3/uL (ref 0.0–0.4)
Eos: 6 %
Hematocrit: 35.3 % (ref 34.0–46.6)
Hemoglobin: 11.1 g/dL (ref 11.1–15.9)
Immature Grans (Abs): 0 10*3/uL (ref 0.0–0.1)
Immature Granulocytes: 0 %
Lymphocytes Absolute: 0.9 10*3/uL (ref 0.7–3.1)
Lymphs: 22 %
MCH: 27.5 pg (ref 26.6–33.0)
MCHC: 31.4 g/dL — ABNORMAL LOW (ref 31.5–35.7)
MCV: 87 fL (ref 79–97)
Monocytes Absolute: 0.4 10*3/uL (ref 0.1–0.9)
Monocytes: 9 %
Neutrophils Absolute: 2.6 10*3/uL (ref 1.4–7.0)
Neutrophils: 62 %
Platelets: 224 10*3/uL (ref 150–450)
RBC: 4.04 x10E6/uL (ref 3.77–5.28)
RDW: 13.5 % (ref 11.7–15.4)
WBC: 4.2 10*3/uL (ref 3.4–10.8)

## 2023-11-09 LAB — IRON,TIBC AND FERRITIN PANEL
Ferritin: 378 ng/mL — ABNORMAL HIGH (ref 15–150)
Iron Saturation: 21 % (ref 15–55)
Iron: 52 ug/dL (ref 27–139)
Total Iron Binding Capacity: 247 ug/dL — ABNORMAL LOW (ref 250–450)
UIBC: 195 ug/dL (ref 118–369)

## 2023-11-09 LAB — VITAMIN D 25 HYDROXY (VIT D DEFICIENCY, FRACTURES): Vit D, 25-Hydroxy: 25.4 ng/mL — ABNORMAL LOW (ref 30.0–100.0)

## 2023-11-09 LAB — HEMOGLOBIN A1C
Est. average glucose Bld gHb Est-mCnc: 126 mg/dL
Hgb A1c MFr Bld: 6 % — ABNORMAL HIGH (ref 4.8–5.6)

## 2023-11-15 ENCOUNTER — Other Ambulatory Visit: Payer: Self-pay | Admitting: Medical

## 2023-11-15 ENCOUNTER — Encounter: Payer: Self-pay | Admitting: Nurse Practitioner

## 2023-11-15 NOTE — Assessment & Plan Note (Signed)
 BP well controlled at this time. Goal BP less than 140/85. No changes to plan of care.

## 2023-11-15 NOTE — Assessment & Plan Note (Signed)
 Adherent to CPAP with nocturnal oxygen . No issues with current setup. - Continue CPAP and oxygen  therapy as prescribed

## 2023-11-15 NOTE — Assessment & Plan Note (Signed)
 HRRR today with no concerning findings.

## 2023-11-15 NOTE — Assessment & Plan Note (Signed)
 COPD with occasional dyspnea and lung crackles. No current inhaler use; previously used nebulizer post-CHF hospitalization. No recent exacerbations. - Monitor respiratory symptoms and report worsening - Consider re-evaluation of inhaler use if symptoms persist

## 2023-11-15 NOTE — Assessment & Plan Note (Signed)
 Currently managed closely with cardiology. No alarm symptoms present at this time. Continue current medications and close monitoring. Report any changes immediately.

## 2023-11-15 NOTE — Assessment & Plan Note (Signed)
 Chronic with weakness present. Monitor closely. Pedal pulses intact.

## 2023-11-15 NOTE — Assessment & Plan Note (Signed)
 No alarm symptoms. Appears at baseline. COntinue to monitor closely.

## 2023-11-15 NOTE — Assessment & Plan Note (Signed)
 No alarm symptoms at this time. Follow closely with GI.

## 2023-11-15 NOTE — Assessment & Plan Note (Signed)
 CHF with occasional dyspnea. Managed with weekly torsemide . No recent hospitalizations or exacerbations. Crackles on lung exam, likely due to fluid retention. No cough or fever. - Continue torsemide  as prescribed - Monitor for signs of fluid retention and report changes - Monitor respiratory status and report worsening symptoms - Consider adjusting diuretic therapy if symptoms persist

## 2023-11-15 NOTE — Assessment & Plan Note (Signed)
 Repeat labs today for monitoring. Making urine.

## 2023-11-15 NOTE — Assessment & Plan Note (Signed)
 Chronic. Currently managed with diet and exercise. Will repeat labs today for evaluation.

## 2023-11-15 NOTE — Assessment & Plan Note (Signed)
 Chronic constipation with bowel movements every one to two weeks. Some improvement with current treatment, but not fully resolved. Inadequate water intake may contribute. - Increase Linzess  dosage to two capsules - Encourage increased water intake to at least four glasses per day

## 2023-11-16 ENCOUNTER — Ambulatory Visit (INDEPENDENT_AMBULATORY_CARE_PROVIDER_SITE_OTHER): Payer: Medicare PPO

## 2023-11-16 DIAGNOSIS — I495 Sick sinus syndrome: Secondary | ICD-10-CM

## 2023-11-16 LAB — CUP PACEART REMOTE DEVICE CHECK
Battery Remaining Longevity: 152 mo
Battery Voltage: 3.15 V
Brady Statistic AP VP Percent: 0.42 %
Brady Statistic AP VS Percent: 86.15 %
Brady Statistic AS VP Percent: 0.02 %
Brady Statistic AS VS Percent: 13.4 %
Brady Statistic RA Percent Paced: 86.28 %
Brady Statistic RV Percent Paced: 0.45 %
Date Time Interrogation Session: 20250429024148
Implantable Lead Connection Status: 753985
Implantable Lead Connection Status: 753985
Implantable Lead Implant Date: 20070309
Implantable Lead Implant Date: 20070309
Implantable Lead Location: 753859
Implantable Lead Location: 753860
Implantable Lead Model: 4076
Implantable Lead Model: 6947
Implantable Pulse Generator Implant Date: 20240910
Lead Channel Impedance Value: 304 Ohm
Lead Channel Impedance Value: 323 Ohm
Lead Channel Impedance Value: 361 Ohm
Lead Channel Impedance Value: 380 Ohm
Lead Channel Pacing Threshold Amplitude: 0.75 V
Lead Channel Pacing Threshold Amplitude: 0.875 V
Lead Channel Pacing Threshold Pulse Width: 0.4 ms
Lead Channel Pacing Threshold Pulse Width: 0.4 ms
Lead Channel Sensing Intrinsic Amplitude: 0.5 mV
Lead Channel Sensing Intrinsic Amplitude: 0.5 mV
Lead Channel Sensing Intrinsic Amplitude: 3.625 mV
Lead Channel Sensing Intrinsic Amplitude: 3.625 mV
Lead Channel Setting Pacing Amplitude: 1.75 V
Lead Channel Setting Pacing Amplitude: 2 V
Lead Channel Setting Pacing Pulse Width: 0.4 ms
Lead Channel Setting Sensing Sensitivity: 1.2 mV
Zone Setting Status: 755011
Zone Setting Status: 755011

## 2023-11-20 ENCOUNTER — Encounter: Payer: Self-pay | Admitting: Cardiovascular Disease

## 2023-11-25 DIAGNOSIS — I130 Hypertensive heart and chronic kidney disease with heart failure and stage 1 through stage 4 chronic kidney disease, or unspecified chronic kidney disease: Secondary | ICD-10-CM | POA: Diagnosis not present

## 2023-11-25 DIAGNOSIS — N2581 Secondary hyperparathyroidism of renal origin: Secondary | ICD-10-CM | POA: Diagnosis not present

## 2023-11-25 DIAGNOSIS — R609 Edema, unspecified: Secondary | ICD-10-CM | POA: Diagnosis not present

## 2023-11-25 DIAGNOSIS — N184 Chronic kidney disease, stage 4 (severe): Secondary | ICD-10-CM | POA: Diagnosis not present

## 2023-11-25 DIAGNOSIS — I509 Heart failure, unspecified: Secondary | ICD-10-CM | POA: Diagnosis not present

## 2023-11-25 DIAGNOSIS — D631 Anemia in chronic kidney disease: Secondary | ICD-10-CM | POA: Diagnosis not present

## 2023-12-21 ENCOUNTER — Other Ambulatory Visit: Payer: Self-pay

## 2023-12-21 ENCOUNTER — Other Ambulatory Visit: Payer: Self-pay | Admitting: Cardiovascular Disease

## 2023-12-21 MED ORDER — POTASSIUM CHLORIDE ER 10 MEQ PO TBCR: 20.0000 meq | EXTENDED_RELEASE_TABLET | Freq: Every day | ORAL | 1 refills | Status: DC | PRN

## 2023-12-31 NOTE — Progress Notes (Signed)
 Remote pacemaker transmission.

## 2024-01-04 ENCOUNTER — Ambulatory Visit: Payer: Self-pay

## 2024-01-04 ENCOUNTER — Ambulatory Visit: Admitting: Family Medicine

## 2024-01-04 NOTE — Progress Notes (Signed)
 Chief Complaint  Patient presents with   Fall    Patient fell while getting on  bedside commode (lifted toilet seat) it flipped over, last Monday June 9th. Pain is left sided-lower back and hip area. Patient rates her pain 7 out of 10.    Patient presents complaining of pain in the lower left side of her back and down the thigh, lateral hip/buttock. Pain started after a fall on Mon 6/9.  Aaron Aas She sat on the edge of the bedside commode, which flipped and she fell. She fell onto her left side. Light was off and she was talking on the phone at the time.  Pain got worse a couple of days later (Wed 6/11). She has taken tylenol , which helps some, reduces pain from 10 to 7/10.  Denies bruising. Hurts to walk, to lay on the left side.   PMH, PSH, SH reviewed CHF, CKD, COPD (on O2 at bedtime with CPAP), CAD, dilated cardiomyopathy, had ICD, changed to pacemaker when end of life. H/o rectal cancer, HLD, OSA, PAH DM with neuropathy, claudication, chronic constipation  Lab Results  Component Value Date   HGBA1C 6.0 (H) 11/08/2023    [id number] numbness or tingling (no different than chronic neuropathy). Denies dizziness or headaches. No f/c, URI symptoms, CP, shortness of breath.   PHYSICAL EXAM:  BP 124/70   Pulse (!) 52   Ht 5' 3 (1.6 m)   Wt 173 lb (78.5 kg)   BMI 30.65 kg/m   [name], accompanied by her daughter [name]. Neck: no lymphadenopathy or mass Heart: regular rate and rhythm Lungs: clear bilaterally Back: no spinal or CVA tenderness.  Discomfort at left lumbar paraspinous muscles, no significant spasm No SI tenderness Abdomen: soft, nontender Extremities: no edema.  Tender at L greater trochanter, and with IT band stretch. No bruising or swelling noted. FROM of hips, no pain with piriformis stretch. Neuro: alert and oriented, normal sensation, slow gait    ASSESSMENT/PLAN:  Acute left-sided low back pain without sciatica - suspect muscular. given  fall. check x-ray.  Heat, stretch, massage, topical meds (lidocaine , voltaren), tylenol . No NSAIDs - Plan: DG Lumbar Spine Complete  Left hip pain - tender over L greater trochanter.  Suspect contusion vs bursitis.  Check XR given fall.  Topical voltaren, lidocaine , heat vs ice, tylenol  prn - Plan: DG HIP UNILAT W OR W/O PELVIS 2-3 VIEWS LEFT  History of fall - reviewed measures to prevent future falls (turn on light, put down phone) - Plan: DG Lumbar Spine Complete, DG HIP UNILAT W OR W/O PELVIS 2-3 VIEWS LEFT  CKD (chronic kidney disease) stage 4, GFR 15-29 ml/min (HCC)   Use heat to your back and hip as needed. Do gentle stretch and massages to the low back as we discussed.  Use voltaren gel topically to the left hip and the back, as needed. You can also use lidocaine  patches (SalonPas with lidocaine )  Go to [facility] at [facility] for x-rays. If your x-rays are normal, and your pain isn't improving, let us  know.  PT may be the next step (vs possible injection if there is a persistent pain at the hip bursa).  Call patient with results, [name] if can't reach.

## 2024-01-04 NOTE — Telephone Encounter (Signed)
 FYI Only or Action Required?: FYI only for provider  Patient was last seen in primary care on 11/08/2023 by Early, Adriane Albe, NP. Called Nurse Triage reporting Pain. Symptoms began a week ago. Interventions attempted: OTC medications: tylenol . Symptoms are: unchanged.  Triage Disposition: See PCP When Office is Open (Within 3 Days)  Patient/caregiver understands and will follow disposition?: Yes              Copied from CRM [id number]. Topic: Clinical - Red Word Triage >> Jan 04, 2024 10:45 AM Oddis Bench wrote: Red Word that prompted transfer to Nurse Triage:  Daughter is calling about a week ago patient fell and she is having pain on her  lower left side and buttock's Reason for Disposition  [1] MODERATE back pain (e.g., interferes with normal activities) AND [2] present > 3 days  Answer Assessment - Initial Assessment Questions 1. ONSET: When did the pain begin?       A week ago 2. LOCATION: Where does it hurt? (upper, mid or lower back)     Lower back 3. SEVERITY: How bad is the pain?  (e.g., Scale 1-10; mild, moderate, or severe)   - MILD (1-3): Doesn't interfere with normal activities.    - MODERATE (4-7): Interferes with normal activities or awakens from sleep.    - SEVERE (8-10): Excruciating pain, unable to do any normal activities.      Mod-severe 4. PATTERN: Is the pain constant? (e.g., yes, no; constant, intermittent)      constant 5. RADIATION: Does the pain shoot into your legs or somewhere else?     buttocks 6. CAUSE:  What do you think is causing the back pain?      Marvell Slider a week ago 7. BACK OVERUSE:  Any recent lifting of heavy objects, strenuous work or exercise?     no 8. MEDICINES: What have you taken so far for the pain? (e.g., nothing, acetaminophen , NSAIDS)     tylenol  9. NEUROLOGIC SYMPTOMS: Do you have any weakness, numbness, or problems with bowel/bladder control?     no 10. OTHER SYMPTOMS: Do you have any other symptoms? (e.g.,  fever, abdomen pain, burning with urination, blood in urine)       no  Protocols used: Back Pain-A-AH

## 2024-01-05 ENCOUNTER — Ambulatory Visit
Admission: RE | Admit: 2024-01-05 | Discharge: 2024-01-05 | Disposition: A | Discharge status: Home / Self Care | Source: Ambulatory Visit | Attending: Family Medicine | Admitting: Family Medicine

## 2024-01-05 ENCOUNTER — Encounter: Payer: Self-pay | Admitting: Family Medicine

## 2024-01-05 ENCOUNTER — Ambulatory Visit: Admitting: Family Medicine

## 2024-01-05 ENCOUNTER — Ambulatory Visit: Payer: Self-pay | Admitting: Family Medicine

## 2024-01-05 ENCOUNTER — Ambulatory Visit: Payer: Medicare PPO | Admitting: Podiatry

## 2024-01-05 ENCOUNTER — Encounter: Payer: Self-pay | Admitting: Podiatry

## 2024-01-05 VITALS — BP 124/70 | HR 52 | Ht 63.0 in | Wt 173.0 lb

## 2024-01-05 DIAGNOSIS — M25552 Pain in left hip: Secondary | ICD-10-CM | POA: Diagnosis not present

## 2024-01-05 DIAGNOSIS — M545 Low back pain, unspecified: Secondary | ICD-10-CM

## 2024-01-05 DIAGNOSIS — N184 Chronic kidney disease, stage 4 (severe): Secondary | ICD-10-CM | POA: Diagnosis not present

## 2024-01-05 DIAGNOSIS — M1612 Unilateral primary osteoarthritis, left hip: Secondary | ICD-10-CM | POA: Diagnosis not present

## 2024-01-05 DIAGNOSIS — M79675 Pain in left toe(s): Secondary | ICD-10-CM | POA: Diagnosis not present

## 2024-01-05 DIAGNOSIS — M79674 Pain in right toe(s): Secondary | ICD-10-CM | POA: Diagnosis not present

## 2024-01-05 DIAGNOSIS — Z9181 History of falling: Secondary | ICD-10-CM

## 2024-01-05 DIAGNOSIS — E1142 Type 2 diabetes mellitus with diabetic polyneuropathy: Secondary | ICD-10-CM | POA: Diagnosis not present

## 2024-01-05 DIAGNOSIS — L84 Corns and callosities: Secondary | ICD-10-CM | POA: Diagnosis not present

## 2024-01-05 DIAGNOSIS — B351 Tinea unguium: Secondary | ICD-10-CM | POA: Diagnosis not present

## 2024-01-05 NOTE — Patient Instructions (Signed)
 Use heat to your back and hip as needed. Do gentle stretch and massages to the low back as we discussed.  Use voltaren gel topically to the left hip and the back, as needed. You can also use lidocaine  patches (SalonPas with lidocaine )  Go to [facility] at [facility] for x-rays. If your x-rays are normal, and your pain isn't improving, let us  know.  PT may be the next step (vs possible injection if there is a persistent pain at the hip bursa).

## 2024-01-11 ENCOUNTER — Encounter: Payer: Self-pay | Admitting: Podiatry

## 2024-01-11 NOTE — Progress Notes (Signed)
 Subjective:  Patient ID: [name], female    DOB: 1941/04/03,  MRN: [id number]  [name] presents to clinic today for at risk foot care with history of diabetic neuropathy and callus(es) of both feet and painful mycotic toenails that are difficult to trim. Painful toenails interfere with ambulation. Aggravating factors include wearing enclosed shoe gear. Pain is relieved with periodic professional debridement. Painful calluses are aggravated when weightbearing with and without shoegear. Pain is relieved with periodic professional debridement.  Chief Complaint  Patient presents with   [facility]    Rm16 DFC/ Dr. Oris last visit April 2025/ Blood sugar 110/ A1c 6   New problem(s): None.   PCP is Early, Camie BRAVO, NP.  Allergies  Allergen Reactions   Benazepril Hcl Other (See Comments)    Unknown   Lotrel [Amlodipine  Besy-Benazepril Hcl] Other (See Comments)    Hypotension    Talwin [Pentazocine] Other (See Comments)    Hallucinations      Review of Systems: Negative except as noted in the HPI.  Objective: No changes noted in today's physical examination. There were no vitals filed for this visit. [name] is a pleasant 83 y.o. female WD, WN in NAD. AAO x 3.  Vascular Examination: Capillary refill time immediate b/l. Palpable pedal pulses. No pain with calf compression b/l. Skin temperature gradient WNL b/l. No cyanosis or clubbing b/l. No ischemia or gangrene noted b/l. Pedal hair absent.  Neurological Examination: Protective sensation decreased with 10 gram monofilament b/l.  Dermatological Examination: Pedal skin with normal turgor, texture and tone b/l.  No open wounds. No interdigital macerations.   Toenails 1-5 b/l thick, discolored, elongated with subungual debris and pain on dorsal palpation.   Pedal skin thin and atrophic b/l LE. No open wounds b/l LE. Toenails 1-5 b/l elongated, discolored, dystrophic, thickened, crumbly with subungual debris and tenderness  to dorsal palpation. Hyperkeratotic lesion(s) submet head 1 b/l and submet head 5 b/l.  No erythema, no edema, no drainage, no fluctuance.  Musculoskeletal Examination: Muscle strength 5/5 to all lower extremity muscle groups bilaterally. HAV with bunion deformity noted b/l LE. Hammertoe(s) 2-5 b/l.[name] No pain, crepitus or joint limitation noted with ROM b/l LE.  Patient ambulates independently without assistive aids.  Radiographs: None  Last A1c:      Latest Ref Rng & Units 11/08/2023    2:38 PM 05/06/2023    2:39 PM  Hemoglobin A1C  Hemoglobin-A1c 4.8 - 5.6 % 6.0  6.2    Assessment/Plan: 1. Pain due to onychomycosis of toenails of both feet   2. Callus   3. Diabetic peripheral neuropathy associated with type 2 diabetes mellitus (HCC)    Patient was evaluated and treated. All patient's and/or POA's questions/concerns addressed on today's visit. Toenails 1-5 debrided in length and girth without incident. Callus(es) submet head 1 b/l and submet head 5 b/l pared with sharp debridement without incident. Continue daily foot inspections and monitor blood glucose per PCP/Endocrinologist's recommendations. Continue soft, supportive shoe gear daily. Report any pedal injuries to medical professional. Call office if there are any questions/concerns. Return in about 3 months (around 04/06/2024).  [name] Merlin, DPM      [facility] LOCATION: [address]  [place], [place] [address]                   Office [phone]   [facility] LOCATION: [address] [place], [place] [address] Office [phone]

## 2024-01-12 ENCOUNTER — Ambulatory Visit: Payer: Medicare PPO | Admitting: Podiatry

## 2024-01-17 DIAGNOSIS — J449 Chronic obstructive pulmonary disease, unspecified: Secondary | ICD-10-CM | POA: Diagnosis not present

## 2024-01-17 DIAGNOSIS — G4733 Obstructive sleep apnea (adult) (pediatric): Secondary | ICD-10-CM | POA: Diagnosis not present

## 2024-01-17 DIAGNOSIS — I10 Essential (primary) hypertension: Secondary | ICD-10-CM | POA: Diagnosis not present

## 2024-02-15 ENCOUNTER — Ambulatory Visit: Payer: Medicare PPO

## 2024-02-15 DIAGNOSIS — I495 Sick sinus syndrome: Secondary | ICD-10-CM | POA: Diagnosis not present

## 2024-02-16 LAB — CUP PACEART REMOTE DEVICE CHECK
Battery Remaining Longevity: 152 mo
Battery Voltage: 3.11 V
Brady Statistic AP VP Percent: 0.3 %
Brady Statistic AP VS Percent: 86.3 %
Brady Statistic AS VP Percent: 0.02 %
Brady Statistic AS VS Percent: 13.38 %
Brady Statistic RA Percent Paced: 86.62 %
Brady Statistic RV Percent Paced: 0.32 %
Date Time Interrogation Session: 20250729005110
Implantable Lead Connection Status: 753985
Implantable Lead Connection Status: 753985
Implantable Lead Implant Date: 20070309
Implantable Lead Implant Date: 20070309
Implantable Lead Location: 753859
Implantable Lead Location: 753860
Implantable Lead Model: 4076
Implantable Lead Model: 6947
Implantable Pulse Generator Implant Date: 20240910
Lead Channel Impedance Value: 266 Ohm
Lead Channel Impedance Value: 285 Ohm
Lead Channel Impedance Value: 342 Ohm
Lead Channel Impedance Value: 342 Ohm
Lead Channel Pacing Threshold Amplitude: 0.75 V
Lead Channel Pacing Threshold Amplitude: 0.75 V
Lead Channel Pacing Threshold Pulse Width: 0.4 ms
Lead Channel Pacing Threshold Pulse Width: 0.4 ms
Lead Channel Sensing Intrinsic Amplitude: 0.375 mV
Lead Channel Sensing Intrinsic Amplitude: 0.375 mV
Lead Channel Sensing Intrinsic Amplitude: 3.25 mV
Lead Channel Sensing Intrinsic Amplitude: 3.25 mV
Lead Channel Setting Pacing Amplitude: 1.5 V
Lead Channel Setting Pacing Amplitude: 2 V
Lead Channel Setting Pacing Pulse Width: 0.4 ms
Lead Channel Setting Sensing Sensitivity: 1.2 mV
Zone Setting Status: 755011
Zone Setting Status: 755011

## 2024-02-22 ENCOUNTER — Ambulatory Visit: Payer: Self-pay | Admitting: Cardiovascular Disease

## 2024-03-30 ENCOUNTER — Encounter: Admitting: Cardiovascular Disease

## 2024-04-07 ENCOUNTER — Ambulatory Visit (HOSPITAL_BASED_OUTPATIENT_CLINIC_OR_DEPARTMENT_OTHER): Admitting: Pulmonary Disease

## 2024-04-07 ENCOUNTER — Encounter (HOSPITAL_BASED_OUTPATIENT_CLINIC_OR_DEPARTMENT_OTHER): Payer: Self-pay | Admitting: Pulmonary Disease

## 2024-04-07 VITALS — BP 119/72 | HR 74 | Ht 63.0 in | Wt 157.0 lb

## 2024-04-07 DIAGNOSIS — Z23 Encounter for immunization: Secondary | ICD-10-CM | POA: Diagnosis not present

## 2024-04-07 DIAGNOSIS — I272 Pulmonary hypertension, unspecified: Secondary | ICD-10-CM | POA: Diagnosis not present

## 2024-04-07 DIAGNOSIS — G4733 Obstructive sleep apnea (adult) (pediatric): Secondary | ICD-10-CM | POA: Diagnosis not present

## 2024-04-07 DIAGNOSIS — J9611 Chronic respiratory failure with hypoxia: Secondary | ICD-10-CM | POA: Diagnosis not present

## 2024-04-07 NOTE — Patient Instructions (Addendum)
 X FLu shot  X ambulatory sat    VISIT SUMMARY: You came in today for a follow-up visit regarding your obstructive sleep apnea and severe pulmonary hypertension. We reviewed your current treatments and discussed your progress and any necessary adjustments.  YOUR PLAN: -OBSTRUCTIVE SLEEP APNEA: Obstructive sleep apnea is a condition where your breathing stops and starts repeatedly during sleep. Your CPAP machine settings are currently effective, and you should continue using it at the settings of 12 to 20 cm H2O. We will renew your CPAP supplies, and if needed in the future, we may consider increasing the settings to 15 to 20 cm H2O.  - PULMONARY HYPERTENSION : Severe pulmonary hypertension is high blood pressure in the lungs' arteries, often due to chronic respiratory failure with low oxygen  levels. You should continue using 2 liters of oxygen  with your CPAP machine. No additional interventions are needed at this time, but we will consider further actions if your symptoms worsen.  INSTRUCTIONS: Please continue using your CPAP machine with the current settings and ensure you have your CPAP supplies renewed. Keep using 2 liters of oxygen  with your CPAP machine. If you notice any worsening of your symptoms, please contact us  immediately.                      Contains text generated by Abridge.                                 Contains text generated by Abridge.

## 2024-04-07 NOTE — Progress Notes (Signed)
 Subjective:    Patient ID: [name], female    DOB: 1941-02-11, 83 y.o.   MRN: [id number]  83 yo woman for FU of OSA, chronic respiratory failure and pulmonary hypertension.  Presents to establish care , previous pt of dr sood -did not  tolerate PDE 5 inhibitors.  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  BL cerebellar infarct      Discussed the use of AI scribe software for clinical note transcription with the patient, who gave verbal consent to proceed.  History of Present Illness [name] is an 83 year old female with obstructive sleep apnea and severe pulmonary hypertension who presents for follow-up care. She was previously under the care of Dr. Claiborne for her CPAP management.  She uses a CPAP machine nightly with settings from 12 to 20 cm H2O, achieving a residual apnea-hypopnea index of 7 to 8 events per hour, improved from 16 events per hour. She experiences no issues with the current CPAP settings.  Severe pulmonary hypertension was previously managed with sildenafil , which was discontinued due to hypotension. She currently uses two liters of oxygen  with her CPAP and has portable oxygen  for outside use, though it is infrequently used.  She has a pacemaker and improved heart function, remaining active by walking until winded, then resting. No peripheral edema is present, and she uses a diuretic as needed, typically at the week's start.      Significant tests/ events reviewed  PFT 06/28/07 >> FEV1 0.73 (39%), FEV1% 92, TLC 2.41 (51%) PFT 05/04/17 >> FEV1 1.22 (79%), FEV1% 69, TLC 3.12 (75%), DLCO 39%, +BD PFT 08/26/20 >> FEV1 1.27 (95%), FEV1% 80, TLC 3.48 (73%), DLCO 60%  PSG 10/02/13 >> AHI 59.6, SpO2 low 80% PSG  07/05/14 >> AHI 15.1, SpO2 low 76% PSG 04/05/20 >> AHI 6.4, SpO2 low 83%, needed 2 liters  Echo 10/16/20 >> EF 55 to 60%, grade 1 DD, RVSP 72.6 mmHg, mod LA/RA dilation  10/2020 RHC  63/14 mean PAP 33, LVEDP nml, CI 2.6  HRCT chest 02/15/20 >> small b/l effusions, cardiomegaly, atherosclerosis, enlarged pulmonary artery 3.6 cm   Review of Systems  neg for any significant sore throat, dysphagia, itching, sneezing, nasal congestion or excess/ purulent secretions, fever, chills, sweats, unintended wt loss, pleuritic or exertional cp, hempoptysis, orthopnea pnd or change in chronic leg swelling. Also denies presyncope, palpitations, heartburn, abdominal pain, nausea, vomiting, diarrhea or change in bowel or urinary habits, dysuria,hematuria, rash, arthralgias, visual complaints, headache, numbness weakness or ataxia.      Objective:   Physical Exam  Gen. Pleasant, well-nourished, in no distress ENT - no thrush, no pallor/icterus,no post nasal drip Neck: No JVD, no thyromegaly, no carotid bruits Lungs: no use of accessory muscles, no dullness to percussion, BB dry rales no rhonchi  Cardiovascular: Rhythm regular, heart sounds  normal, no murmurs or gallops, no peripheral edema Musculoskeletal: No deformities, no cyanosis or clubbing        Assessment & Plan:   Assessment and Plan Assessment & Plan Obstructive sleep apnea Obstructive sleep apnea is managed with a CPAP machine set to auto settings of 12 to 20 cm H2O, averaging 20 cm H2O. The residual hypopnea index is 8 events per  hour, improved from 16 events per hour. Current settings are effective, and there is no need to switch to BiPAP. - Renew CPAP supplies - Continue current CPAP settings of 12 to 20 cm H2O - Consider increasing CPAP settings to 15 to 20 cm H2O if necessary in the future  Severe pulmonary hypertension WHO 2/3 with  chronic type 2 respiratory failure with hypoxia  She is on 2 liters of oxygen  blended into her CPAP  machine. Sildenafil  was previously tried but not tolerated due to hypotension. She remains active and manages symptoms by resting when winded. No additional interventions are needed currently. - Continue 2 liters of oxygen  blended into CPAP - Consider further interventions if symptoms worsen

## 2024-04-07 NOTE — Addendum Note (Signed)
 Addended by: Orbin Mayeux O on: 04/07/2024 01:50 PM   Modules accepted: Orders

## 2024-04-11 ENCOUNTER — Ambulatory Visit: Admitting: Podiatry

## 2024-04-11 ENCOUNTER — Encounter: Payer: Self-pay | Admitting: Podiatry

## 2024-04-11 VITALS — Ht 63.0 in | Wt 157.0 lb

## 2024-04-11 DIAGNOSIS — M79675 Pain in left toe(s): Secondary | ICD-10-CM | POA: Diagnosis not present

## 2024-04-11 DIAGNOSIS — E1142 Type 2 diabetes mellitus with diabetic polyneuropathy: Secondary | ICD-10-CM | POA: Diagnosis not present

## 2024-04-11 DIAGNOSIS — B351 Tinea unguium: Secondary | ICD-10-CM

## 2024-04-11 DIAGNOSIS — M79674 Pain in right toe(s): Secondary | ICD-10-CM

## 2024-04-11 DIAGNOSIS — L84 Corns and callosities: Secondary | ICD-10-CM

## 2024-04-12 ENCOUNTER — Other Ambulatory Visit: Payer: Self-pay | Admitting: Nurse Practitioner

## 2024-04-12 NOTE — Progress Notes (Signed)
 Subjective:  Patient ID: [name], female    DOB: September 12, 1940,  MRN: [id number]  [name] presents to clinic today for: at risk foot care with history of diabetic neuropathy and callus(es) b/l lower extremities and painful mycotic toenails that are difficult to trim. Painful toenails interfere with ambulation. Aggravating factors include wearing enclosed shoe gear. Pain is relieved with periodic professional debridement. Painful calluses are aggravated when weightbearing with and without shoegear. Pain is relieved with periodic professional debridement.  Chief Complaint  Patient presents with   Nail Problem    RM 15 RFC. Pt is diabetic (A1C 6.0) PCP- Alonso Doing, NP, last visit 10/2023.    PCP is Early, Camie BRAVO, NP.  Allergies  Allergen Reactions   Benazepril Hcl Other (See Comments)    Unknown   Lotrel [Amlodipine  Besy-Benazepril Hcl] Other (See Comments)    Hypotension    Talwin [Pentazocine] Other (See Comments)    Hallucinations      Review of Systems: Negative except as noted in the HPI.  Objective: No changes noted in today's physical examination. There were no vitals filed for this visit.  [name] is a pleasant 83 y.o. female WD, WN in NAD. AAO x 3.  Vascular Examination: Capillary refill time <3 seconds b/l LE. Palpable pedal pulses b/l LE. Digital hair absent b/l. No pedal edema b/l. Skin temperature gradient WNL b/l. No varicosities b/l. No cyanosis or clubbing. No ischemia or gangrene. .  Dermatological Examination: Pedal skin with normal turgor, texture and tone b/l. No open wounds. No interdigital macerations b/l. Toenails 1-5 b/l thickened, discolored, dystrophic with subungual debris. There is pain on palpation to dorsal aspect of nailplates. Hyperkeratotic lesion(s) submet head 1 b/l and submet head 5 b/l.  No erythema, no edema, no drainage, no fluctuance.  Neurological Examination: Protective sensation decreased with 10 gram monofilament  b/l.  Musculoskeletal Examination: Muscle strength 5/5 to all lower extremity muscle groups bilaterally. HAV with bunion bilaterally and hammertoes 2-5 b/l. No pain, crepitus or joint limitation noted with ROM b/l LE.  Patient ambulates independently without assistive aids.      Latest Ref Rng & Units 11/08/2023    2:38 PM 05/06/2023    2:39 PM  Hemoglobin A1C  Hemoglobin-A1c 4.8 - 5.6 % 6.0  6.2    Assessment/Plan: 1. Pain due to onychomycosis of toenails of both feet   2. Callus   3. Diabetic peripheral neuropathy associated with type 2 diabetes mellitus [facility])   Consent given for treatment. Patient examined. All patient's and/or POA's questions/concerns addressed on today's visit. Mycotic toenails 1-5 debrided in length and girth without incident. Callus(es) submet head 1 b/l and submet head 5 b/l pared with sharp debridement without incident. Continue foot and shoe inspections daily. Monitor blood glucose per PCP/Endocrinologist's recommendations.Continue soft, supportive shoe gear daily. Report any pedal injuries to medical professional. Call office if there are any quesitons/concerns.  Return in about 9 weeks (around 06/13/2024).  Delon LITTIE Merlin, DPM      [place] LOCATION: [address] [place] [address]                   Office [phone]   [place] LOCATION:  [address] [place], [place] [address] Office [phone]

## 2024-04-19 NOTE — Progress Notes (Signed)
 Remote PPM Transmission

## 2024-04-21 ENCOUNTER — Encounter: Payer: Self-pay | Admitting: Cardiovascular Disease

## 2024-04-21 ENCOUNTER — Ambulatory Visit: Attending: Cardiovascular Disease | Admitting: Cardiovascular Disease

## 2024-04-21 VITALS — BP 126/62 | HR 61 | Ht 63.0 in | Wt 175.0 lb

## 2024-04-21 DIAGNOSIS — I5042 Chronic combined systolic (congestive) and diastolic (congestive) heart failure: Secondary | ICD-10-CM

## 2024-04-21 DIAGNOSIS — I495 Sick sinus syndrome: Secondary | ICD-10-CM | POA: Diagnosis not present

## 2024-04-21 DIAGNOSIS — N184 Chronic kidney disease, stage 4 (severe): Secondary | ICD-10-CM | POA: Diagnosis not present

## 2024-04-21 DIAGNOSIS — Z8673 Personal history of transient ischemic attack (TIA), and cerebral infarction without residual deficits: Secondary | ICD-10-CM

## 2024-04-21 DIAGNOSIS — I10 Essential (primary) hypertension: Secondary | ICD-10-CM

## 2024-04-21 DIAGNOSIS — I2721 Secondary pulmonary arterial hypertension: Secondary | ICD-10-CM

## 2024-04-21 DIAGNOSIS — T82198D Other mechanical complication of other cardiac electronic device, subsequent encounter: Secondary | ICD-10-CM | POA: Diagnosis not present

## 2024-04-21 DIAGNOSIS — Z950 Presence of cardiac pacemaker: Secondary | ICD-10-CM

## 2024-04-21 DIAGNOSIS — R7303 Prediabetes: Secondary | ICD-10-CM

## 2024-04-21 DIAGNOSIS — I480 Paroxysmal atrial fibrillation: Secondary | ICD-10-CM | POA: Diagnosis not present

## 2024-04-21 DIAGNOSIS — E7800 Pure hypercholesterolemia, unspecified: Secondary | ICD-10-CM

## 2024-04-21 DIAGNOSIS — G4733 Obstructive sleep apnea (adult) (pediatric): Secondary | ICD-10-CM

## 2024-04-21 NOTE — Progress Notes (Signed)
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 called MyChart.  Sign up information is provided on this After Visit Summary.  MyChart is used to connect with patients for Virtual Visits (Telemedicine).  Patients are able to view lab/test results, encounter notes, upcoming appointments, etc.  Non-urgent messages can be sent to your provider as well.   To learn more about what you can do with MyChart, go to [email or web address].      Medication Adjustments/Labs and Tests Ordered: Patient Current medicines are reviewed at length with the patient today.  Concerns regarding medicines are outlined above.  Orders Placed This Encounter  Procedures   EKG 12-Lead   No orders of the defined types were placed in this encounter.   Signed, Jerel Balding, MD  04/22/2024 6:16 PM    [facility]'

## 2024-04-21 NOTE — Patient Instructions (Signed)
 Medication Instructions:  No changes *If you need a refill on your cardiac medications before your next appointment, please call your pharmacy*  Lab Work: None ordered If you have labs (blood work) drawn today and your tests are completely normal, you will receive your results only by: MyChart Message (if you have MyChart) OR A paper copy in the mail If you have any lab test that is abnormal or we need to change your treatment, we will call you to review the results.  Testing/Procedures: None ordered  Follow-Up: At [facility], you and your health needs are our priority.  As part of our continuing mission to provide you with exceptional heart care, our providers are all part of one team.  This team includes your primary Cardiologist (physician) and Advanced Practice Providers or APPs (Physician Assistants and Nurse Practitioners) who all work together to provide you with the care you need, when you need it.  Your next appointment:   6 month(s)  Provider:   Jerel Balding, MD    We recommend signing up for the patient portal called MyChart.  Sign up information is provided on this After Visit Summary.  MyChart is used to connect with patients for Virtual Visits (Telemedicine).  Patients are able to view lab/test results, encounter notes, upcoming appointments, etc.  Non-urgent messages can be sent to your provider as well.   To learn more about what you can do with MyChart, go to [email or web address].

## 2024-05-03 DIAGNOSIS — N184 Chronic kidney disease, stage 4 (severe): Secondary | ICD-10-CM | POA: Diagnosis not present

## 2024-05-03 DIAGNOSIS — I130 Hypertensive heart and chronic kidney disease with heart failure and stage 1 through stage 4 chronic kidney disease, or unspecified chronic kidney disease: Secondary | ICD-10-CM | POA: Diagnosis not present

## 2024-05-03 DIAGNOSIS — E785 Hyperlipidemia, unspecified: Secondary | ICD-10-CM | POA: Diagnosis not present

## 2024-05-03 DIAGNOSIS — I4891 Unspecified atrial fibrillation: Secondary | ICD-10-CM | POA: Diagnosis not present

## 2024-05-03 DIAGNOSIS — M1990 Unspecified osteoarthritis, unspecified site: Secondary | ICD-10-CM | POA: Diagnosis not present

## 2024-05-03 DIAGNOSIS — I509 Heart failure, unspecified: Secondary | ICD-10-CM | POA: Diagnosis not present

## 2024-05-03 DIAGNOSIS — I272 Pulmonary hypertension, unspecified: Secondary | ICD-10-CM | POA: Diagnosis not present

## 2024-05-03 DIAGNOSIS — J9610 Chronic respiratory failure, unspecified whether with hypoxia or hypercapnia: Secondary | ICD-10-CM | POA: Diagnosis not present

## 2024-05-03 DIAGNOSIS — J449 Chronic obstructive pulmonary disease, unspecified: Secondary | ICD-10-CM | POA: Diagnosis not present

## 2024-05-09 ENCOUNTER — Other Ambulatory Visit: Payer: Self-pay | Admitting: Nurse Practitioner

## 2024-05-16 ENCOUNTER — Ambulatory Visit (INDEPENDENT_AMBULATORY_CARE_PROVIDER_SITE_OTHER): Payer: Medicare PPO

## 2024-05-16 DIAGNOSIS — I480 Paroxysmal atrial fibrillation: Secondary | ICD-10-CM

## 2024-05-17 LAB — CUP PACEART REMOTE DEVICE CHECK
Battery Remaining Longevity: 147 mo
Battery Voltage: 3.06 V
Brady Statistic AP VP Percent: 19.85 %
Brady Statistic AP VS Percent: 63.89 %
Brady Statistic AS VP Percent: 1.68 %
Brady Statistic AS VS Percent: 14.58 %
Brady Statistic RA Percent Paced: 79.02 %
Brady Statistic RV Percent Paced: 22.34 %
Date Time Interrogation Session: 20251028034446
Implantable Lead Connection Status: 753985
Implantable Lead Connection Status: 753985
Implantable Lead Implant Date: 20070309
Implantable Lead Implant Date: 20070309
Implantable Lead Location: 753859
Implantable Lead Location: 753860
Implantable Lead Model: 4076
Implantable Lead Model: 6947
Implantable Pulse Generator Implant Date: 20240910
Lead Channel Impedance Value: 266 Ohm
Lead Channel Impedance Value: 266 Ohm
Lead Channel Impedance Value: 323 Ohm
Lead Channel Impedance Value: 361 Ohm
Lead Channel Pacing Threshold Amplitude: 0.75 V
Lead Channel Pacing Threshold Amplitude: 0.875 V
Lead Channel Pacing Threshold Pulse Width: 0.4 ms
Lead Channel Pacing Threshold Pulse Width: 0.4 ms
Lead Channel Sensing Intrinsic Amplitude: 0.125 mV
Lead Channel Sensing Intrinsic Amplitude: 0.125 mV
Lead Channel Sensing Intrinsic Amplitude: 4.625 mV
Lead Channel Sensing Intrinsic Amplitude: 4.625 mV
Lead Channel Setting Pacing Amplitude: 1.5 V
Lead Channel Setting Pacing Amplitude: 2 V
Lead Channel Setting Pacing Pulse Width: 0.4 ms
Lead Channel Setting Sensing Sensitivity: 1.2 mV
Zone Setting Status: 755011
Zone Setting Status: 755011

## 2024-05-18 ENCOUNTER — Ambulatory Visit: Admitting: Nurse Practitioner

## 2024-05-18 ENCOUNTER — Other Ambulatory Visit (HOSPITAL_COMMUNITY): Payer: Self-pay

## 2024-05-18 ENCOUNTER — Ambulatory Visit: Payer: Self-pay | Admitting: Cardiovascular Disease

## 2024-05-18 ENCOUNTER — Encounter: Payer: Self-pay | Admitting: Nurse Practitioner

## 2024-05-18 VITALS — BP 130/82 | HR 64 | Ht 61.0 in | Wt 176.2 lb

## 2024-05-18 DIAGNOSIS — Z95811 Presence of heart assist device: Secondary | ICD-10-CM

## 2024-05-18 DIAGNOSIS — I420 Dilated cardiomyopathy: Secondary | ICD-10-CM

## 2024-05-18 DIAGNOSIS — B360 Pityriasis versicolor: Secondary | ICD-10-CM

## 2024-05-18 DIAGNOSIS — I2721 Secondary pulmonary arterial hypertension: Secondary | ICD-10-CM | POA: Diagnosis not present

## 2024-05-18 DIAGNOSIS — I5042 Chronic combined systolic (congestive) and diastolic (congestive) heart failure: Secondary | ICD-10-CM | POA: Diagnosis not present

## 2024-05-18 DIAGNOSIS — K5901 Slow transit constipation: Secondary | ICD-10-CM

## 2024-05-18 DIAGNOSIS — J449 Chronic obstructive pulmonary disease, unspecified: Secondary | ICD-10-CM | POA: Diagnosis not present

## 2024-05-18 DIAGNOSIS — I739 Peripheral vascular disease, unspecified: Secondary | ICD-10-CM

## 2024-05-18 DIAGNOSIS — I10 Essential (primary) hypertension: Secondary | ICD-10-CM | POA: Diagnosis not present

## 2024-05-18 DIAGNOSIS — N184 Chronic kidney disease, stage 4 (severe): Secondary | ICD-10-CM

## 2024-05-18 DIAGNOSIS — E782 Mixed hyperlipidemia: Secondary | ICD-10-CM | POA: Diagnosis not present

## 2024-05-18 DIAGNOSIS — E1169 Type 2 diabetes mellitus with other specified complication: Secondary | ICD-10-CM

## 2024-05-18 DIAGNOSIS — Z6833 Body mass index (BMI) 33.0-33.9, adult: Secondary | ICD-10-CM

## 2024-05-18 DIAGNOSIS — I428 Other cardiomyopathies: Secondary | ICD-10-CM

## 2024-05-18 DIAGNOSIS — G4733 Obstructive sleep apnea (adult) (pediatric): Secondary | ICD-10-CM

## 2024-05-18 DIAGNOSIS — J9611 Chronic respiratory failure with hypoxia: Secondary | ICD-10-CM | POA: Diagnosis not present

## 2024-05-18 DIAGNOSIS — Z0000 Encounter for general adult medical examination without abnormal findings: Secondary | ICD-10-CM | POA: Diagnosis not present

## 2024-05-18 DIAGNOSIS — E1142 Type 2 diabetes mellitus with diabetic polyneuropathy: Secondary | ICD-10-CM

## 2024-05-18 DIAGNOSIS — I4819 Other persistent atrial fibrillation: Secondary | ICD-10-CM | POA: Insufficient documentation

## 2024-05-18 DIAGNOSIS — J439 Emphysema, unspecified: Secondary | ICD-10-CM | POA: Diagnosis not present

## 2024-05-18 MED ORDER — LINACLOTIDE 72 MCG PO CAPS: 72.0000 ug | ORAL_CAPSULE | Freq: Every day | ORAL | 3 refills | Status: AC

## 2024-05-18 MED ORDER — APIXABAN 2.5 MG PO TABS
2.5000 mg | ORAL_TABLET | Freq: Two times a day (BID) | ORAL | 11 refills | Status: DC
  Filled 2024-05-18 (×2): qty 60, 30d supply, fill #0

## 2024-05-18 MED ORDER — KETOCONAZOLE 2 % EX CREA: 1.0000 | TOPICAL_CREAM | Freq: Every day | CUTANEOUS | 1 refills | Status: AC

## 2024-05-18 NOTE — Telephone Encounter (Addendum)
 S/w [name] (dpr) given the information below: A fib has returned and lasting over 24 hours. We should start Eliquis.  Please start Eliquis 2.5 mg twice daily. Discontinue Aspirin .  (If my clinic opens up next Thursday as planned, please make her an appt then. Or use one of the 2 PM spots on Mon or Tue, please).   Prescription sent in to preferred pharmacy, Appt made for Monday 05/22/24. Aspirin  taken off list

## 2024-05-18 NOTE — Patient Instructions (Signed)
 I recommend looking on [employer] (or similar) for compression stockings to help with the swelling. Just wear these during the day, especially when you are in the car, sitting for a long time, or standing on your feet for a long time.   How to Use Compression Stockings  Compression stockings are elastic socks that help increase blood flow (circulation) to the legs, decrease swelling in the legs, and reduce the chance of developing blood clots in the lower legs. Compression stockings squeeze or apply pressure to the legs. The stockings are graduated, meaning the highest amount of pressure occurs at the toes and it decreases going toward the upper part of the leg. This helps ensure proper circulation through the veins. Compression stockings are often used by people who: Are recovering from surgery. The stockings help prevent blood clots after surgery. Have poor circulation or swelling in their legs because of a medical condition, such as chronic venous insufficiency, venous stasis, or lymphedema. Have a history of getting blood clots in their legs. Have bulging (varicose) veins. Sit or stay in bed for long periods of time (immobilization). Stand for long periods of time and experience leg pain or fatigue. Follow instructions from your health care provider about how and when to wear your compression stockings. What are the risks? Generally, compression stockings are safe to wear. However, problems may occur for some people, such as: The stockings being ineffective at increasing the circulation to the legs, decreasing swelling in the legs, or reducing the chance of developing blood clots in the lower legs. Skin complications, including breaks in the skin, open wounds, blisters, or dermatitis. How to wear compression stockings Before you put on your compression stockings: Make sure that they are the correct size and degree of compression. If you do not know your size or required grade of compression, ask  your health care provider and follow the manufacturer's instructions that come with the stockings. Be sure they are the appropriate length for your medical needs. Compression stockings come in different lengths, including knee high, thigh high, and even up to the waist. Make sure that the stockings are clean, dry, and in good condition. Check the stockings for rips and tears. Do not put them on if they are ripped or torn. Put your stockings on first thing in the morning, before you get out of bed. Keep them on for as long as your health care provider advises. Most people are told to remove their compression stockings at the end of the day before bed. When you are wearing your stockings: Keep them as smooth as possible. Do not allow them to bunch up. It is especially important to prevent the stockings from bunching up around your toes or behind your knees. Make sure that the toe holes are underneath the toes and the heel patches are positioned at the heels. Do not roll the stockings downward and leave them rolled down. This can decrease blood flow to your legs. Change the stockings right away if they become wet or dirty or if they have a bad smell. If you have chronic leg wounds, make sure the wounds are properly covered or dressed before putting on your compression stockings. When you take off your stockings, check your legs and feet for: Open sores. Red spots or other areas of discoloration. Swelling. General tips Do not stop wearing compression stockings. Talk to your health care provider if your stockings feel too tight. Wash your stockings often with mild detergent in cold or warm water.  Also wash them whenever they get dirty or have a bad smell. Do not use bleach. Air-dry your stockings or dry them in a clothes dryer on low heat. It may be helpful to have two pairs so that you have a pair to wear while the other is being washed. Replace your stockings every 3-6 months. If skin moisturizing is  part of your treatment plan, apply lotion or cream at night so that your skin will be dry when you put on the stockings in the morning. It is harder to put the stockings on when you have lotion on your legs or feet. Wear nonskid shoes or slip-resistant socks when walking while wearing compression stockings. If you have difficulty putting on or taking off the compression stockings, ask your health care provider about devices that may help make this easier. Contact a health care provider and remove your stockings if: You have a prickling or tingling feeling in your feet or legs. You have new open sores, red spots, or other skin changes on your feet or legs. You have swelling or pain that gets worse. Get help right away if: You have shortness of breath or chest pain. Your heartbeat is fast or irregular. You have new swelling, pain, or warmth in your leg. You have numbness or tingling in your lower legs that does not get better after you take the stockings off. Your toes or feet are unusually cold or turn a bluish color. You feel light-headed or dizzy. These symptoms may represent a serious problem that is an emergency. Do not wait to see if the symptoms will go away. Get medical help right away. Call your local emergency services (911 in the [place] Do not drive yourself to the hospital. Summary Compression stockings are elastic socks that are worn to treat a variety of symptoms and medical conditions such as venous insufficiency, venous stasis, or lymphedema. Compression stockings help increase blood flow (circulation) to the legs, decrease swelling in the legs, and reduce the chance of developing blood clots in the lower legs. Follow instructions from your health care provider about how and when to wear your compression stockings. Do not stop wearing your compression stockings without talking to your health care provider first. This information is not intended to replace advice given to you by your  health care provider. Make sure you discuss any questions you have with your health care provider. Document Revised: 12/26/2020 Document Reviewed: 12/26/2020 Elsevier Patient Education  2024 [employer].

## 2024-05-18 NOTE — Assessment & Plan Note (Signed)
 Adherent to CPAP with nocturnal oxygen . No issues with current setup. - Continue CPAP and oxygen  therapy as prescribed

## 2024-05-18 NOTE — Assessment & Plan Note (Signed)
 Hypertension associated with diabetes. Current therapy: amlodipine  10mg  and carvedilol  3.125mg . Prescribed daily. Current BP is controlled. is tolerating medication well. is managing schedule of medication. Lab monitoring is completed today. - Monitor BP at home as directed or if you begin to have symptoms. If your blood pressure readings are consistently higher than 150/90 please let us  know.  - Low fat, low card diet with daily exercise strongly encouraged.  - Continue medication management daily.

## 2024-05-18 NOTE — Assessment & Plan Note (Signed)
 Hyperlipidemia associated with diabetes. Current therapy: atorvastatin  (Lipitor) 40mg   Prescribed daily. Last lipids well-controlled. Tolerating statin well. Managing schedule of medication.  - Monitor lipid levels annually, or more frequently if abnormal.  - Continue statin therapy at current dose and frequency - Low fat, low carb diet with daily exercise strongly encouraged.  - F/U DM 67mo

## 2024-05-18 NOTE — Assessment & Plan Note (Signed)
 Chronic. No acute dyspnea at rest or syncope. She is having more edema in her legs, however, she has been on her feet much more recently setting up her new apartment. The edema resolves with rest/elevation.  - Continue close monitoring for progression (worsening shortness of breath with activity, syncope, or swelling that is not going away. . - Maintain optimal control of heart failure, sleep apnea, COPD, etc. - Continue torsemide  and oxygen  therapy.  - Encourage low-sodium diet and daily weight monitoring, particularly is leg swelling does not improve with elevation and compression stockings - Avoid excessive exertion - Follow with cardiology Monday

## 2024-05-18 NOTE — Assessment & Plan Note (Signed)
 Chronic diabetes with significant chronic conditions affecting risks and condition including OSA, heart failure, HLD, HTN, Afib, pulmonary HTN, chronic respiratory failure, CKD stage 4, and BMI 33. Currently managing her diabetes with diet and activity. No hypoglycemic episodes detected. Regular glucose monitoring twice a week sufficient for management. Will plan to continue current plan of care.

## 2024-05-18 NOTE — Assessment & Plan Note (Signed)
 CHF with occasional dyspnea. Managed with weekly torsemide . No recent hospitalizations or exacerbations. Crackles on lung exam, but very minimal in presentation. No cough or fever. Edema noted in the lower extremities.  - Continue torsemide  as prescribed  - Monitor for signs of fluid retention and report changes - Monitor respiratory status and report worsening symptoms - Consider adjusting diuretic therapy if symptoms persist

## 2024-05-18 NOTE — Assessment & Plan Note (Signed)

## 2024-05-18 NOTE — Progress Notes (Unsigned)
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 Chlamydia Every 12 months for all sexually active persons 13-24 years Additional monitoring may be recommended for those who are considered high risk or who have symptoms Every 12 months for any woman on birth control, regardless of sexual activity PSA Men 39-59 years old with risk factors Additional screening may be recommended from age 62-69 based on risk factors, symptoms, and history  Vaccine Recommendations Tetanus Booster All adults every 10 years Flu Vaccine All patients 6 months and older every year COVID Vaccine All patients 12 years and older Initial dosing with booster May recommend additional booster based on age and health history HPV Vaccine 2 doses all patients age 22-26 Dosing may be considered for patients over 26 Shingles Vaccine (Shingrix) 2 doses all adults 55 years and older Pneumonia (Pneumovax 51) All adults 65 years and older May recommend  earlier dosing based on health history One year apart from Prevnar 23 Pneumonia (Prevnar 62) All adults 65 years and older Dosed 1 year after Pneumovax 23 Pneumonia (Prevnar 20) One time alternative to the two dosing of 13 and 23 For all adults with initial dose of 23, 20 is recommended 1 year later For all adults with initial dose of 13, 23 is still recommended as second option 1 year later

## 2024-05-18 NOTE — Assessment & Plan Note (Signed)
 Managed with ketoconazole  cream. No concerning symptoms present.

## 2024-05-18 NOTE — Assessment & Plan Note (Signed)
 New diagnosis with recent episode of sustained atrial fibrillation lasting over 24 hours per report. CHAD-VASc score indicates high risk for thromboembolic events. Initiation of Eliquis recommended by cardiologist to reduce stroke risk. Transition from aspirin  to Eliquis planned today. Irregular HR noted.  - Discontinued aspirin  - Start Eliquis as instructed.  - Follow up with cardiologist on Monday

## 2024-05-19 ENCOUNTER — Other Ambulatory Visit (HOSPITAL_COMMUNITY): Payer: Self-pay

## 2024-05-19 ENCOUNTER — Other Ambulatory Visit: Payer: Self-pay

## 2024-05-19 DIAGNOSIS — Z95811 Presence of heart assist device: Secondary | ICD-10-CM | POA: Insufficient documentation

## 2024-05-19 NOTE — Assessment & Plan Note (Signed)
 COPD with occasional dyspnea and lung crackles. No current inhaler use; previously used nebulizer post-CHF hospitalization. No recent exacerbations. - Monitor respiratory symptoms and report worsening - Consider re-evaluation of inhaler use if symptoms persist

## 2024-05-19 NOTE — Assessment & Plan Note (Signed)
 Chronic O2 at home at night and in the day as needed. No alarm symptoms present at this time time. Continue current management.

## 2024-05-19 NOTE — Assessment & Plan Note (Signed)
 Currently managed closely with cardiology. No alarm symptoms present at this time. Continue current medications and close monitoring. Report any changes immediately.

## 2024-05-19 NOTE — Assessment & Plan Note (Signed)
 Increased physical activity recently with move. Will continue to monitor. Recommend utilization of walker or cane while active to reduce risk of falls.

## 2024-05-19 NOTE — Assessment & Plan Note (Signed)
 Monitored closely with cardiology. Recent evaluation showed new onset of persistent atrial fibrillation. Recommend ongoing monitoring and management with cardiology specialists.

## 2024-05-19 NOTE — Assessment & Plan Note (Signed)
 Recommend monitoring of BP and encourage hydration to reduce risk of worsening disease state. No alarm symptoms at this time. Continue to monitor.

## 2024-05-22 ENCOUNTER — Ambulatory Visit: Attending: Cardiovascular Disease | Admitting: Cardiovascular Disease

## 2024-05-22 ENCOUNTER — Encounter: Payer: Self-pay | Admitting: Cardiovascular Disease

## 2024-05-22 VITALS — BP 106/64 | HR 62 | Ht 61.0 in | Wt 180.0 lb

## 2024-05-22 DIAGNOSIS — N184 Chronic kidney disease, stage 4 (severe): Secondary | ICD-10-CM

## 2024-05-22 DIAGNOSIS — Z950 Presence of cardiac pacemaker: Secondary | ICD-10-CM | POA: Diagnosis not present

## 2024-05-22 DIAGNOSIS — I2721 Secondary pulmonary arterial hypertension: Secondary | ICD-10-CM

## 2024-05-22 DIAGNOSIS — I5042 Chronic combined systolic (congestive) and diastolic (congestive) heart failure: Secondary | ICD-10-CM | POA: Diagnosis not present

## 2024-05-22 DIAGNOSIS — I480 Paroxysmal atrial fibrillation: Secondary | ICD-10-CM

## 2024-05-22 MED ORDER — APIXABAN 2.5 MG PO TABS: 2.5000 mg | ORAL_TABLET | Freq: Two times a day (BID) | ORAL | 11 refills | Status: AC

## 2024-05-22 NOTE — H&P (View-Only) (Signed)
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 You will receive a call the day before your procedure to confirm the appointment time. That time may have changed from the original time based on the schedule for that day.*   DIET:  Nothing to eat or drink after midnight except a sip of water with medications (see medication instructions below)  MEDICATION INSTRUCTIONS: !!IF ANY NEW MEDICATIONS ARE STARTED AFTER TODAY, PLEASE NOTIFY YOUR PROVIDER AS SOON AS POSSIBLE!!  FYI: Medications such as Semaglutide (Ozempic, Wegovy), Tirzepatide (Mounjaro, Zepbound), Dulaglutide (Trulicity), etc (GLP1 agonists) AND Canagliflozin (Invokana), Dapagliflozin (Farxiga), Empagliflozin (Jardiance), Ertugliflozin (Steglatro), Bexagliflozin [employer]) or any combination with one of these drugs such as Invokamet (Canagliflozin/Metformin), Synjardy (Empagliflozin/Metformin), etc (SGLT2 inhibitors) must be held around the time of a procedure. This is not a comprehensive list of all of these drugs. Please review all of your medications  and talk to your provider if you take any one of these. If you are not sure, ask your provider.  Continue taking your anticoagulant (blood thinner): Apixaban (Eliquis).  You will need to continue this after your procedure until you are told by your provider that it is safe to stop.    Do not take Torsemide  the morning of your procedure.   LABS:   No labs needed- performed 05/18/24  FYI:  For your safety, and to allow us  to monitor your vital signs accurately during the surgery/procedure we request: If you have artificial nails, gel coating, SNS etc, please have those removed prior to your surgery/procedure. Not having the nail coverings /polish removed may result in cancellation or delay of your surgery/procedure.  Your support person will be asked to wait in the waiting room during your procedure.  It is OK to have someone drop you off and come back when you are ready to be discharged.  You cannot drive after the procedure and will need someone to drive you home.  Bring your insurance cards.  *Special Note: Every effort is made to have your procedure done on time. Occasionally there are emergencies that occur at the hospital that may cause delays. Please be patient if a delay does occur.      Follow-Up: At [facility], you and your health needs are our priority.  As part of our continuing mission to provide you with exceptional heart care, our providers are all part of one team.  This team includes your primary Cardiologist (physician) and Advanced Practice Providers or APPs (Physician Assistants and Nurse Practitioners) who all work together to provide you with the care you need, when you need it.  Your next appointment:    Remote download 1 month after Cardioversion- 07/08/24  Dr Faten Frieson= 3 months    We recommend signing up for the patient portal called MyChart.  Sign up information is provided on this After Visit Summary.  MyChart is used to connect with patients for  Virtual Visits (Telemedicine).  Patients are able to view lab/test results, encounter notes, upcoming appointments, etc.  Non-urgent messages can be sent to your provider as [name].   To learn more about what you can do with MyChart, go to [email or web address].      Medication Adjustments/Labs and Tests Ordered: Patient Current medicines are reviewed at length with the patient today.  Concerns regarding medicines are outlined above.  Orders Placed This Encounter  Procedures   EKG 12-Lead   Meds ordered this encounter  Medications   apixaban (ELIQUIS) 2.5 MG  TABS tablet    Sig: Take 1 tablet (2.5 mg total) by mouth 2 (two) times daily.    Dispense:  60 tablet    Refill:  [address]    Signed, Jerel Balding, MD  05/25/2024 7:05 PM    [facility]'

## 2024-05-22 NOTE — Progress Notes (Unsigned)
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 receive a call the day before your procedure to confirm the appointment time. That time may have changed from the original time based on the schedule for that day.*   DIET:  Nothing to eat or drink after midnight except a sip of water with medications (see medication instructions below)  MEDICATION INSTRUCTIONS: !!IF ANY NEW MEDICATIONS ARE STARTED AFTER TODAY, PLEASE NOTIFY YOUR PROVIDER AS SOON AS POSSIBLE!!  FYI: Medications such as Semaglutide (Ozempic, Wegovy), Tirzepatide (Mounjaro, Zepbound), Dulaglutide (Trulicity), etc (GLP1 agonists) AND Canagliflozin (Invokana), Dapagliflozin (Farxiga), Empagliflozin (Jardiance), Ertugliflozin (Steglatro), Bexagliflozin [employer]) or any combination with one of these drugs such as Invokamet (Canagliflozin/Metformin), Synjardy (Empagliflozin/Metformin), etc (SGLT2 inhibitors) must be held around the time of a procedure. This is not a comprehensive list of all of these drugs. Please review all of your medications  and talk to your provider if you take any one of these. If you are not sure, ask your provider.  Continue taking your anticoagulant (blood thinner): Apixaban (Eliquis).  You will need to continue this after your procedure until you are told by your provider that it is safe to stop.    Do not take Torsemide  the morning of your procedure.   LABS:   No labs needed- performed 05/18/24  FYI:  For your safety, and to allow us  to monitor your vital signs accurately during the surgery/procedure we request: If you have artificial nails, gel coating, SNS etc, please have those removed prior to your surgery/procedure. Not having the nail coverings /polish removed may result in cancellation or delay of your surgery/procedure.  Your support person will be asked to wait in the waiting room during your procedure.  It is OK to have someone drop you off and come back when you are ready to be discharged.  You cannot drive after the procedure and will need someone to drive you home.  Bring your insurance cards.  *Special Note: Every effort is made to have your procedure done on time. Occasionally there are emergencies that occur at the hospital that may cause delays. Please be patient if a delay does occur.      Follow-Up: At [facility], you and your health needs are our priority.  As part of our continuing mission to provide you with exceptional heart care, our providers are all part of one team.  This team includes your primary Cardiologist (physician) and Advanced Practice Providers or APPs (Physician Assistants and Nurse Practitioners) who all work together to provide you with the care you need, when you need it.  Your next appointment:    Remote download 1 month after Cardioversion- 07/08/24  Dr Dyquan Minks= 3 months    We recommend signing up for the patient portal called MyChart.  Sign up information is provided on this After Visit Summary.  MyChart is used to connect with patients for  Virtual Visits (Telemedicine).  Patients are able to view lab/test results, encounter notes, upcoming appointments, etc.  Non-urgent messages can be sent to your provider as well.   To learn more about what you can do with MyChart, go to [email or web address].      Medication Adjustments/Labs and Tests Ordered: Patient Current medicines are reviewed at length with the patient today.  Concerns regarding medicines are outlined above.  Orders Placed This Encounter  Procedures   EKG 12-Lead   Meds ordered this encounter  Medications   apixaban (ELIQUIS) 2.5 MG  TABS tablet    Sig: Take 1 tablet (2.5 mg total) by mouth 2 (two) times daily.    Dispense:  60 tablet    Refill:  [address]    Signed, Jerel Balding, MD  05/25/2024 7:05 PM    [facility]'

## 2024-05-22 NOTE — Patient Instructions (Addendum)
 Medication Instructions:  No changes *If you need a refill on your cardiac medications before your next appointment, please call your pharmacy*  Lab Work: None ordered If you have labs (blood work) drawn today and your tests are completely normal, you will receive your results only by: MyChart Message (if you have MyChart) OR A paper copy in the mail If you have any lab test that is abnormal or we need to change your treatment, we will call you to review the results.  Testing/Procedures:    Dear [name]  You are scheduled for a Cardioversion on Thursday, November 20 with Dr. Francyne.  Please arrive at the [facility] (Main Entrance A) at [facility]: [address] [place], [place] [address] at 7:30 AM (This time is 1 hour(s) before your procedure to ensure your preparation).   Free valet parking service is available. You will check in at ADMITTING.   *Please Note: You will receive a call the day before your procedure to confirm the appointment time. That time may have changed from the original time based on the schedule for that day.*   DIET:  Nothing to eat or drink after midnight except a sip of water with medications (see medication instructions below)  MEDICATION INSTRUCTIONS: !!IF ANY NEW MEDICATIONS ARE STARTED AFTER TODAY, PLEASE NOTIFY YOUR PROVIDER AS SOON AS POSSIBLE!!  FYI: Medications such as Semaglutide (Ozempic, Wegovy), Tirzepatide (Mounjaro, Zepbound), Dulaglutide (Trulicity), etc (GLP1 agonists) AND Canagliflozin (Invokana), Dapagliflozin (Farxiga), Empagliflozin (Jardiance), Ertugliflozin (Steglatro), Bexagliflozin [employer]) or any combination with one of these drugs such as Invokamet (Canagliflozin/Metformin), Synjardy (Empagliflozin/Metformin), etc (SGLT2 inhibitors) must be held around the time of a procedure. This is not a comprehensive list of all of these drugs. Please review all of your medications and talk to your provider if you take any  one of these. If you are not sure, ask your provider.  Continue taking your anticoagulant (blood thinner): Apixaban (Eliquis).  You will need to continue this after your procedure until you are told by your provider that it is safe to stop.    Do not take Torsemide  the morning of your procedure.   LABS:   No labs needed- performed 05/18/24  FYI:  For your safety, and to allow us  to monitor your vital signs accurately during the surgery/procedure we request: If you have artificial nails, gel coating, SNS etc, please have those removed prior to your surgery/procedure. Not having the nail coverings /polish removed may result in cancellation or delay of your surgery/procedure.  Your support person will be asked to wait in the waiting room during your procedure.  It is OK to have someone drop you off and come back when you are ready to be discharged.  You cannot drive after the procedure and will need someone to drive you home.  Bring your insurance cards.  *Special Note: Every effort is made to have your procedure done on time. Occasionally there are emergencies that occur at the hospital that may cause delays. Please be patient if a delay does occur.      Follow-Up: At [facility], you and your health needs are our priority.  As part of our continuing mission to provide you with exceptional heart care, our providers are all part of one team.  This team includes your primary Cardiologist (physician) and Advanced Practice Providers or APPs (Physician Assistants and Nurse Practitioners) who all work together to provide you with the care you need, when you need it.  Your next appointment:    Remote download 1 month after Cardioversion- 07/08/24  Dr Croitoru= 3 months    We recommend signing up for the patient portal called MyChart.  Sign up information is provided on this After Visit Summary.  MyChart is used to connect with patients for Virtual Visits (Telemedicine).  Patients  are able to view lab/test results, encounter notes, upcoming appointments, etc.  Non-urgent messages can be sent to your provider as well.   To learn more about what you can do with MyChart, go to [email or web address].

## 2024-05-23 LAB — CBC WITH DIFFERENTIAL/PLATELET
Basophils Absolute: 0 x10E3/uL (ref 0.0–0.2)
Basos: 1 %
EOS (ABSOLUTE): 0.1 x10E3/uL (ref 0.0–0.4)
Eos: 2 %
Hematocrit: 35.1 % (ref 34.0–46.6)
Hemoglobin: 10.7 g/dL — ABNORMAL LOW (ref 11.1–15.9)
Immature Grans (Abs): 0 x10E3/uL (ref 0.0–0.1)
Immature Granulocytes: 0 %
Lymphocytes Absolute: 0.7 x10E3/uL (ref 0.7–3.1)
Lymphs: 19 %
MCH: 26.3 pg — ABNORMAL LOW (ref 26.6–33.0)
MCHC: 30.5 g/dL — ABNORMAL LOW (ref 31.5–35.7)
MCV: 86 fL (ref 79–97)
Monocytes Absolute: 0.3 x10E3/uL (ref 0.1–0.9)
Monocytes: 9 %
Neutrophils Absolute: 2.6 x10E3/uL (ref 1.4–7.0)
Neutrophils: 69 %
Platelets: 268 x10E3/uL (ref 150–450)
RBC: 4.07 x10E6/uL (ref 3.77–5.28)
RDW: 14.5 % (ref 11.7–15.4)
WBC: 3.7 x10E3/uL (ref 3.4–10.8)

## 2024-05-23 LAB — CMP14+EGFR
ALT: 13 IU/L (ref 0–32)
AST: 25 IU/L (ref 0–40)
Albumin: 4.5 g/dL (ref 3.7–4.7)
Alkaline Phosphatase: 114 IU/L (ref 48–129)
BUN/Creatinine Ratio: 20 (ref 12–28)
BUN: 48 mg/dL — ABNORMAL HIGH (ref 8–27)
Bilirubin Total: 0.6 mg/dL (ref 0.0–1.2)
CO2: 21 mmol/L (ref 20–29)
Calcium: 9.3 mg/dL (ref 8.7–10.3)
Chloride: 101 mmol/L (ref 96–106)
Creatinine, Ser: 2.4 mg/dL — ABNORMAL HIGH (ref 0.57–1.00)
Globulin, Total: 2.8 g/dL (ref 1.5–4.5)
Glucose: 126 mg/dL — ABNORMAL HIGH (ref 70–99)
Potassium: 3.9 mmol/L (ref 3.5–5.2)
Sodium: 141 mmol/L (ref 134–144)
Total Protein: 7.3 g/dL (ref 6.0–8.5)
eGFR: 19 mL/min/1.73 — ABNORMAL LOW (ref >59)

## 2024-05-23 LAB — HEMOGLOBIN A1C
Est. average glucose Bld gHb Est-mCnc: 137 mg/dL
Hgb A1c MFr Bld: 6.4 % — ABNORMAL HIGH (ref 4.8–5.6)

## 2024-05-23 LAB — LIPID PANEL
Chol/HDL Ratio: 1.5 ratio (ref 0.0–4.4)
Cholesterol, Total: 113 mg/dL (ref 100–199)
HDL: 75 mg/dL (ref >39)
LDL Chol Calc (NIH): 24 mg/dL (ref 0–99)
Triglycerides: 65 mg/dL (ref 0–149)
VLDL Cholesterol Cal: 14 mg/dL (ref 5–40)

## 2024-05-23 LAB — MICROALBUMIN / CREATININE URINE RATIO

## 2024-05-23 NOTE — Progress Notes (Signed)
 Remote PPM Transmission

## 2024-05-24 ENCOUNTER — Ambulatory Visit: Payer: Self-pay | Admitting: Nurse Practitioner

## 2024-05-24 DIAGNOSIS — N184 Chronic kidney disease, stage 4 (severe): Secondary | ICD-10-CM

## 2024-05-24 DIAGNOSIS — E1169 Type 2 diabetes mellitus with other specified complication: Secondary | ICD-10-CM

## 2024-05-24 DIAGNOSIS — D649 Anemia, unspecified: Secondary | ICD-10-CM

## 2024-05-26 ENCOUNTER — Telehealth: Payer: Self-pay

## 2024-05-26 NOTE — Telephone Encounter (Signed)
 Copied from CRM #[id number]. Topic: Clinical - Prescription Issue >> May 26, 2024 10:45 AM Hadassah PARAS wrote: Reason for CRM: Pt is allergic to polysorbate and it is included iin the ketoconazole  (NIZORAL ) 2 % cream and pharmacy would like to know if it is okay to fill. Please advise [phone]

## 2024-05-30 ENCOUNTER — Telehealth: Payer: Self-pay

## 2024-05-30 NOTE — Telephone Encounter (Signed)
 Pt feeling better today.  Curious if she is still in atrial fibrillation or if she converted.  Scheduled for cardioversion on 06/08/2024.  Transmission received.  Presenting rhythm AFib/VP-VS.  Daughter notified per DPR.  No further action needed.

## 2024-06-07 ENCOUNTER — Ambulatory Visit: Admitting: Podiatry

## 2024-06-07 NOTE — Anesthesia Preprocedure Evaluation (Signed)
 Anesthesia Evaluation  Patient identified by MRN, date of birth, ID band Patient awake    Reviewed: Allergy & Precautions, NPO status , Patient's Chart, lab work & pertinent test results  History of Anesthesia Complications Negative for: history of anesthetic complications  Airway Mallampati: II  TM Distance: >3 FB Neck ROM: Full    Dental no notable dental hx. (+) Edentulous Upper, Edentulous Lower   Pulmonary sleep apnea , former smoker   Pulmonary exam normal breath sounds clear to auscultation       Cardiovascular hypertension, Pt. on medications and Pt. on home beta blockers (-) angina + CAD and +CHF  (-) Past MI Normal cardiovascular exam Rhythm:Irregular Rate:Abnormal     Neuro/Psych  Neuromuscular disease CVA    GI/Hepatic ,neg GERD  ,,  Endo/Other  diabetes    Renal/GU Renal disease     Musculoskeletal   Abdominal   Peds  Hematology  (+) Blood dyscrasia, anemia   Anesthesia Other Findings All: benazeril lotrel  Reproductive/Obstetrics                              Anesthesia Physical Anesthesia Plan  ASA: 3  Anesthesia Plan: General   Post-op Pain Management: Minimal or no pain anticipated   Induction: Intravenous  PONV Risk Score and Plan: Treatment may vary due to age or medical condition and Propofol  infusion  Airway Management Planned: Mask  Additional Equipment: None  Intra-op Plan:   Post-operative Plan:   Informed Consent: I have reviewed the patients History and Physical, chart, labs and discussed the procedure including the risks, benefits and alternatives for the proposed anesthesia with the patient or authorized representative who has indicated his/her understanding and acceptance.     Dental advisory given  Plan Discussed with:   Anesthesia Plan Comments:          Anesthesia Quick Evaluation

## 2024-06-08 ENCOUNTER — Ambulatory Visit (HOSPITAL_BASED_OUTPATIENT_CLINIC_OR_DEPARTMENT_OTHER): Payer: Self-pay | Admitting: Anesthesiology

## 2024-06-08 ENCOUNTER — Encounter (HOSPITAL_COMMUNITY): Payer: Self-pay | Admitting: Anesthesiology

## 2024-06-08 ENCOUNTER — Encounter (HOSPITAL_COMMUNITY): Admission: RE | Disposition: A | Payer: Self-pay | Source: Home / Self Care | Attending: Cardiovascular Disease

## 2024-06-08 ENCOUNTER — Other Ambulatory Visit: Payer: Self-pay

## 2024-06-08 ENCOUNTER — Ambulatory Visit (HOSPITAL_COMMUNITY)
Admission: RE | Admit: 2024-06-08 | Discharge: 2024-06-08 | Disposition: A | Discharge status: Home / Self Care | Attending: Cardiovascular Disease | Admitting: Cardiovascular Disease

## 2024-06-08 ENCOUNTER — Encounter (HOSPITAL_COMMUNITY): Payer: Self-pay | Admitting: Cardiovascular Disease

## 2024-06-08 DIAGNOSIS — I130 Hypertensive heart and chronic kidney disease with heart failure and stage 1 through stage 4 chronic kidney disease, or unspecified chronic kidney disease: Secondary | ICD-10-CM | POA: Diagnosis not present

## 2024-06-08 DIAGNOSIS — I495 Sick sinus syndrome: Secondary | ICD-10-CM | POA: Insufficient documentation

## 2024-06-08 DIAGNOSIS — I4891 Unspecified atrial fibrillation: Secondary | ICD-10-CM | POA: Diagnosis not present

## 2024-06-08 DIAGNOSIS — Z85048 Personal history of other malignant neoplasm of rectum, rectosigmoid junction, and anus: Secondary | ICD-10-CM | POA: Insufficient documentation

## 2024-06-08 DIAGNOSIS — I2510 Atherosclerotic heart disease of native coronary artery without angina pectoris: Secondary | ICD-10-CM | POA: Insufficient documentation

## 2024-06-08 DIAGNOSIS — I4819 Other persistent atrial fibrillation: Secondary | ICD-10-CM | POA: Insufficient documentation

## 2024-06-08 DIAGNOSIS — Z79899 Other long term (current) drug therapy: Secondary | ICD-10-CM | POA: Insufficient documentation

## 2024-06-08 DIAGNOSIS — G4733 Obstructive sleep apnea (adult) (pediatric): Secondary | ICD-10-CM | POA: Diagnosis not present

## 2024-06-08 DIAGNOSIS — I428 Other cardiomyopathies: Secondary | ICD-10-CM | POA: Diagnosis not present

## 2024-06-08 DIAGNOSIS — E1122 Type 2 diabetes mellitus with diabetic chronic kidney disease: Secondary | ICD-10-CM

## 2024-06-08 DIAGNOSIS — E7800 Pure hypercholesterolemia, unspecified: Secondary | ICD-10-CM | POA: Insufficient documentation

## 2024-06-08 DIAGNOSIS — Z86711 Personal history of pulmonary embolism: Secondary | ICD-10-CM | POA: Insufficient documentation

## 2024-06-08 DIAGNOSIS — I5042 Chronic combined systolic (congestive) and diastolic (congestive) heart failure: Secondary | ICD-10-CM | POA: Diagnosis not present

## 2024-06-08 DIAGNOSIS — I2721 Secondary pulmonary arterial hypertension: Secondary | ICD-10-CM | POA: Insufficient documentation

## 2024-06-08 DIAGNOSIS — N184 Chronic kidney disease, stage 4 (severe): Secondary | ICD-10-CM | POA: Insufficient documentation

## 2024-06-08 DIAGNOSIS — E1142 Type 2 diabetes mellitus with diabetic polyneuropathy: Secondary | ICD-10-CM | POA: Diagnosis not present

## 2024-06-08 DIAGNOSIS — Z7901 Long term (current) use of anticoagulants: Secondary | ICD-10-CM | POA: Insufficient documentation

## 2024-06-08 DIAGNOSIS — Z87891 Personal history of nicotine dependence: Secondary | ICD-10-CM | POA: Diagnosis not present

## 2024-06-08 HISTORY — PX: CARDIOVERSION: EP1203

## 2024-06-08 LAB — POCT I-STAT, CHEM 8
BUN: 40 mg/dL — ABNORMAL HIGH (ref 8–23)
Calcium, Ion: 1.21 mmol/L (ref 1.15–1.40)
Chloride: 102 mmol/L (ref 98–111)
Creatinine, Ser: 2 mg/dL — ABNORMAL HIGH (ref 0.44–1.00)
Glucose, Bld: 137 mg/dL — ABNORMAL HIGH (ref 70–99)
HCT: 37 % (ref 36.0–46.0)
Hemoglobin: 12.6 g/dL (ref 12.0–15.0)
Potassium: 3.9 mmol/L (ref 3.5–5.1)
Sodium: 141 mmol/L (ref 135–145)
TCO2: 25 mmol/L (ref 22–32)

## 2024-06-08 SURGERY — CARDIOVERSION (CATH LAB)
Anesthesia: General

## 2024-06-08 MED ORDER — SODIUM CHLORIDE 0.9% FLUSH: 3.0000 mL | INTRAVENOUS | Status: DC | PRN

## 2024-06-08 MED ORDER — LIDOCAINE 2% (20 MG/ML) 5 ML SYRINGE
INTRAMUSCULAR | Status: DC | PRN
  Administered 2024-06-08: 50 mg via INTRAVENOUS

## 2024-06-08 MED ORDER — SODIUM CHLORIDE 0.9% FLUSH: 3.0000 mL | Freq: Two times a day (BID) | INTRAVENOUS | Status: DC

## 2024-06-08 MED ORDER — PROPOFOL 10 MG/ML IV BOLUS
INTRAVENOUS | Status: DC | PRN
  Administered 2024-06-08: 50 mg via INTRAVENOUS

## 2024-06-08 MED ORDER — SODIUM CHLORIDE 0.9 % IV SOLN: 250.0000 mL | INTRAVENOUS | Status: DC | PRN

## 2024-06-08 SURGICAL SUPPLY — 1 items: PAD DEFIB RADIO PHYSIO CONN (PAD) ×1 IMPLANT

## 2024-06-08 NOTE — Discharge Instructions (Signed)

## 2024-06-08 NOTE — Op Note (Signed)
 Procedure: Electrical Cardioversion Indications:  Atrial Fibrillation  Procedure Details:  Consent: Risks of procedure as well as the alternatives and risks of each were explained to the (patient/caregiver).  Consent for procedure obtained.  Time Out: Verified patient identification, verified procedure, site/side was marked, verified correct patient position, special equipment/implants available, medications/allergies/relevent history reviewed, required imaging and test results available.  Performed  Patient placed on cardiac monitor, pulse oximetry, supplemental oxygen  as necessary.  Sedation given: propofl 50 mg IV, Dr. Julieann Pacer pads placed anterior and posterior chest.  Cardioverted 1 time(s).  Cardioversion with synchronized biphasic 200J shock.  Evaluation: Findings: Post procedure EKG shows: A paced, V sensed Complications: None Patient did tolerate procedure well.  Pacemaker function normal before and after DC CV. Atrial preference pacing and post mode switch overdrive pacing on.  Time Spent Directly with the Patient:  30 minutes   [name] 06/08/2024, 8:25 AM

## 2024-06-08 NOTE — Anesthesia Postprocedure Evaluation (Signed)
 Anesthesia Post Note  Patient: [name]  Procedure(s) Performed: CARDIOVERSION     Patient location during evaluation: Cath Lab Anesthesia Type: General Level of consciousness: awake and alert Pain management: pain level controlled Vital Signs Assessment: post-procedure vital signs reviewed and stable Respiratory status: spontaneous breathing, nonlabored ventilation, respiratory function stable and patient connected to nasal cannula oxygen  Cardiovascular status: blood pressure returned to baseline and stable Postop Assessment: no apparent nausea or vomiting Anesthetic complications: no   There were no known notable events for this encounter.  Last Vitals:  Vitals:   06/08/24 0823 06/08/24 0833  BP: 101/83 113/86  Pulse: 84 80  Resp: 13 20  Temp:    SpO2: 97% 97%    Last Pain:  Vitals:   06/08/24 0833  TempSrc:   PainSc: 0-No pain                 Garnette DELENA Gab

## 2024-06-08 NOTE — Transfer of Care (Signed)
 Immediate Anesthesia Transfer of Care Note  Patient: [name]  Procedure(s) Performed: CARDIOVERSION  Patient Location: PACU  Anesthesia Type:General  Level of Consciousness: awake, alert , and oriented  Airway & Oxygen  Therapy: Patient Spontanous Breathing and Patient connected to nasal cannula oxygen   Post-op Assessment: Report given to RN and Post -op Vital signs reviewed and stable  Post vital signs: Reviewed and stable   Last Vitals:  Vitals Value Taken Time  BP 100/58 0811  Temp 97 0811  Pulse 73 0811  Resp 16 0811  SpO2 94 0811    Last Pain:  Vitals:   06/08/24 0703  TempSrc: Temporal         Complications: No notable events documented.

## 2024-06-08 NOTE — Interval H&P Note (Signed)
 History and Physical Interval Note:  06/08/2024 8:00 AM  [name]  has presented today for surgery, with the diagnosis of AFIB.  The various methods of treatment have been discussed with the patient and family. After consideration of risks, benefits and other options for treatment, the patient has consented to  Procedure(s): CARDIOVERSION (N/A) as a surgical intervention.  The patient's history has been reviewed, patient examined, no change in status, stable for surgery.  I have reviewed the patient's chart and labs.  Questions were answered to the patient's satisfaction.     Camesha Farooq

## 2024-06-19 ENCOUNTER — Ambulatory Visit: Attending: Nurse Practitioner

## 2024-06-19 ENCOUNTER — Telehealth: Payer: Self-pay

## 2024-06-19 MED ORDER — CARVEDILOL 3.125 MG PO TABS: 3.1250 mg | ORAL_TABLET | Freq: Two times a day (BID) | ORAL | 3 refills | Status: AC

## 2024-06-19 NOTE — Telephone Encounter (Signed)
 Verbally gave [name] the information. She verbalized understanding.

## 2024-06-19 NOTE — Telephone Encounter (Signed)
 Maintaining normal rhythm since the cardioversion, please let [name] know

## 2024-06-19 NOTE — Telephone Encounter (Signed)
 Transmission received.  Presenting rhythm AP/VS.

## 2024-06-22 DIAGNOSIS — E559 Vitamin D deficiency, unspecified: Secondary | ICD-10-CM | POA: Diagnosis not present

## 2024-06-22 DIAGNOSIS — I130 Hypertensive heart and chronic kidney disease with heart failure and stage 1 through stage 4 chronic kidney disease, or unspecified chronic kidney disease: Secondary | ICD-10-CM | POA: Diagnosis not present

## 2024-06-22 DIAGNOSIS — D631 Anemia in chronic kidney disease: Secondary | ICD-10-CM | POA: Diagnosis not present

## 2024-06-22 DIAGNOSIS — N2581 Secondary hyperparathyroidism of renal origin: Secondary | ICD-10-CM | POA: Diagnosis not present

## 2024-06-22 DIAGNOSIS — R609 Edema, unspecified: Secondary | ICD-10-CM | POA: Diagnosis not present

## 2024-06-22 DIAGNOSIS — I509 Heart failure, unspecified: Secondary | ICD-10-CM | POA: Diagnosis not present

## 2024-06-22 DIAGNOSIS — N184 Chronic kidney disease, stage 4 (severe): Secondary | ICD-10-CM | POA: Diagnosis not present

## 2024-06-27 ENCOUNTER — Telehealth: Payer: Self-pay

## 2024-06-27 ENCOUNTER — Other Ambulatory Visit: Payer: Self-pay

## 2024-06-27 DIAGNOSIS — N184 Chronic kidney disease, stage 4 (severe): Secondary | ICD-10-CM

## 2024-06-27 NOTE — Telephone Encounter (Signed)
 Pt. Has an appt. On 06/29/24 if ok I will put in an order for CMP.   Copied from CRM #[id number]. Topic: Clinical - Request for Lab/Test Order >> Jun 27, 2024 12:03 PM Darshell M wrote: Reason for CRM: Patient's daughter requesting orders for BMP to check potassium levels for 12/11 lab appointment. Nephrologist ordered daily dose of Tosamide for patient to address swelling and daughter wants to make sure potassium levels remain normal. CB# [name] [phone]

## 2024-06-27 NOTE — Progress Notes (Signed)
 Care Guide Pharmacy Note  06/27/2024 Name: [name] MRN: [id number] DOB: 03/28/1941  Referred By: Oris Camie BRAVO, NP Reason for referral: Complex Care Management (Outreach to schedule referral with PharmD)   [name] is a 83 y.o. year old female who is a primary care patient of Early, Camie BRAVO, NP.  PEARSON REASONS was referred to the pharmacist for assistance related to: DMII and CKD Stage 4  Successful contact was made with the patient to discuss pharmacy services including being ready for the pharmacist to call at least 5 minutes before the scheduled appointment time and to have medication bottles and any blood pressure readings ready for review. The patient agreed to meet with the pharmacist via telephone visit on 06/29/24  .[name] [facility], [facility] Guide  Direct Dial: [phone]  Fax [phone]

## 2024-06-29 ENCOUNTER — Other Ambulatory Visit

## 2024-06-29 DIAGNOSIS — D649 Anemia, unspecified: Secondary | ICD-10-CM

## 2024-06-29 DIAGNOSIS — E1169 Type 2 diabetes mellitus with other specified complication: Secondary | ICD-10-CM

## 2024-06-29 DIAGNOSIS — N184 Chronic kidney disease, stage 4 (severe): Secondary | ICD-10-CM

## 2024-06-30 LAB — CMP14+EGFR
ALT: 8 IU/L (ref 0–32)
AST: 21 IU/L (ref 0–40)
Albumin: 4.6 g/dL (ref 3.7–4.7)
Alkaline Phosphatase: 124 IU/L (ref 48–129)
BUN/Creatinine Ratio: 15 (ref 12–28)
BUN: 31 mg/dL — ABNORMAL HIGH (ref 8–27)
Bilirubin Total: 0.7 mg/dL (ref 0.0–1.2)
CO2: 25 mmol/L (ref 20–29)
Calcium: 8.9 mg/dL (ref 8.7–10.3)
Chloride: 102 mmol/L (ref 96–106)
Creatinine, Ser: 2.06 mg/dL — ABNORMAL HIGH (ref 0.57–1.00)
Globulin, Total: 2.7 g/dL (ref 1.5–4.5)
Glucose: 104 mg/dL — ABNORMAL HIGH (ref 70–99)
Potassium: 5 mmol/L (ref 3.5–5.2)
Sodium: 144 mmol/L (ref 134–144)
Total Protein: 7.3 g/dL (ref 6.0–8.5)
eGFR: 23 mL/min/1.73 — ABNORMAL LOW (ref >59)

## 2024-06-30 LAB — IRON,TIBC AND FERRITIN PANEL
Ferritin: 330 ng/mL — ABNORMAL HIGH (ref 15–150)
Iron Saturation: 16 % (ref 15–55)
Iron: 38 ug/dL (ref 27–139)
Total Iron Binding Capacity: 235 ug/dL — ABNORMAL LOW (ref 250–450)
UIBC: 197 ug/dL (ref 118–369)

## 2024-06-30 LAB — MICROALBUMIN / CREATININE URINE RATIO
Creatinine, Urine: 79.8 mg/dL
Microalb/Creat Ratio: 164 mg/g{creat} — ABNORMAL HIGH (ref 0–29)
Microalbumin, Urine: 131.2 ug/mL

## 2024-06-30 LAB — CBC WITH DIFFERENTIAL/PLATELET
Basophils Absolute: 0 x10E3/uL (ref 0.0–0.2)
Basos: 1 %
EOS (ABSOLUTE): 0.1 x10E3/uL (ref 0.0–0.4)
Eos: 2 %
Hematocrit: 32.9 % — ABNORMAL LOW (ref 34.0–46.6)
Hemoglobin: 10.1 g/dL — ABNORMAL LOW (ref 11.1–15.9)
Immature Grans (Abs): 0 x10E3/uL (ref 0.0–0.1)
Immature Granulocytes: 0 %
Lymphocytes Absolute: 0.9 x10E3/uL (ref 0.7–3.1)
Lymphs: 21 %
MCH: 25.7 pg — ABNORMAL LOW (ref 26.6–33.0)
MCHC: 30.7 g/dL — ABNORMAL LOW (ref 31.5–35.7)
MCV: 84 fL (ref 79–97)
Monocytes Absolute: 0.5 x10E3/uL (ref 0.1–0.9)
Monocytes: 12 %
Neutrophils Absolute: 2.7 x10E3/uL (ref 1.4–7.0)
Neutrophils: 64 %
Platelets: 297 x10E3/uL (ref 150–450)
RBC: 3.93 x10E6/uL (ref 3.77–5.28)
RDW: 15.1 % (ref 11.7–15.4)
WBC: 4.3 x10E3/uL (ref 3.4–10.8)

## 2024-07-05 ENCOUNTER — Ambulatory Visit: Payer: Self-pay | Admitting: Nurse Practitioner

## 2024-07-06 ENCOUNTER — Other Ambulatory Visit: Payer: Self-pay

## 2024-07-06 DIAGNOSIS — D649 Anemia, unspecified: Secondary | ICD-10-CM

## 2024-07-06 DIAGNOSIS — E611 Iron deficiency: Secondary | ICD-10-CM

## 2024-07-10 ENCOUNTER — Other Ambulatory Visit: Payer: Self-pay | Admitting: Nurse Practitioner

## 2024-07-10 DIAGNOSIS — Z1231 Encounter for screening mammogram for malignant neoplasm of breast: Secondary | ICD-10-CM

## 2024-07-11 ENCOUNTER — Telehealth: Payer: Self-pay

## 2024-07-11 NOTE — Telephone Encounter (Signed)
 Spoke with daughter and pt will send transmission after 1 pm today when she wakes up

## 2024-07-11 NOTE — Telephone Encounter (Signed)
 [name]

## 2024-07-11 NOTE — Telephone Encounter (Signed)
 Pt has sent that transmission for the 1 week post op cardioversion

## 2024-08-02 ENCOUNTER — Other Ambulatory Visit: Payer: Self-pay | Admitting: Cardiovascular Disease

## 2024-08-03 NOTE — Telephone Encounter (Signed)
 In accordance with refill protocols, please review and address the following requirements before this medication refill can be authorized:  Labs

## 2024-08-04 ENCOUNTER — Telehealth: Payer: Self-pay | Admitting: *Deleted

## 2024-08-04 NOTE — Progress Notes (Signed)
 Complex Care Management Care Guide Note  08/04/2024 Name: [name] MRN: [id number] DOB: 1940/09/18  [name] is a 84 y.o. year old female who is a primary care patient of Early, Camie BRAVO, NP and is actively engaged with the care management team. I reached out to Raguel JONELLE Lunger by phone today to assist with re-scheduling  with the Pharmacist.  Follow up plan: Unsuccessful telephone outreach attempt made. A HIPAA compliant phone message was left for the patient providing contact information and requesting a return call. No further outreach attempts will be made due to inability to maintain patient contact.   Thedford Franks, CMA, AAMA [place]  [facility], [facility] Guide, Lead Direct Dial: [phone]  Fax: [phone]

## 2024-08-04 NOTE — Telephone Encounter (Signed)
 Here is another example.  We have lots of patients with heart failure on diuretics who also need potassium supplements and have abnormal creatinine levels.  While this is obviously a clinical concern, I do not think it should prevent a refill.

## 2024-08-08 ENCOUNTER — Ambulatory Visit: Admitting: Podiatry

## 2024-08-08 ENCOUNTER — Encounter: Payer: Self-pay | Admitting: Podiatry

## 2024-08-08 DIAGNOSIS — L84 Corns and callosities: Secondary | ICD-10-CM

## 2024-08-08 DIAGNOSIS — B351 Tinea unguium: Secondary | ICD-10-CM

## 2024-08-08 DIAGNOSIS — M79674 Pain in right toe(s): Secondary | ICD-10-CM | POA: Diagnosis not present

## 2024-08-08 DIAGNOSIS — E1142 Type 2 diabetes mellitus with diabetic polyneuropathy: Secondary | ICD-10-CM | POA: Diagnosis not present

## 2024-08-08 DIAGNOSIS — M79675 Pain in left toe(s): Secondary | ICD-10-CM

## 2024-08-09 ENCOUNTER — Ambulatory Visit: Payer: Self-pay | Admitting: Nurse Practitioner

## 2024-08-09 DIAGNOSIS — D509 Iron deficiency anemia, unspecified: Secondary | ICD-10-CM

## 2024-08-09 LAB — IRON,TIBC AND FERRITIN PANEL
Ferritin: 304 ng/mL — AB (ref 15–150)
Iron Saturation: 18 % (ref 15–55)
Iron: 47 ug/dL (ref 27–139)
Total Iron Binding Capacity: 258 ug/dL (ref 250–450)
UIBC: 211 ug/dL (ref 118–369)

## 2024-08-09 LAB — CBC WITH DIFFERENTIAL/PLATELET
Basophils Absolute: 0 x10E3/uL (ref 0.0–0.2)
Basos: 0 %
EOS (ABSOLUTE): 0.1 x10E3/uL (ref 0.0–0.4)
Eos: 2 %
Hematocrit: 34.6 % (ref 34.0–46.6)
Hemoglobin: 10.9 g/dL — ABNORMAL LOW (ref 11.1–15.9)
Immature Grans (Abs): 0 x10E3/uL (ref 0.0–0.1)
Immature Granulocytes: 0 %
Lymphocytes Absolute: 0.9 x10E3/uL (ref 0.7–3.1)
Lymphs: 22 %
MCH: 26.5 pg — ABNORMAL LOW (ref 26.6–33.0)
MCHC: 31.5 g/dL (ref 31.5–35.7)
MCV: 84 fL (ref 79–97)
Monocytes Absolute: 0.4 x10E3/uL (ref 0.1–0.9)
Monocytes: 10 %
Neutrophils Absolute: 2.5 x10E3/uL (ref 1.4–7.0)
Neutrophils: 66 %
Platelets: 231 x10E3/uL (ref 150–450)
RBC: 4.12 x10E6/uL (ref 3.77–5.28)
RDW: 15.6 % — ABNORMAL HIGH (ref 11.7–15.4)
WBC: 3.9 x10E3/uL (ref 3.4–10.8)

## 2024-08-10 LAB — B12 AND FOLATE PANEL
Folate: 7.9 ng/mL
Vitamin B-12: 403 pg/mL (ref 232–1245)

## 2024-08-10 LAB — SPECIMEN STATUS REPORT

## 2024-08-13 NOTE — Progress Notes (Signed)
 "  Subjective:  Patient ID: [name], female    DOB: 1941/07/11,  MRN: [id number]  [name] presents to clinic today for at risk foot care with history of diabetic neuropathy and callus(es) of both feet and painful mycotic toenails that are difficult to trim. Painful toenails interfere with ambulation. Aggravating factors include wearing enclosed shoe gear. Pain is relieved with periodic professional debridement. Painful calluses are aggravated when weightbearing with and without shoegear. Pain is relieved with periodic professional debridement. She is accompanied by her daughter, [name], on otday's visit. Patient has been under care of Cardiology for afib having undergone a cardioversion in November. Chief Complaint  Patient presents with   [facility]    NIDDM Patient with A1C of 6.4 presents today for [facility] and nail trim PcP : Camie Doing  Last visit 04/28/2024   New problem(s): None.   PCP is Early, Camie BRAVO, NP.  Allergies[1]  Review of Systems: Negative except as noted in the HPI.  Objective: No changes noted in today's physical examination. There were no vitals filed for this visit. [name] is a pleasant 84 y.o. female in NAD. AAO x 3.  Vascular Examination: Capillary refill time <3 seconds b/l LE. Palpable pedal pulses b/l LE. Digital hair absent b/l. No pedal edema b/l. Skin temperature gradient WNL b/l. No varicosities b/l. No cyanosis or clubbing. No ischemia or gangrene. .  Dermatological Examination: Pedal skin with normal turgor, texture and tone b/l. No open wounds. No interdigital macerations b/l. Toenails 1-5 b/l thickened, discolored, dystrophic with subungual debris. There is pain on palpation to dorsal aspect of nailplates. Hyperkeratotic lesion(s) submet head 1 b/l and submet head 5 b/l.  No erythema, no edema, no drainage, no fluctuance.  Neurological Examination: Protective sensation decreased with 10 gram monofilament b/l.  Musculoskeletal  Examination: Muscle strength 5/5 to all lower extremity muscle groups bilaterally. HAV with bunion bilaterally and hammertoes 2-5 b/l. No pain, crepitus or joint limitation noted with ROM b/l LE.  Patient ambulates independently without assistive aids.  Assessment/Plan: 1. Pain due to onychomycosis of toenails of both feet   2. Callus   3. Diabetic peripheral neuropathy associated with type 2 diabetes mellitus (HCC)   Patient was evaluated and treated. All patient's and/or POA's questions/concerns addressed on today's visit. Toenails 1-5 b/l debrided in length and girth without incident. Callus(es) submet head 1 left foot, submet head 1 right foot, submet head 5 left foot, and submet head 5 right foot pared with sharp debridement without incident. Continue daily foot inspections and monitor blood glucose per PCP/Endocrinologist's recommendations. Continue soft, supportive shoe gear daily. Report any pedal injuries to medical professional. Call office if there are any questions/concerns.Return in about 3 months (around 11/06/2024).  Delon LITTIE Merlin, DPM      [place] LOCATION: [address], [place] [address]                   Office [phone]   [place] LOCATION: [address] [place], [place] [address] Office [phone]     [1]  Allergies Allergen Reactions   Benazepril Hcl Other (See Comments)    Unknown   Lotrel [Amlodipine  Besy-Benazepril Hcl] Other (See  Comments)    Hypotension    Talwin [Pentazocine] Other (See Comments)    Hallucinations     "

## 2024-08-15 ENCOUNTER — Ambulatory Visit: Payer: Medicare PPO

## 2024-08-15 DIAGNOSIS — I495 Sick sinus syndrome: Secondary | ICD-10-CM

## 2024-08-15 LAB — CUP PACEART REMOTE DEVICE CHECK
Battery Remaining Longevity: 137 mo
Battery Voltage: 3.03 V
Brady Statistic AP VP Percent: 11.73 %
Brady Statistic AP VS Percent: 70.11 %
Brady Statistic AS VP Percent: 1.73 %
Brady Statistic AS VS Percent: 16.44 %
Brady Statistic RA Percent Paced: 78.06 %
Brady Statistic RV Percent Paced: 13.3 %
Date Time Interrogation Session: 20260127003105
Implantable Lead Connection Status: 753985
Implantable Lead Connection Status: 753985
Implantable Lead Implant Date: 20070309
Implantable Lead Implant Date: 20070309
Implantable Lead Location: 753859
Implantable Lead Location: 753860
Implantable Lead Model: 4076
Implantable Lead Model: 6947
Implantable Pulse Generator Implant Date: 20240910
Lead Channel Impedance Value: 285 Ohm
Lead Channel Impedance Value: 304 Ohm
Lead Channel Impedance Value: 361 Ohm
Lead Channel Impedance Value: 380 Ohm
Lead Channel Pacing Threshold Amplitude: 0.75 V
Lead Channel Pacing Threshold Amplitude: 0.875 V
Lead Channel Pacing Threshold Pulse Width: 0.4 ms
Lead Channel Pacing Threshold Pulse Width: 0.4 ms
Lead Channel Sensing Intrinsic Amplitude: 0.75 mV
Lead Channel Sensing Intrinsic Amplitude: 0.75 mV
Lead Channel Sensing Intrinsic Amplitude: 4.625 mV
Lead Channel Sensing Intrinsic Amplitude: 4.625 mV
Lead Channel Setting Pacing Amplitude: 2 V
Lead Channel Setting Pacing Amplitude: 2 V
Lead Channel Setting Pacing Pulse Width: 0.4 ms
Lead Channel Setting Sensing Sensitivity: 1.2 mV
Zone Setting Status: 755011
Zone Setting Status: 755011

## 2024-08-16 ENCOUNTER — Ambulatory Visit: Payer: Self-pay | Admitting: Cardiovascular Disease

## 2024-08-24 ENCOUNTER — Ambulatory Visit: Admitting: Cardiovascular Disease

## 2024-08-24 NOTE — Progress Notes (Signed)
 Remote PPM Transmission

## 2024-09-21 ENCOUNTER — Ambulatory Visit

## 2024-11-08 ENCOUNTER — Ambulatory Visit: Admitting: Podiatry

## 2024-11-16 ENCOUNTER — Ambulatory Visit: Admitting: Nurse Practitioner

## 2025-05-31 ENCOUNTER — Encounter: Admitting: Nurse Practitioner
